# Patient Record
Sex: Female | Born: 1940 | Hispanic: No | Marital: Married | Smoking: Former smoker
Health system: Southern US, Community
[De-identification: ages and names within clinical notes are randomized; demographics above are authoritative.]

## PROBLEM LIST (undated history)

## (undated) ENCOUNTER — Emergency Department (HOSPITAL_BASED_OUTPATIENT_CLINIC_OR_DEPARTMENT_OTHER): Admission: EM

## (undated) DIAGNOSIS — J189 Pneumonia, unspecified organism: Secondary | ICD-10-CM

## (undated) DIAGNOSIS — D649 Anemia, unspecified: Secondary | ICD-10-CM

## (undated) DIAGNOSIS — J449 Chronic obstructive pulmonary disease, unspecified: Secondary | ICD-10-CM

## (undated) DIAGNOSIS — C801 Malignant (primary) neoplasm, unspecified: Secondary | ICD-10-CM

## (undated) DIAGNOSIS — F329 Major depressive disorder, single episode, unspecified: Secondary | ICD-10-CM

## (undated) DIAGNOSIS — F419 Anxiety disorder, unspecified: Secondary | ICD-10-CM

## (undated) DIAGNOSIS — I509 Heart failure, unspecified: Secondary | ICD-10-CM

## (undated) DIAGNOSIS — R011 Cardiac murmur, unspecified: Secondary | ICD-10-CM

## (undated) DIAGNOSIS — Z8639 Personal history of other endocrine, nutritional and metabolic disease: Secondary | ICD-10-CM

## (undated) DIAGNOSIS — E039 Hypothyroidism, unspecified: Secondary | ICD-10-CM

## (undated) DIAGNOSIS — K5000 Crohn's disease of small intestine without complications: Secondary | ICD-10-CM

## (undated) DIAGNOSIS — I10 Essential (primary) hypertension: Secondary | ICD-10-CM

## (undated) DIAGNOSIS — I499 Cardiac arrhythmia, unspecified: Secondary | ICD-10-CM

## (undated) DIAGNOSIS — M24451 Recurrent dislocation, right hip: Secondary | ICD-10-CM

## (undated) DIAGNOSIS — M1990 Unspecified osteoarthritis, unspecified site: Secondary | ICD-10-CM

## (undated) DIAGNOSIS — E538 Deficiency of other specified B group vitamins: Secondary | ICD-10-CM

## (undated) HISTORY — DX: Recurrent dislocation, right hip: M24.451

## (undated) HISTORY — DX: Crohn's disease of small intestine without complications: K50.00

## (undated) HISTORY — PX: TUBAL LIGATION: SHX77

## (undated) HISTORY — DX: Deficiency of other specified B group vitamins: E53.8

## (undated) HISTORY — DX: Anxiety disorder, unspecified: F41.9

## (undated) HISTORY — PX: JOINT REPLACEMENT: SHX530

## (undated) HISTORY — PX: TONSILLECTOMY: SUR1361

## (undated) HISTORY — DX: Chronic obstructive pulmonary disease, unspecified: J44.9

## (undated) HISTORY — DX: Major depressive disorder, single episode, unspecified: F32.9

## (undated) HISTORY — DX: Personal history of other endocrine, nutritional and metabolic disease: Z86.39

## (undated) HISTORY — DX: Anemia, unspecified: D64.9

---

## 2008-08-04 DIAGNOSIS — Z8639 Personal history of other endocrine, nutritional and metabolic disease: Secondary | ICD-10-CM

## 2008-08-04 HISTORY — DX: Personal history of other endocrine, nutritional and metabolic disease: Z86.39

## 2009-02-19 DIAGNOSIS — K5000 Crohn's disease of small intestine without complications: Secondary | ICD-10-CM

## 2009-02-19 HISTORY — DX: Crohn's disease of small intestine without complications: K50.00

## 2013-10-17 DIAGNOSIS — F32A Depression, unspecified: Secondary | ICD-10-CM

## 2013-10-17 HISTORY — DX: Depression, unspecified: F32.A

## 2015-05-18 DIAGNOSIS — E538 Deficiency of other specified B group vitamins: Secondary | ICD-10-CM

## 2015-05-18 HISTORY — DX: Deficiency of other specified B group vitamins: E53.8

## 2016-06-03 HISTORY — PX: LAPAROSCOPY: SHX197

## 2016-07-13 ENCOUNTER — Ambulatory Visit (INDEPENDENT_AMBULATORY_CARE_PROVIDER_SITE_OTHER): Payer: Medicare Other | Admitting: Family Medicine

## 2016-07-13 ENCOUNTER — Encounter: Payer: Self-pay | Admitting: Family Medicine

## 2016-07-13 VITALS — BP 140/70 | HR 115 | Resp 12 | Ht 61.25 in | Wt 97.2 lb

## 2016-07-13 DIAGNOSIS — K922 Gastrointestinal hemorrhage, unspecified: Secondary | ICD-10-CM | POA: Diagnosis not present

## 2016-07-13 DIAGNOSIS — R109 Unspecified abdominal pain: Secondary | ICD-10-CM

## 2016-07-13 DIAGNOSIS — G894 Chronic pain syndrome: Secondary | ICD-10-CM

## 2016-07-13 DIAGNOSIS — F17200 Nicotine dependence, unspecified, uncomplicated: Secondary | ICD-10-CM | POA: Insufficient documentation

## 2016-07-13 DIAGNOSIS — K279 Peptic ulcer, site unspecified, unspecified as acute or chronic, without hemorrhage or perforation: Secondary | ICD-10-CM | POA: Diagnosis not present

## 2016-07-13 DIAGNOSIS — J449 Chronic obstructive pulmonary disease, unspecified: Secondary | ICD-10-CM

## 2016-07-13 DIAGNOSIS — F419 Anxiety disorder, unspecified: Secondary | ICD-10-CM

## 2016-07-13 DIAGNOSIS — G621 Alcoholic polyneuropathy: Secondary | ICD-10-CM

## 2016-07-13 DIAGNOSIS — F17218 Nicotine dependence, cigarettes, with other nicotine-induced disorders: Secondary | ICD-10-CM

## 2016-07-13 HISTORY — DX: Nicotine dependence, unspecified, uncomplicated: F17.200

## 2016-07-13 HISTORY — DX: Peptic ulcer, site unspecified, unspecified as acute or chronic, without hemorrhage or perforation: K27.9

## 2016-07-13 HISTORY — DX: Anxiety disorder, unspecified: F41.9

## 2016-07-13 HISTORY — DX: Chronic obstructive pulmonary disease, unspecified: J44.9

## 2016-07-13 LAB — CBC
HEMATOCRIT: 28.1 % — AB (ref 36.0–46.0)
HEMOGLOBIN: 9.6 g/dL — AB (ref 12.0–15.0)
MCHC: 34.2 g/dL (ref 30.0–36.0)
MCV: 102 fl — ABNORMAL HIGH (ref 78.0–100.0)
Platelets: 269 10*3/uL (ref 150.0–400.0)
RBC: 2.76 Mil/uL — AB (ref 3.87–5.11)
RDW: 17 % — ABNORMAL HIGH (ref 11.5–15.5)
WBC: 6.1 10*3/uL (ref 4.0–10.5)

## 2016-07-13 LAB — COMPREHENSIVE METABOLIC PANEL
ALBUMIN: 3.7 g/dL (ref 3.5–5.2)
ALK PHOS: 73 U/L (ref 39–117)
ALT: 11 U/L (ref 0–35)
AST: 23 U/L (ref 0–37)
BILIRUBIN TOTAL: 0.3 mg/dL (ref 0.2–1.2)
BUN: 11 mg/dL (ref 6–23)
CO2: 27 mEq/L (ref 19–32)
Calcium: 8.9 mg/dL (ref 8.4–10.5)
Chloride: 105 mEq/L (ref 96–112)
Creatinine, Ser: 0.7 mg/dL (ref 0.40–1.20)
GFR: 86.37 mL/min (ref >60.00)
Glucose, Bld: 115 mg/dL — ABNORMAL HIGH (ref 70–99)
POTASSIUM: 4.4 meq/L (ref 3.5–5.1)
Sodium: 139 mEq/L (ref 135–145)
TOTAL PROTEIN: 6.3 g/dL (ref 6.0–8.3)

## 2016-07-13 MED ORDER — HYDROCODONE-ACETAMINOPHEN 5-325 MG PO TABS: 1.0000 | ORAL_TABLET | Freq: Every evening | ORAL | 0 refills | Status: DC | PRN
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
## 2016-07-13 NOTE — Patient Instructions (Signed)
A few things to remember from today's visit:   Upper GI bleed - Plan: CBC, Comprehensive metabolic panel, Ambulatory referral to Gastroenterology, HYDROcodone-acetaminophen (NORCO/VICODIN) 5-325 MG tablet  Abdominal pain, unspecified abdominal location - Plan: Comprehensive metabolic panel, HYDROcodone-acetaminophen (NORCO/VICODIN) 5-325 MG tablet  Fall precautions because unstable walk and medication.    Follow a bland diet for the next few days, small meals, adequate hydration.   GET HELP RIGHT AWAY IF:   The pain is does not go away within 2 hours.  Sudden severe/worsening pain.  You keep throwing up (vomiting).  The pain changes and is only in the right or left part of the belly.  Not being able to pass gas or poop.  You have bloody or tarry looking poop.   MAKE SURE YOU:   Understand these instructions.  Will watch your condition.  Will get help right away if you are not doing well or get worse.   If symptoms are persistent please arrange a follow up appointment.     Please be sure medication list is accurate. If a new problem present, please set up appointment sooner than planned today.

## 2016-07-14 ENCOUNTER — Encounter: Payer: Self-pay | Admitting: Gastroenterology

## 2016-07-17 ENCOUNTER — Encounter: Payer: Self-pay | Admitting: Family Medicine

## 2016-07-17 DIAGNOSIS — G894 Chronic pain syndrome: Secondary | ICD-10-CM | POA: Insufficient documentation

## 2016-07-17 DIAGNOSIS — G621 Alcoholic polyneuropathy: Secondary | ICD-10-CM | POA: Insufficient documentation

## 2016-07-17 HISTORY — DX: Chronic pain syndrome: G89.4

## 2016-07-26 NOTE — Progress Notes (Signed)
HPI:   Kelly [name] is a 76 y.o. female, who is here today to follow on recent OV.   She was seen on 07/13/16, when she was c/o severe RUQ pain that seemed to be radiated from and to right low back.  After reviewing records I did referred her to pain clinic. She has Hx of chronic pain and has been on chronic opioid treatment. She has not received information about pain management appt.  Hx of depression and alcoholisms.  Last OV she was reporting melena. GI referral placed.She has an appt in 08/2016 but calling daily to see if there is any cancellation. She had melena until 07/18/16. Diarrhea resolved. Having formed stools , small,2-3 times per day.  Denies fever,chills,nausea, vomiting, or red blood in stool. She is not taking iron supplementation.   Lab Results  Component Value Date   WBC 6.1 07/13/2016   HGB 9.6 (L) 07/13/2016   HCT 28.1 (L) 07/13/2016   MCV 102.0 (H) 07/13/2016   PLT 269.0 07/13/2016   Denies headache, visual changes, chest pain, dyspnea, palpitation,focal weakness.  Abdominal pain, RUQ, now 5/10 when she takes Hydrocodone-Acetaminophen, which she is taking at bedtime. She is eating better and has noted some wt. She is cooking some and playing with her grandchildren. Still taking Gabapentin 300 mg up to 5 caps daily. Denies side effects from medications.    She stopped alcohol intake. Continues smoking, trying to quit but she feels like "it is very hard" to do so.She is still using nicotine patches.  Overall she is feeling better.  Today she is c/o LE edema, which has been intermittently for at least a year. Ankle achy pain. It seems to be "a little" better in the morning and worse I the afternoon. She denies decreased urine output, gross hematuria,foam in urine, orthopnea,or PND.  She has tried soaking feet in warm water with epsom salt.    Lab Results  Component Value Date   CREATININE 0.70 07/13/2016   BUN 11 07/13/2016   NA  139 07/13/2016   K 4.4 07/13/2016   CL 105 07/13/2016   CO2 27 07/13/2016   Lab Results  Component Value Date   ALT 11 07/13/2016   AST 23 07/13/2016   ALKPHOS 73 07/13/2016   BILITOT 0.3 07/13/2016    Review of Systems  Constitutional: Positive for fatigue (improved.). Negative for chills, diaphoresis and fever.  HENT: Negative for facial swelling, mouth sores, nosebleeds, sore throat and trouble swallowing.   Respiratory: Negative for cough, shortness of breath and wheezing.   Cardiovascular: Positive for leg swelling. Negative for chest pain and palpitations.  Gastrointestinal: Positive for abdominal pain. Negative for blood in stool, nausea and vomiting.  Genitourinary: Negative for decreased urine volume, dysuria and hematuria.  Musculoskeletal: Positive for back pain and gait problem.  Neurological: Negative for syncope, weakness and headaches.  Hematological: Negative for adenopathy. Does not bruise/bleed easily.  Psychiatric/Behavioral: Negative for confusion. The patient is nervous/anxious.       Current Outpatient Prescriptions on File Prior to Visit  Medication Sig Dispense Refill  . atorvastatin (LIPITOR) 20 MG tablet Take 20 mg by mouth daily.    Marland Kitchen b complex vitamins tablet Take 1 tablet by mouth daily.    . Cholecalciferol (VITAMIN D3) 2000 units TABS Take 1 tablet by mouth daily.    . Multiple Vitamin (MULTIVITAMIN) tablet Take 1 tablet by mouth daily.    . nicotine (NICODERM CQ - DOSED IN MG/24 HOURS)  14 mg/24hr patch Place 14 mg onto the skin daily.    Marland Kitchen PARoxetine (PAXIL) 20 MG tablet Take 20 mg by mouth daily.     No current facility-administered medications on file prior to visit.      Past Medical History:  Diagnosis Date  . Anemia   . Anxiety   . B12 deficiency 05/2015   B12 was 184  . COPD (chronic obstructive pulmonary disease) ([place])   . Crohn's disease, small intestine ([place]) 02/19/2009  . Depression 10/2013   chronic recurrent major  depressive disorder  . H/O vitamin D deficiency 08/04/2008  . Recurrent dislocation of hip, right    Allergies  Allergen Reactions  . Bactrim [Sulfamethoxazole-Trimethoprim]   . Bee Venom     Social History   Social History  . Marital status: Married    Spouse name: N/A  . Number of children: N/A  . Years of education: N/A   Social History Main Topics  . Smoking status: Current Every Day Smoker  . Smokeless tobacco: Never Used  . Alcohol use Yes  . Drug use: No  . Sexual activity: Not Currently   Other Topics Concern  . None   Social History Narrative  . None    Vitals:   07/27/16 1203  BP: 120/74  Pulse: 94  Resp: 12  O2 sat at RA 96% Body mass index is 19.7 kg/m. Wt Readings from Last 3 Encounters:  07/27/16 105 lb 2 oz (47.7 kg)  07/13/16 97 lb 4 oz (44.1 kg)     Physical Exam  Nursing note and vitals reviewed. Constitutional: She is oriented to person, place, and time. She appears well-developed. No distress.  HENT:  Head: Atraumatic.  Mouth/Throat: Uvula is midline, oropharynx is clear and moist and mucous membranes are normal. Abnormal dentition.  Eyes: Conjunctivae and EOM are normal. Pupils are equal, round, and reactive to light.  Cardiovascular: Regular rhythm.  Tachycardia present.   Murmur (SEM II/VI RUSB > LUSB) heard. Pulses:      Dorsalis pedis pulses are 2+ on the right side, and 2+ on the left side.  Respiratory: Effort normal and breath sounds normal. No respiratory distress.  GI: Soft. She exhibits no distension, no ascites and no mass. There is no hepatomegaly. There is tenderness in the right upper quadrant, epigastric area, periumbilical area and left upper quadrant. There is no rigidity, no rebound and no guarding.  Musculoskeletal: She exhibits edema (2+ pitting LE edema, 3+ pedal pitting edema,bilateral.).  Lymphadenopathy:    She has no cervical adenopathy.  Neurological: She is alert and oriented to person, place, and time. She  has normal strength.  Mildly unstable gait with no assistance.  Skin: Skin is warm. No rash noted. No erythema.  Psychiatric: Her mood appears anxious. She expresses no suicidal ideation.  Poor groomed, good eye contact.    ASSESSMENT AND PLAN:   Ms. Jonica was seen today for follow-up.  Diagnoses and all orders for this visit:  PUD (peptic ulcer disease)  Instructed to keep appt with GI. No changes in Omeprazole. GERD precautions to continue.  -     omeprazole (PRILOSEC) 20 MG capsule; Take 1 capsule (20 mg total) by mouth 2 (two) times daily before a meal.  Upper GI bleed  She is not longer having melenas. Continue PPI. Avoid NSAID's. Instructed about warning signs.  -     CBC  Abdominal pain, unspecified abdominal location  Improved with Hydrocodone-Acetaminophen, educated about side effects. On examination she  still has moderate pain on flanks and upper abdomen, no signs of acute abdomen. LUQ and flank pain are chronic. Pain management appt is pending. She will continue Hydrocodone-Acetaminophen 5-325 mg at bedtime and recommend trying q 2 days. Keep appt with GI.  Instructed about warning signs.  -     HYDROcodone-acetaminophen (NORCO/VICODIN) 5-325 MG tablet; Take 1 tablet by mouth at bedtime as needed for moderate pain.  Bilateral lower extremity edema  Possible etiologies discussed: Vein disease, cardiac,renal,med side effects,and hypoproteinemia among some. She denies dyspnea,orthopnea,or PND. Renal function in normal range last OV and well as protein level. ? Venous etiology, warm water may aggravate problem. Recommend elevation of LE above heart level a few times per day,low salt diet,and compression stoking. Skin care. Instructed about warning signs.   At this time I am still waiting for records. Not aware of Hx of valvular disease and not asymptomatic in this regard, may recommend Echo if I still do not have records next OV. Next OV B12, BMP, and  CBC.      Betty G. Martinique, MD  [employer] [place] office.

## 2016-07-27 ENCOUNTER — Ambulatory Visit (INDEPENDENT_AMBULATORY_CARE_PROVIDER_SITE_OTHER): Payer: Medicare Other | Admitting: Family Medicine

## 2016-07-27 ENCOUNTER — Encounter: Payer: Self-pay | Admitting: Family Medicine

## 2016-07-27 VITALS — BP 120/74 | HR 94 | Resp 12 | Ht 61.25 in | Wt 105.1 lb

## 2016-07-27 DIAGNOSIS — R109 Unspecified abdominal pain: Secondary | ICD-10-CM | POA: Diagnosis not present

## 2016-07-27 DIAGNOSIS — K922 Gastrointestinal hemorrhage, unspecified: Secondary | ICD-10-CM

## 2016-07-27 DIAGNOSIS — G894 Chronic pain syndrome: Secondary | ICD-10-CM

## 2016-07-27 DIAGNOSIS — R600 Localized edema: Secondary | ICD-10-CM

## 2016-07-27 DIAGNOSIS — K279 Peptic ulcer, site unspecified, unspecified as acute or chronic, without hemorrhage or perforation: Secondary | ICD-10-CM | POA: Diagnosis not present

## 2016-07-27 LAB — CBC
HEMATOCRIT: 27.1 % — AB (ref 36.0–46.0)
Hemoglobin: 9 g/dL — ABNORMAL LOW (ref 12.0–15.0)
MCHC: 33.2 g/dL (ref 30.0–36.0)
MCV: 99.5 fl (ref 78.0–100.0)
Platelets: 368 10*3/uL (ref 150.0–400.0)
RBC: 2.72 Mil/uL — ABNORMAL LOW (ref 3.87–5.11)
RDW: 16.8 % — AB (ref 11.5–15.5)
WBC: 6.6 10*3/uL (ref 4.0–10.5)

## 2016-07-27 MED ORDER — OMEPRAZOLE 20 MG PO CPDR: 20.0000 mg | DELAYED_RELEASE_CAPSULE | Freq: Two times a day (BID) | ORAL | 2 refills | Status: DC

## 2016-07-27 MED ORDER — HYDROCODONE-ACETAMINOPHEN 5-325 MG PO TABS: 1.0000 | ORAL_TABLET | Freq: Every evening | ORAL | 0 refills | Status: AC | PRN

## 2016-07-27 NOTE — Patient Instructions (Addendum)
A few things to remember from today's visit:   Chronic pain disorder  PUD (peptic ulcer disease)  Upper GI bleed - Plan: HYDROcodone-acetaminophen (NORCO/VICODIN) 5-325 MG tablet  Abdominal pain, unspecified abdominal location - Plan: HYDROcodone-acetaminophen (NORCO/VICODIN) 5-325 MG tablet  Bilateral lower extremity edema  Continue Omeprazole for now. Keep appt with gastro.  Pending evaluation by pain management. Try to take Hydrocodone q 2 days.  Avoid Ibuprofen or alive or Motrin because can increase risk of bleeding.   Swelling of legs can be related to vein disease. Vein disease is a condition that can affect the veins in the legs. It can cause leg pain, varicose veins, swollen legs, or open sores. Varicose veins are swollen and twisted veins. Things that may help: leg exercises (ankle flexion, walking),compression stocking, OTC horse chestnut seed extract 300 mg twice daily,if  itchy skin cortisone and moisturizers.  Compression stockings- [facility] in [place]  Please be sure medication list is accurate. If a new problem present, please set up appointment sooner than planned today.

## 2016-08-02 ENCOUNTER — Ambulatory Visit (INDEPENDENT_AMBULATORY_CARE_PROVIDER_SITE_OTHER): Payer: Medicare Other | Admitting: Family Medicine

## 2016-08-02 ENCOUNTER — Encounter: Payer: Self-pay | Admitting: Family Medicine

## 2016-08-02 VITALS — BP 126/70 | HR 101 | Resp 16 | Ht 61.25 in | Wt 109.2 lb

## 2016-08-02 DIAGNOSIS — R351 Nocturia: Secondary | ICD-10-CM | POA: Diagnosis not present

## 2016-08-02 DIAGNOSIS — R600 Localized edema: Secondary | ICD-10-CM | POA: Diagnosis not present

## 2016-08-02 DIAGNOSIS — R011 Cardiac murmur, unspecified: Secondary | ICD-10-CM

## 2016-08-02 MED ORDER — FERROUS SULFATE 325 (65 FE) MG PO TBEC: 325.0000 mg | DELAYED_RELEASE_TABLET | Freq: Every day | ORAL | 1 refills | Status: AC

## 2016-08-02 MED ORDER — FUROSEMIDE 20 MG PO TABS: 20.0000 mg | ORAL_TABLET | Freq: Every day | ORAL | 1 refills | Status: DC

## 2016-08-02 NOTE — Progress Notes (Signed)
ACUTE VISIT   HPI:  Chief Complaint  Patient presents with  . Leg Swelling    [name] is a 76 y.o. female, who is here today complaining of persistent lower extremity edema, she is soaking lower extremities in ice water and elevating them a few times during the day but it doesn't seem to be helping. She has hx of intermittent LE edema, seems to be worse since she moved to [place]. She denies legs erythema or calf localized pain. She has bilateral LE pain, burning like sensation.  She is also reporting nocturia, 6 times at night for the past 3-4 days, this is a new symptom for her. She doesn't have any urinary symptoms or frequency during the day.   Other  This is a chronic problem. The problem occurs intermittently. The problem has been unchanged. Associated symptoms include abdominal pain (stable.), arthralgias and fatigue. Pertinent negatives include no change in bowel habit, chest pain, chills, coughing, diaphoresis, fever, headaches, nausea, rash, sore throat, urinary symptoms, vomiting or weakness. The symptoms are aggravated by walking and standing. Treatments tried: elevation of LE's. The treatment provided no relief.   Lab Results  Component Value Date   CREATININE 0.70 07/13/2016   BUN 11 07/13/2016   NA 139 07/13/2016   K 4.4 07/13/2016   CL 105 07/13/2016   CO2 27 07/13/2016   I have heard heart murmur since her first visit, she reports prior Hx but does not recall having a echo before.  She denies fever,chills, chest pain, dyspnea, palpitations, orthopnea and PND.    Review of Systems  Constitutional: Positive for fatigue. Negative for chills, diaphoresis and fever.  HENT: Negative for facial swelling, mouth sores, nosebleeds, sore throat and trouble swallowing.   Respiratory: Negative for cough, shortness of breath and wheezing.   Cardiovascular: Positive for leg swelling. Negative for chest pain and palpitations.  Gastrointestinal:  Positive for abdominal pain (stable.). Negative for change in bowel habit, nausea and vomiting.  Endocrine: Negative for cold intolerance and heat intolerance.  Genitourinary: Negative for decreased urine volume, dysuria and hematuria.  Musculoskeletal: Positive for arthralgias, back pain and gait problem.  Skin: Negative for rash.  Neurological: Negative for syncope, weakness and headaches.      Current Outpatient Prescriptions on File Prior to Visit  Medication Sig Dispense Refill  . atorvastatin (LIPITOR) 20 MG tablet Take 20 mg by mouth daily.    Marland Kitchen b complex vitamins tablet Take 1 tablet by mouth daily.    . Cholecalciferol (VITAMIN D3) 2000 units TABS Take 1 tablet by mouth daily.    Marland Kitchen gabapentin (NEURONTIN) 300 MG capsule Take 300 mg by mouth 5 (five) times daily.    Marland Kitchen HYDROcodone-acetaminophen (NORCO/VICODIN) 5-325 MG tablet Take 1 tablet by mouth at bedtime as needed for moderate pain. 30 tablet 0  . Multiple Vitamin (MULTIVITAMIN) tablet Take 1 tablet by mouth daily.    . nicotine (NICODERM CQ - DOSED IN MG/24 HOURS) 14 mg/24hr patch Place 14 mg onto the skin daily.    Marland Kitchen omeprazole (PRILOSEC) 20 MG capsule Take 1 capsule (20 mg total) by mouth 2 (two) times daily before a meal. 60 capsule 2  . PARoxetine (PAXIL) 20 MG tablet Take 20 mg by mouth daily.     No current facility-administered medications on file prior to visit.      Past Medical History:  Diagnosis Date  . Anemia   . Anxiety   . B12 deficiency 05/2015  B12 was 184  . COPD (chronic obstructive pulmonary disease) ([place])   . Crohn's disease, small intestine ([place]) 02/19/2009  . Depression 10/2013   chronic recurrent major depressive disorder  . H/O vitamin D deficiency 08/04/2008  . Recurrent dislocation of hip, right    Allergies  Allergen Reactions  . Bactrim [Sulfamethoxazole-Trimethoprim]   . Bee Venom     Social History   Social History  . Marital status: Married    Spouse name: N/A  . Number of  children: N/A  . Years of education: N/A   Social History Main Topics  . Smoking status: Current Every Day Smoker  . Smokeless tobacco: Never Used  . Alcohol use Yes  . Drug use: No  . Sexual activity: Not Currently   Other Topics Concern  . None   Social History Narrative  . None    Vitals:   08/02/16 1446  BP: 126/70  Pulse: (!) 101  Resp: 16  O2 sat at RA 96% Body mass index is 20.47 kg/m.   Physical Exam  Nursing note and vitals reviewed. Constitutional: She is oriented to person, place, and time. She appears well-developed. No distress.  HENT:  Head: Normocephalic and atraumatic.  Mouth/Throat: Oropharynx is clear and moist and mucous membranes are normal.  Poor dentition.  Eyes: Pupils are equal, round, and reactive to light. Conjunctivae are normal.  Neck: No JVD present.  Cardiovascular: Regular rhythm.  Tachycardia present.   Murmur (SEM II-II RUSB and LUSB.) heard. Pulses:      Dorsalis pedis pulses are 2+ on the right side, and 2+ on the left side.  Holman's sign negative bilateral.  Respiratory: Effort normal and breath sounds normal. No respiratory distress.  GI: Soft. She exhibits no mass. There is no hepatomegaly. There is tenderness. There is no rebound and no guarding.  Musculoskeletal: She exhibits edema (2+ pedal and pretibial LE edema, bilateral,symmetric.) and tenderness (Upon pressing pretibial area.).  Lymphadenopathy:    She has no cervical adenopathy.  Neurological: She is alert and oriented to person, place, and time. She has normal strength. Coordination normal.  Otherwise stable gait with no assistance.  Skin: Skin is warm. No rash noted. No erythema.  Psychiatric: Her mood appears anxious.  Appropriately groomed, good eye contact.    ASSESSMENT AND PLAN:   Ms. Kelly [name] was seen today for leg swelling.  Diagnoses and all orders for this visit:  Bilateral lower extremity edema  We reviewed possible etiologies, because new onset  nocturia BNP was ordered today. ? Vein related. Some side effects of furosemide discussed. Instructed about warning signs. Follow-up in 2 weeks.  -     B Nat Peptide -     furosemide (LASIX) 20 MG tablet; Take 1 tablet (20 mg total) by mouth daily. -     ECHOCARDIOGRAM COMPLETE; Future -     TSH  Heart murmur  Reporting no symptoms and no prior work-up. I have not received records from former PCP. EKG today SR, normal axis, atrial enlargement. I'll have a prior EKG for comparison. Echo will be arranged.  -     EKG 12-Lead -     B Nat Peptide -     ECHOCARDIOGRAM COMPLETE; Future  Nocturia  No other urinary symptom, possible causes discussed.  I do not think U/A is necessary today since she is not having other urinary symptoms including dysuria and gross hematuria.   -     B Nat Peptide -  ECHOCARDIOGRAM COMPLETE; Future  Other orders  We discussed recent CBC, we have not been able to reach her by phone.  -     ferrous sulfate 325 (65 FE) MG EC tablet; Take 1 tablet (325 mg total) by mouth daily with breakfast.    -[name] was advised to seek immediate medical attention if sudden worsening symptoms or to follow sooner if they persist or if new concerns arise.       Kelly [name] G. Martinique, MD  [facility]. [place] office.

## 2016-08-02 NOTE — Patient Instructions (Addendum)
A few things to remember from today's visit:   Heart murmur - Plan: EKG 12-Lead, B Nat Peptide, ECHOCARDIOGRAM COMPLETE  Bilateral lower extremity edema - Plan: B Nat Peptide, furosemide (LASIX) 20 MG tablet, ECHOCARDIOGRAM COMPLETE  Nocturia - Plan: B Nat Peptide, ECHOCARDIOGRAM COMPLETE  Take 2 tabs daily of Furosemide for 4-5 days then one daily.  Eat food that has potassium.   Please be sure medication list is accurate. If a new problem present, please set up appointment sooner than planned today.

## 2016-08-03 LAB — BRAIN NATRIURETIC PEPTIDE: Pro B Natriuretic peptide (BNP): 331 pg/mL — ABNORMAL HIGH (ref 0.0–100.0)

## 2016-08-03 LAB — TSH: TSH: 5.3 u[IU]/mL — ABNORMAL HIGH (ref 0.35–4.50)

## 2016-08-15 DIAGNOSIS — D509 Iron deficiency anemia, unspecified: Secondary | ICD-10-CM

## 2016-08-15 DIAGNOSIS — E538 Deficiency of other specified B group vitamins: Secondary | ICD-10-CM | POA: Insufficient documentation

## 2016-08-15 HISTORY — DX: Iron deficiency anemia, unspecified: D50.9

## 2016-08-15 NOTE — Progress Notes (Addendum)
HPI:   [name] is a 77 y.o. female, who is here today to follow on recent OV.   She was seen on 08/02/16, when she was c/o worsening LE edema. Thought to be venous in etiology. She is on Furosemide 20 mg daily, which she has tolerated well except for increased urinary frequency and urgency.  Last she denied dyspnea, today she states that she has been helping more around her daughter's house, so she has noted some exertional dyspnea and has needed her albuterol inhaler a few times during the past week. She denies associated chest pain, diaphoresis, dizziness, palpitations, or syncope. She denies orthopnea or PND. Hx of COPD, she is on Arnuity Ellipta 200 mcg daily prn.  Lower extremity edema has improved but still having pain. She has not noted erythema or skin ulcers.   Echo was ordered but she missed call for appointment information.  08/03/2015 BNP mildly above normal range, 331.   + Smoker.  Lab Results  Component Value Date   CREATININE 0.70 07/13/2016   BUN 11 07/13/2016   NA 139 07/13/2016   K 4.4 07/13/2016   CL 105 07/13/2016   CO2 27 07/13/2016   TSH mildly abnormal. + Fatigue, chronic.   Lab Results  Component Value Date   TSH 5.30 (H) 08/02/2016    Hx of iron deficiency anemia and B12 deficiency. She is on Fe Sulfate 325 mg daily. She is no longer having melena, she has not noted blood in stool.  Abdominal pain is stable.  Lab Results  Component Value Date   WBC 6.6 07/27/2016   HGB 9.0 (L) 07/27/2016   HCT 27.1 (L) 07/27/2016   MCV 99.5 07/27/2016   PLT 368.0 07/27/2016   She also has Hx of B12 deficiency. 05/2015 B12 187. She is on B complex vit.  She denies history of alcohol abuse, which was listed on some of her records. She is drinking vodka, about 1 oz daily.   Review of Systems  Constitutional: Positive for fatigue. Negative for activity change, appetite change and fever.  HENT: Negative for mouth sores, nosebleeds  and trouble swallowing.   Respiratory: Positive for shortness of breath. Negative for cough and wheezing.   Cardiovascular: Positive for leg swelling. Negative for chest pain and palpitations.  Gastrointestinal: Positive for abdominal pain. Negative for blood in stool, diarrhea, nausea and vomiting.  Endocrine: Negative for cold intolerance and heat intolerance.  Genitourinary: Negative for decreased urine volume, dysuria and hematuria.  Musculoskeletal: Negative for gait problem.  Skin: Negative for rash.  Neurological: Negative for syncope, weakness and headaches.  Hematological: Negative for adenopathy. Does not bruise/bleed easily.  Psychiatric/Behavioral: Negative for confusion. The patient is nervous/anxious.     Current Outpatient Prescriptions on File Prior to Visit  Medication Sig Dispense Refill  . atorvastatin (LIPITOR) 20 MG tablet Take 20 mg by mouth daily.    Marland Kitchen b complex vitamins tablet Take 1 tablet by mouth daily.    . Cholecalciferol (VITAMIN D3) 2000 units TABS Take 1 tablet by mouth daily.    . ferrous sulfate 325 (65 FE) MG EC tablet Take 1 tablet (325 mg total) by mouth daily with breakfast. 90 tablet 1  . furosemide (LASIX) 20 MG tablet Take 1 tablet (20 mg total) by mouth daily. 30 tablet 1  . gabapentin (NEURONTIN) 300 MG capsule Take 300 mg by mouth 5 (five) times daily.    Marland Kitchen HYDROcodone-acetaminophen (NORCO/VICODIN) 5-325 MG tablet Take 1 tablet by  mouth at bedtime as needed for moderate pain. 30 tablet 0  . Multiple Vitamin (MULTIVITAMIN) tablet Take 1 tablet by mouth daily.    . nicotine (NICODERM CQ - DOSED IN MG/24 HOURS) 14 mg/24hr patch Place 14 mg onto the skin daily.    Marland Kitchen omeprazole (PRILOSEC) 20 MG capsule Take 1 capsule (20 mg total) by mouth 2 (two) times daily before a meal. 60 capsule 2  . PARoxetine (PAXIL) 20 MG tablet Take 20 mg by mouth daily.     No current facility-administered medications on file prior to visit.      Past Medical History:    Diagnosis Date  . Anemia   . Anxiety   . B12 deficiency 05/2015   B12 was 184  . COPD (chronic obstructive pulmonary disease) ([place])   . Crohn's disease, small intestine ([place]) 02/19/2009  . Depression 10/2013   chronic recurrent major depressive disorder  . H/O vitamin D deficiency 08/04/2008  . Recurrent dislocation of hip, right    Allergies  Allergen Reactions  . Bactrim [Sulfamethoxazole-Trimethoprim]   . Bee Venom     Social History   Social History  . Marital status: Married    Spouse name: N/A  . Number of children: N/A  . Years of education: N/A   Social History Main Topics  . Smoking status: Current Every Day Smoker  . Smokeless tobacco: Never Used  . Alcohol use Yes  . Drug use: No  . Sexual activity: Not Currently   Other Topics Concern  . None   Social History Narrative  . None    Vitals:   08/16/16 1012 08/16/16 1042  BP: 118/62   Pulse: (!) 105 100  Resp: 16    Body mass index is 19.14 kg/m.  Wt Readings from Last 3 Encounters:  08/16/16 102 lb 2 oz (46.3 kg)  08/02/16 109 lb 4 oz (49.6 kg)  07/27/16 105 lb 2 oz (47.7 kg)    Physical Exam  Nursing note and vitals reviewed. Constitutional: She is oriented to person, place, and time. She appears well-developed. No distress.  HENT:  Head: Normocephalic and atraumatic.  Mouth/Throat: Oropharynx is clear and moist and mucous membranes are normal.  Eyes: Pupils are equal, round, and reactive to light. Conjunctivae are normal.  Neck: No JVD present.  Cardiovascular: Normal rate and regular rhythm.   Murmur (SEM I-II RUSB and LUSB.) heard. Pulses:      Dorsalis pedis pulses are 2+ on the right side, and 2+ on the left side.  Small varicose veins LE, bilateral   Respiratory: Effort normal. No respiratory distress. She has no wheezes. She has rales (left apex and lower lobe).  GI: Soft. She exhibits no mass. There is no hepatomegaly. There is generalized tenderness. There is no rigidity and  no rebound.  Musculoskeletal: She exhibits edema (1+ pedal and pretibial LE edema, bilateral).  Lymphadenopathy:    She has no cervical adenopathy.  Neurological: She is alert and oriented to person, place, and time. She has normal strength. Gait normal.  Skin: Skin is warm. No rash noted. No erythema.  Psychiatric: Her mood appears anxious.  Appropriately groomed, good eye contact.    ASSESSMENT AND PLAN:   Ms. Sharmel was seen today for follow-up.  Diagnoses and all orders for this visit:  Exertional dyspnea  ? COPD,CHF, anemia, and deconditioning among some discussed today. Instructed about warning signs. Instructed to call and reschedule echo, she has the phone number.  For now she will  continue with Arnuity Ellipta 200 mcg daily and Albuterol inh as needed.  -     Basic metabolic panel -     Brain Natriuretic Peptide  Bilateral lower extremity edema  Improved. For now she will continue Furosemide 20 mg daily. Low salt diet. Lower extremity elevation above heart level and/or compression stockings might also help. Keep appt in 09/2016.  -     Basic metabolic panel  Z61 deficiency  No changes in current management, will follow labs done today and will give further recommendations accordingly.  -     Vitamin B12  Iron deficiency anemia due to chronic blood loss  No changes in Fe Sulfate dose, will follow labs done today and will give further recommendations accordingly.  -     CBC  Chest rales  We discussed possible etiologies, including infectious, CHF, COPD among some. Strongly recommend smoking cessation.  -     DG Chest 2 View; Future  Abnormal TSH  Further recommendations would be given according to lab results.  -     TSH -     T4, free      Betty G. Martinique, MD  [facility]. [place] office.

## 2016-08-16 ENCOUNTER — Ambulatory Visit (INDEPENDENT_AMBULATORY_CARE_PROVIDER_SITE_OTHER): Payer: Medicare Other | Admitting: Family Medicine

## 2016-08-16 ENCOUNTER — Encounter: Payer: Self-pay | Admitting: Family Medicine

## 2016-08-16 VITALS — BP 118/62 | HR 100 | Resp 16 | Ht 61.25 in | Wt 102.1 lb

## 2016-08-16 DIAGNOSIS — R7989 Other specified abnormal findings of blood chemistry: Secondary | ICD-10-CM

## 2016-08-16 DIAGNOSIS — E538 Deficiency of other specified B group vitamins: Secondary | ICD-10-CM

## 2016-08-16 DIAGNOSIS — R0989 Other specified symptoms and signs involving the circulatory and respiratory systems: Secondary | ICD-10-CM

## 2016-08-16 DIAGNOSIS — R0609 Other forms of dyspnea: Secondary | ICD-10-CM | POA: Diagnosis not present

## 2016-08-16 DIAGNOSIS — D500 Iron deficiency anemia secondary to blood loss (chronic): Secondary | ICD-10-CM

## 2016-08-16 DIAGNOSIS — R600 Localized edema: Secondary | ICD-10-CM

## 2016-08-16 DIAGNOSIS — R946 Abnormal results of thyroid function studies: Secondary | ICD-10-CM | POA: Diagnosis not present

## 2016-08-16 LAB — CBC
HEMATOCRIT: 31.9 % — AB (ref 36.0–46.0)
HEMOGLOBIN: 10.3 g/dL — AB (ref 12.0–15.0)
MCHC: 32.3 g/dL (ref 30.0–36.0)
MCV: 99.8 fl (ref 78.0–100.0)
PLATELETS: 363 10*3/uL (ref 150.0–400.0)
RBC: 3.2 Mil/uL — ABNORMAL LOW (ref 3.87–5.11)
RDW: 18.5 % — AB (ref 11.5–15.5)
WBC: 7.2 10*3/uL (ref 4.0–10.5)

## 2016-08-16 LAB — BASIC METABOLIC PANEL
BUN: 12 mg/dL (ref 6–23)
CHLORIDE: 100 meq/L (ref 96–112)
CO2: 31 meq/L (ref 19–32)
Calcium: 8.7 mg/dL (ref 8.4–10.5)
Creatinine, Ser: 0.72 mg/dL (ref 0.40–1.20)
GFR: 83.59 mL/min (ref >60.00)
GLUCOSE: 105 mg/dL — AB (ref 70–99)
POTASSIUM: 3.7 meq/L (ref 3.5–5.1)
SODIUM: 139 meq/L (ref 135–145)

## 2016-08-16 LAB — T4, FREE: Free T4: 0.73 ng/dL (ref 0.60–1.60)

## 2016-08-16 LAB — TSH: TSH: 6.51 u[IU]/mL — ABNORMAL HIGH (ref 0.35–4.50)

## 2016-08-16 LAB — VITAMIN B12: Vitamin B-12: 176 pg/mL — ABNORMAL LOW (ref 211–911)

## 2016-08-16 LAB — BRAIN NATRIURETIC PEPTIDE: PRO B NATRI PEPTIDE: 285 pg/mL — AB (ref 0.0–100.0)

## 2016-08-16 MED ORDER — FLUTICASONE FUROATE 200 MCG/ACT IN AEPB: 1.0000 | INHALATION_SPRAY | Freq: Every day | RESPIRATORY_TRACT | 3 refills | Status: DC

## 2016-08-16 NOTE — Patient Instructions (Signed)
A few things to remember from today's visit:   Bilateral lower extremity edema - Plan: Basic metabolic panel  J88 deficiency - Plan: Vitamin B12  Iron deficiency anemia due to chronic blood loss - Plan: CBC  Chest rales - Plan: DG Chest 2 View  Exertional dyspnea - Plan: Basic metabolic panel, Brain Natriuretic Peptide  Abnormal TSH - Plan: TSH, T4, free  Please re-schedule echo.  Today X ray was ordered.  This can be done at [facility] between 8 am and 5 pm: [place]. [phone].   Please be sure medication list is accurate. If a new problem present, please set up appointment sooner than planned today.

## 2016-08-16 NOTE — Addendum Note (Signed)
Addended by: Martinique, BETTY G on: 08/16/2016 01:52 PM   Modules accepted: Orders

## 2016-08-22 ENCOUNTER — Ambulatory Visit (HOSPITAL_COMMUNITY): Payer: Medicare Other | Attending: Internal Medicine

## 2016-08-22 ENCOUNTER — Other Ambulatory Visit: Payer: Self-pay

## 2016-08-22 DIAGNOSIS — R600 Localized edema: Secondary | ICD-10-CM | POA: Diagnosis present

## 2016-08-22 DIAGNOSIS — F17200 Nicotine dependence, unspecified, uncomplicated: Secondary | ICD-10-CM | POA: Diagnosis not present

## 2016-08-22 DIAGNOSIS — J449 Chronic obstructive pulmonary disease, unspecified: Secondary | ICD-10-CM | POA: Insufficient documentation

## 2016-08-22 DIAGNOSIS — R011 Cardiac murmur, unspecified: Secondary | ICD-10-CM | POA: Insufficient documentation

## 2016-08-22 DIAGNOSIS — R351 Nocturia: Secondary | ICD-10-CM | POA: Diagnosis not present

## 2016-08-25 ENCOUNTER — Telehealth: Payer: Self-pay | Admitting: Family Medicine

## 2016-08-25 NOTE — Telephone Encounter (Signed)
Kelly [name] pt calling to get results

## 2016-08-26 NOTE — Telephone Encounter (Signed)
Left voicemail for patient to call the office back.  See result note.

## 2016-08-30 ENCOUNTER — Other Ambulatory Visit: Payer: Self-pay

## 2016-08-30 ENCOUNTER — Ambulatory Visit (INDEPENDENT_AMBULATORY_CARE_PROVIDER_SITE_OTHER): Payer: Medicare Other | Admitting: Gastroenterology

## 2016-08-30 ENCOUNTER — Encounter: Payer: Self-pay | Admitting: Gastroenterology

## 2016-08-30 ENCOUNTER — Other Ambulatory Visit: Payer: Self-pay | Admitting: Family Medicine

## 2016-08-30 ENCOUNTER — Other Ambulatory Visit: Payer: Medicare Other

## 2016-08-30 VITALS — BP 122/72 | HR 66 | Ht 62.0 in | Wt 104.0 lb

## 2016-08-30 DIAGNOSIS — I517 Cardiomegaly: Secondary | ICD-10-CM

## 2016-08-30 DIAGNOSIS — D519 Vitamin B12 deficiency anemia, unspecified: Secondary | ICD-10-CM | POA: Diagnosis not present

## 2016-08-30 DIAGNOSIS — K269 Duodenal ulcer, unspecified as acute or chronic, without hemorrhage or perforation: Secondary | ICD-10-CM

## 2016-08-30 DIAGNOSIS — R109 Unspecified abdominal pain: Secondary | ICD-10-CM

## 2016-08-30 MED ORDER — LEVOTHYROXINE SODIUM 25 MCG PO TABS: 25.0000 ug | ORAL_TABLET | Freq: Every day | ORAL | 2 refills | Status: DC

## 2016-08-30 NOTE — Patient Instructions (Addendum)
Strict avoidance of NSAIDs.  You will have labs checked today in the basement lab.  Please head down after you check out with the front desk  (h. Pylori serology, blood).  Stay on iron daily for now.  Please return to see Dr. Ardis Hughs in 3 months.  Normal BMI (Body Mass Index- based on height and weight) is between 23 and 30. Your BMI today is Body mass index is 19.02 kg/m. Marland Kitchen Please consider follow up  regarding your BMI with your Primary Care Provider.

## 2016-08-30 NOTE — Progress Notes (Signed)
HPI: This is a  very pleasant 76 year old woman  who was referred to me by Martinique, Betty G, MD  to evaluate  anemia .    Chief complaint is anemia, recent dark stools  She is here with her daughter today   Moved from [place] because of the [place].  Probably going back in 10/2016.  Had perforated ulcer, acute abdominal pains 05/2016.  This led to emergency laparotomy. I do not have the operative report from that surgery. It sounds like her recovery was slow but she spent some time in rehabilitation and eventually was able to go home. She then relocated, possibly temporary, here to [place] from [place]. She had been taking mobic, and PRN ibuprofen.   She is not sure if she was ever tested for H. pylori  She recovered in rehab, had issues with her right hip.  She had black colored stools in late june .  SHe had started taking iron shortly before that.  She never sees red blood in her stool.  Overall her weight is at it's normal, has gained 10-13 pounds since moving here.  No eating problems.  Normal BMs.  No constipation or diarrhea.  She wears a patch to help quit smoking  She drinks vodka daily (will drink a bottle about every 2 weeks). She says this is for pain.  Glass of wine weekly as well.  Her daughter told me that she made an agreement with her mother to not bother her about her alcohol intake as long as she was eating food as well.     Old Data Reviewed:   Colonoscopy July 2014 in [place]. This was done for personal history of polyps. Findings included "patchy discontinuous erosions in the sigmoid and rectum... These findings are compatible with colitis". Biopsies were taken. A single 4 mm polyp was found at the hepatic flexure and it was removed. I do not have any of the pathology reports from this procedure   Blood work June 2018 showed hemoglobin 9.6, MCV 102. Complete metabolic profile was normal. She was started on iron supplements.  Bloodwork July 2018  showed hemoglobin 10.3, MCV 100.  Upper GI May 2018 done while she was hospitalized for perforated duodenum ulcer showed no extravasation.    Review of systems: Pertinent positive and negative review of systems were noted in the above HPI section. All other review negative.   Past Medical History:  Diagnosis Date  . Anemia   . Anxiety   . B12 deficiency 05/2015   B12 was 184  . COPD (chronic obstructive pulmonary disease) ([place])   . Crohn's disease, small intestine ([place]) 02/19/2009  . Depression 10/2013   chronic recurrent major depressive disorder  . H/O vitamin D deficiency 08/04/2008  . Recurrent dislocation of hip, right     Past Surgical History:  Procedure Laterality Date  . JOINT REPLACEMENT     Right hip, 2015  . LAPAROSCOPY  06/03/2016   Duodenal ulcer repair    Current Outpatient Prescriptions  Medication Sig Dispense Refill  . albuterol (VENTOLIN HFA) 108 (90 Base) MCG/ACT inhaler Inhale 1 puff into the lungs every 6 (six) hours as needed for wheezing or shortness of breath.    Marland Kitchen atorvastatin (LIPITOR) 20 MG tablet Take 20 mg by mouth daily.    Marland Kitchen b complex vitamins tablet Take 1 tablet by mouth daily.    . Cholecalciferol (VITAMIN D3) 2000 units TABS Take 1 tablet by mouth daily.    . ferrous  sulfate 325 (65 FE) MG EC tablet Take 1 tablet (325 mg total) by mouth daily with breakfast. 90 tablet 1  . Fluticasone Furoate (ARNUITY ELLIPTA) 200 MCG/ACT AEPB Inhale 1 puff into the lungs daily. 30 each 3  . furosemide (LASIX) 20 MG tablet Take 1 tablet (20 mg total) by mouth daily. 30 tablet 1  . gabapentin (NEURONTIN) 300 MG capsule Take 300 mg by mouth 5 (five) times daily.    Marland Kitchen levothyroxine (SYNTHROID, LEVOTHROID) 25 MCG tablet Take 1 tablet (25 mcg total) by mouth daily before breakfast. 30 tablet 2  . Multiple Vitamin (MULTIVITAMIN) tablet Take 1 tablet by mouth daily.    . nicotine (NICODERM CQ - DOSED IN MG/24 HOURS) 14 mg/24hr patch Place 14 mg onto the skin  daily.    Marland Kitchen omeprazole (PRILOSEC) 20 MG capsule Take 1 capsule (20 mg total) by mouth 2 (two) times daily before a meal. 60 capsule 2  . PARoxetine (PAXIL) 20 MG tablet Take 20 mg by mouth daily.     No current facility-administered medications for this visit.     Allergies as of 08/30/2016 - Review Complete 08/30/2016  Allergen Reaction Noted  . Bactrim [sulfamethoxazole-trimethoprim]  07/13/2016  . Bee venom  07/13/2016    Family History  Problem Relation Age of Onset  . Cancer Neg Hx   . Diabetes Neg Hx     Social History   Social History  . Marital status: Married    Spouse name: N/A  . Number of children: N/A  . Years of education: N/A   Occupational History  . Not on file.   Social History Main Topics  . Smoking status: Current Every Day Smoker  . Smokeless tobacco: Never Used  . Alcohol use Yes  . Drug use: No  . Sexual activity: Not Currently   Other Topics Concern  . Not on file   Social History Narrative  . No narrative on file     Physical Exam: BP 122/72 (BP Location: Right Arm, Patient Position: Sitting, Cuff Size: Normal)   Pulse 66   Ht 5\' 2"  (1.575 m)   Wt 104 lb (47.2 kg)   BMI 19.02 kg/m  Constitutional: generally well-appearing, frail, elderly Psychiatric: alert and oriented x3 Eyes: extraocular movements intact Mouth: oral pharynx moist, no lesions Neck: supple no lymphadenopathy Cardiovascular: heart regular rate and rhythm Lungs: clear to auscultation bilaterally Abdomen: soft, nontender, nondistended, no obvious ascites, no peritoneal signs, normal bowel sounds Extremities: no lower extremity edema bilaterally Skin: no lesions on visible extremities   Assessment and plan: 76 y.o. female with  macrocytic anemia, recent perforated duodenal ulcer in [place]  First it sounds like her ulcer in [place] was related to NSAIDs. They told her to stop meloxicam and never resumed it. She had also been taking once in a while ibuprofen. She  was also recommended to begin taking proton pump inhibitor once or twice daily and to never stop. She is not sure and I cannot tell from her records if she was ever tested for H. pylori. We will do that for her now with serologies. If it is positive I will treat her for H. pylori. Her anemia is macrocytic, I'm not really sure that it indicates chronic GI blood loss. She did have some dark stools but it was after she started taking iron supplements about 2 months ago. She has no obvious overt GI bleeding. I don't think she needs repeat upper endoscopy or colonoscopy at this point. Most  struck by the fact that she drinks vodka daily and she had come to some sort of arrangement with her daughter in which her daughter really ignores her alcohol intake as long as she is eating food. She is eating food well and has actually gained about 12 pounds since moving from [place]. She will continue eating, I did tell her she should probably try to cut back on her alcohol intake which I think is probably a bigger deal for her than she lets on. She'll return to see me in 2-3 months as well.    Please see the "Patient Instructions" section for addition details about the plan.   Owens Loffler, MD [facility] 08/30/2016, 11:33 AM  Cc: Martinique, Betty G, MD

## 2016-09-01 ENCOUNTER — Encounter: Payer: Self-pay | Admitting: Physical Medicine & Rehabilitation

## 2016-09-01 ENCOUNTER — Encounter: Payer: Medicare Other | Attending: Physical Medicine & Rehabilitation | Admitting: Physical Medicine & Rehabilitation

## 2016-09-01 VITALS — BP 111/64 | HR 90 | Resp 16 | Ht 62.0 in | Wt 106.0 lb

## 2016-09-01 DIAGNOSIS — K5090 Crohn's disease, unspecified, without complications: Secondary | ICD-10-CM | POA: Diagnosis present

## 2016-09-01 DIAGNOSIS — G8929 Other chronic pain: Secondary | ICD-10-CM | POA: Diagnosis not present

## 2016-09-01 DIAGNOSIS — J449 Chronic obstructive pulmonary disease, unspecified: Secondary | ICD-10-CM | POA: Diagnosis not present

## 2016-09-01 DIAGNOSIS — M25551 Pain in right hip: Secondary | ICD-10-CM | POA: Diagnosis not present

## 2016-09-01 DIAGNOSIS — F419 Anxiety disorder, unspecified: Secondary | ICD-10-CM | POA: Diagnosis not present

## 2016-09-01 DIAGNOSIS — D649 Anemia, unspecified: Secondary | ICD-10-CM | POA: Diagnosis not present

## 2016-09-01 DIAGNOSIS — F17210 Nicotine dependence, cigarettes, uncomplicated: Secondary | ICD-10-CM | POA: Diagnosis not present

## 2016-09-01 DIAGNOSIS — M791 Myalgia, unspecified site: Secondary | ICD-10-CM

## 2016-09-01 DIAGNOSIS — G479 Sleep disorder, unspecified: Secondary | ICD-10-CM | POA: Diagnosis not present

## 2016-09-01 DIAGNOSIS — F1010 Alcohol abuse, uncomplicated: Secondary | ICD-10-CM | POA: Diagnosis not present

## 2016-09-01 DIAGNOSIS — K269 Duodenal ulcer, unspecified as acute or chronic, without hemorrhage or perforation: Secondary | ICD-10-CM | POA: Insufficient documentation

## 2016-09-01 DIAGNOSIS — F329 Major depressive disorder, single episode, unspecified: Secondary | ICD-10-CM | POA: Diagnosis not present

## 2016-09-01 DIAGNOSIS — R269 Unspecified abnormalities of gait and mobility: Secondary | ICD-10-CM | POA: Diagnosis not present

## 2016-09-01 DIAGNOSIS — M546 Pain in thoracic spine: Secondary | ICD-10-CM | POA: Diagnosis not present

## 2016-09-01 DIAGNOSIS — Z96641 Presence of right artificial hip joint: Secondary | ICD-10-CM | POA: Insufficient documentation

## 2016-09-01 DIAGNOSIS — W19XXXA Unspecified fall, initial encounter: Secondary | ICD-10-CM | POA: Diagnosis not present

## 2016-09-01 DIAGNOSIS — Z720 Tobacco use: Secondary | ICD-10-CM

## 2016-09-01 LAB — H PYLORI, IGM, IGG, IGA AB
H. pylori, IgA Abs: <9 units (ref 0.0–8.9)
H. pylori, IgG AbS: <0.8 Index Value (ref 0.00–0.79)

## 2016-09-01 MED ORDER — GABAPENTIN 600 MG PO TABS: 600.0000 mg | ORAL_TABLET | Freq: Three times a day (TID) | ORAL | 1 refills | Status: DC

## 2016-09-01 MED ORDER — METHOCARBAMOL 750 MG PO TABS: 750.0000 mg | ORAL_TABLET | Freq: Two times a day (BID) | ORAL | 1 refills | Status: DC | PRN

## 2016-09-01 MED ORDER — DICLOFENAC SODIUM 1 % TD GEL: 2.0000 g | Freq: Four times a day (QID) | TRANSDERMAL | 1 refills | Status: DC

## 2016-09-01 MED ORDER — AMITRIPTYLINE HCL 10 MG PO TABS: 10.0000 mg | ORAL_TABLET | Freq: Every day | ORAL | 1 refills | Status: DC
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
## 2016-09-02 ENCOUNTER — Ambulatory Visit: Payer: Medicare Other

## 2016-09-06 ENCOUNTER — Ambulatory Visit (INDEPENDENT_AMBULATORY_CARE_PROVIDER_SITE_OTHER): Payer: Medicare Other

## 2016-09-06 DIAGNOSIS — E538 Deficiency of other specified B group vitamins: Secondary | ICD-10-CM

## 2016-09-06 MED ORDER — CYANOCOBALAMIN 1000 MCG/ML IJ SOLN
1000.0000 ug | Freq: Once | INTRAMUSCULAR | Status: AC
Start: 2016-09-06 — End: 2016-09-06
  Administered 2016-09-06: 1000 ug via INTRAMUSCULAR

## 2016-09-13 ENCOUNTER — Ambulatory Visit (INDEPENDENT_AMBULATORY_CARE_PROVIDER_SITE_OTHER): Payer: Medicare Other

## 2016-09-13 DIAGNOSIS — E538 Deficiency of other specified B group vitamins: Secondary | ICD-10-CM

## 2016-09-13 MED ORDER — CYANOCOBALAMIN 1000 MCG/ML IJ SOLN
1000.0000 ug | Freq: Once | INTRAMUSCULAR | Status: AC
  Administered 2016-09-13: 1000 ug via INTRAMUSCULAR

## 2016-09-13 NOTE — Progress Notes (Signed)
Pt came for her b12 injection. She tolerated her injection well

## 2016-09-16 ENCOUNTER — Ambulatory Visit (INDEPENDENT_AMBULATORY_CARE_PROVIDER_SITE_OTHER)
Admission: RE | Admit: 2016-09-16 | Discharge: 2016-09-16 | Disposition: A | Discharge status: Home / Self Care | Payer: Medicare Other | Source: Ambulatory Visit | Attending: Family Medicine | Admitting: Family Medicine

## 2016-09-16 DIAGNOSIS — R0989 Other specified symptoms and signs involving the circulatory and respiratory systems: Secondary | ICD-10-CM

## 2016-09-23 ENCOUNTER — Ambulatory Visit (INDEPENDENT_AMBULATORY_CARE_PROVIDER_SITE_OTHER): Payer: Medicare Other

## 2016-09-23 DIAGNOSIS — E538 Deficiency of other specified B group vitamins: Secondary | ICD-10-CM

## 2016-09-23 MED ORDER — CYANOCOBALAMIN 1000 MCG/ML IJ SOLN
1000.0000 ug | Freq: Once | INTRAMUSCULAR | Status: AC
  Administered 2016-09-23: 1000 ug via INTRAMUSCULAR

## 2016-09-26 DIAGNOSIS — E039 Hypothyroidism, unspecified: Secondary | ICD-10-CM

## 2016-09-26 DIAGNOSIS — I517 Cardiomegaly: Secondary | ICD-10-CM | POA: Insufficient documentation

## 2016-09-26 HISTORY — DX: Hypothyroidism, unspecified: E03.9

## 2016-09-26 NOTE — Progress Notes (Signed)
HPI:   Kelly [name] is a 76 y.o. female, who is here today to follow on recent OV.   She was seen on 08/16/16 to follow on exertional dyspnea and edema. Since her last OV she has seen Dr Posey Pronto for pain management and Dr Ardis Hughs for abdominal pain and upper GI bleed.  Lab Results  Component Value Date   WBC 7.2 08/16/2016   HGB 10.3 (L) 08/16/2016   HCT 31.9 (L) 08/16/2016   MCV 99.8 08/16/2016   PLT 363.0 08/16/2016    Echo done on 08/22/16 showed - LVEF 65-70%, severe concentric LVH with cordal SAM, dynamic mid-cavitary gradient of 41 mmHg at rest that increases to 71 mmHg with valsalva. She was referred to cardiologists, she has an appt with Dr Oval Linsey on 09/29/16.  Dyspnea has improved, "not bad", still trying to quit smoking.  She denies chest pain,orthopnea,or PND.  LE edema has also improved, R>L. She denies calves pain or erythema.  She has not tried compression stockings. Denies headache, visual changes, chest pain, palpitation,or focal weakness.  Taking Furosemide 20 mg daily.   B12 deficiency: Started B12 1000 mcg IM, has received 3 injections so far.    Lab Results  Component Value Date   VITAMINB12 176 (L) 08/16/2016   Hypothyroidism:  Currently she is on Levothyroxine 25 mcg daily. . Tolerating medication well, no side effects reported. She has not noted dysphagia,abdominal pain, changes in bowel habits, cold/heat intolerance, or abnormal weight loss.  Lab Results  Component Value Date   TSH 6.51 (H) 08/16/2016   + increased hair loss for about 3 weeks, no scalp lesions of alopecic areas.  Anxiety:  She is on Paxil 20 mg daily, which she has taken for many years. Medication still helps. She is living with her daughter and grandchildren, she disagrees with her life style and how her 27 yo granddaughter behaves. Living with her daughter has aggravated some her anxiety. She denies depressed mood. Thinking about going back to [place] in  11/2016, she misses her house and her husband.  Still drinking vodka but less, also drinks wine with dinner.  She is requesting to have hip X ray that was ordered by Dr Posey Pronto (pain management) at [facility]. She cannot afford X ray at the place that was initially arranged. Gabapentin was increased and she was stated on Amitriptyline 10 mg at night.    Review of Systems  Constitutional: Positive for fatigue. Negative for activity change, appetite change, fever and unexpected weight change.  HENT: Negative for mouth sores, nosebleeds and trouble swallowing.   Eyes: Negative for redness and visual disturbance.  Respiratory: Positive for shortness of breath. Negative for cough and wheezing.   Cardiovascular: Positive for leg swelling. Negative for chest pain and palpitations.  Gastrointestinal: Positive for abdominal pain (stable). Negative for nausea and vomiting.       Negative for changes in bowel habits.  Endocrine: Negative for cold intolerance and heat intolerance.  Genitourinary: Negative for decreased urine volume, dysuria and hematuria.  Musculoskeletal: Positive for arthralgias and back pain.  Skin: Negative for rash and wound.  Neurological: Negative for syncope, weakness and headaches.  Psychiatric/Behavioral: Negative for confusion. The patient is nervous/anxious.       Current Outpatient Prescriptions on File Prior to Visit  Medication Sig Dispense Refill  . albuterol (VENTOLIN HFA) 108 (90 Base) MCG/ACT inhaler Inhale 1 puff into the lungs every 6 (six) hours as needed for wheezing or shortness of breath.    Marland Kitchen  amitriptyline (ELAVIL) 10 MG tablet Take 1 tablet (10 mg total) by mouth at bedtime. 30 tablet 1  . atorvastatin (LIPITOR) 20 MG tablet Take 20 mg by mouth daily.    Marland Kitchen b complex vitamins tablet Take 1 tablet by mouth daily.    . Cholecalciferol (VITAMIN D3) 2000 units TABS Take 1 tablet by mouth daily.    . diclofenac sodium (VOLTAREN) 1 % GEL Apply 2 g topically 4 (four)  times daily. 1 Tube 1  . ferrous sulfate 325 (65 FE) MG EC tablet Take 1 tablet (325 mg total) by mouth daily with breakfast. 90 tablet 1  . Fluticasone Furoate (ARNUITY ELLIPTA) 200 MCG/ACT AEPB Inhale 1 puff into the lungs daily. 30 each 3  . gabapentin (NEURONTIN) 600 MG tablet Take 1 tablet (600 mg total) by mouth 3 (three) times daily. 90 tablet 1  . levothyroxine (SYNTHROID, LEVOTHROID) 25 MCG tablet Take 1 tablet (25 mcg total) by mouth daily before breakfast. 30 tablet 2  . methocarbamol (ROBAXIN-750) 750 MG tablet Take 1 tablet (750 mg total) by mouth 2 (two) times daily as needed for muscle spasms. 60 tablet 1  . Multiple Vitamin (MULTIVITAMIN) tablet Take 1 tablet by mouth daily.    . nicotine (NICODERM CQ - DOSED IN MG/24 HOURS) 14 mg/24hr patch Place 14 mg onto the skin daily.    Marland Kitchen omeprazole (PRILOSEC) 20 MG capsule Take 1 capsule (20 mg total) by mouth 2 (two) times daily before a meal. 60 capsule 2  . PARoxetine (PAXIL) 20 MG tablet Take 20 mg by mouth daily.     No current facility-administered medications on file prior to visit.      Past Medical History:  Diagnosis Date  . Anemia   . Anxiety   . B12 deficiency 05/2015   B12 was 184  . COPD (chronic obstructive pulmonary disease) ([place])   . Crohn's disease, small intestine ([place]) 02/19/2009  . Depression 10/2013   chronic recurrent major depressive disorder  . H/O vitamin D deficiency 08/04/2008  . Recurrent dislocation of hip, right    Allergies  Allergen Reactions  . Bactrim [Sulfamethoxazole-Trimethoprim]   . Bee Venom     Social History   Social History  . Marital status: Married    Spouse name: N/A  . Number of children: N/A  . Years of education: N/A   Social History Main Topics  . Smoking status: Current Every Day Smoker    Types: Cigarettes  . Smokeless tobacco: Never Used  . Alcohol use Yes  . Drug use: No  . Sexual activity: Not Currently   Other Topics Concern  . None   Social History  Narrative  . None    Vitals:   09/27/16 1156  BP: 136/60  Pulse: 87  Resp: 16  SpO2: 97%   Body mass index is 19.87 kg/m.   Physical Exam  Nursing note and vitals reviewed. Constitutional: She is oriented to person, place, and time. She appears well-developed. No distress.  HENT:  Head: Normocephalic and atraumatic.  Mouth/Throat: Oropharynx is clear and moist and mucous membranes are normal.  Poor dentition.  Eyes: Conjunctivae are normal.  Neck: No tracheal deviation present. No thyroid mass and no thyromegaly present.  Cardiovascular: Normal rate and regular rhythm.   Murmur (Heard at the base SEM II/VI) heard. DP pulses present bilateral.  Respiratory: Effort normal and breath sounds normal. No respiratory distress.  GI: Soft. She exhibits no mass. There is no hepatomegaly. There is generalized  tenderness (Mild). There is no rebound and no guarding.  Musculoskeletal: She exhibits edema (Trace pitting LE edema,bilateral. Periankle edema R>L). She exhibits no tenderness.  Lymphadenopathy:    She has no cervical adenopathy.  Neurological: She is alert and oriented to person, place, and time. She has normal strength.  Stable gait with no assistance.  Skin: Skin is warm. No rash noted. No erythema.  Hair pull test negative, 2 hairs. No scal lesions and no alopecia appreciated.  Psychiatric: Her mood appears anxious.  Fairly groomed, good eye contact.      ASSESSMENT AND PLAN:   Kelly [name] was seen today for follow-up.  Diagnoses and all orders for this visit:  B12 deficiency  After today's B12 1000 mcg IM she can continue monthly injections. Will plan on re-checking B12 with next labs.  -     cyanocobalamin ((VITAMIN B-12)) injection 1,000 mcg; Inject 1 mL (1,000 mcg total) into the muscle once.  Bilateral lower extremity edema  Improved. Continue Furosemide 20 mg daily. LE elevation above heart level may also help as well as compression  stocking.  Severe concentric left ventricular hypertrophy  This could contribute to dyspnea (along with COPD). Keep appt with cardiologists. Instructed about warning signs.     Component Value Date/Time   PROBNP 285.0 (H) 08/16/2016 1046    Hypothyroidism, unspecified type  No changes in current management, will follow labs done today and will give further recommendations accordingly. F/U in 3-4 months.  -     TSH  Anxiety disorder, unspecified type  Stable. No changes in current management. Instructed about warning signs. F/U in 3-4 months.  Hair loss  Possible etiologies dicussed.  ?Telogen effluvium, educated about Dx.   Need for influenza vaccination -     Flu vaccine HIGH DOSE PF  Hip pain, right  Same order placed for hip X ray to be done at [facility] as she requested. Continue following with Dr Posey Pronto for chronic pain management.  -     DG HIP UNILAT WITH PELVIS 2-3 VIEWS RIGHT; Future    I will see her back in 3 months if she is still in town, when we will re-check B12,CBC,25 OH vit D, and BMP.     Betty G. Martinique, MD  [facility]. [place] office.

## 2016-09-27 ENCOUNTER — Encounter: Payer: Self-pay | Admitting: Family Medicine

## 2016-09-27 ENCOUNTER — Ambulatory Visit (INDEPENDENT_AMBULATORY_CARE_PROVIDER_SITE_OTHER): Payer: Medicare Other | Admitting: Family Medicine

## 2016-09-27 VITALS — BP 136/60 | HR 87 | Resp 16 | Ht 61.25 in | Wt 106.0 lb

## 2016-09-27 DIAGNOSIS — Z23 Encounter for immunization: Secondary | ICD-10-CM | POA: Diagnosis not present

## 2016-09-27 DIAGNOSIS — I517 Cardiomegaly: Secondary | ICD-10-CM

## 2016-09-27 DIAGNOSIS — E039 Hypothyroidism, unspecified: Secondary | ICD-10-CM | POA: Diagnosis not present

## 2016-09-27 DIAGNOSIS — F419 Anxiety disorder, unspecified: Secondary | ICD-10-CM | POA: Diagnosis not present

## 2016-09-27 DIAGNOSIS — R600 Localized edema: Secondary | ICD-10-CM

## 2016-09-27 DIAGNOSIS — M25551 Pain in right hip: Secondary | ICD-10-CM

## 2016-09-27 DIAGNOSIS — E538 Deficiency of other specified B group vitamins: Secondary | ICD-10-CM

## 2016-09-27 DIAGNOSIS — L659 Nonscarring hair loss, unspecified: Secondary | ICD-10-CM

## 2016-09-27 LAB — TSH: TSH: 4.76 u[IU]/mL — ABNORMAL HIGH (ref 0.35–4.50)

## 2016-09-27 MED ORDER — CYANOCOBALAMIN 1000 MCG/ML IJ SOLN
1000.0000 ug | Freq: Once | INTRAMUSCULAR | Status: AC
  Administered 2016-09-27: 1000 ug via INTRAMUSCULAR

## 2016-09-27 NOTE — Patient Instructions (Addendum)
A few things to remember from today's visit:   B12 deficiency  Bilateral lower extremity edema  Severe concentric left ventricular hypertrophy  Anxiety disorder, unspecified type  Hypothyroidism, unspecified type - Plan: TSH  Right hip pain - Plan: CANCELED: DG HIP UNILAT WITH PELVIS 2-3 VIEWS RIGHT  Hip pain, right - Plan: DG HIP UNILAT WITH PELVIS 2-3 VIEWS RIGHT  Today we'll check her thyroid.  In 3 to 4 months we will recheck your anemia, B12, and kidneys. Please be sure medication list is accurate. If a new problem present, please set up appointment sooner than planned today.

## 2016-09-28 ENCOUNTER — Other Ambulatory Visit: Payer: Self-pay | Admitting: Family Medicine

## 2016-09-28 DIAGNOSIS — E039 Hypothyroidism, unspecified: Secondary | ICD-10-CM

## 2016-09-29 ENCOUNTER — Encounter: Payer: Medicare Other | Admitting: Physical Medicine & Rehabilitation

## 2016-09-29 ENCOUNTER — Ambulatory Visit (INDEPENDENT_AMBULATORY_CARE_PROVIDER_SITE_OTHER): Payer: Medicare Other | Admitting: Cardiovascular Disease

## 2016-09-29 ENCOUNTER — Other Ambulatory Visit: Payer: Self-pay | Admitting: Family Medicine

## 2016-09-29 ENCOUNTER — Encounter: Payer: Self-pay | Admitting: Cardiovascular Disease

## 2016-09-29 ENCOUNTER — Ambulatory Visit: Payer: Medicare Other | Admitting: Cardiovascular Disease

## 2016-09-29 VITALS — BP 156/77 | HR 90 | Ht 62.0 in | Wt 106.8 lb

## 2016-09-29 DIAGNOSIS — Z720 Tobacco use: Secondary | ICD-10-CM | POA: Diagnosis not present

## 2016-09-29 DIAGNOSIS — I110 Hypertensive heart disease with heart failure: Secondary | ICD-10-CM | POA: Diagnosis not present

## 2016-09-29 DIAGNOSIS — R600 Localized edema: Secondary | ICD-10-CM

## 2016-09-29 DIAGNOSIS — E7800 Pure hypercholesterolemia, unspecified: Secondary | ICD-10-CM

## 2016-09-29 MED ORDER — METOPROLOL SUCCINATE ER 50 MG PO TB24: 50.0000 mg | ORAL_TABLET | Freq: Every day | ORAL | 5 refills | Status: DC

## 2016-09-29 NOTE — Progress Notes (Signed)
Cardiology Office Note   Date:  10/01/2016   ID:  Kelly [name], DOB 04-Oct-1940, MRN [id number]  PCP:  Martinique, Betty G, MD  Cardiologist:   Skeet Latch, MD   Chief Complaint  Patient presents with  . New Patient (Initial Visit)    History of Present Illness: Kelly [name] is a 76 y.o. female with hyperlipidemia, hypothyroidism, and tobacco abuse who presents for an evaluation of shortness of breath and edema.  She reported these symptoms to Dr. Martinique and was referred for an echo.  She had an echo 08/22/16 that revealed LVEF 65-70% and grade 1 diastolic dysfunction. She was also noted to have dynamic outflow tract obstruction with a peak gradient of 71 mmHg with Valsalva and systolic anterior motion of the mitral valve.  Kelly [name] has noted mild shortness of breath but denies orthopnea or PND. She does have some lower extremity edema that has improved with Lasix and with elevation of her legs. Her shortness of breath has improved with the use of an albuterol inhaler. She is currently living with her daughter and has been doing more housework in order to help her. She gets short of breath with housework and carrying garbage cans.    Kelly [name] lives in [place] for  Most of the year.  She was in town visiting her daughter and had a perforated duodenal ulcer that required surgery.  She plans to return to [place] 11/2016.  She continues to smoke 8 cigarettes per day down from one pack per day in the past. She is currently using nicotine replacement patches.  She does not typically checks her blood pressure at home.  Past Medical History:  Diagnosis Date  . Anemia   . Anxiety   . B12 deficiency 05/2015   B12 was 184  . COPD (chronic obstructive pulmonary disease) ([place])   . Crohn's disease, small intestine ([place]) 02/19/2009  . Depression 10/2013   chronic recurrent major depressive disorder  . H/O vitamin D deficiency 08/04/2008  . Recurrent dislocation of hip, right     Past  Surgical History:  Procedure Laterality Date  . JOINT REPLACEMENT     Right hip, 2015  . LAPAROSCOPY  06/03/2016   Duodenal ulcer repair     Current Outpatient Prescriptions  Medication Sig Dispense Refill  . albuterol (VENTOLIN HFA) 108 (90 Base) MCG/ACT inhaler Inhale 1 puff into the lungs every 6 (six) hours as needed for wheezing or shortness of breath.    Marland Kitchen amitriptyline (ELAVIL) 10 MG tablet Take 1 tablet (10 mg total) by mouth at bedtime. 30 tablet 1  . atorvastatin (LIPITOR) 20 MG tablet Take 20 mg by mouth daily.    Marland Kitchen b complex vitamins tablet Take 1 tablet by mouth daily.    . Cholecalciferol (VITAMIN D3) 2000 units TABS Take 1 tablet by mouth daily.    . diclofenac sodium (VOLTAREN) 1 % GEL Apply 2 g topically 4 (four) times daily. 1 Tube 1  . ferrous sulfate 325 (65 FE) MG EC tablet Take 1 tablet (325 mg total) by mouth daily with breakfast. 90 tablet 1  . Fluticasone Furoate (ARNUITY ELLIPTA) 200 MCG/ACT AEPB Inhale 1 puff into the lungs daily. 30 each 3  . gabapentin (NEURONTIN) 600 MG tablet Take 1 tablet (600 mg total) by mouth 3 (three) times daily. 90 tablet 1  . levothyroxine (SYNTHROID, LEVOTHROID) 25 MCG tablet Take 1 tablet (25 mcg total) by mouth daily before breakfast. 30 tablet 2  . methocarbamol (  ROBAXIN-750) 750 MG tablet Take 1 tablet (750 mg total) by mouth 2 (two) times daily as needed for muscle spasms. 60 tablet 1  . Multiple Vitamin (MULTIVITAMIN) tablet Take 1 tablet by mouth daily.    . nicotine (NICODERM CQ - DOSED IN MG/24 HOURS) 14 mg/24hr patch Place 14 mg onto the skin daily.    Marland Kitchen omeprazole (PRILOSEC) 20 MG capsule Take 1 capsule (20 mg total) by mouth 2 (two) times daily before a meal. 60 capsule 2  . PARoxetine (PAXIL) 20 MG tablet Take 20 mg by mouth daily.    . furosemide (LASIX) 20 MG tablet take 1 tablet by mouth once daily 90 tablet 0  . metoprolol succinate (TOPROL XL) 50 MG 24 hr tablet Take 1 tablet (50 mg total) by mouth daily. Take with  or immediately following a meal. 30 tablet 5   No current facility-administered medications for this visit.     Allergies:   Bactrim [sulfamethoxazole-trimethoprim] and Bee venom    Social History:  The patient  reports that she has been smoking Cigarettes.  She has never used smokeless tobacco. She reports that she drinks alcohol. She reports that she does not use drugs.   Family History:  The patient's family history includes Alcohol abuse in her mother; Breast cancer in her sister; Colon cancer in her sister; Heart attack in her brother; Heart disease in her mother.    ROS:  Please see the history of present illness.   Otherwise, review of systems are positive for none.   All other systems are reviewed and negative.    PHYSICAL EXAM: VS:  BP (!) 156/77   Pulse 90   Ht 5\' 2"  (1.575 m)   Wt 48.4 kg (106 lb 12.8 oz)   BMI 19.53 kg/m  , BMI Body mass index is 19.53 kg/m. GENERAL:  Well appearing HEENT:  Pupils equal round and reactive, fundi not visualized, oral mucosa unremarkable NECK:  No jugular venous distention, waveform within normal limits, carotid upstroke brisk and symmetric, no bruits, no thyromegaly LYMPHATICS:  No cervical adenopathy LUNGS:  Clear to auscultation bilaterally HEART:  RRR.  PMI not displaced or sustained,S1 and S2 within normal limits, no S3, no S4, no clicks, no rubs, III/VI systolic murmur at the LUSB.  Increases with Valsalva and hand grip.  ABD:  Flat, positive bowel sounds normal in frequency in pitch, no bruits, no rebound, no guarding, no midline pulsatile mass, no hepatomegaly, no splenomegaly EXT:  2 plus pulses throughout, no edema, no cyanosis no clubbing SKIN:  No rashes no nodules NEURO:  Cranial nerves II through XII grossly intact, motor grossly intact throughout PSYCH:  Cognitively intact, oriented to person place and time    EKG:  EKG is ordered today. The ekg ordered today demonstrates sinus rhythm.  Rate 90 bpm.  Large T  waves   Recent Labs: 07/13/2016: ALT 11 08/16/2016: BUN 12; Creatinine, Ser 0.72; Hemoglobin 10.3; Platelets 363.0; Potassium 3.7; Pro B Natriuretic peptide (BNP) 285.0; Sodium 139 09/27/2016: TSH 4.76    Lipid Panel No results found for: CHOL, TRIG, HDL, CHOLHDL, VLDL, LDLCALC, LDLDIRECT    Wt Readings from Last 3 Encounters:  09/29/16 48.4 kg (106 lb 12.8 oz)  09/27/16 48.1 kg (106 lb)  09/01/16 48.1 kg (106 lb)      ASSESSMENT AND PLAN:  # Hypertensive heart disease:  # Concentric LVH: # LE Edema: Echo personally reviewed.  Kelly [name] has mild-moderate LVH (1.3-1.4cm wall thickness).  She also  has hypertension that is not well controlled. This is more consistent with hypertensive heart disease than HOCM.  We will start metoprolol succinate 50mg  daily for both blood pressure control and to reduce her contractility and outflow tract gradient.  OK to increase lasix 40 mg daily instead of 20mg   as needed for increased edema.  Avoid volume depletion, as thin could worsen her gradient.  # Tobacco abuse: Kelly [name] was encouraged to continue her smoking cessation efforts.  # Hyperlipidemia: Continue atorvastatin.  Current medicines are reviewed at length with the patient today.  The patient does not have concerns regarding medicines.  The following changes have been made:  Start metoprolol succinate 50 mg daily   Labs/ tests ordered today include:  No orders of the defined types were placed in this encounter.    Disposition:   FU with Arkin Imran C. Oval Linsey, MD, [facility] in 1 month.    This note was written with the assistance of speech recognition software.  Please excuse any transcriptional errors.  Signed, Adan Beal C. Oval Linsey, MD, [facility]  10/01/2016 6:00 PM    [place]

## 2016-09-29 NOTE — Patient Instructions (Addendum)
Goal blood pressure is less than 130/80  Medication Instructions:  START METOPROLOL SUC 50 MG DAILY   OK TO TAKE AN EXTRA FUROSEMIDE DAILY AS NEEDED FOR SWELLING IN YOUR LEGS OR FEET   Labwork: NONE  Testing/Procedures: NONE  Follow-Up:  Your physician recommends that you schedule a follow-up appointment in: 1 MONTH OV WITH DR [place]/PA/NP  If you need a refill on your cardiac medications before your next appointment, please call your pharmacy.

## 2016-10-05 ENCOUNTER — Encounter: Payer: Medicare Other | Attending: Physical Medicine & Rehabilitation | Admitting: Physical Medicine & Rehabilitation

## 2016-10-05 ENCOUNTER — Encounter: Payer: Self-pay | Admitting: Physical Medicine & Rehabilitation

## 2016-10-05 VITALS — BP 99/63 | HR 60

## 2016-10-05 DIAGNOSIS — D649 Anemia, unspecified: Secondary | ICD-10-CM | POA: Insufficient documentation

## 2016-10-05 DIAGNOSIS — G479 Sleep disorder, unspecified: Secondary | ICD-10-CM | POA: Diagnosis not present

## 2016-10-05 DIAGNOSIS — F17210 Nicotine dependence, cigarettes, uncomplicated: Secondary | ICD-10-CM | POA: Diagnosis not present

## 2016-10-05 DIAGNOSIS — K269 Duodenal ulcer, unspecified as acute or chronic, without hemorrhage or perforation: Secondary | ICD-10-CM | POA: Insufficient documentation

## 2016-10-05 DIAGNOSIS — F1010 Alcohol abuse, uncomplicated: Secondary | ICD-10-CM | POA: Diagnosis not present

## 2016-10-05 DIAGNOSIS — M791 Myalgia, unspecified site: Secondary | ICD-10-CM

## 2016-10-05 DIAGNOSIS — G8929 Other chronic pain: Secondary | ICD-10-CM | POA: Diagnosis not present

## 2016-10-05 DIAGNOSIS — F419 Anxiety disorder, unspecified: Secondary | ICD-10-CM | POA: Insufficient documentation

## 2016-10-05 DIAGNOSIS — M546 Pain in thoracic spine: Secondary | ICD-10-CM | POA: Insufficient documentation

## 2016-10-05 DIAGNOSIS — Z96641 Presence of right artificial hip joint: Secondary | ICD-10-CM | POA: Insufficient documentation

## 2016-10-05 DIAGNOSIS — R269 Unspecified abnormalities of gait and mobility: Secondary | ICD-10-CM | POA: Insufficient documentation

## 2016-10-05 DIAGNOSIS — J449 Chronic obstructive pulmonary disease, unspecified: Secondary | ICD-10-CM | POA: Insufficient documentation

## 2016-10-05 DIAGNOSIS — F329 Major depressive disorder, single episode, unspecified: Secondary | ICD-10-CM | POA: Insufficient documentation

## 2016-10-05 DIAGNOSIS — M25551 Pain in right hip: Secondary | ICD-10-CM | POA: Diagnosis not present

## 2016-10-05 DIAGNOSIS — Z720 Tobacco use: Secondary | ICD-10-CM

## 2016-10-05 DIAGNOSIS — K5090 Crohn's disease, unspecified, without complications: Secondary | ICD-10-CM | POA: Insufficient documentation
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
## 2016-10-28 ENCOUNTER — Ambulatory Visit (INDEPENDENT_AMBULATORY_CARE_PROVIDER_SITE_OTHER): Payer: Medicare Other | Admitting: *Deleted

## 2016-10-28 ENCOUNTER — Other Ambulatory Visit (INDEPENDENT_AMBULATORY_CARE_PROVIDER_SITE_OTHER): Payer: Medicare Other

## 2016-10-28 DIAGNOSIS — E039 Hypothyroidism, unspecified: Secondary | ICD-10-CM | POA: Diagnosis not present

## 2016-10-28 DIAGNOSIS — E538 Deficiency of other specified B group vitamins: Secondary | ICD-10-CM | POA: Diagnosis not present

## 2016-10-28 LAB — TSH: TSH: 4.25 u[IU]/mL (ref 0.35–4.50)

## 2016-10-28 MED ORDER — CYANOCOBALAMIN 1000 MCG/ML IJ SOLN
1000.0000 ug | Freq: Once | INTRAMUSCULAR | Status: AC
  Administered 2016-10-28: 1000 ug via INTRAMUSCULAR

## 2016-10-28 NOTE — Progress Notes (Signed)
Patient here for monthly B12 injection per 08/16/16 result notes.  Last injection 10/27/16. Patient tolerated injection well.  Dorrene German, RN

## 2016-11-01 ENCOUNTER — Telehealth: Payer: Self-pay | Admitting: *Deleted

## 2016-11-01 NOTE — Telephone Encounter (Signed)
Left message to call back Patient was supposed to be seen 1 month follow up, no appointment scheduled. Needs follow up soon

## 2016-11-16 ENCOUNTER — Encounter: Payer: Medicare Other | Attending: Physical Medicine & Rehabilitation | Admitting: Physical Medicine & Rehabilitation

## 2016-11-16 ENCOUNTER — Encounter: Payer: Self-pay | Admitting: Physical Medicine & Rehabilitation

## 2016-11-16 VITALS — BP 151/77 | HR 63

## 2016-11-16 DIAGNOSIS — F1010 Alcohol abuse, uncomplicated: Secondary | ICD-10-CM | POA: Diagnosis not present

## 2016-11-16 DIAGNOSIS — Z720 Tobacco use: Secondary | ICD-10-CM | POA: Diagnosis not present

## 2016-11-16 DIAGNOSIS — F329 Major depressive disorder, single episode, unspecified: Secondary | ICD-10-CM | POA: Diagnosis not present

## 2016-11-16 DIAGNOSIS — K5090 Crohn's disease, unspecified, without complications: Secondary | ICD-10-CM | POA: Insufficient documentation

## 2016-11-16 DIAGNOSIS — Z96641 Presence of right artificial hip joint: Secondary | ICD-10-CM | POA: Insufficient documentation

## 2016-11-16 DIAGNOSIS — D649 Anemia, unspecified: Secondary | ICD-10-CM | POA: Insufficient documentation

## 2016-11-16 DIAGNOSIS — G479 Sleep disorder, unspecified: Secondary | ICD-10-CM | POA: Diagnosis not present

## 2016-11-16 DIAGNOSIS — M25551 Pain in right hip: Secondary | ICD-10-CM | POA: Insufficient documentation

## 2016-11-16 DIAGNOSIS — R269 Unspecified abnormalities of gait and mobility: Secondary | ICD-10-CM

## 2016-11-16 DIAGNOSIS — F17210 Nicotine dependence, cigarettes, uncomplicated: Secondary | ICD-10-CM | POA: Diagnosis not present

## 2016-11-16 DIAGNOSIS — M791 Myalgia, unspecified site: Secondary | ICD-10-CM

## 2016-11-16 DIAGNOSIS — F419 Anxiety disorder, unspecified: Secondary | ICD-10-CM | POA: Insufficient documentation

## 2016-11-16 DIAGNOSIS — G8929 Other chronic pain: Secondary | ICD-10-CM | POA: Diagnosis not present

## 2016-11-16 DIAGNOSIS — J449 Chronic obstructive pulmonary disease, unspecified: Secondary | ICD-10-CM | POA: Insufficient documentation

## 2016-11-16 DIAGNOSIS — M546 Pain in thoracic spine: Secondary | ICD-10-CM

## 2016-11-16 DIAGNOSIS — K269 Duodenal ulcer, unspecified as acute or chronic, without hemorrhage or perforation: Secondary | ICD-10-CM | POA: Diagnosis not present

## 2016-11-16 NOTE — Patient Instructions (Signed)
Medical Records: [id number]
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
## 2016-11-22 ENCOUNTER — Telehealth: Payer: Self-pay | Admitting: Family Medicine

## 2016-11-22 NOTE — Telephone Encounter (Signed)
Pt states she has been in [place] longer than she had expected, and now needs a refill of PARoxetine (PAXIL) 20 MG tablet  Pt states Dr Martinique awaer she is on this but has never filled.   Last filled in [place].  [place], [place]

## 2016-11-25 ENCOUNTER — Other Ambulatory Visit: Payer: Self-pay | Admitting: Family Medicine

## 2016-11-25 ENCOUNTER — Telehealth: Payer: Self-pay | Admitting: Family Medicine

## 2016-11-25 DIAGNOSIS — F419 Anxiety disorder, unspecified: Secondary | ICD-10-CM

## 2016-11-25 MED ORDER — PAROXETINE HCL 20 MG PO TABS: 20.0000 mg | ORAL_TABLET | Freq: Every day | ORAL | 0 refills | Status: DC

## 2016-11-25 NOTE — Telephone Encounter (Signed)
Rx for Paroxetine sent to her pharmacy. F/U in 3-4 months either here in town or in [place]. [she was planning on going back].  Thanks, BJ

## 2016-11-25 NOTE — Telephone Encounter (Signed)
° ° ° ° °  Pt is following up to ask if Dr Martinique is going to fill the below for her    PARoxetine (PAXIL) 20 MG tablet

## 2016-11-25 NOTE — Telephone Encounter (Signed)
Left message informing patient that rx was sent to the pharmacy and that she needs a follow-up in 3-4 months per Dr. Martinique.

## 2017-01-12 ENCOUNTER — Encounter: Payer: Medicare Other | Attending: Physical Medicine & Rehabilitation | Admitting: Physical Medicine & Rehabilitation

## 2017-01-12 DIAGNOSIS — Z96641 Presence of right artificial hip joint: Secondary | ICD-10-CM | POA: Insufficient documentation

## 2017-01-12 DIAGNOSIS — F17210 Nicotine dependence, cigarettes, uncomplicated: Secondary | ICD-10-CM | POA: Insufficient documentation

## 2017-01-12 DIAGNOSIS — J449 Chronic obstructive pulmonary disease, unspecified: Secondary | ICD-10-CM | POA: Insufficient documentation

## 2017-01-12 DIAGNOSIS — M546 Pain in thoracic spine: Secondary | ICD-10-CM | POA: Insufficient documentation

## 2017-01-12 DIAGNOSIS — K269 Duodenal ulcer, unspecified as acute or chronic, without hemorrhage or perforation: Secondary | ICD-10-CM | POA: Insufficient documentation

## 2017-01-12 DIAGNOSIS — F1010 Alcohol abuse, uncomplicated: Secondary | ICD-10-CM | POA: Insufficient documentation

## 2017-01-12 DIAGNOSIS — K5090 Crohn's disease, unspecified, without complications: Secondary | ICD-10-CM | POA: Insufficient documentation

## 2017-01-12 DIAGNOSIS — G479 Sleep disorder, unspecified: Secondary | ICD-10-CM | POA: Insufficient documentation

## 2017-01-12 DIAGNOSIS — M25551 Pain in right hip: Secondary | ICD-10-CM | POA: Insufficient documentation

## 2017-01-12 DIAGNOSIS — F419 Anxiety disorder, unspecified: Secondary | ICD-10-CM | POA: Insufficient documentation

## 2017-01-12 DIAGNOSIS — D649 Anemia, unspecified: Secondary | ICD-10-CM | POA: Insufficient documentation

## 2017-01-12 DIAGNOSIS — F329 Major depressive disorder, single episode, unspecified: Secondary | ICD-10-CM | POA: Insufficient documentation

## 2017-01-12 DIAGNOSIS — R269 Unspecified abnormalities of gait and mobility: Secondary | ICD-10-CM | POA: Insufficient documentation

## 2017-01-12 DIAGNOSIS — G8929 Other chronic pain: Secondary | ICD-10-CM | POA: Insufficient documentation

## 2017-04-18 NOTE — Telephone Encounter (Signed)
Left message to call back  

## 2017-05-01 NOTE — Telephone Encounter (Signed)
Left message to call back  

## 2017-05-16 NOTE — Telephone Encounter (Signed)
Patient never called back.

## 2017-09-05 IMAGING — DX DG CHEST 2V
2 series · 2 of 2 positions shown · non-contrast
Comparison: None.

CLINICAL DATA: Chest rales.

EXAM:
CHEST  2 VIEW

[chest pa]
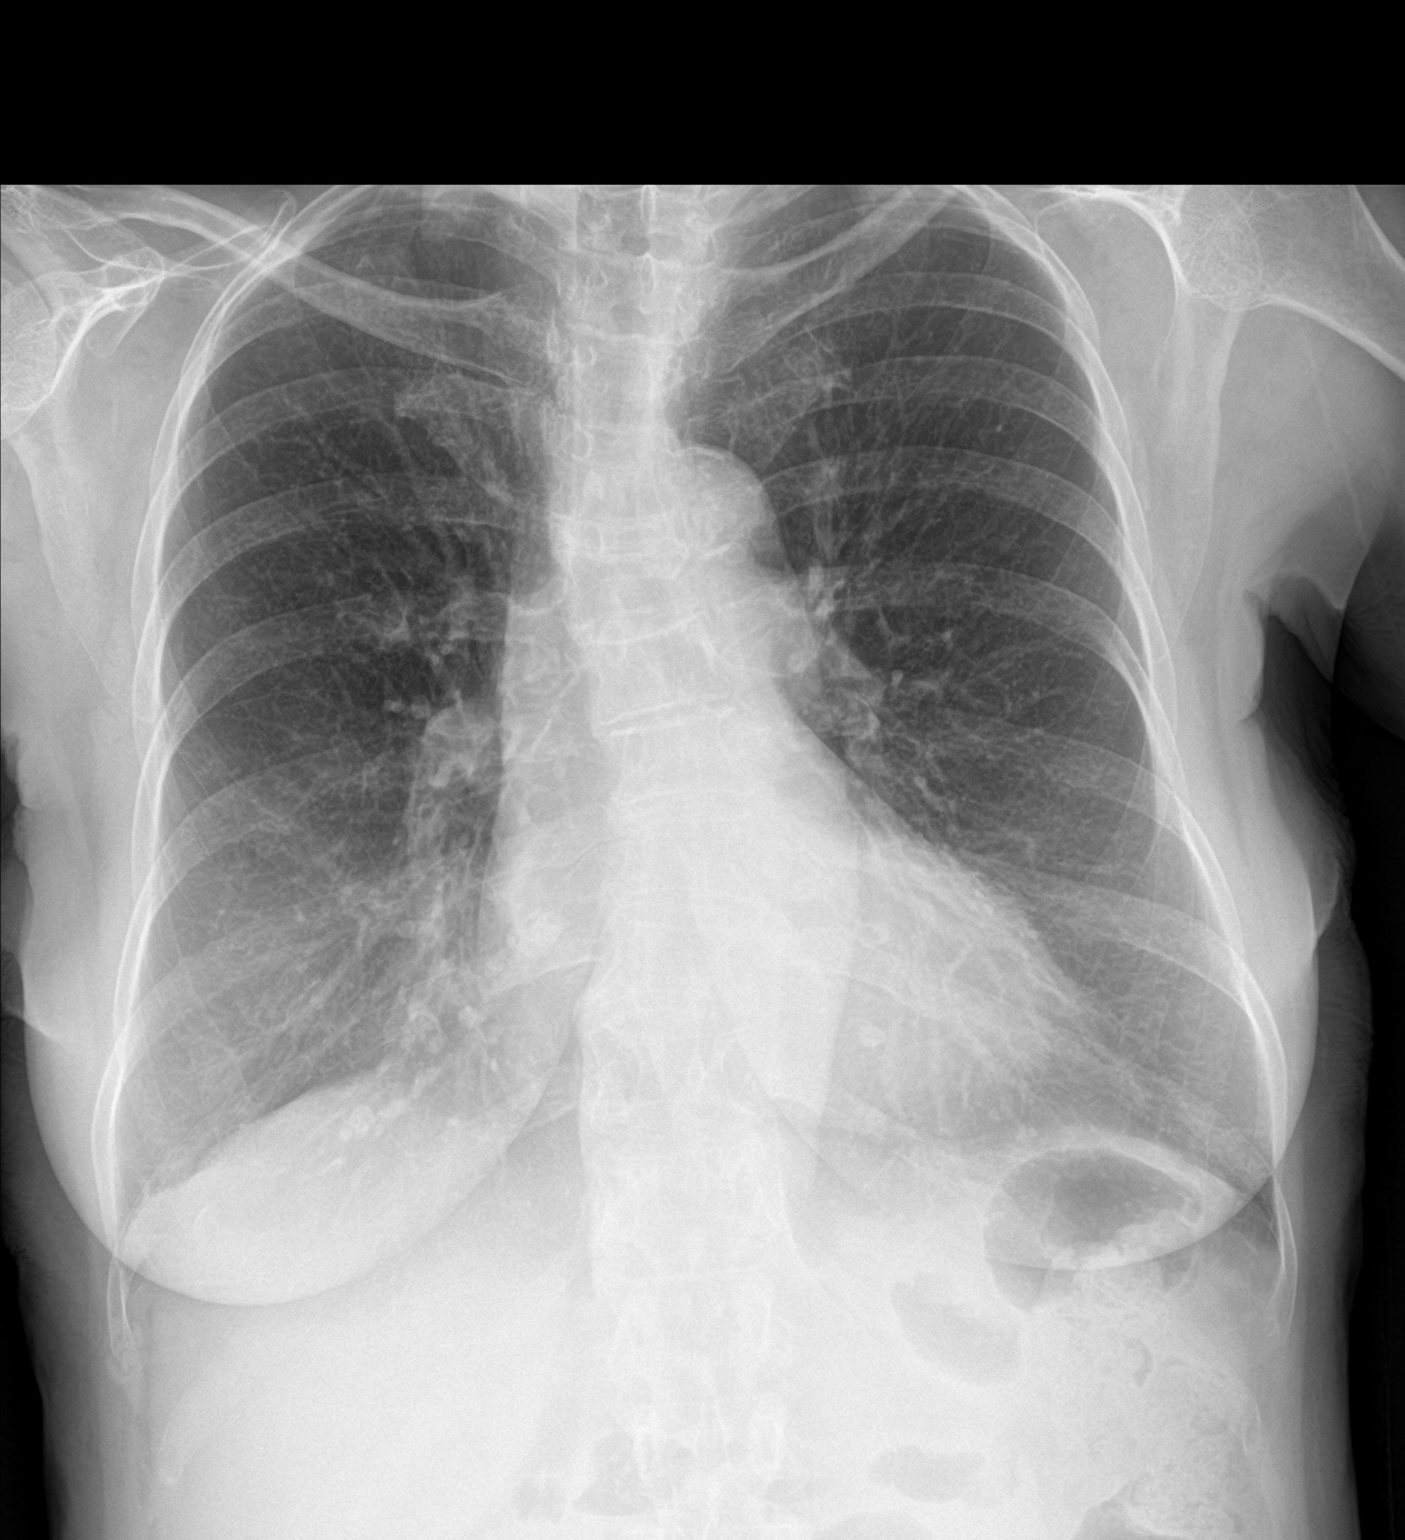

[chest lat]
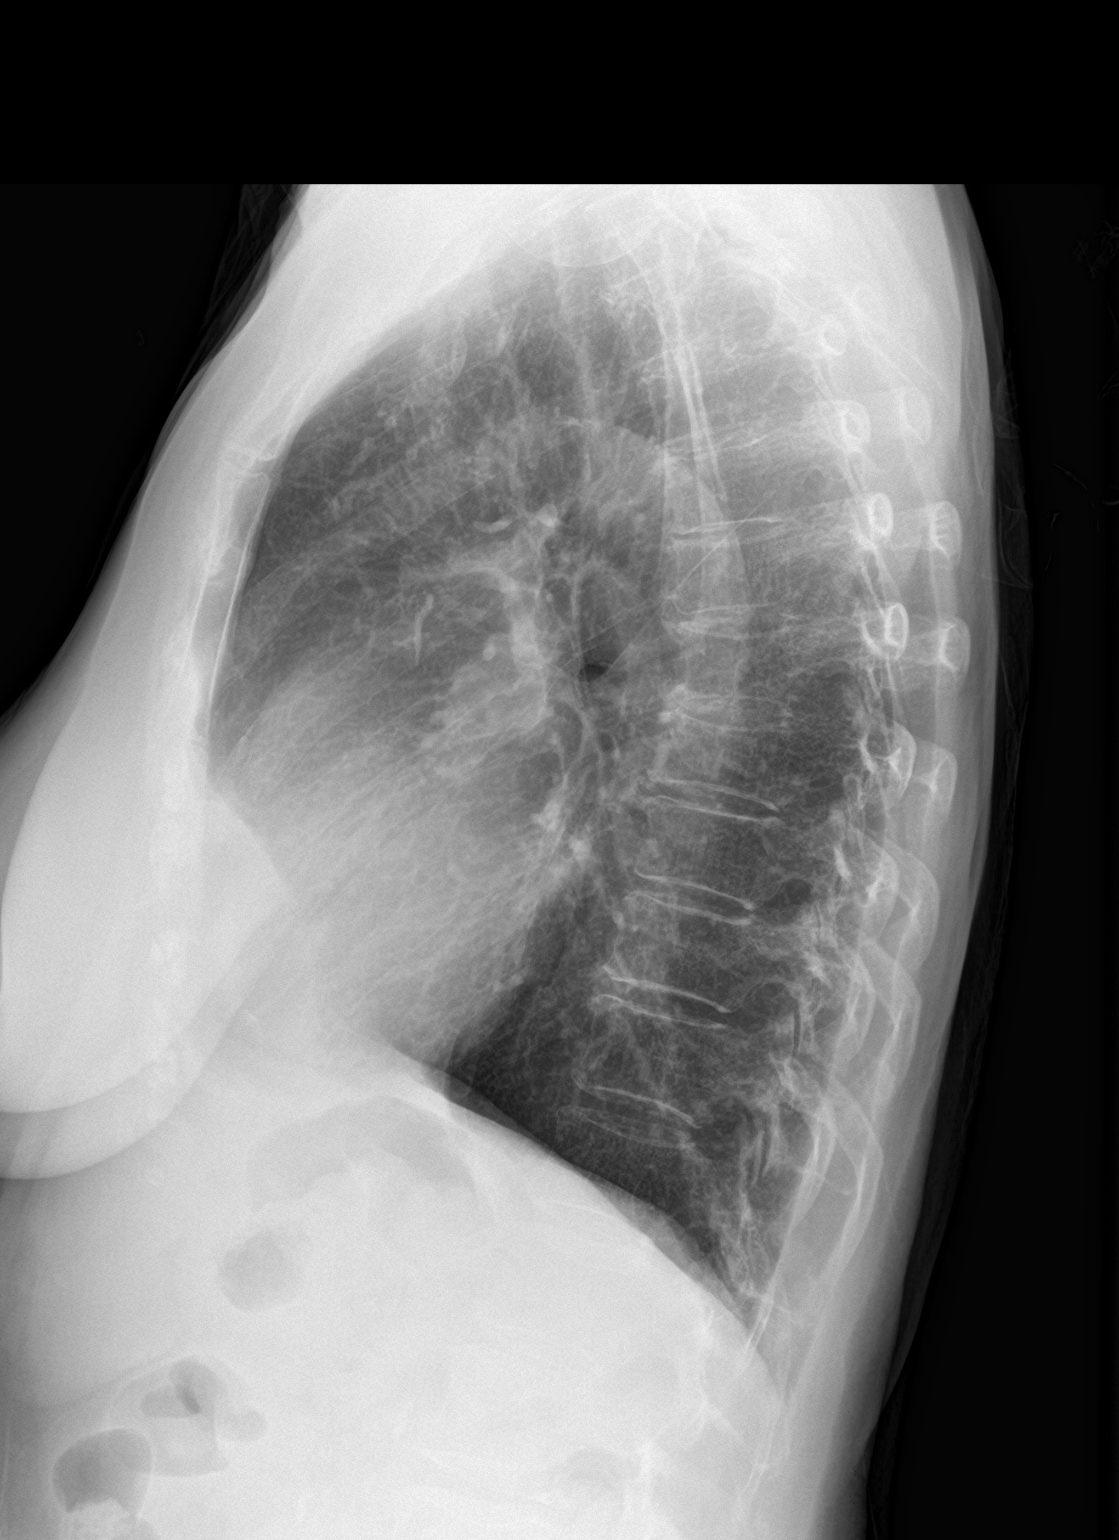

[2 of 2 positions shown; findings below may reference images not displayed]

FINDINGS: The heart size and mediastinal contours are within normal limits.
Atherosclerosis of thoracic aorta is noted. No pneumothorax or
pleural effusion is noted. Both lungs are clear. The visualized
skeletal structures are unremarkable.
IMPRESSION: No active cardiopulmonary disease.  Aortic atherosclerosis.

## 2020-04-23 DIAGNOSIS — I10 Essential (primary) hypertension: Secondary | ICD-10-CM | POA: Diagnosis present

## 2020-04-23 DIAGNOSIS — E782 Mixed hyperlipidemia: Secondary | ICD-10-CM | POA: Diagnosis present

## 2020-04-23 HISTORY — DX: Essential (primary) hypertension: I10

## 2020-04-23 HISTORY — DX: Mixed hyperlipidemia: E78.2

## 2020-06-04 ENCOUNTER — Ambulatory Visit: Payer: Medicare Other | Admitting: Cardiology

## 2020-06-04 ENCOUNTER — Other Ambulatory Visit: Payer: Self-pay

## 2020-06-04 ENCOUNTER — Encounter: Payer: Self-pay | Admitting: Cardiology

## 2020-06-04 VITALS — BP 145/59 | HR 95 | Temp 93.0°F | Ht 61.5 in | Wt 96.0 lb

## 2020-06-04 DIAGNOSIS — R0989 Other specified symptoms and signs involving the circulatory and respiratory systems: Secondary | ICD-10-CM

## 2020-06-04 DIAGNOSIS — J431 Panlobular emphysema: Secondary | ICD-10-CM

## 2020-06-04 DIAGNOSIS — F17200 Nicotine dependence, unspecified, uncomplicated: Secondary | ICD-10-CM

## 2020-06-04 DIAGNOSIS — R0609 Other forms of dyspnea: Secondary | ICD-10-CM

## 2020-06-04 DIAGNOSIS — I480 Paroxysmal atrial fibrillation: Secondary | ICD-10-CM

## 2020-06-04 DIAGNOSIS — I350 Nonrheumatic aortic (valve) stenosis: Secondary | ICD-10-CM

## 2020-06-04 DIAGNOSIS — R0600 Dyspnea, unspecified: Secondary | ICD-10-CM

## 2020-06-04 MED ORDER — NICOTINE 21 MG/24HR TD PT24: 21.0000 mg | MEDICATED_PATCH | Freq: Every day | TRANSDERMAL | 0 refills | Status: AC

## 2020-06-04 NOTE — Progress Notes (Signed)
Primary Physician/Referring:  Joya Gaskins, FNP  Patient ID: Kelly [name], female    DOB: 1940-05-08, 80 y.o.   MRN: [id number]  Chief Complaint  Patient presents with  . PAF  . Atrial Fibrillation   HPI:    Kelly [name]  is a 80 y.o. Caucasian female patient with paroxysmal atrial fibrillation on chronic Eliquis therapy, hypertension, hyperlipidemia, chronic back pain and lumbar stenosis with radiculopathy, ongoing tobacco use disorder with COPD with reactive airway disease referred to me for evaluation and management of paroxysmal atrial fibrillation and to establish care.  She has moved from [place] to [place] to be closer to her daughter in February 2022.  States that she has been diagnosed with atrial fibrillation about 3 to 4 years ago and has been on Eliquis without any bleeding diathesis.  Her main complaint is dyspnea on exertion and cough.  This is chronic, her last hospital admission in [place] with COPD exacerbation was a year ago.  She denies any known coronary artery disease, stroke.  She denies symptoms of claudication, TIA, leg edema, PND or orthopnea.  Past Medical History:  Diagnosis Date  . Anemia   . Anxiety   . B12 deficiency 05/2015   B12 was 184  . COPD (chronic obstructive pulmonary disease) ([place])   . Crohn's disease, small intestine ([place]) 02/19/2009  . Depression 10/2013   chronic recurrent major depressive disorder  . H/O vitamin D deficiency 08/04/2008  . Recurrent dislocation of hip, right    Past Surgical History:  Procedure Laterality Date  . JOINT REPLACEMENT     Right hip, 2015  . LAPAROSCOPY  06/03/2016   Duodenal ulcer repair   Family History  Problem Relation Age of Onset  . Heart disease Mother   . Alcohol abuse Mother   . Colon cancer Sister   . Breast cancer Sister   . Heart attack Brother   . Cancer Neg Hx   . Diabetes Neg Hx     Social History   Tobacco Use  . Smoking status: Current Every Day Smoker    Types:  Cigarettes  . Smokeless tobacco: Never Used  . Tobacco comment: trying to quit, wearing nicotine patch. Cautioned against smoking and wearing patch  Substance Use Topics  . Alcohol use: Yes   Marital Status: Married  ROS  Review of Systems  Cardiovascular: Positive for dyspnea on exertion. Negative for chest pain and leg swelling.  Respiratory: Positive for cough.   Gastrointestinal: Negative for melena.  Psychiatric/Behavioral: The patient is nervous/anxious.    Objective  Blood pressure (!) 145/59, [name] 95, temperature (!) 93 F (33.9 C), height 5' 1.5" (1.562 m), weight 96 lb (43.5 kg), SpO2 92 %. Body mass index is 17.85 kg/m.  Vitals with BMI 06/04/2020 06/04/2020 11/16/2016  Height - 5' 1.5" -  Weight - 96 lbs -  BMI - 81.01 -  Systolic 751 025 852  Diastolic 59 62 77  [name] 95 95 63     Physical Exam Constitutional:      General: She is not in acute distress.    Appearance: She is underweight. She is ill-appearing.  Neck:     Vascular: Carotid bruit (bilateral) present. No JVD.  Cardiovascular:     Rate and Rhythm: Normal rate and regular rhythm.     Pulses: Intact distal pulses.          Popliteal pulses are 1+ on the right side and 1+ on the left side.  Dorsalis pedis pulses are 1+ on the right side and 1+ on the left side.       Posterior tibial pulses are 1+ on the right side and 1+ on the left side.     Heart sounds: Murmur heard.   Harsh midsystolic murmur is present with a grade of 3/6 at the upper right sternal border and apex radiating to the neck. No gallop.   Pulmonary:     Effort: Pulmonary effort is normal.     Breath sounds: Wheezing (bilateral diffuse) and rales (bilateral diffuse) present.  Abdominal:     General: Bowel sounds are normal.     Palpations: Abdomen is soft.  Musculoskeletal:        General: No swelling.      Laboratory examination:   External labs:   Labs 05/22/2020: Hb 10.8/HCT 32.9, platelets 241, normal indicis.  Iron  studies revealed mild iron deficiency anemia.  TSH is normal at 3.28.  Total cholesterol 227, triglycerides 130, HDL 56, LDL 143.  Non-HDL cholesterol 171.  BUN 13, creatinine 0.66, potassium 4.3, EGFR 89 mL, serum glucose 105 mg.  Medications and allergies   Allergies  Allergen Reactions  . Bactrim [Sulfamethoxazole-Trimethoprim]   . Bee Venom       Current Outpatient Medications:  .  albuterol (VENTOLIN HFA) 108 (90 Base) MCG/ACT inhaler, Inhale 1 puff into the lungs every 6 (six) hours as needed for wheezing or shortness of breath., Disp: , Rfl:  .  apixaban (ELIQUIS) 2.5 MG TABS tablet, Take 2.5 mg by mouth 2 (two) times daily., Disp: , Rfl:  .  atorvastatin (LIPITOR) 20 MG tablet, Take 20 mg by mouth daily., Disp: , Rfl:  .  b complex vitamins tablet, Take 1 tablet by mouth daily., Disp: , Rfl:  .  calcium carbonate (CALCIUM ANTACID) 500 MG chewable tablet, Chew 1 tablet by mouth daily., Disp: , Rfl:  .  Cholecalciferol (VITAMIN D3) 2000 units TABS, Take 1 tablet by mouth daily., Disp: , Rfl:  .  diclofenac sodium (VOLTAREN) 1 % GEL, Apply 2 g topically 4 (four) times daily., Disp: 1 Tube, Rfl: 1 .  DULoxetine (CYMBALTA) 30 MG capsule, Take 30 mg by mouth daily., Disp: , Rfl:  .  ferrous sulfate 325 (65 FE) MG EC tablet, Take 1 tablet (325 mg total) by mouth daily with breakfast., Disp: 90 tablet, Rfl: 1 .  Fluticasone Furoate (ARNUITY ELLIPTA) 200 MCG/ACT AEPB, Inhale 1 puff into the lungs daily., Disp: 30 each, Rfl: 3 .  folic acid (FOLVITE) 1 MG tablet, Take 1 mg by mouth daily., Disp: , Rfl:  .  metoprolol tartrate (LOPRESSOR) 25 MG tablet, Take 25 mg by mouth 2 (two) times daily., Disp: , Rfl:  .  Multiple Vitamins-Iron (MULTIVITAMINS WITH IRON) TABS tablet, Take 1 tablet by mouth daily., Disp: , Rfl:  .  nicotine (NICODERM CQ - DOSED IN MG/24 HOURS) 21 mg/24hr patch, Place 1 patch (21 mg total) onto the skin daily., Disp: 28 patch, Rfl: 0 .  pantoprazole (PROTONIX) 40 MG  tablet, Take 40 mg by mouth daily., Disp: , Rfl:  .  pregabalin (LYRICA) 50 MG capsule, Take 50 mg by mouth 3 (three) times daily., Disp: , Rfl:  .  Spacer/Aero-Holding Chambers (VORTEX VALVED HOLDING CHAMBER) DEVI, by Does not apply route., Disp: , Rfl:  .  thiamine (VITAMIN B-1) 100 MG tablet, Take 100 mg by mouth daily., Disp: , Rfl:  .  gabapentin (NEURONTIN) 600 MG tablet, Take 1 tablet (  600 mg total) by mouth 3 (three) times daily., Disp: 90 tablet, Rfl: 1   Radiology:   No results found.  Cardiac Studies:   Echocardiogram 08/22/2016: - Left ventricle: The cavity size was normal. There was severe  concentric hypertrophy. Cordal SAM is noted. Systolic function   was vigorous. The estimated ejection fraction was in the range of  65% to 70%. There was dynamic obstruction during Valsalvain the mid cavity, with mid-cavity obliteration, a peak velocity of 421 cm/sec, and a peak gradient of 71 mm Hg. Doppler parameters are consistent with abnormal left ventricular relaxation (grade 1  diastolic dysfunction). The E/e&' ratio is >15, suggesting  elevated LV filling pressure. - Mitral valve: Mildly thickened leaflets. Cordal SAM is noted.   There was trivial regurgitation. - Left atrium: The atrium was normal in size. - Inferior vena cava: The vessel was normal in size. The   respirophasic diameter changes were in the normal range (>= 50%), consistent with normal central venous pressure.  Impressions: - LVEF 65-70%, severe concentric LVH with cordal SAM, dynamic   mid-cavitary gradient of 41 mmHg at rest that increases to 71   mmHg with valsalva.  Carotid artery duplex 07/27/2016:  Mild atherosclerotic disease of the common carotid bulb without hemodynamically significant stenosis. Antegrade vertebral artery flow.  EKG:     EKG 06/04/2020: Normal sinus rhythm at rate of 91 bpm, left atrial enlargement, normal axis.  Poor R progression, cannot exclude anteroseptal infarct old.  LVH with  repolarization abnormality, cannot exclude inferior and lateral ischemia.      Assessment     ICD-10-CM   1. Paroxysmal atrial fibrillation (HCC)  I48.0 EKG 12-Lead    PCV ECHOCARDIOGRAM COMPLETE    PCV MYOCARDIAL PERFUSION WITH LEXISCAN  2. Dyspnea on exertion  R06.00 PCV MYOCARDIAL PERFUSION WITH LEXISCAN  3. Nonrheumatic aortic valve stenosis  I35.0   4. Bilateral carotid bruits  R09.89 PCV CAROTID DUPLEX (BILATERAL)  5. 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 Tobacco use disorder  F17.200 nicotine (NICODERM CQ - DOSED IN MG/24 HOURS) 21 mg/24hr patch    CHA2DS2-VASc Score is 5.  Yearly risk of stroke: 7.2% (F, A, HTN, Vasc Dz).  Score of 1=0.6; 2=2.2; 3=3.2; 4=4.8; 5=7.2; 6=9.8; 7=>9.8) -(CHF; HTN; vasc disease DM,  Female = 1; Age <65 =0; 65-74 = 1,  >75 =2; stroke/embolism= 2).    Meds ordered this encounter  Medications  . nicotine (NICODERM CQ - DOSED IN MG/24 HOURS) 21 mg/24hr patch    Sig: Place 1 patch (21 mg total) onto the skin daily.    Dispense:  28 patch    Refill:  0   Orders Placed This Encounter  Procedures  . PCV MYOCARDIAL PERFUSION WITH LEXISCAN    Standing Status:   Future    Standing Expiration Date:   08/04/2020  . EKG 12-Lead  . PCV ECHOCARDIOGRAM COMPLETE    Standing Status:   Future    Standing Expiration Date:   06/04/2021   Recommendations:   Kashana Breach is a 80 y.o. Caucasian female with paroxysmal atrial fibrillation on chronic Eliquis therapy, hypertension, hyperlipidemia, chronic back pain and lumbar stenosis with radiculopathy, ongoing tobacco use disorder with COPD with reactive airway disease referred to me for evaluation and management of paroxysmal atrial fibrillation and to establish care.  She has moved from [place] to [place] to be closer to her daughter in February 2022.  Patient's examination reveals prominent aortic stenotic murmur, no clinical evidence of heart  failure, also very prominent bilateral carotid bruit.  I have  recommended an echocardiogram and also carotid artery duplex.  Patient states she has not had a stress test previously, and in view of worsening dyspnea on exertion which is probably related to underlying COPD but in view of vascular disease noted by physical exam, will obtain Lexiscan nuclear stress test, patient unable to exercise on the treadmill due to dyspnea. 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  She has now been started on appropriate medical therapy including statins and beta-blocker, she is on Eliquis for paroxysmal atrial fibrillation as her CHA2DS2-VASc risk score is extremely high.  She will need close monitoring of her lipids now that she is back on lipid lowering therapy.  If severe LVOT obstruction is confirmed, she may benefit from use of diet Apyrid and calcium channel blocker like diltiazem to slow her heart rate down.  We discussed regarding smoking, for the first time she appears to be motivated in smoking cessation.  She request that I send him for nicotine patches.  Her daughter who the patient lives with also states that she is extremely anxious in view of recent move and her household being very busy, I advised instead of using any form of diazepam, would recommend increasing duloxetine to 60 mg daily which may help also with smoking cessation and advised her to start taking 60 mg for now and will request Ms. Randol Kern, PA-C to send in a new Rx.  I would like to see her back in 4 to 6 weeks for follow-up.  This was a 60-minute office visit encounter in reconciliation of her external labs and investigations and evaluation of complex medical issues.     Adrian Prows, MD, [facility] 06/04/2020, 4:25 PM Office: [phone]

## 2020-06-11 ENCOUNTER — Other Ambulatory Visit: Payer: Medicare Other

## 2020-06-17 ENCOUNTER — Ambulatory Visit: Payer: Medicare Other

## 2020-06-17 ENCOUNTER — Other Ambulatory Visit: Payer: Self-pay

## 2020-06-17 DIAGNOSIS — I6523 Occlusion and stenosis of bilateral carotid arteries: Secondary | ICD-10-CM

## 2020-06-17 DIAGNOSIS — I480 Paroxysmal atrial fibrillation: Secondary | ICD-10-CM

## 2020-06-17 DIAGNOSIS — R0989 Other specified symptoms and signs involving the circulatory and respiratory systems: Secondary | ICD-10-CM

## 2020-06-18 NOTE — Progress Notes (Addendum)
Echocardiogram 06/17/2020: Left ventricle cavity is normal in size. Severe concentric hypertrophy of the left ventricle. Normal global wall motion. Normal LV systolic function with EF 58%.  Left atrial cavity is severely dilated. Trileaflet aortic valve.  Mild (Grade I) aortic regurgitation. LVOT max gradient 14 mmHg likely due to LVOT obstruction and not valvular aortic stenosis. No obvious SAM. Mildly restricted mitral valve leaflets. Moderate to severe mitral regurgitation. No evidence of pulmonary hypertension. 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 Previous echocardiogram report in 2018 noted only trivial mitral regurgitation. No other significant change noted.   Chest x-ray PA and lateral view 06/11/2019: Cardiomegaly with atherosclerotic changes of the aorta. New small left pleural effusion. COPD changes with fine reticular prominence and hyperinflation.  Echocardiogram 11/09/2018: Hyperdynamic LV, systolic anterior motion of the mitral valve leaflet with moderate to severe mitral regurgitation.  Mild mitral stenosis.  I have obtained prior evaluations from [facility] in [place], [place] with phone number [phone] and fax [phone] from Dr. Duanne Limerick, essentially received chest x-ray, EKG and his last office note from 11/08/2018.  I had personally discussed patient's presentation with him.  I do not see any stress test or cardiac catheterization.  I will discuss this on her next office visit.   Adrian Prows, MD, [facility] 06/18/2020, 11:32 PM Office: [phone] Fax: [phone] Pager: [phone]

## 2020-06-22 ENCOUNTER — Ambulatory Visit: Payer: Medicare Other

## 2020-06-22 ENCOUNTER — Other Ambulatory Visit: Payer: Self-pay

## 2020-06-22 DIAGNOSIS — I480 Paroxysmal atrial fibrillation: Secondary | ICD-10-CM

## 2020-06-22 DIAGNOSIS — R0600 Dyspnea, unspecified: Secondary | ICD-10-CM

## 2020-06-22 DIAGNOSIS — R0609 Other forms of dyspnea: Secondary | ICD-10-CM

## 2020-06-28 NOTE — Progress Notes (Signed)
Normal stress test.  Lexiscan Tetrofosmin stress test 06/22/2020: Lexiscan nuclear stress test performed using 1-day protocol. Normal myocardial perfusion. Stress LVEF calculated 44%, but visually appears normal. Low risk study.

## 2020-06-28 NOTE — Progress Notes (Signed)
Let her know there is blockages in both carotid arteries and will discuss on her visit soon, nothing to be done for now and will recheck in 6 months  Carotid artery duplex 06/17/2020: Stenosis in the right internal carotid artery (50-69%). Stenosis in the right external carotid artery (<50%). Stenosis in the left internal carotid artery (>=70%).  The left PSV internal/common carotid artery ratio of 4.48 is consistent with a stenosis of >70%. Left common carotid bulb with <50% calcific stenosis. Stenosis in the left external carotid artery (>50%). Antegrade right vertebral artery flow. Antegrade left vertebral artery flow. Follow up in six months is appropriate if clinically indicated.

## 2020-06-29 NOTE — Progress Notes (Signed)
Called and spoke with patient regarding her stress test results.

## 2020-06-29 NOTE — Progress Notes (Signed)
Called and spoke with patient regarding her CAD results.

## 2020-07-16 ENCOUNTER — Ambulatory Visit: Payer: Medicare Other | Admitting: Cardiology

## 2020-07-22 ENCOUNTER — Ambulatory Visit: Payer: Medicare Other | Admitting: Cardiology

## 2020-08-13 ENCOUNTER — Encounter: Payer: Self-pay | Admitting: Cardiology

## 2020-08-13 ENCOUNTER — Other Ambulatory Visit: Payer: Self-pay

## 2020-08-13 ENCOUNTER — Ambulatory Visit: Payer: Medicare Other | Admitting: Cardiology

## 2020-08-13 VITALS — BP 164/76 | HR 90 | Temp 97.9°F | Resp 17 | Ht 61.5 in | Wt 98.8 lb

## 2020-08-13 DIAGNOSIS — I480 Paroxysmal atrial fibrillation: Secondary | ICD-10-CM

## 2020-08-13 DIAGNOSIS — E7800 Pure hypercholesterolemia, unspecified: Secondary | ICD-10-CM

## 2020-08-13 DIAGNOSIS — I6523 Occlusion and stenosis of bilateral carotid arteries: Secondary | ICD-10-CM

## 2020-08-13 DIAGNOSIS — J431 Panlobular emphysema: Secondary | ICD-10-CM | POA: Insufficient documentation

## 2020-08-13 DIAGNOSIS — I10 Essential (primary) hypertension: Secondary | ICD-10-CM

## 2020-08-13 DIAGNOSIS — F17200 Nicotine dependence, unspecified, uncomplicated: Secondary | ICD-10-CM

## 2020-08-13 HISTORY — DX: Paroxysmal atrial fibrillation: I48.0

## 2020-08-13 HISTORY — DX: Panlobular emphysema: J43.1

## 2020-08-13 MED ORDER — DILTIAZEM HCL ER COATED BEADS 180 MG PO CP24: 180.0000 mg | ORAL_CAPSULE | Freq: Every day | ORAL | 2 refills | Status: DC

## 2020-08-13 NOTE — Progress Notes (Signed)
Primary Physician/Referring:  Joya Gaskins, FNP  Patient ID: Kelly [name], female    DOB: 15-Feb-1940, 80 y.o.   MRN: [id number]  Chief Complaint  Patient presents with   Atrial Fibrillation   Hypertension   carotid bruit   Shortness of Breath   HPI:    Kelly [name]  is a 80 y.o. Caucasian female patient with paroxysmal atrial fibrillation on chronic Eliquis therapy, hypertension, hyperlipidemia, chronic back pain and lumbar stenosis with radiculopathy, ongoing tobacco use disorder with COPD with reactive airway disease referred to me for evaluation and management of paroxysmal atrial fibrillation and to establish care.  She has moved from [place] to [place] to be closer to her daughter in February 2022.  States that she has been diagnosed with atrial fibrillation about 3 to 4 years ago and has been on Eliquis without any bleeding diathesis.  She has chronic dyspnea on exertion and wheezing due to underlying COPD.  She denies chest pain.  No PND or orthopnea.  She underwent cardiac stress testing, echocardiogram and carotid duplex and presents for follow-up.  She is made significant lifestyle changes and has reduced smoking significantly and she is presently wearing a nicotine patch and is motivated to completely quit smoking.  No other specific complaints today.  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, small intestine ([place]) 02/19/2009   Depression 10/2013   chronic recurrent major depressive disorder   H/O vitamin D deficiency 08/04/2008   Recurrent dislocation of hip, right    Past Surgical History:  Procedure Laterality Date   JOINT REPLACEMENT     Right hip, 2015   LAPAROSCOPY  06/03/2016   Duodenal ulcer repair   Family History  Problem Relation Age of Onset   Heart disease Mother    Alcohol abuse Mother    Colon cancer  Sister    Breast cancer Sister    Heart attack Brother    Cancer Neg Hx    Diabetes Neg Hx     Social History   Tobacco Use   Smoking status: Every Day    Types: Cigarettes   Smokeless tobacco: Never   Tobacco comments:    trying to quit, wearing nicotine patch. Cautioned against smoking and wearing patch. Smokes when patch is taken off.  Substance Use Topics   Alcohol use: Yes    Comment: OCC   Marital Status: Married  ROS  Review of Systems  Cardiovascular:  Positive for dyspnea on exertion. Negative for chest pain and leg swelling.  Respiratory:  Positive for cough and wheezing.   Musculoskeletal:  Positive for arthritis (right hip).  Gastrointestinal:  Negative for melena.  Psychiatric/Behavioral:  The patient is nervous/anxious.   Objective  Blood pressure (!) 164/76, [name] 90, temperature 97.9 F (36.6 C), temperature source Temporal, resp. rate 17, height 5' 1.5" (1.562 m), weight 98 lb 12.8 oz (44.8 kg), SpO2 97 %. Body mass index is 18.37 kg/m.  Vitals with BMI 08/13/2020 08/13/2020 06/04/2020  Height - 5' 1.5" -  Weight - 98 lbs 13 oz -  BMI - 18.56 -  Systolic 314 970 263  Diastolic 76 72 59  [name] 90 96 95     Physical Exam Constitutional:      General: She is not in acute distress.    Appearance: She is underweight. She  is ill-appearing.  Neck:     Vascular: Carotid bruit (bilateral) present. No JVD.  Cardiovascular:     Rate and Rhythm: Normal rate and regular rhythm.     Pulses:          Femoral pulses are 1+ on the right side and 2+ on the left side.      Popliteal pulses are 0 on the right side and 2+ on the left side.       Dorsalis pedis pulses are 0 on the right side and 0 on the left side.       Posterior tibial pulses are 0 on the right side and 0 on the left side.     Heart sounds: Murmur heard.  Harsh midsystolic murmur is present with a grade of 3/6 at the upper right sternal border and apex radiating to the neck.    No gallop.  Pulmonary:      Effort: Pulmonary effort is normal.     Breath sounds: Wheezing (bilateral diffuse) and rales (bilateral diffuse) present.  Abdominal:     General: Bowel sounds are normal.     Palpations: Abdomen is soft.  Musculoskeletal:        General: No swelling.     Laboratory examination:   External labs:   Labs 05/22/2020: Hb 10.8/HCT 32.9, platelets 241, normal indicis.  Iron studies revealed mild iron deficiency anemia.  TSH is normal at 3.28.  Total cholesterol 227, triglycerides 130, HDL 56, LDL 143.  Non-HDL cholesterol 171.  BUN 13, creatinine 0.66, potassium 4.3, EGFR 89 mL, serum glucose 105 mg.  Medications and allergies   Allergies  Allergen Reactions   Bactrim [Sulfamethoxazole-Trimethoprim]    Bee Venom     Current Outpatient Medications on File Prior to Visit  Medication Sig Dispense Refill   albuterol (VENTOLIN HFA) 108 (90 Base) MCG/ACT inhaler Inhale 1 puff into the lungs every 6 (six) hours as needed for wheezing or shortness of breath.     apixaban (ELIQUIS) 2.5 MG TABS tablet Take 2.5 mg by mouth 2 (two) times daily.     atorvastatin (LIPITOR) 20 MG tablet Take 20 mg by mouth daily.     b complex vitamins tablet Take 1 tablet by mouth daily.     calcium carbonate (TUMS - DOSED IN MG ELEMENTAL CALCIUM) 500 MG chewable tablet Chew 1 tablet by mouth daily.     Cholecalciferol (VITAMIN D3) 2000 units TABS Take 1 tablet by mouth daily.     diclofenac sodium (VOLTAREN) 1 % GEL Apply 2 g topically 4 (four) times daily. 1 Tube 1   DULoxetine (CYMBALTA) 30 MG capsule Take 30 mg by mouth daily.     ferrous sulfate 325 (65 FE) MG EC tablet Take 1 tablet (325 mg total) by mouth daily with breakfast. 90 tablet 1   Fluticasone Furoate (ARNUITY ELLIPTA) 200 MCG/ACT AEPB Inhale 1 puff into the lungs daily. 30 each 3   folic acid (FOLVITE) 1 MG tablet Take 1 mg by mouth daily.     metoprolol tartrate (LOPRESSOR) 25 MG tablet Take 25 mg by mouth 2 (two) times daily.      Multiple Vitamins-Iron (MULTIVITAMINS WITH IRON) TABS tablet Take 1 tablet by mouth daily.     nicotine (NICODERM CQ - DOSED IN MG/24 HOURS) 21 mg/24hr patch Place 1 patch (21 mg total) onto the skin daily. 28 patch 0   pantoprazole (PROTONIX) 40 MG tablet Take 40 mg by mouth daily.  pregabalin (LYRICA) 50 MG capsule Take 50 mg by mouth 3 (three) times daily.     Spacer/Aero-Holding Chambers (VORTEX VALVED HOLDING CHAMBER) DEVI by Does not apply route.     thiamine (VITAMIN B-1) 100 MG tablet Take 100 mg by mouth daily.     acetaminophen-codeine (TYLENOL #3) 300-30 MG tablet Take 1 tablet by mouth every 6 (six) hours as needed.     No current facility-administered medications on file prior to visit.    Medications after today's encounter: Current Outpatient Medications  Medication Instructions   acetaminophen-codeine (TYLENOL #3) 300-30 MG tablet 1 tablet, Oral, Every 6 hours PRN   albuterol (VENTOLIN HFA) 108 (90 Base) MCG/ACT inhaler 1 puff, Inhalation, Every 6 hours PRN   apixaban (ELIQUIS) 2.5 mg, Oral, 2 times daily   atorvastatin (LIPITOR) 20 mg, Oral, Daily   b complex vitamins tablet 1 tablet, Oral, Daily   calcium carbonate (TUMS - DOSED IN MG ELEMENTAL CALCIUM) 500 MG chewable tablet 1 tablet, Oral, Daily   Cholecalciferol (VITAMIN D3) 2000 units TABS 1 tablet, Oral, Daily   diclofenac sodium (VOLTAREN) 2 g, Topical, 4 times daily   diltiazem (CARDIZEM CD) 180 mg, Oral, Daily   DULoxetine (CYMBALTA) 30 mg, Oral, Daily   ferrous sulfate 325 mg, Oral, Daily with breakfast   Fluticasone Furoate (ARNUITY ELLIPTA) 200 MCG/ACT AEPB 1 puff, Inhalation, Daily   folic acid (FOLVITE) 1 mg, Oral, Daily   metoprolol tartrate (LOPRESSOR) 25 mg, Oral, 2 times daily   Multiple Vitamins-Iron (MULTIVITAMINS WITH IRON) TABS tablet 1 tablet, Oral, Daily   nicotine (NICODERM CQ - DOSED IN MG/24 HOURS) 21 mg, Transdermal, Daily   pantoprazole (PROTONIX) 40 mg, Oral, Daily   pregabalin (LYRICA)  50 mg, Oral, 3 times daily   Spacer/Aero-Holding Chambers (VORTEX VALVED HOLDING CHAMBER) DEVI Does not apply   thiamine (VITAMIN B-1) 100 mg, Oral, Daily    Radiology:   Chest x-ray PA and lateral view 06/11/2019: Cardiomegaly with atherosclerotic changes of the aorta. New small left pleural effusion. COPD changes with fine reticular prominence and hyperinflation.  Cardiac Studies:   Echocardiogram 08/22/2016: - Left ventricle: The cavity size was normal. There was severe  concentric hypertrophy. Cordal SAM is noted. Systolic function   was vigorous. The estimated ejection fraction was in the range of  65% to 70%. There was dynamic obstruction during Valsalvain the mid cavity, with mid-cavity obliteration, a peak velocity of 421 cm/sec, and a peak gradient of 71 mm Hg. Doppler parameters are consistent with abnormal left ventricular relaxation (grade 1  diastolic dysfunction). The E/e&' ratio is >15, suggesting  elevated LV filling pressure. - Mitral valve: Mildly thickened leaflets. Cordal SAM is noted.   There was trivial regurgitation. - Left atrium: The atrium was normal in size. - Inferior vena cava: The vessel was normal in size. The   respirophasic diameter changes were in the normal range (>= 50%), consistent with normal central venous pressure.   Impressions: - LVEF 65-70%, severe concentric LVH with cordal SAM, dynamic   mid-cavitary gradient of 41 mmHg at rest that increases to 71   mmHg with valsalva.  PCV MYOCARDIAL PERFUSION WITH LEXISCAN 06/22/2020 Lexiscan nuclear stress test performed using 1-day protocol. Normal myocardial perfusion. Stress LVEF calculated 44%, but visually appears normal. Low risk study.   PCV ECHOCARDIOGRAM COMPLETE 06/17/2020 Left ventricle cavity is normal in size. Severe concentric hypertrophy of the left ventricle. Normal global wall motion. Normal LV systolic function with EF 58%. Left atrial cavity is severely  dilated. Trileaflet aortic  valve.  Mild (Grade I) aortic regurgitation. LVOT max gradient 14 mmHg likely due to LVOT obstruction and not valvular aortic stenosis. No obvious SAM. Mildly restricted mitral valve leaflets. Moderate to severe mitral regurgitation. No evidence of pulmonary hypertension. 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 Previous echocardiogram report in 2018 noted only trivial mitral regurgitation. No other significant change noted.    Carotid artery duplex 06/17/2020: Stenosis in the right internal carotid artery (50-69%). Stenosis in the right external carotid artery (<50%). Stenosis in the left internal carotid artery (>=70%).  The left PSV internal/common carotid artery ratio of 4.48 is consistent with a stenosis of >70%. Left common carotid bulb with <50% calcific stenosis. Stenosis in the left external carotid artery (>50%). Antegrade right vertebral artery flow. Antegrade left vertebral artery flow. Follow up in six months is appropriate if clinically indicated.  EKG:   EKG 08/13/2020: Normal sinus rhythm at rate of 93 bpm, left atrial enlargement, normal axis.  Anteroseptal infarct old.  LVH with repolarization abnormality, cannot exclude lateral ischemia.  No significant change from 06/04/2020.     Assessment     ICD-10-CM   1. Paroxysmal atrial fibrillation (HCC)  I48.0 EKG 12-Lead    2. Asymptomatic bilateral carotid artery stenosis  I65.23     3. 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 Tobacco use disorder  F17.200     5. Primary hypertension  I10 diltiazem (CARDIZEM CD) 180 MG 24 hr capsule    6. Hypercholesteremia  E78.00 Lipid Panel With LDL/HDL Ratio      CHA2DS2-VASc Score is 5.  Yearly risk of stroke: 7.2% (F, A, HTN, Vasc Dz).  Score of 1=0.6; 2=2.2; 3=3.2; 4=4.8; 5=7.2; 6=9.8; 7=>9.8) -(CHF; HTN; vasc disease DM,  Female = 1; Age <65 =0; 65-74 = 1,  >75 =2; stroke/embolism= 2).    Meds ordered this encounter  Medications   diltiazem (CARDIZEM  CD) 180 MG 24 hr capsule    Sig: Take 1 capsule (180 mg total) by mouth daily.    Dispense:  30 capsule    Refill:  2    Orders Placed This Encounter  Procedures   Lipid Panel With LDL/HDL Ratio   EKG 12-Lead   Recommendations:   Amere Iott is a 80 y.o. Caucasian female with paroxysmal atrial fibrillation on chronic Eliquis therapy, hypertension, hyperlipidemia, chronic back pain and lumbar stenosis with radiculopathy, ongoing tobacco use disorder with COPD with reactive airway disease referred to me for evaluation and management of paroxysmal atrial fibrillation and to establish care.  She has moved from [place] to [place] to be closer to her daughter in February 2022.  I reviewed the results of the echocardiogram, fortunately she does not have critical aortic stenosis.  She has LVOT gradient due to severe LVH from hypertension with hypertensive heart disease.  In view of LVOT gradient, elevated heart rate, probably related to underlying COPD, I have added diltiazem CD1 80 mg daily for heart rate control and blood pressure control and would like to reevaluate her heart rate and blood pressure in her next office visit in 6 weeks.  I would like to reestablish her baseline lipids, if LDL is still elevated to >70, will add Zetia 10 mg daily.  She will obtain lipids today.  With regard to stress test, low risk stress test.  Continue primary prevention.  Physical examination is also consistent with severe peripheral arterial disease, she has markedly reduced pulses in her right lower extremity.  Her right hip pain could be a combination of arthritis and also PAD.  Advised her to go for a walk on a daily basis for at least 10 minutes to 15 minutes.  Carotid artery stenosis discussed with the patient, she will need continued surveillance.  Aggressive lipid status control is very important.  She is also trying her very best to completely quit smoking in fact she has reduced smoking significantly  and she is presently wearing the nicotine patch.  She is also gained some weight.  She looks much healthier today than she did just few weeks ago when I first saw her.    Adrian Prows, MD, [facility] 08/13/2020, 11:09 AM Office: [phone]

## 2020-09-13 ENCOUNTER — Emergency Department (HOSPITAL_COMMUNITY): Payer: Medicare Other

## 2020-09-13 ENCOUNTER — Encounter (HOSPITAL_COMMUNITY): Payer: Self-pay | Admitting: Emergency Medicine

## 2020-09-13 ENCOUNTER — Observation Stay (HOSPITAL_COMMUNITY)
Admission: EM | Admit: 2020-09-13 | Discharge: 2020-09-14 | Disposition: A | Discharge status: Home / Self Care | Payer: Medicare Other | Attending: Internal Medicine | Admitting: Internal Medicine

## 2020-09-13 ENCOUNTER — Other Ambulatory Visit: Payer: Self-pay

## 2020-09-13 DIAGNOSIS — I480 Paroxysmal atrial fibrillation: Secondary | ICD-10-CM | POA: Diagnosis not present

## 2020-09-13 DIAGNOSIS — E039 Hypothyroidism, unspecified: Secondary | ICD-10-CM | POA: Diagnosis not present

## 2020-09-13 DIAGNOSIS — Z7901 Long term (current) use of anticoagulants: Secondary | ICD-10-CM | POA: Insufficient documentation

## 2020-09-13 DIAGNOSIS — I5032 Chronic diastolic (congestive) heart failure: Secondary | ICD-10-CM | POA: Diagnosis not present

## 2020-09-13 DIAGNOSIS — E782 Mixed hyperlipidemia: Secondary | ICD-10-CM | POA: Diagnosis present

## 2020-09-13 DIAGNOSIS — Z79899 Other long term (current) drug therapy: Secondary | ICD-10-CM | POA: Diagnosis not present

## 2020-09-13 DIAGNOSIS — Z96641 Presence of right artificial hip joint: Secondary | ICD-10-CM | POA: Diagnosis not present

## 2020-09-13 DIAGNOSIS — I10 Essential (primary) hypertension: Secondary | ICD-10-CM | POA: Diagnosis not present

## 2020-09-13 DIAGNOSIS — I110 Hypertensive heart disease with heart failure: Secondary | ICD-10-CM | POA: Insufficient documentation

## 2020-09-13 DIAGNOSIS — J441 Chronic obstructive pulmonary disease with (acute) exacerbation: Secondary | ICD-10-CM

## 2020-09-13 DIAGNOSIS — I509 Heart failure, unspecified: Secondary | ICD-10-CM

## 2020-09-13 DIAGNOSIS — Z20822 Contact with and (suspected) exposure to covid-19: Secondary | ICD-10-CM | POA: Insufficient documentation

## 2020-09-13 DIAGNOSIS — F17200 Nicotine dependence, unspecified, uncomplicated: Secondary | ICD-10-CM | POA: Diagnosis present

## 2020-09-13 DIAGNOSIS — I517 Cardiomegaly: Secondary | ICD-10-CM | POA: Diagnosis present

## 2020-09-13 DIAGNOSIS — J9601 Acute respiratory failure with hypoxia: Principal | ICD-10-CM | POA: Diagnosis present

## 2020-09-13 DIAGNOSIS — R0602 Shortness of breath: Secondary | ICD-10-CM | POA: Diagnosis present

## 2020-09-13 DIAGNOSIS — J449 Chronic obstructive pulmonary disease, unspecified: Secondary | ICD-10-CM | POA: Diagnosis present

## 2020-09-13 LAB — BASIC METABOLIC PANEL
Anion gap: 7 (ref 5–15)
BUN: 15 mg/dL (ref 8–23)
CO2: 27 mmol/L (ref 22–32)
Calcium: 8.7 mg/dL — ABNORMAL LOW (ref 8.9–10.3)
Chloride: 103 mmol/L (ref 98–111)
Creatinine, Ser: 0.78 mg/dL (ref 0.44–1.00)
GFR, Estimated: >60 mL/min (ref >60)
Glucose, Bld: 146 mg/dL — ABNORMAL HIGH (ref 70–99)
Potassium: 3.7 mmol/L (ref 3.5–5.1)
Sodium: 137 mmol/L (ref 135–145)

## 2020-09-13 LAB — CBC WITH DIFFERENTIAL/PLATELET
Abs Immature Granulocytes: 0.08 10*3/uL — ABNORMAL HIGH (ref 0.00–0.07)
Abs Immature Granulocytes: 0.1 10*3/uL — ABNORMAL HIGH (ref 0.00–0.07)
Basophils Absolute: 0 10*3/uL (ref 0.0–0.1)
Basophils Absolute: 0 10*3/uL (ref 0.0–0.1)
Basophils Relative: 0 %
Basophils Relative: 0 %
Eosinophils Absolute: 0.1 10*3/uL (ref 0.0–0.5)
Eosinophils Absolute: 0 10*3/uL (ref 0.0–0.5)
Eosinophils Relative: 1 %
Eosinophils Relative: 0 %
HCT: 30.7 % — ABNORMAL LOW (ref 36.0–46.0)
HCT: 30.7 % — ABNORMAL LOW (ref 36.0–46.0)
Hemoglobin: 9.9 g/dL — ABNORMAL LOW (ref 12.0–15.0)
Hemoglobin: 10.1 g/dL — ABNORMAL LOW (ref 12.0–15.0)
Immature Granulocytes: 1 %
Immature Granulocytes: 1 %
Lymphocytes Relative: 10 %
Lymphocytes Relative: 3 %
Lymphs Abs: 1.2 10*3/uL (ref 0.7–4.0)
Lymphs Abs: 0.5 10*3/uL — ABNORMAL LOW (ref 0.7–4.0)
MCH: 31.7 pg (ref 26.0–34.0)
MCH: 32.4 pg (ref 26.0–34.0)
MCHC: 32.2 g/dL (ref 30.0–36.0)
MCHC: 32.9 g/dL (ref 30.0–36.0)
MCV: 98.4 fL (ref 80.0–100.0)
MCV: 98.4 fL (ref 80.0–100.0)
Monocytes Absolute: 1 10*3/uL (ref 0.1–1.0)
Monocytes Absolute: 0.4 10*3/uL (ref 0.1–1.0)
Monocytes Relative: 8 %
Monocytes Relative: 2 %
Neutro Abs: 10.4 10*3/uL — ABNORMAL HIGH (ref 1.7–7.7)
Neutro Abs: 15 10*3/uL — ABNORMAL HIGH (ref 1.7–7.7)
Neutrophils Relative %: 80 %
Neutrophils Relative %: 94 %
Platelets: 141 10*3/uL — ABNORMAL LOW (ref 150–400)
Platelets: 143 10*3/uL — ABNORMAL LOW (ref 150–400)
RBC: 3.12 MIL/uL — ABNORMAL LOW (ref 3.87–5.11)
RBC: 3.12 MIL/uL — ABNORMAL LOW (ref 3.87–5.11)
RDW: 14.2 % (ref 11.5–15.5)
RDW: 14.3 % (ref 11.5–15.5)
WBC: 12.9 10*3/uL — ABNORMAL HIGH (ref 4.0–10.5)
WBC: 16 10*3/uL — ABNORMAL HIGH (ref 4.0–10.5)
nRBC: 0 % (ref 0.0–0.2)
nRBC: 0 % (ref 0.0–0.2)

## 2020-09-13 LAB — COMPREHENSIVE METABOLIC PANEL
ALT: 13 U/L (ref 0–44)
AST: 22 U/L (ref 15–41)
Albumin: 3.1 g/dL — ABNORMAL LOW (ref 3.5–5.0)
Alkaline Phosphatase: 49 U/L (ref 38–126)
Anion gap: 8 (ref 5–15)
BUN: 16 mg/dL (ref 8–23)
CO2: 25 mmol/L (ref 22–32)
Calcium: 8.6 mg/dL — ABNORMAL LOW (ref 8.9–10.3)
Chloride: 104 mmol/L (ref 98–111)
Creatinine, Ser: 0.75 mg/dL (ref 0.44–1.00)
GFR, Estimated: >60 mL/min (ref >60)
Glucose, Bld: 222 mg/dL — ABNORMAL HIGH (ref 70–99)
Potassium: 3.5 mmol/L (ref 3.5–5.1)
Sodium: 137 mmol/L (ref 135–145)
Total Bilirubin: 0.7 mg/dL (ref 0.3–1.2)
Total Protein: 6 g/dL — ABNORMAL LOW (ref 6.5–8.1)

## 2020-09-13 LAB — URINALYSIS, ROUTINE W REFLEX MICROSCOPIC

## 2020-09-13 LAB — TROPONIN I (HIGH SENSITIVITY)
Troponin I (High Sensitivity): 35 ng/L — ABNORMAL HIGH (ref <18)
Troponin I (High Sensitivity): 28 ng/L — ABNORMAL HIGH (ref <18)

## 2020-09-13 LAB — HIV ANTIBODY (ROUTINE TESTING W REFLEX): HIV Screen 4th Generation wRfx: NONREACTIVE

## 2020-09-13 LAB — MAGNESIUM: Magnesium: 1.7 mg/dL (ref 1.7–2.4)

## 2020-09-13 LAB — PHOSPHORUS: Phosphorus: 3.4 mg/dL (ref 2.5–4.6)

## 2020-09-13 LAB — RESP PANEL BY RT-PCR (FLU A&B, COVID) ARPGX2
Influenza A by PCR: NEGATIVE
Influenza B by PCR: NEGATIVE
SARS Coronavirus 2 by RT PCR: NEGATIVE

## 2020-09-13 LAB — URINALYSIS, MICROSCOPIC (REFLEX): RBC / HPF: >50 RBC/hpf (ref 0–5)

## 2020-09-13 LAB — BRAIN NATRIURETIC PEPTIDE: B Natriuretic Peptide: 977.6 pg/mL — ABNORMAL HIGH (ref 0.0–100.0)

## 2020-09-13 MED ORDER — DULOXETINE HCL 60 MG PO CPEP
60.0000 mg | ORAL_CAPSULE | Freq: Every day | ORAL | Status: DC
  Administered 2020-09-13 – 2020-09-14 (×2): 60 mg via ORAL
  Filled 2020-09-13 (×2): qty 1

## 2020-09-13 MED ORDER — THIAMINE HCL 100 MG PO TABS
100.0000 mg | ORAL_TABLET | Freq: Every day | ORAL | Status: DC
  Administered 2020-09-13 – 2020-09-14 (×2): 100 mg via ORAL
  Filled 2020-09-13 (×2): qty 1

## 2020-09-13 MED ORDER — APIXABAN 2.5 MG PO TABS
2.5000 mg | ORAL_TABLET | Freq: Two times a day (BID) | ORAL | Status: DC
  Administered 2020-09-13 – 2020-09-14 (×3): 2.5 mg via ORAL
  Filled 2020-09-13 (×3): qty 1

## 2020-09-13 MED ORDER — FOLIC ACID 1 MG PO TABS
1.0000 mg | ORAL_TABLET | Freq: Every day | ORAL | Status: DC
  Administered 2020-09-13 – 2020-09-14 (×2): 1 mg via ORAL
  Filled 2020-09-13 (×2): qty 1

## 2020-09-13 MED ORDER — PANTOPRAZOLE SODIUM 40 MG PO TBEC
40.0000 mg | DELAYED_RELEASE_TABLET | Freq: Every day | ORAL | Status: DC
  Administered 2020-09-13 – 2020-09-14 (×2): 40 mg via ORAL
  Filled 2020-09-13 (×2): qty 1

## 2020-09-13 MED ORDER — B COMPLEX-C PO TABS
1.0000 | ORAL_TABLET | Freq: Every day | ORAL | Status: DC
  Administered 2020-09-13 – 2020-09-14 (×2): 1 via ORAL
  Filled 2020-09-13 (×3): qty 1

## 2020-09-13 MED ORDER — ACETAMINOPHEN 325 MG PO TABS
650.0000 mg | ORAL_TABLET | Freq: Four times a day (QID) | ORAL | Status: DC | PRN
  Filled 2020-09-13: qty 2

## 2020-09-13 MED ORDER — SODIUM CHLORIDE 0.9 % IV BOLUS
500.0000 mL | Freq: Once | INTRAVENOUS | Status: AC
  Administered 2020-09-13: 500 mL via INTRAVENOUS

## 2020-09-13 MED ORDER — ALBUTEROL SULFATE (2.5 MG/3ML) 0.083% IN NEBU
5.0000 mg | INHALATION_SOLUTION | Freq: Once | RESPIRATORY_TRACT | Status: AC
  Administered 2020-09-13: 5 mg via RESPIRATORY_TRACT
  Filled 2020-09-13: qty 6

## 2020-09-13 MED ORDER — ALBUTEROL SULFATE (2.5 MG/3ML) 0.083% IN NEBU: 2.5000 mg | INHALATION_SOLUTION | RESPIRATORY_TRACT | Status: DC | PRN

## 2020-09-13 MED ORDER — PREGABALIN 25 MG PO CAPS
50.0000 mg | ORAL_CAPSULE | Freq: Three times a day (TID) | ORAL | Status: DC
  Administered 2020-09-13 – 2020-09-14 (×4): 50 mg via ORAL
  Filled 2020-09-13 (×4): qty 2

## 2020-09-13 MED ORDER — NICOTINE 21 MG/24HR TD PT24
21.0000 mg | MEDICATED_PATCH | Freq: Every day | TRANSDERMAL | Status: DC
  Administered 2020-09-13 – 2020-09-14 (×2): 21 mg via TRANSDERMAL
  Filled 2020-09-13 (×3): qty 1

## 2020-09-13 MED ORDER — TRAMADOL HCL 50 MG PO TABS
50.0000 mg | ORAL_TABLET | Freq: Four times a day (QID) | ORAL | Status: DC | PRN
  Administered 2020-09-13: 50 mg via ORAL
  Filled 2020-09-13: qty 1

## 2020-09-13 MED ORDER — VITAMIN D3 50 MCG (2000 UT) PO TABS: 1.0000 | ORAL_TABLET | Freq: Every day | ORAL | Status: DC

## 2020-09-13 MED ORDER — B COMPLEX PO TABS: 1.0000 | ORAL_TABLET | Freq: Every day | ORAL | Status: DC

## 2020-09-13 MED ORDER — VITAMIN D 25 MCG (1000 UNIT) PO TABS
1000.0000 [IU] | ORAL_TABLET | Freq: Every day | ORAL | Status: DC
  Administered 2020-09-13 – 2020-09-14 (×2): 1000 [IU] via ORAL
  Filled 2020-09-13 (×2): qty 1

## 2020-09-13 MED ORDER — PREDNISONE 20 MG PO TABS
40.0000 mg | ORAL_TABLET | Freq: Every day | ORAL | Status: DC
  Administered 2020-09-14: 40 mg via ORAL
  Filled 2020-09-13: qty 2

## 2020-09-13 MED ORDER — METHYLPREDNISOLONE SODIUM SUCC 125 MG IJ SOLR
125.0000 mg | Freq: Once | INTRAMUSCULAR | Status: AC
  Administered 2020-09-13: 125 mg via INTRAVENOUS
  Filled 2020-09-13: qty 2

## 2020-09-13 MED ORDER — ATORVASTATIN CALCIUM 10 MG PO TABS
20.0000 mg | ORAL_TABLET | Freq: Every day | ORAL | Status: DC
  Administered 2020-09-13: 20 mg via ORAL
  Filled 2020-09-13: qty 2

## 2020-09-13 MED ORDER — FUROSEMIDE 10 MG/ML IJ SOLN
40.0000 mg | INTRAMUSCULAR | Status: AC
  Administered 2020-09-13: 40 mg via INTRAVENOUS
  Filled 2020-09-13: qty 4

## 2020-09-13 MED ORDER — TAB-A-VITE/IRON PO TABS
1.0000 | ORAL_TABLET | Freq: Every day | ORAL | Status: DC
  Administered 2020-09-13: 1 via ORAL
  Filled 2020-09-13 (×2): qty 1

## 2020-09-13 MED ORDER — METHYLPREDNISOLONE SODIUM SUCC 125 MG IJ SOLR
80.0000 mg | Freq: Once | INTRAMUSCULAR | Status: AC
  Administered 2020-09-13: 80 mg via INTRAVENOUS
  Filled 2020-09-13: qty 2

## 2020-09-13 MED ORDER — IPRATROPIUM-ALBUTEROL 0.5-2.5 (3) MG/3ML IN SOLN
3.0000 mL | Freq: Four times a day (QID) | RESPIRATORY_TRACT | Status: DC
  Administered 2020-09-13 – 2020-09-14 (×5): 3 mL via RESPIRATORY_TRACT
  Filled 2020-09-13 (×5): qty 3

## 2020-09-13 MED ORDER — VITAMIN B-1 100 MG PO TABS
100.0000 mg | ORAL_TABLET | Freq: Every day | ORAL | Status: DC
  Filled 2020-09-13: qty 1

## 2020-09-13 MED ORDER — FLUTICASONE FUROATE-VILANTEROL 100-25 MCG/INH IN AEPB
1.0000 | INHALATION_SPRAY | Freq: Every day | RESPIRATORY_TRACT | Status: DC
  Administered 2020-09-14: 1 via RESPIRATORY_TRACT
  Filled 2020-09-13: qty 28

## 2020-09-13 NOTE — ED Triage Notes (Addendum)
Per EMS, moved in w/ her daughter in March after a hospital stay for CHF.  Saturday morning she began very SOB that got progressively worse.  She was found to be 70% O2 upon EMS arrival, placed on a non-rebreather and eventually weaned down to 4L [place], no home O2.  Pt also reports hematuria for the past 24 hours.  80HR 24RR 136/70 22G L hand

## 2020-09-13 NOTE — ED Notes (Signed)
Attempted to call report

## 2020-09-13 NOTE — Evaluation (Signed)
Physical Therapy Evaluation Patient Details Name: Kelly [name] MRN: [id number] DOB: 08/08/1940 Today's Date: 09/13/2020   History of Present Illness  80 y.o. female presents to [facility] ED on 09/12/2020 with dyspnea and hypoxic respiratory failure. PMH includes tobacco use, atrial fibrillation, COPD, diastolic CHF, hypertension, dyslipidemia.  Clinical Impression  Pt presents to PT with deficits in activity tolerance and cardiopulmonary function. Pt mobilizes well, not requiring physical assistance at this time. Pt reports DOE near completion of ambulation and reports a goal of ambulating for 10 minutes consecutively to aide in returning to community level mobility. Pt will benefit from continued acute PT services to improve activity tolerance and provide energy conservation education.    Follow Up Recommendations No PT follow up    Equipment Recommendations  None recommended by PT    Recommendations for Other Services       Precautions / Restrictions Precautions Precautions: Fall Precaution Comments: monitor SpO2 Restrictions Weight Bearing Restrictions: No      Mobility  Bed Mobility Overal bed mobility: Independent                  Transfers Overall transfer level: Independent Equipment used: None                Ambulation/Gait Ambulation/Gait assistance: Supervision Gait Distance (Feet): 200 Feet Assistive device: None Gait Pattern/deviations: Step-through pattern Gait velocity: functional Gait velocity interpretation: >2.62 ft/sec, indicative of community ambulatory General Gait Details: pt with steady step-through gait, no balance deviations noted  Stairs            Wheelchair Mobility    Modified Rankin (Stroke Patients Only)       Balance Overall balance assessment: Needs assistance Sitting-balance support: No upper extremity supported;Feet supported Sitting balance-Leahy Scale: Good     Standing balance support: No upper extremity  supported;During functional activity Standing balance-Leahy Scale: Good                               Pertinent Vitals/Pain Pain Assessment: 0-10 Pain Score: 4  Pain Location: LUQ (chronic in nature) Pain Descriptors / Indicators: Aching Pain Intervention(s): Monitored during session    Home Living Family/patient expects to be discharged to:: Private residence Living Arrangements: Children;Other relatives Available Help at Discharge: Family;Available 24 hours/day Type of Home: House Home Access: Stairs to enter Entrance Stairs-Rails: None Entrance Stairs-Number of Steps: 5 Home Layout: Multi-level Home Equipment: None      Prior Function Level of Independence: Independent         Comments: pt reports ascending steps to shower once every few days. Pt ambulates for household and limited community distances.     Hand Dominance        Extremity/Trunk Assessment   Upper Extremity Assessment Upper Extremity Assessment: Overall WFL for tasks assessed    Lower Extremity Assessment Lower Extremity Assessment: Overall WFL for tasks assessed    Cervical / Trunk Assessment Cervical / Trunk Assessment: Normal  Communication   Communication: No difficulties  Cognition Arousal/Alertness: Awake/alert Behavior During Therapy: WFL for tasks assessed/performed Overall Cognitive Status: Within Functional Limits for tasks assessed                                        General Comments General comments (skin integrity, edema, etc.): pt on 2L [place] upon arrival, weaned to room air. Pt  ambulates on room air with sats from 91-95%. Pt does report fatigue near completion of walk and some DOE during. Pt left on RA, RN made aware    Exercises     Assessment/Plan    PT Assessment Patient needs continued PT services  PT Problem List Cardiopulmonary status limiting activity;Decreased activity tolerance       PT Treatment Interventions DME  instruction;Stair training;Gait training;Therapeutic activities;Therapeutic exercise;Patient/family education    PT Goals (Current goals can be found in the Care Plan section)  Acute Rehab PT Goals Patient Stated Goal: to return home PT Goal Formulation: With patient Time For Goal Achievement: 09/27/20 Potential to Achieve Goals: Good Additional Goals Additional Goal #1: Pt will report 3/10 DOE or less when ambulating for >150' to indicate improvement in activity tolerance.    Frequency Min 3X/week   Barriers to discharge        Co-evaluation               AM-PAC PT "6 Clicks" Mobility  Outcome Measure Help needed turning from your back to your side while in a flat bed without using bedrails?: None Help needed moving from lying on your back to sitting on the side of a flat bed without using bedrails?: None Help needed moving to and from a bed to a chair (including a wheelchair)?: None Help needed standing up from a chair using your arms (e.g., wheelchair or bedside chair)?: None Help needed to walk in hospital room?: A Little Help needed climbing 3-5 steps with a railing? : A Little 6 Click Score: 22    End of Session   Activity Tolerance: Patient limited by fatigue Patient left: in bed;with call bell/phone within reach Nurse Communication: Mobility status PT Visit Diagnosis: Other abnormalities of gait and mobility (R26.89)    Time: [id number] PT Time Calculation (min) (ACUTE ONLY): 18 min   Charges:   PT Evaluation $PT Eval Low Complexity: [place], PT, DPT Acute Rehabilitation Pager: [phone]   Zenaida Niece 09/13/2020, 11:40 AM

## 2020-09-13 NOTE — H&P (Signed)
History and Physical  Patient Name: Kelly [name]     [id number]    DOB: 07/27/1940    DOA: 09/13/2020 PCP: Joya Gaskins, FNP  Patient coming from: Home  Chief Complaint: Dyspnea    HPI: Kelly [name] is a 80 y.o. female, with PMH of tobacco use, atrial fibrillation, COPD, diastolic CHF, hypertension, dyslipidemia, who presented to the ER on 09/12/2020 with dyspnea and hypoxic respiratory failure.  Patient states over the past 24 hours she has had worsening dyspnea.  Her symptoms have been progressive.  Worse with exertion.  Symptoms started after smoking a cigarette in the morning with her coffee.  Nothing seems to help her symptoms.  She tried her inhalers.  She says she has been compliant with her home medication regimen.  She continues to vape and smoke cigarettes but says she is trying to quit.  She has had similar presentations in the past hospitalization.  She was recently admitted at an outside hospital in the beginning of the month for CHF and/or COPD exacerbation.  She was discharged on antibiotics and prednisone.  EMS was called and it was noted she was satting 70% on room air.  She improved with nonrebreather and was brought to the ER.    ED course: -Vitals on admission: Afebrile, heart rate 78, respiratory rate 28, maintaining sats but on nasal cannula -Labs on initial presentation: Sodium 137, potassium 3.7, chloride 103, bicarb 27, glucose 146, BUN 15, creatinine 0.78, WBC 12.9, hemoglobin 9.9, BNP 977 -Imaging obtained on admission: Chest x-ray on admission demonstrated chronic appearing interstitial disease -In the ED the patient was given Solu-Medrol, Lasix, albuterol, and the hospitalist service was contacted for further evaluation and management.     ROS: A complete and thorough 12 point review of systems obtained, negative listed in HPI.     Past Medical History:  Diagnosis Date   Anemia    Anxiety    B12 deficiency 05/2015   B12 was 184   COPD  (chronic obstructive pulmonary disease) (HCC)    Crohn's disease, small intestine ([place]) 02/19/2009   Depression 10/2013   chronic recurrent major depressive disorder   H/O vitamin D deficiency 08/04/2008   Recurrent dislocation of hip, right     Past Surgical History:  Procedure Laterality Date   JOINT REPLACEMENT     Right hip, 2015   LAPAROSCOPY  06/03/2016   Duodenal ulcer repair    Social History: Patient lives with family.  The patient walks without assistance.  Current smoker.  Allergies  Allergen Reactions   Bactrim [Sulfamethoxazole-Trimethoprim]    Bee Venom     Family history: family history includes Alcohol abuse in her mother; Breast cancer in her sister; Colon cancer in her sister; Heart attack in her brother; Heart disease in her mother.  Prior to Admission medications   Medication Sig Start Date End Date Taking? Authorizing Provider  acetaminophen-codeine (TYLENOL #3) 300-30 MG tablet Take 1 tablet by mouth every 6 (six) hours as needed. 07/22/20   [provider]  albuterol (VENTOLIN HFA) 108 (90 Base) MCG/ACT inhaler Inhale 1 puff into the lungs every 6 (six) hours as needed for wheezing or shortness of breath.    [provider]  apixaban (ELIQUIS) 2.5 MG TABS tablet Take 2.5 mg by mouth 2 (two) times daily.    [provider]  atorvastatin (LIPITOR) 20 MG tablet Take 20 mg by mouth daily.    [provider]  b complex vitamins tablet Take 1 tablet  by mouth daily.    [provider]  calcium carbonate (TUMS - DOSED IN MG ELEMENTAL CALCIUM) 500 MG chewable tablet Chew 1 tablet by mouth daily.    [provider]  Cholecalciferol (VITAMIN D3) 2000 units TABS Take 1 tablet by mouth daily.    [provider]  diclofenac sodium (VOLTAREN) 1 % GEL Apply 2 g topically 4 (four) times daily. 09/01/16   Jamse Arn, MD  diltiazem (CARDIZEM CD) 180 MG 24 hr capsule Take 1 capsule (180 mg total) by mouth daily.  08/13/20 11/11/20  Adrian Prows, MD  DULoxetine (CYMBALTA) 30 MG capsule Take 30 mg by mouth daily.    [provider]  ferrous sulfate 325 (65 FE) MG EC tablet Take 1 tablet (325 mg total) by mouth daily with breakfast. 08/02/16   [place], Betty G, MD  Fluticasone Furoate (ARNUITY ELLIPTA) 200 MCG/ACT AEPB Inhale 1 puff into the lungs daily. 08/16/16   [place], Betty G, MD  folic acid (FOLVITE) 1 MG tablet Take 1 mg by mouth daily.    [provider]  metoprolol tartrate (LOPRESSOR) 25 MG tablet Take 25 mg by mouth 2 (two) times daily.    [provider]  Multiple Vitamins-Iron (MULTIVITAMINS WITH IRON) TABS tablet Take 1 tablet by mouth daily.    [provider]  nicotine (NICODERM CQ - DOSED IN MG/24 HOURS) 21 mg/24hr patch Place 1 patch (21 mg total) onto the skin daily. 06/04/20   Adrian Prows, MD  pantoprazole (PROTONIX) 40 MG tablet Take 40 mg by mouth daily.    [provider]  pregabalin (LYRICA) 50 MG capsule Take 50 mg by mouth 3 (three) times daily.    [provider]  Spacer/Aero-Holding Chambers (VORTEX VALVED HOLDING CHAMBER) DEVI by Does not apply route.    [provider]  thiamine (VITAMIN B-1) 100 MG tablet Take 100 mg by mouth daily.    [provider]       Physical Exam: BP (!) 115/48   Pulse 80   Temp 97.9 F (36.6 C) (Oral)   Resp 20   Ht '5\' 2"'$  (1.575 m)   Wt 43.5 kg   SpO2 96%   BMI 17.56 kg/m   General appearance: Slightly frail appearing female, alert and slight respiratory distress Eyes: Anicteric, conjunctiva pink, lids and lashes normal. PERRL.    ENT: No nasal deformity, discharge, epistaxis.  Hearing intact. OP moist without lesions.   Neck: No neck masses.  Trachea midline.  No thyromegaly/tenderness. Lymph: No cervical or supraclavicular lymphadenopathy. Skin: Warm and dry.  No jaundice.  No suspicious rashes or lesions. Cardiac: RRR, nl [id number], systolic murmur noted.  No LE edema.   Radial and pedal pulses 2+ and symmetric. Respiratory: Moderate respiratory distress with talking, coarse breath sounds bilaterally, slight wheezing Abdomen: Abdomen soft.  No tenderness with palpation. No ascites, distension, hepatosplenomegaly.   MSK: No deformities or effusions of the large joints of the upper or lower extremities bilaterally.  No cyanosis or clubbing. Neuro: Cranial nerves 2 through 12 grossly intact.  Sensation intact to light touch. Speech is fluent.  Marland Kitchen    Psych: Sensorium intact and responding to questions, attention normal.  Behavior appropriate.  Judgment and insight appear normal.    Labs on Admission:  I have personally reviewed following labs and imaging studies: CBC: Recent Labs  Lab 09/13/20 0135  WBC 12.9*  NEUTROABS 10.4*  HGB 9.9*  HCT 30.7*  MCV 98.4  PLT 141*  Basic Metabolic Panel: Recent Labs  Lab 09/13/20 0135  NA 137  K 3.7  CL 103  CO2 27  GLUCOSE 146*  BUN 15  CREATININE 0.78  CALCIUM 8.7*   GFR: Estimated Creatinine Clearance: 38.5 mL/min (by C-G formula based on SCr of 0.78 mg/dL).  Liver Function Tests: No results for input(s): AST, ALT, ALKPHOS, BILITOT, PROT, ALBUMIN in the last 168 hours. No results for input(s): LIPASE, AMYLASE in the last 168 hours. No results for input(s): AMMONIA in the last 168 hours. Coagulation Profile: No results for input(s): INR, PROTIME in the last 168 hours. Cardiac Enzymes: No results for input(s): CKTOTAL, CKMB, CKMBINDEX, TROPONINI in the last 168 hours. BNP (last 3 results) No results for input(s): PROBNP in the last 8760 hours. HbA1C: No results for input(s): HGBA1C in the last 72 hours. CBG: No results for input(s): GLUCAP in the last 168 hours. Lipid Profile: No results for input(s): CHOL, HDL, LDLCALC, TRIG, CHOLHDL, LDLDIRECT in the last 72 hours. Thyroid Function Tests: No results for input(s): TSH, T4TOTAL, FREET4, T3FREE, THYROIDAB in the last 72 hours. Anemia Panel: No  results for input(s): VITAMINB12, FOLATE, FERRITIN, TIBC, IRON, RETICCTPCT in the last 72 hours.   No results found for this or any previous visit (from the past 240 hour(s)).         Radiological Exams on Admission: Personally reviewed imaging which shows: Chest x-ray on admission demonstrated chronic appearing interstitial disease DG Chest Port 1 View  Result Date: 09/13/2020 CLINICAL DATA:  Worsening shortness of breath. EXAM: PORTABLE CHEST 1 VIEW COMPARISON:  September 16, 2016 FINDINGS: Diffuse, chronic appearing increased interstitial lung markings are seen. There is no evidence of acute infiltrate, pleural effusion or pneumothorax. The cardiac silhouette is borderline in size. Marked severity calcification of the aortic arch is seen. The visualized skeletal structures are unremarkable. IMPRESSION: Chronic appearing increased interstitial lung markings without acute cardiopulmonary disease. Electronically Signed   By: Virgina Norfolk M.D.   On: 09/13/2020 01:24          Assessment/Plan   1.  Acute hypoxic respiratory failure -Suspect primarily related to COPD exacerbation, less likely CHF exacerbation - Patient satting 70% on room air per EMS - Chest x-ray on admission demonstrated chronic interstitial disease - Continue scheduled Solu-Medrol - Scheduled and as needed breathing treatments - Wean O2 as tolerated  2.  COPD exacerbation -See plans above  3.  Tobacco abuse -Recommend cessation, counseling ordered - Nicotine patch prescribed while inpatient  4.  Paroxysmal A. fib -Continue home regiment once meds reconciled  5.  Dyslipidemia -Continue home statin once meds reconciled  6.  Essential hypertension -Resume home medications once reconciled  7.  Polyneuropathy -Resume home meds once reconciled     DVT prophylaxis: Continue home Eliquis Code Status: Full Family Communication: None  Disposition Plan: Anticipate discharge home when medically  optimized Consults called: None Admission status: Observation   At the point of initial evaluation, it is my clinical opinion that admission for OBSERVATION is reasonable and necessary because the patient's presenting complaints in the context of their chronic conditions represent sufficient risk of deterioration or significant morbidity to constitute reasonable grounds for close observation in the hospital setting, but that the patient may be medically stable for discharge from the hospital within 24 to 48 hours.    Medical decision making: Patient seen at 3:35 AM on 09/13/2020.  The patient was discussed with ER provider.  What exists of the patient's chart was reviewed in  depth and summarized above.  Clinical condition: Fair.        Doran Heater [facility] Please page though [place] or Epic secure chat:  For password, contact charge nurse

## 2020-09-13 NOTE — ED Provider Notes (Signed)
[facility] EMERGENCY DEPARTMENT Provider Note   CSN: [id number] Arrival date & time: 09/13/20  0037     History Chief Complaint  Patient presents with  . Shortness of Breath    Kelly [name] is a 80 y.o. female.  The history is provided by the patient and medical records.  Shortness of Breath  80 year old female with with history of anemia, anxiety, COPD, CHF, presenting to the ED with shortness of breath.  Patient recently admitted at [facility] earlier in the month for mixed COPD/CHF.  Today began feeling increasingly more short of breath as the day progressed, states it was scaring her.  Upon EMS arrival initial sats were 70% on room air.  She is not generally oxygen dependent.  She was started on nonrebreather and eventually tapered down to 4 L and maintaining proper saturations.  She does report some wheezing.  She does continue to smoke, about 6 cigarettes a day down from 1 pack/day.  She does have inhalers that she uses but has not had much change with those recently.  She denies any lower extremity edema, weight gain, nighttime orthopnea.  Had 2D echo 08/27/20 at [facility], EF 60-65%.  She has been taking '20mg'$  lasix daily.  Patient also reports new hematuria that began today.  She has not had any dysuria or urinary frequency.  No fever or chills.  No abdominal or flank pain.  She is anticoagulated on Eliquis.  Past Medical History:  Diagnosis Date  . Anemia   . Anxiety   . B12 deficiency 05/2015   B12 was 184  . COPD (chronic obstructive pulmonary disease) ([place])   . Crohn's disease, small intestine ([place]) 02/19/2009  . Depression 10/2013   chronic recurrent major depressive disorder  . H/O vitamin D deficiency 08/04/2008  . Recurrent dislocation of hip, right     Patient Active Problem List   Diagnosis Date Noted  . 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 Asymptomatic bilateral carotid artery stenosis 08/13/2020  . Paroxysmal atrial fibrillation  ([place]) 08/13/2020  . Severe concentric left ventricular hypertrophy 09/26/2016  . Hypothyroidism, unspecified 09/26/2016  . B12 deficiency 08/15/2016  . Anemia, iron deficiency 08/15/2016  . Bilateral lower extremity edema 08/02/2016  . Chronic pain disorder 07/17/2016  . 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 Tobacco use disorder 07/13/2016  . Anxiety disorder, unspecified 07/13/2016  . PUD (peptic ulcer disease) 07/13/2016  . COPD (chronic obstructive pulmonary disease) ([place]) 07/13/2016    Past Surgical History:  Procedure Laterality Date  . JOINT REPLACEMENT     Right hip, 2015  . LAPAROSCOPY  06/03/2016   Duodenal ulcer repair     OB History   No obstetric history on file.     Family History  Problem Relation Age of Onset  . Heart disease Mother   . Alcohol abuse Mother   . Colon cancer Sister   . Breast cancer Sister   . Heart attack Brother   . Cancer Neg Hx   . Diabetes Neg Hx     Social History   Tobacco Use  . Smoking status: Every Day    Types: Cigarettes  . Smokeless tobacco: Never  . Tobacco comments:    trying to quit, wearing nicotine patch. Cautioned against smoking and wearing patch. Smokes when patch is taken off.  Vaping Use  . Vaping Use: Never used  Substance Use Topics  . Alcohol use: Yes    Comment: OCC  . Drug use: No  Home Medications Prior to Admission medications   Medication Sig Start Date End Date Taking? Authorizing Provider  acetaminophen-codeine (TYLENOL #3) 300-30 MG tablet Take 1 tablet by mouth every 6 (six) hours as needed. 07/22/20   [provider]  albuterol (VENTOLIN HFA) 108 (90 Base) MCG/ACT inhaler Inhale 1 puff into the lungs every 6 (six) hours as needed for wheezing or shortness of breath.    [provider]  apixaban (ELIQUIS) 2.5 MG TABS tablet Take 2.5 mg by mouth 2 (two) times daily.    [provider]  atorvastatin (LIPITOR) 20 MG tablet Take 20 mg by mouth daily.    [provider]  b complex vitamins tablet Take 1 tablet by mouth daily.    [provider]  calcium carbonate (TUMS - DOSED IN MG ELEMENTAL CALCIUM) 500 MG chewable tablet Chew 1 tablet by mouth daily.    [provider]  Cholecalciferol (VITAMIN D3) 2000 units TABS Take 1 tablet by mouth daily.    [provider]  diclofenac sodium (VOLTAREN) 1 % GEL Apply 2 g topically 4 (four) times daily. 09/01/16   Jamse Arn, MD  diltiazem (CARDIZEM CD) 180 MG 24 hr capsule Take 1 capsule (180 mg total) by mouth daily. 08/13/20 11/11/20  Adrian Prows, MD  DULoxetine (CYMBALTA) 30 MG capsule Take 30 mg by mouth daily.    [provider]  ferrous sulfate 325 (65 FE) MG EC tablet Take 1 tablet (325 mg total) by mouth daily with breakfast. 08/02/16   [place], Betty G, MD  Fluticasone Furoate (ARNUITY ELLIPTA) 200 MCG/ACT AEPB Inhale 1 puff into the lungs daily. 08/16/16   [place], Betty G, MD  folic acid (FOLVITE) 1 MG tablet Take 1 mg by mouth daily.    [provider]  metoprolol tartrate (LOPRESSOR) 25 MG tablet Take 25 mg by mouth 2 (two) times daily.    [provider]  Multiple Vitamins-Iron (MULTIVITAMINS WITH IRON) TABS tablet Take 1 tablet by mouth daily.    [provider]  nicotine (NICODERM CQ - DOSED IN MG/24 HOURS) 21 mg/24hr patch Place 1 patch (21 mg total) onto the skin daily. 06/04/20   Adrian Prows, MD  pantoprazole (PROTONIX) 40 MG tablet Take 40 mg by mouth daily.    [provider]  pregabalin (LYRICA) 50 MG capsule Take 50 mg by mouth 3 (three) times daily.    [provider]  Spacer/Aero-Holding Chambers (VORTEX VALVED HOLDING CHAMBER) DEVI by Does not apply route.    [provider]  thiamine (VITAMIN B-1) 100 MG tablet Take 100 mg by mouth daily.    [provider]    Allergies    Bactrim [sulfamethoxazole-trimethoprim] and Bee venom  Review of Systems   Review of Systems  Respiratory:   Positive for shortness of breath.   All other systems reviewed and are negative.  Physical Exam Updated Vital Signs BP (!) 115/48   Pulse 80   Temp 97.9 F (36.6 C) (Oral)   Resp 20   Ht '5\' 2"'$  (1.575 m)   Wt 43.5 kg   SpO2 96%   BMI 17.56 kg/m   Physical Exam Vitals and nursing note reviewed.  Constitutional:      Appearance: She is well-developed.  HENT:     Head: Normocephalic and atraumatic.  Eyes:     Conjunctiva/sclera: Conjunctivae normal.     Pupils: Pupils are equal, round, and reactive to light.  Cardiovascular:     Rate  and Rhythm: Normal rate and regular rhythm.     Heart sounds: Normal heart sounds.  Pulmonary:     Effort: Pulmonary effort is normal.     Breath sounds: Wheezing present.     Comments: Wheezes noted, does seem to be labored with conversation, 4L O2 in use Abdominal:     General: Bowel sounds are normal.     Palpations: Abdomen is soft.  Musculoskeletal:        General: Normal range of motion.     Cervical back: Normal range of motion.  Skin:    General: Skin is warm and dry.  Neurological:     Mental Status: She is alert and oriented to person, place, and time.    ED Results / Procedures / Treatments   Labs (all labs ordered are listed, but only abnormal results are displayed) Labs Reviewed  CBC WITH DIFFERENTIAL/PLATELET - Abnormal; Notable for the following components:      Result Value   WBC 12.9 (*)    RBC 3.12 (*)    Hemoglobin 9.9 (*)    HCT 30.7 (*)    Platelets 141 (*)    Neutro Abs 10.4 (*)    Abs Immature Granulocytes 0.08 (*)    All other components within normal limits  BASIC METABOLIC PANEL - Abnormal; Notable for the following components:   Glucose, Bld 146 (*)    Calcium 8.7 (*)    All other components within normal limits  BRAIN NATRIURETIC PEPTIDE - Abnormal; Notable for the following components:   B Natriuretic Peptide 977.6 (*)    All other components within normal limits  TROPONIN I (HIGH SENSITIVITY) -  Abnormal; Notable for the following components:   Troponin I (High Sensitivity) 35 (*)    All other components within normal limits  RESP PANEL BY RT-PCR (FLU A&B, COVID) ARPGX2  URINE CULTURE  URINALYSIS, ROUTINE W REFLEX MICROSCOPIC  TROPONIN I (HIGH SENSITIVITY)    EKG EKG Interpretation  Date/Time:  Sunday September 13 2020 01:01:52 EDT Ventricular Rate:  77 PR Interval:  201 QRS Duration: 85 QT Interval:  415 QTC Calculation: 470 R Axis:   77 Text Interpretation: Sinus rhythm Atrial premature complexes Anterior infarct, old Confirmed by Merrily Pew [phone]) on 09/13/2020 1:14:20 AM  Radiology DG Chest Port 1 View  Result Date: 09/13/2020 CLINICAL DATA:  Worsening shortness of breath. EXAM: PORTABLE CHEST 1 VIEW COMPARISON:  September 16, 2016 FINDINGS: Diffuse, chronic appearing increased interstitial lung markings are seen. There is no evidence of acute infiltrate, pleural effusion or pneumothorax. The cardiac silhouette is borderline in size. Marked severity calcification of the aortic arch is seen. The visualized skeletal structures are unremarkable. IMPRESSION: Chronic appearing increased interstitial lung markings without acute cardiopulmonary disease. Electronically Signed   By: Virgina Norfolk M.D.   On: 09/13/2020 01:24    Procedures Procedures   Medications Ordered in ED Medications  furosemide (LASIX) injection 40 mg (has no administration in time range)  methylPREDNISolone sodium succinate (SOLU-MEDROL) 125 mg/2 mL injection 125 mg (125 mg Intravenous Given 09/13/20 0148)  albuterol (PROVENTIL) (2.5 MG/3ML) 0.083% nebulizer solution 5 mg (5 mg Nebulization Given 09/13/20 0146)    ED Course  I have reviewed the triage vital signs and the nursing notes.  Pertinent labs & imaging results that were available during my care of the patient were reviewed by me and considered in my medical decision making (see chart for details).    MDM Rules/Calculators/A&P  80 y.o. female here with shortness of breath.  Recently admitted to [facility] earlier in the month for mixed CHF/COPD exacerbation.  Initial O2 sats upon EMS arrival around 70% on room air.  She is not generally oxygen dependent.  She has been weaned down to 4 L from nonrebreather.  She does have some intermixed wheezes on exam.  She does report she continues to smoke.  Suspect COPD plus minus CHF.  Work-up is pending.  She is given Solu-Medrol and albuterol treatment.  3:11 AM Patient reports feeling better after treatment.  She does sound improved, speaking easier at present.  Able to wean down to 2L supplemental O2.  Work-up is consistent with combined CHF/COPD exacerbation.  BNP is slightly worse than last hospitalization (600's --> 977).  Trop 35, suspect some demand ischemia.  Given dose of IV lasix.  Will admit for ongoing care.  Discussed with hospitalist, Dr. Luna Fuse-- will admit for ongoing care.  Final Clinical Impression(s) / ED Diagnoses Final diagnoses:  Acute on chronic congestive heart failure, unspecified heart failure type [facility])  COPD exacerbation [facility])    Rx / DC Orders ED Discharge Orders     None        Larene Pickett, PA-C 09/13/20 0328    Merrily Pew, MD 09/13/20 [phone]

## 2020-09-13 NOTE — Progress Notes (Signed)
   Kelly [name]  [id number] DOB: Sep 19, 1940 DOA: 09/13/2020 PCP: Joya Gaskins, FNP    Brief Narrative:  80 year old with a history of tobacco abuse, chronic atrial fibrillation, COPD, diastolic CHF, HTN, and HLD who presented to the ER with dyspnea and hypoxia gradually progressive over 24 hours.  EMS was summoned to the house and she was found to have an oxygen saturation of 70%.  Significant Events:  8/28 admit via ER  Consultants:  None  Code Status: FULL CODE  Antimicrobials:  None  DVT prophylaxis: Eliquis  Subjective: Patient seen for follow-up exam.  She appears to be improving nicely.  Assessment & Plan:  Acute hypoxic respiratory failure -acute bronchospastic COPD exacerbation Continue steroids and bronchodilators -continue oxygen support  Ongoing tobacco abuse Has been counseled again on absolute need to discontinue tobacco abuse  Chronic paroxysmal atrial fibrillation Continue usual home medication regimen to include Eliquis -rate presently controlled  HLD Continue home statin dose  Essential HTN -transient asymptomatic hypotension Hold usual home BP medications as patient has experienced transient asymptomatic hypotension -gently hydrate and follow  Chronic polyneuropathy Continue usual home medications  Hematuria  Newly noted this admit -reports intermittent episodes at home -follow clinically for now and if persists consider further work-up    Family Communication:  Status is: Observation  The patient remains OBS appropriate and will d/c before 2 midnights.  Dispo: The patient is from: Home              Anticipated d/c is to: Home              Patient currently is not medically stable to d/c.   Difficult to place patient No         Objective: Blood pressure (!) 83/48, pulse 85, temperature 97.9 F (36.6 C), temperature source Oral, resp. rate 19, height '5\' 2"'$  (1.575 m), weight 43.5 kg, SpO2 99 %. No intake or output data in  the 24 hours ending 09/13/20 1010 Filed Weights   09/13/20 0044  Weight: 43.5 kg    Examination: Patient interviewed and examined by one of my partners earlier today.  CBC: Recent Labs  Lab 09/13/20 0135 09/13/20 0425  WBC 12.9* 16.0*  NEUTROABS 10.4* 15.0*  HGB 9.9* 10.1*  HCT 30.7* 30.7*  MCV 98.4 98.4  PLT 141* [id number]*   Basic Metabolic Panel: Recent Labs  Lab 09/13/20 0135 09/13/20 0425  NA 137 137  K 3.7 3.5  CL 103 104  CO2 27 25  GLUCOSE 146* 222*  BUN 15 16  CREATININE 0.78 0.75  CALCIUM 8.7* 8.6*  MG  --  1.7  PHOS  --  3.4   GFR: Estimated Creatinine Clearance: 38.5 mL/min (by C-G formula based on SCr of 0.75 mg/dL).  Liver Function Tests: Recent Labs  Lab 09/13/20 0425  AST 22  ALT 13  ALKPHOS 49  BILITOT 0.7  PROT 6.0*  ALBUMIN 3.1*     Scheduled Meds:  fluticasone furoate-vilanterol  1 puff Inhalation Daily   ipratropium-albuterol  3 mL Nebulization Q6H   methylPREDNISolone (SOLU-MEDROL) injection  80 mg Intravenous Once   Followed by   Derrill Memo ON 09/14/2020] predniSONE  40 mg Oral Q breakfast   nicotine  21 mg Transdermal Daily      LOS: 0 days   Cherene Altes, MD [facility] Office  [phone] Pager - Text Page per Shea Evans  If 7PM-7AM, please contact night-coverage per Amion 09/13/2020, 10:10 AM

## 2020-09-14 DIAGNOSIS — J441 Chronic obstructive pulmonary disease with (acute) exacerbation: Secondary | ICD-10-CM | POA: Diagnosis not present

## 2020-09-14 DIAGNOSIS — J9601 Acute respiratory failure with hypoxia: Secondary | ICD-10-CM | POA: Diagnosis not present

## 2020-09-14 LAB — URINE CULTURE: Culture: <10000 — AB

## 2020-09-14 MED ORDER — IPRATROPIUM-ALBUTEROL 0.5-2.5 (3) MG/3ML IN SOLN
3.0000 mL | Freq: Two times a day (BID) | RESPIRATORY_TRACT | Status: DC
  Administered 2020-09-14: 3 mL via RESPIRATORY_TRACT

## 2020-09-14 MED ORDER — PREDNISONE 20 MG PO TABS: 40.0000 mg | ORAL_TABLET | Freq: Every day | ORAL | 0 refills | Status: AC

## 2020-09-14 NOTE — Progress Notes (Signed)
Discharge instructions given. Patient verbalized understanding and all questions were answered.  ?

## 2020-09-14 NOTE — Discharge Summary (Signed)
Physician Discharge Summary  Kelly [name] [id number] DOB: 1940-06-24  PCP: Joya Gaskins, FNP  Admitted from: Home Discharged to: Home  Admit date: 09/13/2020 Discharge date: 09/14/2020  Recommendations for Outpatient Follow-up:    Follow-up Information     Joya Gaskins, FNP. Go on 09/23/2020.   Specialty: Family Medicine Why: '@1'$ :00pm.  To be seen with repeat labs (CBC & BMP). Contact information: [address] [place] [address] [phone]                  Home Health: None    Equipment/Devices: None    Discharge Condition: Improved and stable   Code Status: Full Diet recommendation:  Discharge Diet Orders (From admission, onward)     Start     Ordered   09/14/20 0000  Diet - low sodium heart healthy        09/14/20 1202             Discharge Diagnoses:  Active Problems:   Tobacco use disorder   Chronic obstructive pulmonary disease (HCC)   Severe concentric left ventricular hypertrophy   Paroxysmal atrial fibrillation (HCC)   Acute respiratory failure with hypoxia (HCC)   Mixed hyperlipidemia   Primary hypertension   COPD with acute exacerbation (HCC)   Brief Summary: 80 year old with a history of tobacco abuse, chronic atrial fibrillation, COPD, chronic diastolic CHF, HTN, and HLD who presented to the ER with dyspnea and hypoxia gradually progressive over 24 hours.  EMS was summoned to the house and she was found to have an oxygen saturation of 70%.  Assessment & Plan:   Acute hypoxic respiratory failure -acute bronchospastic COPD exacerbation Hypoxia has resolved.  She was treated with a dose of IV methylprednisolone followed by short course of oral prednisone.  No clinical features suggestive of infectious exacerbation.  No antibiotics were initiated.  Tobacco cessation was repeatedly counseled.  May consider outpatient pulmonology consultation and follow-up.  Patient and daughter have been counseled to have  patient take all her meds during PCP follow-up to adequately counsel regarding use of her inhalers.  She may also take it to her local pharmacy for same.  They verbalized understanding.   Ongoing tobacco abuse Has been counseled again on absolute need to discontinue tobacco abuse.  Continue nicotine patch.   Chronic paroxysmal atrial fibrillation Continue usual home medication regimen to include Eliquis.  Presently in sinus rhythm.  Continue prior home dose of Cardizem and metoprolol which were briefly held due to soft blood pressures which have since stabilized.   HLD Continue home statin dose   Essential HTN -transient asymptomatic hypotension Her antihypertensives were briefly held.  Blood pressures have improved and stabilized.  Resume home dose of metoprolol and Cardizem.   Chronic polyneuropathy Continue usual home medications   Hematuria  Newly noted this admit -reports intermittent episodes at home.  Recommend repeat urine microscopy in 2 to 3 weeks to reassess.  Would also recommend outpatient urology consultation and follow-up to further assess this in detail.  No clinical features suggestive of UTI.  As stated above, patient is on Eliquis.  Chronic diastolic CHF: Compensated.  Continue home dose of Lasix  Thrombocytopenia Stable in the 140s.  Unclear etiology.  Follow CBC in a few days as outpatient.  Anemia, suspect chronic disease Stable.  Outpatient evaluation and follow-up.  Hyperglycemia without known diagnosis of DM: Likely related to steroids.  A1c 5.3 on 08/27/2020. . Consultations: None  Procedures: None   Discharge Instructions  Discharge Instructions     (HEART FAILURE PATIENTS) Call MD:  Anytime you have any of the following symptoms: 1) 3 pound weight gain in 24 hours or 5 pounds in 1 week 2) shortness of breath, with or without a dry hacking cough 3) swelling in the hands, feet or stomach 4) if you have to sleep on extra pillows at night in order to  breathe.   Complete by: As directed    Call MD for:  difficulty breathing, headache or visual disturbances   Complete by: As directed    Call MD for:  extreme fatigue   Complete by: As directed    Call MD for:  persistant dizziness or light-headedness   Complete by: As directed    Call MD for:  persistant nausea and vomiting   Complete by: As directed    Call MD for:  severe uncontrolled pain   Complete by: As directed    Call MD for:  temperature >100.4   Complete by: As directed    Diet - low sodium heart healthy   Complete by: As directed    Increase activity slowly   Complete by: As directed         Medication List     TAKE these medications    albuterol 108 (90 Base) MCG/ACT inhaler Commonly known as: VENTOLIN HFA Inhale 1 puff into the lungs every 6 (six) hours as needed for wheezing or shortness of breath.   ANTIHISTAMINE PO Take 2 tablets by mouth daily as needed (allergies).   apixaban 2.5 MG Tabs tablet Commonly known as: ELIQUIS Take 2.5 mg by mouth 2 (two) times daily.   atorvastatin 20 MG tablet Commonly known as: LIPITOR Take 20 mg by mouth at bedtime.   b complex vitamins tablet Take 1 tablet by mouth daily.   diltiazem 180 MG 24 hr capsule Commonly known as: CARDIZEM CD Take 1 capsule (180 mg total) by mouth daily.   DULoxetine 60 MG capsule Commonly known as: CYMBALTA Take 60 mg by mouth daily.   ferrous sulfate 325 (65 FE) MG EC tablet Take 1 tablet (325 mg total) by mouth daily with breakfast.   folic acid 1 MG tablet Commonly known as: FOLVITE Take 1 mg by mouth daily.   furosemide 20 MG tablet Commonly known as: LASIX Take 20 mg by mouth daily.   metoprolol tartrate 25 MG tablet Commonly known as: LOPRESSOR Take 25 mg by mouth 2 (two) times daily.   multivitamins with iron Tabs tablet Take 1 tablet by mouth daily.   nicotine 21 mg/24hr patch Commonly known as: NICODERM CQ - dosed in mg/24 hours Place 1 patch (21 mg total)  onto the skin daily.   pantoprazole 40 MG tablet Commonly known as: PROTONIX Take 40 mg by mouth daily.   predniSONE 20 MG tablet Commonly known as: DELTASONE Take 2 tablets (40 mg total) by mouth daily with breakfast for 3 days. Start taking on: September 15, 2020   pregabalin 50 MG capsule Commonly known as: LYRICA Take 50 mg by mouth 3 (three) times daily.   thiamine 100 MG tablet Commonly known as: Vitamin B-1 Take 100 mg by mouth daily.   Trelegy Ellipta 100-62.5-25 MCG/INH Aepb Generic drug: Fluticasone-Umeclidin-Vilant Take 1 puff by mouth daily.   Vitamin D3 50 MCG (2000 UT) Tabs Take 1 tablet by mouth daily.   Vortex Valved [employer] by Does not apply route.       Allergies  Allergen Reactions  Bactrim [Sulfamethoxazole-Trimethoprim] Diarrhea and Nausea And Vomiting   Bee Venom Anaphylaxis      Procedures/Studies: DG Chest Port 1 View  Result Date: 09/13/2020 CLINICAL DATA:  Worsening shortness of breath. EXAM: PORTABLE CHEST 1 VIEW COMPARISON:  September 16, 2016 FINDINGS: Diffuse, chronic appearing increased interstitial lung markings are seen. There is no evidence of acute infiltrate, pleural effusion or pneumothorax. The cardiac silhouette is borderline in size. Marked severity calcification of the aortic arch is seen. The visualized skeletal structures are unremarkable. IMPRESSION: Chronic appearing increased interstitial lung markings without acute cardiopulmonary disease. Electronically Signed   By: Virgina Norfolk M.D.   On: 09/13/2020 01:24      Subjective: Patient denies complaints.  Anxious to be discharged.  Reports that she lives at her home with her family, independent of daily activities, not on home oxygen.  She does not see a pulmonologist.  Reports smoking on and off for several years, down to 6 cigarettes/day.  Dyspnea resolved.  No cough or chest pain.  Discharge Exam:  Vitals:   09/14/20 0718 09/14/20 0728 09/14/20 0731  09/14/20 1211  BP: (!) 123/53   121/60  Pulse: 96   97  Resp: 18   19  Temp: 98.4 F (36.9 C)   98.6 F (37 C)  TempSrc: Oral   Oral  SpO2: 99% 95% 95% 96%  Weight:      Height:        General: Pt lying comfortably in bed & appears in no obvious distress.  Pleasant elderly female, moderately built and thinly nourished. Cardiovascular: S1 & S2 heard, RRR, S1/S2 +. No murmurs, rubs, gallops or clicks. No JVD or pedal edema.  Telemetry personally reviewed: SR-ST in the 100s. Respiratory: Clear to auscultation without wheezing, rhonchi or crackles. No increased work of breathing.  Able to speak in full sentences Abdominal:  Non distended, non tender & soft. No organomegaly or masses appreciated. Normal bowel sounds heard. CNS: Alert and oriented. No focal deficits. Extremities: no edema, no cyanosis    The results of significant diagnostics from this hospitalization (including imaging, microbiology, ancillary and laboratory) are listed below for reference.     Microbiology: Recent Results (from the past 240 hour(s))  Urine Culture     Status: Abnormal   Collection Time: 09/13/20  8:51 AM   Specimen: Urine, Clean Catch  Result Value Ref Range Status   Specimen Description URINE, CLEAN CATCH  Final   Special Requests NONE  Final   Culture (A)  Final    <10,000 COLONIES/mL INSIGNIFICANT GROWTH Performed at [facility], [address] [place], [place] [address]    Report Status 09/14/2020 FINAL  Final  Resp Panel by RT-PCR (Flu A&B, Covid) Nasopharyngeal Swab     Status: None   Collection Time: 09/13/20 12:12 PM   Specimen: Nasopharyngeal Swab; Nasopharyngeal(NP) swabs in vial transport medium  Result Value Ref Range Status   SARS Coronavirus 2 by RT PCR NEGATIVE NEGATIVE Final    Comment: (NOTE) SARS-CoV-2 target nucleic acids are NOT DETECTED.  The SARS-CoV-2 RNA is generally detectable in upper respiratory specimens during the acute phase of infection. The  lowest concentration of SARS-CoV-2 viral copies this assay can detect is 138 copies/mL. A negative result does not preclude SARS-Cov-2 infection and should not be used as the sole basis for treatment or other patient management decisions. A negative result may occur with  improper specimen collection/handling, submission of specimen other than nasopharyngeal swab, presence of viral mutation(s)  within the areas targeted by this assay, and inadequate number of viral copies(<138 copies/mL). A negative result must be combined with clinical observations, patient history, and epidemiological information. The expected result is Negative.  Fact Sheet for Patients:  EntrepreneurPulse.com.au  Fact Sheet for Healthcare Providers:  IncredibleEmployment.be  This test is no t yet approved or cleared by the [place] FDA and  has been authorized for detection and/or diagnosis of SARS-CoV-2 by FDA under an Emergency Use Authorization (EUA). This EUA will remain  in effect (meaning this test can be used) for the duration of the COVID-19 declaration under Section 564(b)(1) of the Act, 21 U.S.C.section 360bbb-3(b)(1), unless the authorization is terminated  or revoked sooner.       Influenza A by PCR NEGATIVE NEGATIVE Final   Influenza B by PCR NEGATIVE NEGATIVE Final    Comment: (NOTE) The Xpert Xpress SARS-CoV-2/FLU/RSV plus assay is intended as an aid in the diagnosis of influenza from Nasopharyngeal swab specimens and should not be used as a sole basis for treatment. Nasal washings and aspirates are unacceptable for Xpert Xpress SARS-CoV-2/FLU/RSV testing.  Fact Sheet for Patients: EntrepreneurPulse.com.au  Fact Sheet for Healthcare Providers: IncredibleEmployment.be  This test is not yet approved or cleared by the [place] FDA and has been authorized for detection and/or diagnosis of SARS-CoV-2 by FDA under  an Emergency Use Authorization (EUA). This EUA will remain in effect (meaning this test can be used) for the duration of the COVID-19 declaration under Section 564(b)(1) of the Act, 21 U.S.C. section 360bbb-3(b)(1), unless the authorization is terminated or revoked.  Performed at [facility], [address], [place], [place] [address]      Labs: CBC: Recent Labs  Lab 09/13/20 0135 09/13/20 0425  WBC 12.9* 16.0*  NEUTROABS 10.4* 15.0*  HGB 9.9* 10.1*  HCT 30.7* 30.7*  MCV 98.4 98.4  PLT 141* 143*    Basic Metabolic Panel: Recent Labs  Lab 09/13/20 0135 09/13/20 0425  NA 137 137  K 3.7 3.5  CL 103 104  CO2 27 25  GLUCOSE 146* 222*  BUN 15 16  CREATININE 0.78 0.75  CALCIUM 8.7* 8.6*  MG  --  1.7  PHOS  --  3.4    Liver Function Tests: Recent Labs  Lab 09/13/20 0425  AST 22  ALT 13  ALKPHOS 49  BILITOT 0.7  PROT 6.0*  ALBUMIN 3.1*     Urinalysis    Component Value Date/Time   COLORURINE RED (A) 09/13/2020 0851   APPEARANCEUR TURBID (A) 09/13/2020 0851   LABSPEC  09/13/2020 0851    TEST NOT REPORTED DUE TO COLOR INTERFERENCE OF URINE PIGMENT   PHURINE  09/13/2020 0851    TEST NOT REPORTED DUE TO COLOR INTERFERENCE OF URINE PIGMENT   GLUCOSEU (A) 09/13/2020 0851    TEST NOT REPORTED DUE TO COLOR INTERFERENCE OF URINE PIGMENT   HGBUR (A) 09/13/2020 0851    TEST NOT REPORTED DUE TO COLOR INTERFERENCE OF URINE PIGMENT   BILIRUBINUR (A) 09/13/2020 0851    TEST NOT REPORTED DUE TO COLOR INTERFERENCE OF URINE PIGMENT   KETONESUR (A) 09/13/2020 0851    TEST NOT REPORTED DUE TO COLOR INTERFERENCE OF URINE PIGMENT   PROTEINUR (A) 09/13/2020 0851    TEST NOT REPORTED DUE TO COLOR INTERFERENCE OF URINE PIGMENT   NITRITE (A) 09/13/2020 0851    TEST NOT REPORTED DUE TO COLOR INTERFERENCE OF URINE PIGMENT   LEUKOCYTESUR (A) 09/13/2020 0851    TEST NOT  REPORTED DUE TO COLOR INTERFERENCE OF URINE PIGMENT    I discussed in detail with patient's daughter  via phone, updated care and answered all questions.  Time coordinating discharge: 25 minutes  SIGNED:  Vernell Leep, MD, [place], [facility]. [facility]  To contact the attending provider between 7A-7P or the covering provider during after hours 7P-7A, please log into the web site [email or web address] and access using universal [place] password for that web site. If you do not have the password, please call the hospital operator.

## 2020-09-14 NOTE — Care Management Obs Status (Signed)
[facility] NOTIFICATION   Patient Details  Name: Kelly [name] MRN: [id number] Date of Birth: [birth year]   Medicare Observation Status Notification Given:  Yes    Zenon Mayo, RN 09/14/2020, 1:16 PM

## 2020-09-14 NOTE — Evaluation (Signed)
Occupational Therapy Evaluation and Discharge Summary Patient Details Name: Kelly [name] MRN: [id number] DOB: 1940-12-17 Today's Date: 09/14/2020    History of Present Illness 80 y.o. female presents [facility] ED on 09/12/2020 with dyspnea and hypoxic respiratory failure. PMH includes tobacco use, atrial fibrillation, COPD, diastolic CHF, hypertension, dyslipidemia.   Clinical Impression   Pt admitted with the above diagnosis and overall has returned to close to baseline level of functioning for adls. Energy conservation techniques reviewed and safety tips for home discussed.  Pt walked in hallway on RA and O2 sats at 98% at end of walk. Pt not in need of further OT services at this time.     Follow Up Recommendations  No OT follow up    Equipment Recommendations  Other (comment) (when shower fixed, she will have a chair to sit on to bathe.)    Recommendations for Other Services       Precautions / Restrictions Precautions Precautions: Fall Precaution Comments: monitor SpO2 Restrictions Weight Bearing Restrictions: No      Mobility Bed Mobility Overal bed mobility: Independent             General bed mobility comments: No physical assist needed    Transfers Overall transfer level: Independent Equipment used: None                  Balance Overall balance assessment: Mild deficits observed, not formally tested Sitting-balance support: No upper extremity supported;Feet supported Sitting balance-Leahy Scale: Good     Standing balance support: No upper extremity supported;During functional activity Standing balance-Leahy Scale: Good                             ADL either performed or assessed with clinical judgement   ADL Overall ADL's : Modified independent                                       General ADL Comments: Pt completes all adls with no physical assist. Spent a great amount of session recommending how to do adls more  efficiently, talking about how to make home set up more efficient and about pt taking her time during tasks.     Vision Baseline Vision/History: 1 Wears glasses Ability to See in Adequate Light: 0 Adequate Patient Visual Report: No change from baseline Vision Assessment?: No apparent visual deficits     Perception Perception Perception Tested?: No   Praxis Praxis Praxis tested?: Within functional limits    Pertinent Vitals/Pain Pain Assessment: No/denies pain     Hand Dominance Right   Extremity/Trunk Assessment Upper Extremity Assessment Upper Extremity Assessment: Overall WFL for tasks assessed   Lower Extremity Assessment Lower Extremity Assessment: Defer to PT evaluation   Cervical / Trunk Assessment Cervical / Trunk Assessment: Normal   Communication Communication Communication: No difficulties   Cognition Arousal/Alertness: Awake/alert Behavior During Therapy: WFL for tasks assessed/performed Overall Cognitive Status: Within Functional Limits for tasks assessed                                 General Comments: Pt talks quickly and reports being anxious   General Comments  Pt with blood in urine.  Pt states medical team is aware.    Exercises     Shoulder Instructions  Home Living Family/patient expects to be discharged to:: Private residence Living Arrangements: Children;Other relatives Available Help at Discharge: Family;Available 24 hours/day Type of Home: House Home Access: Stairs to enter [employer] of Steps: 5 Entrance Stairs-Rails: None Home Layout: Multi-level Alternate Level Stairs-Number of Steps: 13 Alternate Level Stairs-Rails: Right Bathroom Shower/Tub: Tub/shower unit;Walk-in shower;Other (comment) (walk in is currently being repaired.)   Bathroom Toilet: Standard     Home Equipment: None   Additional Comments: Pt does a lot of cooking for family      Prior Functioning/Environment Level of  Independence: Independent        Comments: pt reports ascending steps to shower once every few days. Pt ambulates for household and limited community distances.        OT Problem List:        OT Treatment/Interventions:      OT Goals(Current goals can be found in the care plan section) Acute Rehab OT Goals Patient Stated Goal: to return home OT Goal Formulation: All assessment and education complete, DC therapy  OT Frequency:     Barriers to D/C:            Co-evaluation              AM-PAC OT "6 Clicks" Daily Activity     Outcome Measure Help from another person eating meals?: None Help from another person taking care of personal grooming?: None Help from another person toileting, which includes using toliet, bedpan, or urinal?: None Help from another person bathing (including washing, rinsing, drying)?: None Help from another person to put on and taking off regular upper body clothing?: None Help from another person to put on and taking off regular lower body clothing?: None 6 Click Score: 24   End of Session Nurse Communication: Mobility status  Activity Tolerance: Patient tolerated treatment well Patient left: in bed;with bed alarm set;with call bell/phone within reach  OT Visit Diagnosis: Unsteadiness on feet (R26.81)                Time: [id number] OT Time Calculation (min): 18 min Charges:  OT General Charges $OT Visit: 1 Visit OT Evaluation $OT Eval Low Complexity: 1 Low  Glenford Peers 09/14/2020, 9:14 AM

## 2020-09-14 NOTE — Discharge Instructions (Addendum)
Please get your medications reviewed and adjusted by your Primary MD. ° °Please request your Primary MD to go over all Hospital Tests and Procedure/Radiological results at the follow up, please get all Hospital records sent to your Prim MD by signing hospital release before you go home. ° °If you had Pneumonia of Lung problems at the Hospital: °Please get a 2 view Chest X ray done in 6-8 weeks after hospital discharge or sooner if instructed by your Primary MD. ° °If you have Congestive Heart Failure: °Please call your Cardiologist or Primary MD anytime you have any of the following symptoms:  °1) 3 pound weight gain in 24 hours or 5 pounds in 1 week  °2) shortness of breath, with or without a dry hacking cough  °3) swelling in the hands, feet or stomach  °4) if you have to sleep on extra pillows at night in order to breathe ° °Follow cardiac low salt diet and 1.5 lit/day fluid restriction. ° °If you have diabetes °Accuchecks 4 times/day, Once in AM empty stomach and then before each meal. °Log in all results and show them to your primary doctor at your next visit. °If any glucose reading is under 80 or above 300 call your primary MD immediately. ° °If you have Seizure/Convulsions/Epilepsy: °Please do not drive, operate heavy machinery, participate in activities at heights or participate in high speed sports until you have seen by Primary MD or a Neurologist and advised to do so again. ° °If you had Gastrointestinal Bleeding: °Please ask your Primary MD to check a complete blood count within one week of discharge or at your next visit. Your endoscopic/colonoscopic biopsies that are pending at the time of discharge, will also need to followed by your Primary MD. ° °Get Medicines reviewed and adjusted. °Please take all your medications with you for your next visit with your Primary MD ° °Please request your Primary MD to go over all hospital tests and procedure/radiological results at the follow up, please ask your  Primary MD to get all Hospital records sent to his/her office. ° °If you experience worsening of your admission symptoms, develop shortness of breath, life threatening emergency, suicidal or homicidal thoughts you must seek medical attention immediately by calling 911 or calling your MD immediately  if symptoms less severe. ° °You must read complete instructions/literature along with all the possible adverse reactions/side effects for all the Medicines you take and that have been prescribed to you. Take any new Medicines after you have completely understood and accpet all the possible adverse reactions/side effects.  ° °Do not drive or operate heavy machinery when taking Pain medications.  ° °Do not take more than prescribed Pain, Sleep and Anxiety Medications ° °Special Instructions: If you have smoked or chewed Tobacco  in the last 2 yrs please stop smoking, stop any regular Alcohol  and or any Recreational drug use. ° °Wear Seat belts while driving. ° °Please note °You were cared for by a hospitalist during your hospital stay. If you have any questions about your discharge medications or the care you received while you were in the hospital after you are discharged, you can call the unit and asked to speak with the hospitalist on call if the hospitalist that took care of you is not available. Once you are discharged, your primary care physician will handle any further medical issues. Please note that NO REFILLS for any discharge medications will be authorized once you are discharged, as it is imperative that you   return to your primary care physician (or establish a relationship with a primary care physician if you do not have one) for your aftercare needs so that they can reassess your need for medications and monitor your lab values.  You can reach the hospitalist office at phone [phone] or fax [phone]   If you do not have a primary care physician, you can call [phone] for a physician  referral.  ================================================================================================================  Information on my medicine - ELIQUIS (apixaban)  Why was Eliquis prescribed for you? Eliquis was prescribed for you to reduce the risk of a blood clot forming that can cause a stroke if you have a medical condition called atrial fibrillation (a type of irregular heartbeat).  What do You need to know about Eliquis ? Take your Eliquis TWICE DAILY - one tablet in the morning and one tablet in the evening with or without food. If you have difficulty swallowing the tablet whole please discuss with your pharmacist how to take the medication safely.  Take Eliquis exactly as prescribed by your doctor and DO NOT stop taking Eliquis without talking to the doctor who prescribed the medication.  Stopping may increase your risk of developing a stroke.  Refill your prescription before you run out.  After discharge, you should have regular check-up appointments with your healthcare provider that is prescribing your Eliquis.  In the future your dose may need to be changed if your kidney function or weight changes by a significant amount or as you get older.  What do you do if you miss a dose? If you miss a dose, take it as soon as you remember on the same day and resume taking twice daily.  Do not take more than one dose of ELIQUIS at the same time to make up a missed dose.  Important Safety Information A possible side effect of Eliquis is bleeding. You should call your healthcare provider right away if you experience any of the following: Bleeding from an injury or your nose that does not stop. Unusual colored urine (red or dark brown) or unusual colored stools (red or black). Unusual bruising for unknown reasons. A serious fall or if you hit your head (even if there is no bleeding).  Some medicines may interact with Eliquis and might increase your risk of bleeding or  clotting while on Eliquis. To help avoid this, consult your healthcare provider or pharmacist prior to using any new prescription or non-prescription medications, including herbals, vitamins, non-steroidal anti-inflammatory drugs (NSAIDs) and supplements.  This website has more information on Eliquis (apixaban): http://www.eliquis.com/eliquis/home

## 2020-09-23 LAB — LIPID PANEL WITH LDL/HDL RATIO
Cholesterol, Total: 234 mg/dL — ABNORMAL HIGH (ref 100–199)
HDL: 56 mg/dL (ref >39)
LDL Chol Calc (NIH): 150 mg/dL — ABNORMAL HIGH (ref 0–99)
LDL/HDL Ratio: 2.7 ratio (ref 0.0–3.2)
Triglycerides: 156 mg/dL — ABNORMAL HIGH (ref 0–149)
VLDL Cholesterol Cal: 28 mg/dL (ref 5–40)

## 2020-09-24 NOTE — Progress Notes (Signed)
Will discuss at upcoming office visit

## 2020-09-24 NOTE — Progress Notes (Signed)
Seeing soon

## 2020-09-29 NOTE — Progress Notes (Signed)
Primary Physician/Referring:  Joya Gaskins, FNP  Patient ID: Kelly [name], female    DOB: Sep 21, 1940, 80 y.o.   MRN: [id number]  Chief Complaint  Patient presents with   Atrial Fibrillation   Hypertension   Hyperlipidemia   Follow-up   HPI:    Kelly [name]  is a 80 y.o. Caucasian female patient with paroxysmal atrial fibrillation on chronic Eliquis therapy, hypertension, hyperlipidemia, chronic back pain and lumbar stenosis with radiculopathy, ongoing tobacco use disorder with COPD (with chronic dyspnea on exertion and wheezing) with reactive airway disease referred to me for evaluation and management of paroxysmal atrial fibrillation and to establish care.  She has moved from [place] to [place] to be closer to her daughter in February 2022.  Diagnosed with atrial fibrillation in approximately 2018.  Patient admitted 09/13/2020 - 09/14/2020 with COPD exacerbation and acute on chronic diastolic heart failure.  She was last seen in our office 08/13/2020 by Dr. Einar Gip at which time diltiazem 20 mg daily was added for heart rate and blood pressure control.  Also added Zetia 10 mg daily with repeat lipid profile testing.  Repeat lipid profile testing revealed LDL 150, LDL goal <70.  Overall patient is feeling well without specific complaints today.  She has been working on increasing physical activity currently walking 5 to 6 minutes/day without issue.  Unfortunately she does continue to smoke although she is working on quitting.  She also admits to poor dietary compliance.  Denies chest pain, palpitations, syncope, near syncope, dizziness, orthopnea, PND, leg swelling.  She does have chronic dyspnea on exertion which remained stable, as well as wheezing due to underlying COPD.  She is tolerating anticoagulation without bleeding diathesis.  Past Medical History:  Diagnosis Date   Anemia    Anxiety    B12 deficiency 05/2015   B12 was 184   COPD (chronic obstructive pulmonary disease)  (HCC)    Crohn's disease, small intestine ([place]) 02/19/2009   Depression 10/2013   chronic recurrent major depressive disorder   H/O vitamin D deficiency 08/04/2008   Recurrent dislocation of hip, right    Past Surgical History:  Procedure Laterality Date   JOINT REPLACEMENT     Right hip, 2015   LAPAROSCOPY  06/03/2016   Duodenal ulcer repair   Family History  Problem Relation Age of Onset   Heart disease Mother    Alcohol abuse Mother    Colon cancer Sister    Breast cancer Sister    Heart attack Brother    Cancer Neg Hx    Diabetes Neg Hx     Social History   Tobacco Use   Smoking status: Every Day    Types: Cigarettes   Smokeless tobacco: Never   Tobacco comments:    trying to quit, wearing nicotine patch. Cautioned against smoking and wearing patch. Smokes when patch is taken off.  Substance Use Topics   Alcohol use: Yes    Comment: OCC   Marital Status: Married  ROS  Review of Systems  Constitutional: Negative for malaise/fatigue and weight gain.  Cardiovascular:  Positive for dyspnea on exertion (stable). Negative for chest pain, claudication, leg swelling, near-syncope, orthopnea, palpitations, paroxysmal nocturnal dyspnea and syncope.  Respiratory:  Positive for cough and wheezing (chronic). Negative for shortness of breath.   Musculoskeletal:  Positive for arthritis (right hip).  Gastrointestinal:  Negative for melena.  Neurological:  Negative for dizziness.  Psychiatric/Behavioral:  The patient is nervous/anxious.   Objective  Blood pressure 121/68, [name],  temperature 97.9 F (36.6 C), height 5' 2"  (1.575 m), weight 102 lb 12.8 oz (46.6 kg), SpO2 96 %. Body mass index is 18.8 kg/m.  Vitals with BMI 09/30/2020 09/14/2020 09/14/2020  Height 5' 2"  - -  Weight 102 lbs 13 oz - -  BMI 83.2 - -  Systolic 919 166 060  Diastolic 68 60 53  [name] 97 96     Physical Exam Vitals reviewed.  Constitutional:      General: She is not in acute distress.     Appearance: She is underweight. She is ill-appearing.  Neck:     Vascular: Carotid bruit (bilateral) present. No JVD.  Cardiovascular:     Rate and Rhythm: Normal rate and regular rhythm.     Pulses:          Femoral pulses are 1+ on the right side and 2+ on the left side.      Popliteal pulses are 0 on the right side and 2+ on the left side.       Dorsalis pedis pulses are 0 on the right side and 0 on the left side.       Posterior tibial pulses are 0 on the right side and 0 on the left side.     Heart sounds: Murmur heard.  Harsh midsystolic murmur is present with a grade of 3/6 at the upper right sternal border and apex radiating to the neck.    No gallop.  Pulmonary:     Effort: Pulmonary effort is normal.     Breath sounds: Wheezing (bilateral diffuse) and rales (bilateral diffuse) present.  Musculoskeletal:        General: No swelling.   Physical exam stable compared to previous.   Laboratory examination:   CMP Latest Ref Rng & Units 09/13/2020 09/13/2020 08/16/2016  Glucose 70 - 99 mg/dL 222(H) 146(H) 105(H)  BUN 8 - 23 mg/dL 16 15 12   Creatinine 0.44 - 1.00 mg/dL 0.75 0.78 0.72  Sodium 135 - 145 mmol/L 137 137 139  Potassium 3.5 - 5.1 mmol/L 3.5 3.7 3.7  Chloride 98 - 111 mmol/L 104 103 100  CO2 22 - 32 mmol/L 25 27 31   Calcium 8.9 - 10.3 mg/dL 8.6(L) 8.7(L) 8.7  Total Protein 6.5 - 8.1 g/dL 6.0(L) - -  Total Bilirubin 0.3 - 1.2 mg/dL 0.7 - -  Alkaline Phos 38 - 126 U/L 49 - -  AST 15 - 41 U/L 22 - -  ALT 0 - 44 U/L 13 - -   CBC Latest Ref Rng & Units 09/13/2020 09/13/2020 08/16/2016  WBC 4.0 - 10.5 K/uL 16.0(H) 12.9(H) 7.2  Hemoglobin 12.0 - 15.0 g/dL 10.1(L) 9.9(L) 10.3(L)  Hematocrit 36.0 - 46.0 % 30.7(L) 30.7(L) 31.9(L)  Platelets 150 - 400 K/uL 143(L) 141(L) 363.0   Lipid Panel     Component Value Date/Time   CHOL 234 (H) 09/22/2020 0918   TRIG 156 (H) 09/22/2020 0918   HDL 56 09/22/2020 0918   LDLCALC 150 (H) 09/22/2020 0918   HEMOGLOBIN A1C No results  found for: HGBA1C, MPG TSH No results for input(s): TSH in the last 8760 hours.l External labs:  05/22/2020: Hb 10.8/HCT 32.9, platelets 241, normal indicis.  Iron studies revealed mild iron deficiency anemia.  TSH is normal at 3.28.  Total cholesterol 227, triglycerides 130, HDL 56, LDL 143.  Non-HDL cholesterol 171.  BUN 13, creatinine 0.66, potassium 4.3, EGFR 89 mL, serum glucose 105 mg. Allergies   Allergies  Allergen Reactions  Bactrim [Sulfamethoxazole-Trimethoprim] Diarrhea and Nausea And Vomiting   Bee Venom Anaphylaxis      Medications Prior to Visit:   Outpatient Medications Prior to Visit  Medication Sig Dispense Refill   Acetaminophen-Codeine 300-30 MG tablet TAKE 1 TABLET BY MOUTH EVERY 6-8 HOURS AS NEEDED FOR PAIN     albuterol (VENTOLIN HFA) 108 (90 Base) MCG/ACT inhaler Inhale 1 puff into the lungs every 6 (six) hours as needed for wheezing or shortness of breath.     apixaban (ELIQUIS) 2.5 MG TABS tablet Take 2.5 mg by mouth 2 (two) times daily.     b complex vitamins tablet Take 1 tablet by mouth daily.     Cholecalciferol (VITAMIN D3) 2000 units TABS Take 1 tablet by mouth daily.     DULoxetine (CYMBALTA) 60 MG capsule Take 60 mg by mouth daily.     ferrous sulfate 325 (65 FE) MG EC tablet Take 1 tablet (325 mg total) by mouth daily with breakfast. 90 tablet 1   folic acid (FOLVITE) 1 MG tablet Take 1 mg by mouth daily.     furosemide (LASIX) 20 MG tablet Take 20 mg by mouth daily.     levalbuterol (XOPENEX HFA) 45 MCG/ACT inhaler INHALE 1 PUFF BY MOUTH EVERY 6 HOURS     metoprolol succinate (TOPROL-XL) 25 MG 24 hr tablet Take 1 tablet by mouth daily.     Multiple Vitamin (MULTI-VITAMIN) tablet Take 1 tablet by mouth daily.     nicotine (NICODERM CQ - DOSED IN MG/24 HOURS) 21 mg/24hr patch Place 1 patch (21 mg total) onto the skin daily. 28 patch 0   pantoprazole (PROTONIX) 40 MG tablet Take 40 mg by mouth daily.     pregabalin (LYRICA) 50 MG capsule Take 50  mg by mouth 3 (three) times daily.     thiamine (VITAMIN B-1) 100 MG tablet Take 100 mg by mouth daily.     TRELEGY ELLIPTA 100-62.5-25 MCG/INH AEPB Take 1 puff by mouth daily.     atorvastatin (LIPITOR) 20 MG tablet Take 20 mg by mouth at bedtime.     Cholecalciferol 50 MCG (2000 UT) TABS Take by mouth.     ezetimibe (ZETIA) 10 MG tablet Take by mouth.     Triprolidine-Pseudoephedrine (ANTIHISTAMINE PO) Take 2 tablets by mouth daily as needed (allergies).     digoxin (LANOXIN) 0.125 MG tablet Take by mouth.     Spacer/Aero-Holding Chambers (VORTEX VALVED HOLDING CHAMBER) DEVI by Does not apply route. (Patient not taking: Reported on 09/30/2020)     diltiazem (CARDIZEM CD) 180 MG 24 hr capsule Take 1 capsule (180 mg total) by mouth daily. (Patient not taking: Reported on 09/30/2020) 30 capsule 2   diltiazem (TIAZAC) 180 MG 24 hr capsule Take by mouth. (Patient not taking: Reported on 09/30/2020)     metoprolol tartrate (LOPRESSOR) 25 MG tablet Take 25 mg by mouth 2 (two) times daily. (Patient not taking: Reported on 09/30/2020)     Multiple Vitamins-Iron (MULTIVITAMINS WITH IRON) TABS tablet Take 1 tablet by mouth daily.     No facility-administered medications prior to visit.   Final Medications at End of Visit    Current Meds  Medication Sig   Acetaminophen-Codeine 300-30 MG tablet TAKE 1 TABLET BY MOUTH EVERY 6-8 HOURS AS NEEDED FOR PAIN   albuterol (VENTOLIN HFA) 108 (90 Base) MCG/ACT inhaler Inhale 1 puff into the lungs every 6 (six) hours as needed for wheezing or shortness of breath.   apixaban (ELIQUIS) 2.5 MG  TABS tablet Take 2.5 mg by mouth 2 (two) times daily.   b complex vitamins tablet Take 1 tablet by mouth daily.   Bempedoic Acid-Ezetimibe (NEXLIZET) 180-10 MG TABS Take 1 tablet by mouth daily.   Cholecalciferol (VITAMIN D3) 2000 units TABS Take 1 tablet by mouth daily.   DULoxetine (CYMBALTA) 60 MG capsule Take 60 mg by mouth daily.   ferrous sulfate 325 (65 FE) MG EC tablet  Take 1 tablet (325 mg total) by mouth daily with breakfast.   folic acid (FOLVITE) 1 MG tablet Take 1 mg by mouth daily.   furosemide (LASIX) 20 MG tablet Take 20 mg by mouth daily.   levalbuterol (XOPENEX HFA) 45 MCG/ACT inhaler INHALE 1 PUFF BY MOUTH EVERY 6 HOURS   metoprolol succinate (TOPROL-XL) 25 MG 24 hr tablet Take 1 tablet by mouth daily.   Multiple Vitamin (MULTI-VITAMIN) tablet Take 1 tablet by mouth daily.   nicotine (NICODERM CQ - DOSED IN MG/24 HOURS) 21 mg/24hr patch Place 1 patch (21 mg total) onto the skin daily.   pantoprazole (PROTONIX) 40 MG tablet Take 40 mg by mouth daily.   pregabalin (LYRICA) 50 MG capsule Take 50 mg by mouth 3 (three) times daily.   thiamine (VITAMIN B-1) 100 MG tablet Take 100 mg by mouth daily.   TRELEGY ELLIPTA 100-62.5-25 MCG/INH AEPB Take 1 puff by mouth daily.   [DISCONTINUED] atorvastatin (LIPITOR) 20 MG tablet Take 20 mg by mouth at bedtime.   [DISCONTINUED] Cholecalciferol 50 MCG (2000 UT) TABS Take by mouth.   [DISCONTINUED] ezetimibe (ZETIA) 10 MG tablet Take by mouth.   [DISCONTINUED] Triprolidine-Pseudoephedrine (ANTIHISTAMINE PO) Take 2 tablets by mouth daily as needed (allergies).    Medications after today's encounter: Current Outpatient Medications  Medication Instructions   Acetaminophen-Codeine 300-30 MG tablet TAKE 1 TABLET BY MOUTH EVERY 6-8 HOURS AS NEEDED FOR PAIN   albuterol (VENTOLIN HFA) 108 (90 Base) MCG/ACT inhaler 1 puff, Inhalation, Every 6 hours PRN   apixaban (ELIQUIS) 2.5 mg, Oral, 2 times daily   atorvastatin (LIPITOR) 40 mg, Oral, Daily at bedtime   b complex vitamins tablet 1 tablet, Oral, Daily   Bempedoic Acid-Ezetimibe (NEXLIZET) 180-10 MG TABS 1 tablet, Oral, Daily   Cholecalciferol (VITAMIN D3) 2000 units TABS 1 tablet, Oral, Daily   digoxin (LANOXIN) 0.125 MG tablet Oral   DULoxetine (CYMBALTA) 60 mg, Oral, Daily   ferrous sulfate 325 mg, Oral, Daily with breakfast   folic acid (FOLVITE) 1 mg, Oral,  Daily   furosemide (LASIX) 20 mg, Oral, Daily   levalbuterol (XOPENEX HFA) 45 MCG/ACT inhaler INHALE 1 PUFF BY MOUTH EVERY 6 HOURS   metoprolol succinate (TOPROL-XL) 25 MG 24 hr tablet 1 tablet, Oral, Daily   Multiple Vitamin (MULTI-VITAMIN) tablet 1 tablet, Oral, Daily   nicotine (NICODERM CQ - DOSED IN MG/24 HOURS) 21 mg, Transdermal, Daily   pantoprazole (PROTONIX) 40 mg, Oral, Daily   pregabalin (LYRICA) 50 mg, Oral, 3 times daily   Spacer/Aero-Holding Chambers (VORTEX VALVED HOLDING CHAMBER) DEVI by Does not apply route.   thiamine (VITAMIN B-1) 100 mg, Oral, Daily   TRELEGY ELLIPTA 100-62.5-25 MCG/INH AEPB 1 puff, Oral, Daily    Radiology:   Chest x-ray PA and lateral view 06/11/2019: Cardiomegaly with atherosclerotic changes of the aorta. New small left pleural effusion. COPD changes with fine reticular prominence and hyperinflation.  Cardiac Studies:   Echocardiogram 08/22/2016: - Left ventricle: The cavity size was normal. There was severe  concentric hypertrophy. Cordal SAM is noted. Systolic  function   was vigorous. The estimated ejection fraction was in the range of  65% to 70%. There was dynamic obstruction during Valsalvain the mid cavity, with mid-cavity obliteration, a peak velocity of 421 cm/sec, and a peak gradient of 71 mm Hg. Doppler parameters are consistent with abnormal left ventricular relaxation (grade 1  diastolic dysfunction). The E/e&' ratio is >15, suggesting  elevated LV filling pressure. - Mitral valve: Mildly thickened leaflets. Cordal SAM is noted.   There was trivial regurgitation. - Left atrium: The atrium was normal in size. - Inferior vena cava: The vessel was normal in size. The   respirophasic diameter changes were in the normal range (>= 50%), consistent with normal central venous pressure.   Impressions: - LVEF 65-70%, severe concentric LVH with cordal SAM, dynamic   mid-cavitary gradient of 41 mmHg at rest that increases to 71   mmHg with  valsalva.  PCV MYOCARDIAL PERFUSION WITH LEXISCAN 06/22/2020 Lexiscan nuclear stress test performed using 1-day protocol. Normal myocardial perfusion. Stress LVEF calculated 44%, but visually appears normal. Low risk study.   PCV ECHOCARDIOGRAM COMPLETE 06/17/2020 Left ventricle cavity is normal in size. Severe concentric hypertrophy of the left ventricle. Normal global wall motion. Normal LV systolic function with EF 58%. Left atrial cavity is severely dilated. Trileaflet aortic valve.  Mild (Grade I) aortic regurgitation. LVOT max gradient 14 mmHg likely due to LVOT obstruction and not valvular aortic stenosis. No obvious SAM. Mildly restricted mitral valve leaflets. Moderate to severe mitral regurgitation. No evidence of pulmonary hypertension. 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 Previous echocardiogram report in 2018 noted only trivial mitral regurgitation. No other significant change noted.    Carotid artery duplex 06/17/2020: Stenosis in the right internal carotid artery (50-69%). Stenosis in the right external carotid artery (<50%). Stenosis in the left internal carotid artery (>=70%).  The left PSV internal/common carotid artery ratio of 4.48 is consistent with a stenosis of >70%. Left common carotid bulb with <50% calcific stenosis. Stenosis in the left external carotid artery (>50%). Antegrade right vertebral artery flow. Antegrade left vertebral artery flow. Follow up in six months is appropriate if clinically indicated.  EKG:  09/30/2020:probable sinus rhythm (cannot exclude ectopic atrial rhythm) at a rate of 72 bpm.  Normal axis.  LVH with secondary repolarization abnormality, cannot exclude lateral ischemia.  08/13/2020: Normal sinus rhythm at rate of 93 bpm, left atrial enlargement, normal axis.  Anteroseptal infarct old.  LVH with repolarization abnormality, cannot exclude lateral ischemia.  No significant change from 06/04/2020.     Assessment      ICD-10-CM   1. Paroxysmal atrial fibrillation (HCC)  I48.0 EKG 12-Lead    2. Asymptomatic bilateral carotid artery stenosis  I65.23 atorvastatin (LIPITOR) 40 MG tablet    Bempedoic Acid-Ezetimibe (NEXLIZET) 180-10 MG TABS    Lipid Panel With LDL/HDL Ratio    Lipid Panel With LDL/HDL Ratio    3. Primary hypertension  I10 EKG 12-Lead    4. Hypercholesteremia  E78.00 EKG 12-Lead    atorvastatin (LIPITOR) 40 MG tablet    Bempedoic Acid-Ezetimibe (NEXLIZET) 180-10 MG TABS    Lipid Panel With LDL/HDL Ratio    Lipid Panel With LDL/HDL Ratio    5. Nicotine dependence, cigarettes, uncomplicated  Z79.150       CHA2DS2-VASc Score is 5.  Yearly risk of stroke: 7.2% (F, A, HTN, Vasc Dz).  Score of 1=0.6; 2=2.2; 3=3.2; 4=4.8; 5=7.2; 6=9.8; 7=>9.8) -(CHF; HTN; vasc disease DM,  Female = 1; Age <65 =0;  65-74 = 1,  >75 =2; stroke/embolism= 2).    Meds ordered this encounter  Medications   atorvastatin (LIPITOR) 40 MG tablet    Sig: Take 1 tablet (40 mg total) by mouth at bedtime.    Dispense:  90 tablet    Refill:  3   Bempedoic Acid-Ezetimibe (NEXLIZET) 180-10 MG TABS    Sig: Take 1 tablet by mouth daily.    Dispense:  30 tablet    Refill:  3    Orders Placed This Encounter  Procedures   Lipid Panel With LDL/HDL Ratio    Standing Status:   Future    Number of Occurrences:   1    Standing Expiration Date:   09/30/2021   EKG 12-Lead   Recommendations:   Kelly [name] is a 79 y.o. Caucasian female with paroxysmal atrial fibrillation on chronic Eliquis therapy, hypertension, hyperlipidemia, chronic back pain and lumbar stenosis with radiculopathy, ongoing tobacco use disorder with COPD with reactive airway disease referred to me for evaluation and management of paroxysmal atrial fibrillation and to establish care.  She has moved from [place] to [place] to be closer to her daughter in February 2022.  Patient admitted 09/13/2020 - 09/14/2020 with COPD exacerbation and acute on chronic  diastolic heart failure.  She was last seen in our office 08/13/2020 by Dr. Einar Gip at which time diltiazem 20 mg daily was added for heart rate and blood pressure control.  Also added Zetia 10 mg daily with repeat lipid profile testing.  Repeat lipid profile testing revealed LDL 150, LDL goal <70.  We will therefore switch patient from Zetia to [place] (adding bempedoic acid), as patient prefers not to pursue injectable lipid management agents.  Reviewed and discussed with patient regarding results of echocardiogram, stress test, and carotid artery duplex, details above.  Echocardiogram reveals moderate to severe mitral regurgitation, which had previously been trivial otherwise it is unchanged with LVEF 58%.  Stress test low risk overall, and carotid artery duplex will repeat in 6 months.  Despite progression of mitral regurgitation patient's dyspnea is stable and she is feeling well overall.  We will repeat lipid profile testing in 3 months.  Again counseled patient regarding the importance of smoking cessation as well as aggressive lipid control.  3 minutes of today's office visit were spent during smoking cessation counseling.  Follow-up in 3 months, sooner if needed, for mitral vegetation, paroxysmal atrial fibrillation, HFpEF, and hyperlipidemia.   Kelly Berthold, PA-C 10/02/2020, 1:19 PM Office: [phone]

## 2020-09-30 ENCOUNTER — Ambulatory Visit: Payer: Medicare Other | Admitting: Student

## 2020-09-30 ENCOUNTER — Other Ambulatory Visit: Payer: Self-pay

## 2020-09-30 ENCOUNTER — Encounter: Payer: Self-pay | Admitting: Student

## 2020-09-30 ENCOUNTER — Ambulatory Visit: Payer: Medicare Other | Admitting: Cardiology

## 2020-09-30 VITALS — BP 121/68 | HR 72 | Temp 97.9°F | Ht 62.0 in | Wt 102.8 lb

## 2020-09-30 DIAGNOSIS — F17210 Nicotine dependence, cigarettes, uncomplicated: Secondary | ICD-10-CM

## 2020-09-30 DIAGNOSIS — I6523 Occlusion and stenosis of bilateral carotid arteries: Secondary | ICD-10-CM

## 2020-09-30 DIAGNOSIS — E7800 Pure hypercholesterolemia, unspecified: Secondary | ICD-10-CM

## 2020-09-30 DIAGNOSIS — I10 Essential (primary) hypertension: Secondary | ICD-10-CM

## 2020-09-30 DIAGNOSIS — I480 Paroxysmal atrial fibrillation: Secondary | ICD-10-CM

## 2020-09-30 MED ORDER — NEXLIZET 180-10 MG PO TABS: 1.0000 | ORAL_TABLET | Freq: Every day | ORAL | 3 refills | Status: DC

## 2020-09-30 MED ORDER — ATORVASTATIN CALCIUM 40 MG PO TABS: 40.0000 mg | ORAL_TABLET | Freq: Every day | ORAL | 3 refills | Status: DC

## 2020-10-05 ENCOUNTER — Other Ambulatory Visit: Payer: Self-pay

## 2020-10-05 MED ORDER — DIGOXIN 125 MCG PO TABS: 0.1250 mg | ORAL_TABLET | Freq: Every day | ORAL | 0 refills | Status: DC

## 2020-10-31 LAB — LIPID PANEL WITH LDL/HDL RATIO
Cholesterol, Total: 214 mg/dL — ABNORMAL HIGH (ref 100–199)
HDL: 58 mg/dL (ref >39)
LDL Chol Calc (NIH): 126 mg/dL — ABNORMAL HIGH (ref 0–99)
LDL/HDL Ratio: 2.2 ratio (ref 0.0–3.2)
Triglycerides: 169 mg/dL — ABNORMAL HIGH (ref 0–149)
VLDL Cholesterol Cal: 30 mg/dL (ref 5–40)

## 2020-11-04 NOTE — Progress Notes (Signed)
Called pt to inform her about her lab results. Pt mention she agrees and understood.

## 2020-11-05 NOTE — Progress Notes (Signed)
So she wants to discuss at next office visit?

## 2020-11-10 ENCOUNTER — Other Ambulatory Visit: Payer: Self-pay | Admitting: Student

## 2020-11-10 MED ORDER — REPATHA 140 MG/ML ~~LOC~~ SOSY: 1.0000 mL | PREFILLED_SYRINGE | SUBCUTANEOUS | 3 refills | Status: DC

## 2020-11-10 NOTE — Progress Notes (Signed)
She agrees with the injection

## 2020-11-10 NOTE — Progress Notes (Signed)
Kelly [name] has been sent. Please let patient know we can do it for her or teach her if need be.

## 2020-11-12 ENCOUNTER — Other Ambulatory Visit: Payer: Self-pay

## 2020-11-12 MED ORDER — REPATHA 140 MG/ML ~~LOC~~ SOSY: 1.0000 mL | PREFILLED_SYRINGE | SUBCUTANEOUS | 3 refills | Status: DC

## 2020-11-16 ENCOUNTER — Telehealth: Payer: Self-pay | Admitting: Student

## 2020-11-18 ENCOUNTER — Other Ambulatory Visit: Payer: Self-pay | Admitting: Cardiology

## 2020-11-18 DIAGNOSIS — I10 Essential (primary) hypertension: Secondary | ICD-10-CM

## 2020-12-30 ENCOUNTER — Other Ambulatory Visit: Payer: Self-pay

## 2020-12-30 ENCOUNTER — Ambulatory Visit: Payer: Medicare Other

## 2020-12-30 DIAGNOSIS — I6523 Occlusion and stenosis of bilateral carotid arteries: Secondary | ICD-10-CM

## 2020-12-30 NOTE — Progress Notes (Signed)
You are seeing her soon

## 2020-12-31 ENCOUNTER — Other Ambulatory Visit: Payer: Self-pay | Admitting: Student

## 2021-01-06 ENCOUNTER — Ambulatory Visit: Payer: Medicare Other | Admitting: Student

## 2021-01-13 ENCOUNTER — Ambulatory Visit: Payer: Medicare Other | Admitting: Student

## 2021-01-13 ENCOUNTER — Encounter: Payer: Self-pay | Admitting: Student

## 2021-01-13 ENCOUNTER — Other Ambulatory Visit: Payer: Self-pay

## 2021-01-13 VITALS — BP 138/72 | HR 82 | Temp 97.9°F | Ht 62.0 in | Wt 100.6 lb

## 2021-01-13 DIAGNOSIS — E7800 Pure hypercholesterolemia, unspecified: Secondary | ICD-10-CM

## 2021-01-13 DIAGNOSIS — I340 Nonrheumatic mitral (valve) insufficiency: Secondary | ICD-10-CM

## 2021-01-13 DIAGNOSIS — I6523 Occlusion and stenosis of bilateral carotid arteries: Secondary | ICD-10-CM

## 2021-01-13 MED ORDER — EVOLOCUMAB 140 MG/ML ~~LOC~~ SOAJ
140.0000 mg | Freq: Once | SUBCUTANEOUS | Status: AC
  Administered 2021-01-13: 10:00:00 140 mg via SUBCUTANEOUS

## 2021-01-13 NOTE — Progress Notes (Signed)
Primary Physician/Referring:  Joya Gaskins, FNP  Patient ID: [name], female    DOB: Apr 13, 1940, 80 y.o.   MRN: [id number]  Chief Complaint  Patient presents with   Hyperlipidemia   Follow-up   HPI:    Floyd Lusignan  is a 80 y.o. Caucasian female patient with paroxysmal atrial fibrillation on chronic Eliquis therapy, hypertension, hyperlipidemia, chronic back pain and lumbar stenosis with radiculopathy, ongoing tobacco use disorder with COPD (with chronic dyspnea on exertion and wheezing) with reactive airway disease originally referred for evaluation and management of paroxysmal atrial fibrillation and to establish care.  She has moved from [place] to [place] to be closer to her daughter in February 2022.  Diagnosed with atrial fibrillation in approximately 2018.  Patient presents for 40-monthfollow-up.  Since last office visit patient's lipids remain uncontrolled, he was therefore advised to start RSchubert but she has not yet started this and she would like our office to teach her how to use it, we will do so today so the patient may start.  She is otherwise feeling well without specific complaints today.  She does have chronic dyspnea which remained stable due to underlying COPD.  She is tolerating anticoagulation without bleeding diathesis.  Patient continues to tobacco cessation, presently smoking 6 cigarettes/day.  Denies chest pain, palpitations, syncope, near syncope, dizziness, orthopnea, PND, leg swelling.    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   Depression 10/2013   chronic recurrent major depressive disorder   H/O vitamin D deficiency 08/04/2008   Recurrent dislocation of hip, right    Past Surgical History:  Procedure Laterality Date   JOINT REPLACEMENT     Right hip, 2015    LAPAROSCOPY  06/03/2016   Duodenal ulcer repair   Family History  Problem Relation Age of Onset   Heart disease Mother    Alcohol abuse Mother    Colon cancer Sister    Breast cancer Sister    Heart attack Brother    Cancer Neg Hx    Diabetes Neg Hx     Social History   Tobacco Use   Smoking status: Every Day    Types: Cigarettes   Smokeless tobacco: Never   Tobacco comments:    trying to quit, wearing nicotine patch. Cautioned against smoking and wearing patch. Smokes when patch is taken off.  Substance Use Topics   Alcohol use: Yes    Comment: OCC   Marital Status: Married  ROS  Review of Systems  Constitutional: Negative for malaise/fatigue and weight gain.  Cardiovascular:  Positive for dyspnea on exertion (stable). Negative for chest pain, claudication, leg swelling, near-syncope, orthopnea, palpitations, paroxysmal nocturnal dyspnea and syncope.  Respiratory:  Positive for cough and wheezing (chronic). Negative for shortness of breath.   Musculoskeletal:  Positive for arthritis (right hip).  Psychiatric/Behavioral:  The patient is nervous/anxious.   Objective  Blood pressure 138/72, pulse 82, temperature 97.9 F (36.6 C), height 5' 2"  (1.575 m), weight 100 lb 9.6 oz (45.6 kg), SpO2 98 %. Body mass index is 18.4 kg/m.  Vitals with BMI 01/13/2021 01/13/2021 09/30/2020  Height - 5' 2"  5' 2"   Weight - 100 lbs 10 oz 102 lbs 13 oz  BMI - 154.5162.5 Systolic [id number] Diastolic 72 66 68  Pulse 82 82 72     Physical Exam Vitals reviewed.  Constitutional:      Appearance: She is underweight. She is not ill-appearing.  Neck:     Vascular: Carotid bruit (bilateral) present. No JVD.  Cardiovascular:     Rate and Rhythm: Normal rate and regular rhythm.     Pulses:          Femoral pulses are 1+ on the right side and 2+ on the left side.      Popliteal pulses are 0 on the right side and 2+ on the left side.       Dorsalis pedis pulses are 0 on the right side and 0 on  the left side.       Posterior tibial pulses are 0 on the right side and 0 on the left side.     Heart sounds: Murmur heard.  Harsh midsystolic murmur is present with a grade of 3/6 at the upper right sternal border and apex radiating to the neck.    No gallop.  Pulmonary:     Effort: Pulmonary effort is normal.     Breath sounds: Wheezing (bilateral diffuse) and rales (bilateral diffuse) present.  Musculoskeletal:        General: No swelling.   Laboratory examination:   CMP Latest Ref Rng & Units 09/13/2020 09/13/2020 08/16/2016  Glucose 70 - 99 mg/dL 222(H) 146(H) 105(H)  BUN 8 - 23 mg/dL 16 15 12   Creatinine 0.44 - 1.00 mg/dL 0.75 0.78 0.72  Sodium 135 - 145 mmol/L 137 137 139  Potassium 3.5 - 5.1 mmol/L 3.5 3.7 3.7  Chloride 98 - 111 mmol/L 104 103 100  CO2 22 - 32 mmol/L 25 27 31   Calcium 8.9 - 10.3 mg/dL 8.6(L) 8.7(L) 8.7  Total Protein 6.5 - 8.1 g/dL 6.0(L) - -  Total Bilirubin 0.3 - 1.2 mg/dL 0.7 - -  Alkaline Phos 38 - 126 U/L 49 - -  AST 15 - 41 U/L 22 - -  ALT 0 - 44 U/L 13 - -   CBC Latest Ref Rng & Units 09/13/2020 09/13/2020 08/16/2016  WBC 4.0 - 10.5 K/uL 16.0(H) 12.9(H) 7.2  Hemoglobin 12.0 - 15.0 g/dL 10.1(L) 9.9(L) 10.3(L)  Hematocrit 36.0 - 46.0 % 30.7(L) 30.7(L) 31.9(L)  Platelets 150 - 400 K/uL 143(L) 141(L) 363.0   Lipid Panel     Component Value Date/Time   CHOL 214 (H) 10/30/2020 1043   TRIG 169 (H) 10/30/2020 1043   HDL 58 10/30/2020 1043   LDLCALC 126 (H) 10/30/2020 1043   HEMOGLOBIN A1C No results found for: HGBA1C, MPG TSH No results for input(s): TSH in the last 8760 hours.l External labs:  05/22/2020: Hb 10.8/HCT 32.9, platelets 241, normal indicis.  Iron studies revealed mild iron deficiency anemia. TSH is normal at 3.28. Total cholesterol 227, triglycerides 130, HDL 56, LDL 143.  Non-HDL cholesterol 171. BUN 13, creatinine 0.66, potassium 4.3, EGFR 89 mL, serum glucose 105 mg.  Allergies   Allergies  Allergen Reactions   Bactrim  [Sulfamethoxazole-Trimethoprim] Diarrhea and Nausea And Vomiting   Bee Venom Anaphylaxis    Medications Prior to Visit:   Outpatient Medications Prior to Visit  Medication Sig Dispense Refill   Acetaminophen-Codeine 300-30 MG tablet TAKE 1 TABLET BY MOUTH EVERY 6-8 HOURS AS NEEDED FOR PAIN     albuterol (VENTOLIN HFA) 108 (90 Base) MCG/ACT inhaler Inhale 1 puff into the lungs every 6 (six) hours as needed for wheezing or shortness of breath.  apixaban (ELIQUIS) 2.5 MG TABS tablet Take 2.5 mg by mouth 2 (two) times daily.     atorvastatin (LIPITOR) 40 MG tablet Take 1 tablet (40 mg total) by mouth at bedtime. 90 tablet 3   b complex vitamins tablet Take 1 tablet by mouth daily.     Bempedoic Acid-Ezetimibe (NEXLIZET) 180-10 MG TABS Take 1 tablet by mouth daily. 30 tablet 3   Cholecalciferol (VITAMIN D3) 2000 units TABS Take 1 tablet by mouth daily.     digoxin (LANOXIN) 0.125 MG tablet TAKE 1 TABLET(0.125 MG) BY MOUTH DAILY 90 tablet 0   DULoxetine (CYMBALTA) 60 MG capsule Take 60 mg by mouth daily.     ferrous sulfate 325 (65 FE) MG EC tablet Take 1 tablet (325 mg total) by mouth daily with breakfast. 90 tablet 1   folic acid (FOLVITE) 1 MG tablet Take 1 mg by mouth daily.     furosemide (LASIX) 20 MG tablet Take 20 mg by mouth daily.     levalbuterol (XOPENEX HFA) 45 MCG/ACT inhaler INHALE 1 PUFF BY MOUTH EVERY 6 HOURS     metoprolol succinate (TOPROL-XL) 25 MG 24 hr tablet Take 1 tablet by mouth daily.     Multiple Vitamin (MULTI-VITAMIN) tablet Take 1 tablet by mouth daily.     nicotine (NICODERM CQ - DOSED IN MG/24 HOURS) 21 mg/24hr patch Place 1 patch (21 mg total) onto the skin daily. 28 patch 0   pantoprazole (PROTONIX) 40 MG tablet Take 40 mg by mouth daily.     pregabalin (LYRICA) 50 MG capsule Take 50 mg by mouth 3 (three) times daily.     Spacer/Aero-Holding Chambers (VORTEX VALVED HOLDING CHAMBER) DEVI by Does not apply route.     thiamine (VITAMIN B-1) 100 MG tablet Take 100  mg by mouth daily.     TRELEGY ELLIPTA 100-62.5-25 MCG/INH AEPB Take 1 puff by mouth daily.     Evolocumab (REPATHA) 140 MG/ML SOSY Inject 1 mL into the skin every 14 (fourteen) days. Med approved  through 05/12/2021 (Patient not taking: Reported on 01/13/2021) 2.1 mL 3   No facility-administered medications prior to visit.   Final Medications at End of Visit    Current Meds  Medication Sig   Acetaminophen-Codeine 300-30 MG tablet TAKE 1 TABLET BY MOUTH EVERY 6-8 HOURS AS NEEDED FOR PAIN   albuterol (VENTOLIN HFA) 108 (90 Base) MCG/ACT inhaler Inhale 1 puff into the lungs every 6 (six) hours as needed for wheezing or shortness of breath.   apixaban (ELIQUIS) 2.5 MG TABS tablet Take 2.5 mg by mouth 2 (two) times daily.   atorvastatin (LIPITOR) 40 MG tablet Take 1 tablet (40 mg total) by mouth at bedtime.   b complex vitamins tablet Take 1 tablet by mouth daily.   Bempedoic Acid-Ezetimibe (NEXLIZET) 180-10 MG TABS Take 1 tablet by mouth daily.   Cholecalciferol (VITAMIN D3) 2000 units TABS Take 1 tablet by mouth daily.   digoxin (LANOXIN) 0.125 MG tablet TAKE 1 TABLET(0.125 MG) BY MOUTH DAILY   DULoxetine (CYMBALTA) 60 MG capsule Take 60 mg by mouth daily.   ferrous sulfate 325 (65 FE) MG EC tablet Take 1 tablet (325 mg total) by mouth daily with breakfast.   folic acid (FOLVITE) 1 MG tablet Take 1 mg by mouth daily.   furosemide (LASIX) 20 MG tablet Take 20 mg by mouth daily.   levalbuterol (XOPENEX HFA) 45 MCG/ACT inhaler INHALE 1 PUFF BY MOUTH EVERY 6 HOURS   metoprolol succinate (TOPROL-XL) 25  MG 24 hr tablet Take 1 tablet by mouth daily.   Multiple Vitamin (MULTI-VITAMIN) tablet Take 1 tablet by mouth daily.   nicotine (NICODERM CQ - DOSED IN MG/24 HOURS) 21 mg/24hr patch Place 1 patch (21 mg total) onto the skin daily.   pantoprazole (PROTONIX) 40 MG tablet Take 40 mg by mouth daily.   pregabalin (LYRICA) 50 MG capsule Take 50 mg by mouth 3 (three) times daily.   Spacer/Aero-Holding  Chambers (VORTEX VALVED HOLDING CHAMBER) DEVI by Does not apply route.   thiamine (VITAMIN B-1) 100 MG tablet Take 100 mg by mouth daily.   TRELEGY ELLIPTA 100-62.5-25 MCG/INH AEPB Take 1 puff by mouth daily.    Medications after today's encounter: Current Outpatient Medications  Medication Instructions   Acetaminophen-Codeine 300-30 MG tablet TAKE 1 TABLET BY MOUTH EVERY 6-8 HOURS AS NEEDED FOR PAIN   albuterol (VENTOLIN HFA) 108 (90 Base) MCG/ACT inhaler 1 puff, Inhalation, Every 6 hours PRN   apixaban (ELIQUIS) 2.5 mg, Oral, 2 times daily   atorvastatin (LIPITOR) 40 mg, Oral, Daily at bedtime   b complex vitamins tablet 1 tablet, Oral, Daily   Bempedoic Acid-Ezetimibe (NEXLIZET) 180-10 MG TABS 1 tablet, Oral, Daily   Cholecalciferol (VITAMIN D3) 2000 units TABS 1 tablet, Oral, Daily   digoxin (LANOXIN) 0.125 MG tablet TAKE 1 TABLET(0.125 MG) BY MOUTH DAILY   DULoxetine (CYMBALTA) 60 mg, Oral, Daily   Evolocumab (REPATHA) 140 MG/ML SOSY 1 mL, Subcutaneous, Every 14 days, Med approved  through 05/12/2021   ferrous sulfate 325 mg, Oral, Daily with breakfast   folic acid (FOLVITE) 1 mg, Oral, Daily   furosemide (LASIX) 20 mg, Oral, Daily   levalbuterol (XOPENEX HFA) 45 MCG/ACT inhaler INHALE 1 PUFF BY MOUTH EVERY 6 HOURS   metoprolol succinate (TOPROL-XL) 25 MG 24 hr tablet 1 tablet, Oral, Daily   Multiple Vitamin (MULTI-VITAMIN) tablet 1 tablet, Oral, Daily   nicotine (NICODERM CQ - DOSED IN MG/24 HOURS) 21 mg, Transdermal, Daily   pantoprazole (PROTONIX) 40 mg, Oral, Daily   pregabalin (LYRICA) 50 mg, Oral, 3 times daily   Spacer/Aero-Holding Chambers (VORTEX VALVED HOLDING CHAMBER) DEVI Does not apply   thiamine (VITAMIN B-1) 100 mg, Oral, Daily   TRELEGY ELLIPTA 100-62.5-25 MCG/INH AEPB 1 puff, Oral, Daily    Radiology:   Chest x-ray PA and lateral view 06/11/2019: Cardiomegaly with atherosclerotic changes of the aorta. New small left pleural effusion. COPD changes with fine  reticular prominence and hyperinflation.  Cardiac Studies:   Echocardiogram 08/22/2016: - Left ventricle: The cavity size was normal. There was severe  concentric hypertrophy. Cordal SAM is noted. Systolic function   was vigorous. The estimated ejection fraction was in the range of  65% to 70%. There was dynamic obstruction during Valsalvain the mid cavity, with mid-cavity obliteration, a peak velocity of 421 cm/sec, and a peak gradient of 71 mm Hg. Doppler parameters are consistent with abnormal left ventricular relaxation (grade 1  diastolic dysfunction). The E/e&' ratio is >15, suggesting  elevated LV filling pressure. - Mitral valve: Mildly thickened leaflets. Cordal SAM is noted.   There was trivial regurgitation. - Left atrium: The atrium was normal in size. - Inferior vena cava: The vessel was normal in size. The   respirophasic diameter changes were in the normal range (>= 50%), consistent with normal central venous pressure.   Impressions: - LVEF 65-70%, severe concentric LVH with cordal SAM, dynamic   mid-cavitary gradient of 41 mmHg at rest that increases to 71  mmHg with valsalva.  PCV MYOCARDIAL PERFUSION WITH LEXISCAN 06/22/2020 Lexiscan nuclear stress test performed using 1-day protocol. Normal myocardial perfusion. Stress LVEF calculated 44%, but visually appears normal. Low risk study.   PCV ECHOCARDIOGRAM COMPLETE 06/17/2020 Left ventricle cavity is normal in size. Severe concentric hypertrophy of the left ventricle. Normal global wall motion. Normal LV systolic function with EF 58%. Left atrial cavity is severely dilated. Trileaflet aortic valve.  Mild (Grade I) aortic regurgitation. LVOT max gradient 14 mmHg likely due to LVOT obstruction and not valvular aortic stenosis. No obvious SAM. Mildly restricted mitral valve leaflets. Moderate to severe mitral regurgitation. No evidence of pulmonary hypertension. 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 Previous echocardiogram report in 2018 noted only trivial mitral regurgitation. No other significant change noted.  Carotid artery duplex 12/30/2020:  Duplex suggests stenosis in the right internal carotid artery (16-49%).  Duplex suggests stenosis in the left internal carotid artery (50-69%), upper end of spectrum.  Antegrade right vertebral artery flow. Antegrade left vertebral artery flow.  Compared to the study done on 06/17/2020, there is regression of stenosis in bilateral ICA. Follow up in six months is appropriate if clinically indicated.  EKG:  09/30/2020:probable sinus rhythm (cannot exclude ectopic atrial rhythm) at a rate of 72 bpm.  Normal axis.  LVH with secondary repolarization abnormality, cannot exclude lateral ischemia.  08/13/2020: Normal sinus rhythm at rate of 93 bpm, left atrial enlargement, normal axis.  Anteroseptal infarct old.  LVH with repolarization abnormality, cannot exclude lateral ischemia.  No significant change from 06/04/2020.     Assessment     ICD-10-CM   1. Asymptomatic bilateral carotid artery stenosis  I65.23 PCV ECHOCARDIOGRAM COMPLETE    Lipid Panel With LDL/HDL Ratio    Evolocumab SOAJ 140 mg    2. Hypercholesteremia  E78.00 PCV ECHOCARDIOGRAM COMPLETE    Lipid Panel With LDL/HDL Ratio    Evolocumab SOAJ 140 mg    3. Mitral valve insufficiency, unspecified etiology  I34.0 PCV CAROTID DUPLEX (BILATERAL)      CHA2DS2-VASc Score is 5.  Yearly risk of stroke: 7.2% (F, A, HTN, Vasc Dz).  Score of 1=0.6; 2=2.2; 3=3.2; 4=4.8; 5=7.2; 6=9.8; 7=>9.8) -(CHF; HTN; vasc disease DM,  Female = 1; Age <65 =0; 65-74 = 1,  >75 =2; stroke/embolism= 2).    Meds ordered this encounter  Medications   Evolocumab SOAJ 140 mg    Orders Placed This Encounter  Procedures   Lipid Panel With LDL/HDL Ratio   PCV ECHOCARDIOGRAM COMPLETE    Standing Status:   Future    Standing Expiration Date:   01/13/2022   Recommendations:   Kalayla Shadden is a 80  y.o. Caucasian female with paroxysmal atrial fibrillation on chronic Eliquis therapy, hypertension, hyperlipidemia, chronic back pain and lumbar stenosis with radiculopathy, ongoing tobacco use disorder with COPD with reactive airway disease referred to me for evaluation and management of paroxysmal atrial fibrillation and to establish care.  She has moved from [place] to [place] to be closer to her daughter in February 2022.  Patient presents for 53-monthfollow-up.  She has had no known recurrence of atrial fibrillation and she is without clinical evidence of heart failure at this time.  Unfortunately she has not yet started Repatha, however she was given her first dose in the office today.  We will plan to repeat lipid profile testing prior to next office visit.  Again spent 5 minutes of today's office visit counseling patient regarding smoking cessation.  Patient's dyspnea remains stable,  however given underlying mitral regurgitation will obtain repeat echocardiogram.  Reviewed and discussed carotid artery duplex, will repeat surveillance scan in 6 months.  Blood pressure is relatively well controlled.  Advised patient to monitor at home and notify our office if it is elevated >140/90 mmHg.  Follow-up in 6 months, sooner if needed.    Alethia Berthold, PA-C 01/13/2021, 1:08 PM Office: [phone]

## 2021-02-02 ENCOUNTER — Other Ambulatory Visit: Payer: Self-pay | Admitting: Student

## 2021-02-09 ENCOUNTER — Other Ambulatory Visit: Payer: Self-pay

## 2021-02-09 ENCOUNTER — Other Ambulatory Visit: Payer: Self-pay | Admitting: Urology

## 2021-02-09 ENCOUNTER — Encounter: Payer: Self-pay | Admitting: Student

## 2021-02-09 MED ORDER — APIXABAN 2.5 MG PO TABS: 2.5000 mg | ORAL_TABLET | Freq: Two times a day (BID) | ORAL | 1 refills | Status: DC

## 2021-02-10 NOTE — Progress Notes (Signed)
Pt. Need orders for upcoming surgery.

## 2021-02-10 NOTE — Patient Instructions (Signed)
DUE TO COVID-19 ONLY ONE VISITOR IS ALLOWED TO COME WITH YOU AND STAY IN THE WAITING ROOM ONLY DURING PRE OP AND PROCEDURE.   **NO VISITORS ARE ALLOWED IN THE SHORT STAY AREA OR RECOVERY ROOM!!**  IF YOU WILL BE ADMITTED INTO THE HOSPITAL YOU ARE ALLOWED ONLY TWO SUPPORT PEOPLE DURING VISITATION HOURS ONLY (7 AM -8PM)   The support person(s) must pass our screening, gel in and out, and wear a mask at all times, including in the patients room. Patients must also wear a mask when staff or their support person are in the room. Visitors GUEST BADGE MUST BE WORN VISIBLY  One adult visitor may remain with you overnight and MUST be in the room by 8 P.M.  No visitors under the age of 13. Any visitor under the age of 64 must be accompanied by an adult.        Your procedure is scheduled on: 02/17/21   Report to [facility] Main Entrance    Report to admitting at: 1:15 PM   Call this number if you have problems the morning of surgery [phone]   Do not eat food :After Midnight.   May have liquids until : 12:30 PM   day of surgery  CLEAR LIQUID DIET  Foods Allowed                                                                     Foods Excluded  Water, Black Coffee and tea, regular and decaf                             liquids that you cannot  Plain Jell-O in any flavor  (No red)                                           see through such as: Fruit ices (not with fruit pulp)                                     milk, soups, orange juice              Iced Popsicles (No red)                                    All solid food                                   Apple juices Sports drinks like Gatorade (No red) Lightly seasoned clear broth or consume(fat free) Sugar Sample Menu Breakfast                                Lunch  Supper Cranberry juice                    Beef broth                            Chicken broth Jell-O                                      Grape juice                           Apple juice Coffee or tea                        Jell-O                                      Popsicle                                                Coffee or tea                        Coffee or tea     Oral Hygiene is also important to reduce your risk of infection.                                    Remember - BRUSH YOUR TEETH THE MORNING OF SURGERY WITH YOUR REGULAR TOOTHPASTE   Do NOT smoke after Midnight   Take these medicines the morning of surgery with A SIP OF WATER: pregabalin,duloxetine,metoprolol,diltiazem,digoxin,pantoprazole.  DO NOT TAKE ANY ORAL DIABETIC MEDICATIONS DAY OF YOUR SURGERY                              You may not have any metal on your body including hair pins, jewelry, and body piercing             Do not wear make-up, lotions, powders, perfumes/cologne, or deodorant  Do not wear nail polish including gel and S&S, artificial/acrylic nails, or any other type of covering on natural nails including finger and toenails. If you have artificial nails, gel coating, etc. that needs to be removed by a nail salon please have this removed prior to surgery or surgery may need to be canceled/ delayed if the surgeon/ anesthesia feels like they are unable to be safely monitored.   Do not shave  48 hours prior to surgery.    Do not bring valuables to the hospital. [facility].   Contacts, dentures or bridgework may not be worn into surgery.   Bring small overnight bag day of surgery.    Patients discharged on the day of surgery will not be allowed to drive home.  Someone needs to stay with you for the first 24 hours after anesthesia.   Special Instructions: Bring a copy of your healthcare power  of attorney and living will documents         the day of surgery if you haven't scanned them before.              Please read over the following fact sheets you were given: IF YOU HAVE  QUESTIONS ABOUT YOUR PRE-OP INSTRUCTIONS PLEASE CALL [phone]     [facility] - Preparing for Surgery Before surgery, you can play an important role.  Because skin is not sterile, your skin needs to be as free of germs as possible.  You can reduce the number of germs on your skin by washing with CHG (chlorahexidine gluconate) soap before surgery.  CHG is an antiseptic cleaner which kills germs and bonds with the skin to continue killing germs even after washing. Please DO NOT use if you have an allergy to CHG or antibacterial soaps.  If your skin becomes reddened/irritated stop using the CHG and inform your nurse when you arrive at Short Stay. Do not shave (including legs and underarms) for at least 48 hours prior to the first CHG shower.  You may shave your face/neck. Please follow these instructions carefully:  1.  Shower with CHG Soap the night before surgery and the  morning of Surgery.  2.  If you choose to wash your hair, wash your hair first as usual with your  normal  shampoo.  3.  After you shampoo, rinse your hair and body thoroughly to remove the  shampoo.                           4.  Use CHG as you would any other liquid soap.  You can apply chg directly  to the skin and wash                       Gently with a scrungie or clean washcloth.  5.  Apply the CHG Soap to your body ONLY FROM THE NECK DOWN.   Do not use on face/ open                           Wound or open sores. Avoid contact with eyes, ears mouth and genitals (private parts).                       Wash face,  Genitals (private parts) with your normal soap.             6.  Wash thoroughly, paying special attention to the area where your surgery  will be performed.  7.  Thoroughly rinse your body with warm water from the neck down.  8.  DO NOT shower/wash with your normal soap after using and rinsing off  the CHG Soap.                9.  Pat yourself dry with a clean towel.            10.  Wear clean pajamas.             11.  Place clean sheets on your bed the night of your first shower and do not  sleep with pets. Day of Surgery : Do not apply any lotions/deodorants the morning of surgery.  Please wear clean clothes to the hospital/surgery center.  FAILURE TO FOLLOW THESE INSTRUCTIONS MAY RESULT IN THE CANCELLATION OF YOUR SURGERY PATIENT SIGNATURE_________________________________  NURSE SIGNATURE__________________________________  ________________________________________________________________________

## 2021-02-12 ENCOUNTER — Encounter (HOSPITAL_COMMUNITY)
Admission: RE | Admit: 2021-02-12 | Discharge: 2021-02-12 | Disposition: A | Discharge status: Home / Self Care | Payer: Medicare Other | Source: Ambulatory Visit | Attending: Urology | Admitting: Urology

## 2021-02-12 ENCOUNTER — Encounter (HOSPITAL_COMMUNITY): Payer: Self-pay

## 2021-02-12 ENCOUNTER — Other Ambulatory Visit: Payer: Self-pay

## 2021-02-12 VITALS — BP 136/54 | HR 73 | Temp 97.7°F | Ht 61.0 in | Wt 96.0 lb

## 2021-02-12 DIAGNOSIS — F17210 Nicotine dependence, cigarettes, uncomplicated: Secondary | ICD-10-CM | POA: Insufficient documentation

## 2021-02-12 DIAGNOSIS — Z7901 Long term (current) use of anticoagulants: Secondary | ICD-10-CM | POA: Diagnosis not present

## 2021-02-12 DIAGNOSIS — Z01812 Encounter for preprocedural laboratory examination: Secondary | ICD-10-CM | POA: Insufficient documentation

## 2021-02-12 DIAGNOSIS — J449 Chronic obstructive pulmonary disease, unspecified: Secondary | ICD-10-CM | POA: Diagnosis not present

## 2021-02-12 DIAGNOSIS — E782 Mixed hyperlipidemia: Secondary | ICD-10-CM

## 2021-02-12 DIAGNOSIS — I4891 Unspecified atrial fibrillation: Secondary | ICD-10-CM | POA: Diagnosis not present

## 2021-02-12 DIAGNOSIS — N2889 Other specified disorders of kidney and ureter: Secondary | ICD-10-CM | POA: Diagnosis not present

## 2021-02-12 DIAGNOSIS — I509 Heart failure, unspecified: Secondary | ICD-10-CM | POA: Diagnosis not present

## 2021-02-12 DIAGNOSIS — I340 Nonrheumatic mitral (valve) insufficiency: Secondary | ICD-10-CM | POA: Insufficient documentation

## 2021-02-12 DIAGNOSIS — I110 Hypertensive heart disease with heart failure: Secondary | ICD-10-CM | POA: Insufficient documentation

## 2021-02-12 DIAGNOSIS — I10 Essential (primary) hypertension: Secondary | ICD-10-CM

## 2021-02-12 HISTORY — DX: Hypothyroidism, unspecified: E03.9

## 2021-02-12 HISTORY — DX: Cardiac murmur, unspecified: R01.1

## 2021-02-12 HISTORY — DX: Essential (primary) hypertension: I10

## 2021-02-12 HISTORY — DX: Malignant (primary) neoplasm, unspecified: C80.1

## 2021-02-12 HISTORY — DX: Heart failure, unspecified: I50.9

## 2021-02-12 HISTORY — DX: Pneumonia, unspecified organism: J18.9

## 2021-02-12 HISTORY — DX: Unspecified osteoarthritis, unspecified site: M19.90

## 2021-02-12 HISTORY — DX: Cardiac arrhythmia, unspecified: I49.9

## 2021-02-12 LAB — BASIC METABOLIC PANEL
Anion gap: 8 (ref 5–15)
BUN: 19 mg/dL (ref 8–23)
CO2: 28 mmol/L (ref 22–32)
Calcium: 9.7 mg/dL (ref 8.9–10.3)
Chloride: 101 mmol/L (ref 98–111)
Creatinine, Ser: 1.2 mg/dL — ABNORMAL HIGH (ref 0.44–1.00)
GFR, Estimated: 45 mL/min — ABNORMAL LOW (ref >60)
Glucose, Bld: 122 mg/dL — ABNORMAL HIGH (ref 70–99)
Potassium: 4.5 mmol/L (ref 3.5–5.1)
Sodium: 137 mmol/L (ref 135–145)

## 2021-02-12 LAB — CBC
HCT: 38.2 % (ref 36.0–46.0)
Hemoglobin: 12.6 g/dL (ref 12.0–15.0)
MCH: 31.7 pg (ref 26.0–34.0)
MCHC: 33 g/dL (ref 30.0–36.0)
MCV: 96 fL (ref 80.0–100.0)
Platelets: 185 10*3/uL (ref 150–400)
RBC: 3.98 MIL/uL (ref 3.87–5.11)
RDW: 14 % (ref 11.5–15.5)
WBC: 5.4 10*3/uL (ref 4.0–10.5)
nRBC: 0 % (ref 0.0–0.2)

## 2021-02-12 NOTE — Progress Notes (Signed)
Lab. Results: WBC: 16 HGB: 10.1 HCT: 30.7

## 2021-02-12 NOTE — Progress Notes (Addendum)
COVID Vaccine Completed: Yes Date COVID Vaccine completed: 02/11/20 x3 COVID vaccine manufacturer: 1 -[employer]    2-Moderna    COVID Test: N/A PCP - Oren Binet: FNP Cardiologist - Dr. Adrian Prows  Chest x-ray - 09/13/20 EKG - 09/30/20 Stress Test -  ECHO - 01/13/21 Cardiac Cath -  Pacemaker/ICD device last checked:  Sleep Study -  CPAP -   Fasting Blood Sugar -  Checks Blood Sugar _____ times a day  Blood Thinner Instructions: Aspirin Instructions: Last Dose:  Anesthesia review: Hx: HTN,COPD,Afib,CHF,smoker  Patient denies shortness of breath, fever, cough and chest pain at PAT appointment   Patient verbalized understanding of instructions that were given to them at the PAT appointment. Patient was also instructed that they will need to review over the PAT instructions again at home before surgery.

## 2021-02-15 NOTE — Progress Notes (Signed)
Anesthesia Chart Review   Case: [id number] Date/Time: 02/17/21 1515   Procedures:      CYSTOSCOPY WITH BILATERAL RETROGRADE PYELOGRAM, LEFT URETEROSCOPY , BIOPSY AND POSSIBLE STENT PLACEMENT (Bilateral)     HOLMIUM LASER APPLICATION (Left)   Anesthesia type: General   Pre-op diagnosis: LEFT URETERAL MASS   Location: [facility] ROOM 03 / [facility]   Surgeons: Alexis Frock, MD       DISCUSSION:81 y.o. every day smoker with h/o COPD, CHF, moderate to severe mitral regurgitation, a-fib (Eliquis), left ureteral mass scheduled for above procedure 02/17/21 with Dr. Alexis Frock.   Pt last seen by cardiology 01/13/2021, stable at this visit.  6 month follow up recommended.   Pt advised to hold Eliquis 02/15/2021.   Anticipate pt can proceed with planned procedure barring acute status change.   VS: BP (!) 136/54    Pulse 73    Temp 36.5 C (Oral)    Ht 5\' 1"  (1.549 m)    Wt 43.5 kg    SpO2 96%    BMI 18.14 kg/m   PROVIDERS: Joya Gaskins, FNP is PCP   Adrian Prows, MD is Cardiologist  LABS: Labs reviewed: Acceptable for surgery. (all labs ordered are listed, but only abnormal results are displayed)  Labs Reviewed  BASIC METABOLIC PANEL - Abnormal; Notable for the following components:      Result Value   Glucose, Bld 122 (*)    Creatinine, Ser 1.20 (*)    GFR, Estimated 45 (*)    All other components within normal limits  CBC     IMAGES:   EKG: 09/30/2020 Atrial  Rhythm  P:QRS - 1:1, Abnormal P axis, H Rate 72 Possible left ventricular hypertrophy on non-voltage basis.   -Poor R-wave progression -may be secondary to left ventricular hypertrophy   consider old anterior infarct.   -  T-abnormality  -Possible  Inferior and lateral  ischemia.   CV: Lexiscan Tetrofosmin stress test 06/22/2020: Lexiscan nuclear stress test performed using 1-day protocol. Normal myocardial perfusion. Stress LVEF calculated 44%, but visually appears normal. Low risk study.  Echocardiogram 06/17/2020:   Left ventricle cavity is normal in size. Severe concentric hypertrophy of  the left ventricle. Normal global wall motion. Normal LV systolic function  with EF 58%.  Left atrial cavity is severely dilated.  Trileaflet aortic valve.  Mild (Grade I) aortic regurgitation. LVOT max  gradient 14 mmHg likely due to LVOT obstruction and not valvular aortic  stenosis. No obvious SAM.  Mildly restricted mitral valve leaflets. Moderate to severe mitral  regurgitation.  No evidence of pulmonary hypertension.  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  Previous echocardiogram report in 2018 noted only trivial mitral  regurgitation. No other significant change noted.   Past Medical History:  Diagnosis Date   Anemia    Anxiety    Arthritis    B12 deficiency 05/2015   B12 was 184   Cancer (HCC)    CHF (congestive heart failure) (HCC)    COPD (chronic obstructive pulmonary disease) ([place])    Crohn's disease, small intestine ([place]) 02/19/2009   Depression 10/2013   chronic recurrent major depressive disorder   Dysrhythmia    H/O vitamin D deficiency 08/04/2008   Heart murmur    Hypertension    Hypothyroidism    Pneumonia    Recurrent dislocation of hip, right     Past Surgical History:  Procedure Laterality Date   JOINT REPLACEMENT     Right hip, 2015  LAPAROSCOPY  06/03/2016   Duodenal ulcer repair   TONSILLECTOMY     TUBAL LIGATION      MEDICATIONS:  albuterol (VENTOLIN HFA) 108 (90 Base) MCG/ACT inhaler   apixaban (ELIQUIS) 2.5 MG TABS tablet   atorvastatin (LIPITOR) 40 MG tablet   b complex vitamins tablet   Bempedoic Acid-Ezetimibe (NEXLIZET) 180-10 MG TABS   budesonide-formoterol (SYMBICORT) 160-4.5 MCG/ACT inhaler   digoxin (LANOXIN) 0.125 MG tablet   diltiazem (CARDIZEM CD) 180 MG 24 hr capsule   DULoxetine (CYMBALTA) 60 MG capsule   EPINEPHrine 0.3 mg/0.3 mL IJ SOAJ injection   ferrous sulfate 325 (65 FE) MG EC tablet   folic acid (FOLVITE) 1  MG tablet   furosemide (LASIX) 20 MG tablet   metoprolol succinate (TOPROL-XL) 25 MG 24 hr tablet   nicotine (NICODERM CQ - DOSED IN MG/24 HOURS) 21 mg/24hr patch   pantoprazole (PROTONIX) 40 MG tablet   pregabalin (LYRICA) 50 MG capsule   REPATHA 140 MG/ML SOSY   Spacer/Aero-Holding Chambers (VORTEX VALVED HOLDING CHAMBER) DEVI   tiotropium (SPIRIVA) 18 MCG inhalation capsule   TRELEGY ELLIPTA 100-62.5-25 MCG/INH AEPB   No current facility-administered medications for this encounter.     Konrad Felix Ward, PA-C [facility] [phone]

## 2021-02-17 ENCOUNTER — Ambulatory Visit (HOSPITAL_COMMUNITY): Payer: Medicare Other | Admitting: Physician Assistant

## 2021-02-17 ENCOUNTER — Encounter (HOSPITAL_COMMUNITY)
Admission: RE | Disposition: A | Discharge status: Home Health | Payer: Self-pay | Source: Ambulatory Visit | Attending: Urology

## 2021-02-17 ENCOUNTER — Encounter (HOSPITAL_COMMUNITY): Payer: Self-pay | Admitting: Urology

## 2021-02-17 ENCOUNTER — Inpatient Hospital Stay (HOSPITAL_COMMUNITY)
Admission: RE | Admit: 2021-02-17 | Discharge: 2021-02-21 | DRG: 656 | Disposition: A | Discharge status: Home Health | Payer: Medicare Other | Source: Ambulatory Visit | Attending: Urology | Admitting: Urology

## 2021-02-17 ENCOUNTER — Ambulatory Visit (HOSPITAL_COMMUNITY): Payer: Medicare Other | Admitting: Anesthesiology

## 2021-02-17 ENCOUNTER — Ambulatory Visit (HOSPITAL_COMMUNITY): Payer: Medicare Other

## 2021-02-17 DIAGNOSIS — N2889 Other specified disorders of kidney and ureter: Secondary | ICD-10-CM

## 2021-02-17 DIAGNOSIS — N179 Acute kidney failure, unspecified: Secondary | ICD-10-CM | POA: Diagnosis present

## 2021-02-17 DIAGNOSIS — R0600 Dyspnea, unspecified: Secondary | ICD-10-CM

## 2021-02-17 DIAGNOSIS — N1831 Chronic kidney disease, stage 3a: Secondary | ICD-10-CM | POA: Diagnosis present

## 2021-02-17 DIAGNOSIS — Z888 Allergy status to other drugs, medicaments and biological substances status: Secondary | ICD-10-CM

## 2021-02-17 DIAGNOSIS — Z8249 Family history of ischemic heart disease and other diseases of the circulatory system: Secondary | ICD-10-CM

## 2021-02-17 DIAGNOSIS — N1330 Unspecified hydronephrosis: Secondary | ICD-10-CM | POA: Diagnosis present

## 2021-02-17 DIAGNOSIS — I272 Pulmonary hypertension, unspecified: Secondary | ICD-10-CM | POA: Diagnosis present

## 2021-02-17 DIAGNOSIS — J431 Panlobular emphysema: Secondary | ICD-10-CM | POA: Diagnosis present

## 2021-02-17 DIAGNOSIS — D638 Anemia in other chronic diseases classified elsewhere: Secondary | ICD-10-CM | POA: Diagnosis present

## 2021-02-17 DIAGNOSIS — E876 Hypokalemia: Secondary | ICD-10-CM | POA: Diagnosis present

## 2021-02-17 DIAGNOSIS — E039 Hypothyroidism, unspecified: Secondary | ICD-10-CM | POA: Diagnosis present

## 2021-02-17 DIAGNOSIS — R011 Cardiac murmur, unspecified: Secondary | ICD-10-CM | POA: Diagnosis present

## 2021-02-17 DIAGNOSIS — Z20822 Contact with and (suspected) exposure to covid-19: Secondary | ICD-10-CM | POA: Diagnosis present

## 2021-02-17 DIAGNOSIS — I130 Hypertensive heart and chronic kidney disease with heart failure and stage 1 through stage 4 chronic kidney disease, or unspecified chronic kidney disease: Secondary | ICD-10-CM | POA: Diagnosis present

## 2021-02-17 DIAGNOSIS — F17210 Nicotine dependence, cigarettes, uncomplicated: Secondary | ICD-10-CM | POA: Diagnosis present

## 2021-02-17 DIAGNOSIS — I509 Heart failure, unspecified: Secondary | ICD-10-CM

## 2021-02-17 DIAGNOSIS — Z7901 Long term (current) use of anticoagulants: Secondary | ICD-10-CM

## 2021-02-17 DIAGNOSIS — K5090 Crohn's disease, unspecified, without complications: Secondary | ICD-10-CM | POA: Diagnosis present

## 2021-02-17 DIAGNOSIS — Z811 Family history of alcohol abuse and dependence: Secondary | ICD-10-CM

## 2021-02-17 DIAGNOSIS — K219 Gastro-esophageal reflux disease without esophagitis: Secondary | ICD-10-CM | POA: Diagnosis present

## 2021-02-17 DIAGNOSIS — J441 Chronic obstructive pulmonary disease with (acute) exacerbation: Secondary | ICD-10-CM

## 2021-02-17 DIAGNOSIS — C689 Malignant neoplasm of urinary organ, unspecified: Principal | ICD-10-CM | POA: Diagnosis present

## 2021-02-17 DIAGNOSIS — J449 Chronic obstructive pulmonary disease, unspecified: Secondary | ICD-10-CM | POA: Diagnosis present

## 2021-02-17 DIAGNOSIS — I5033 Acute on chronic diastolic (congestive) heart failure: Secondary | ICD-10-CM

## 2021-02-17 DIAGNOSIS — Z96641 Presence of right artificial hip joint: Secondary | ICD-10-CM | POA: Diagnosis present

## 2021-02-17 DIAGNOSIS — J189 Pneumonia, unspecified organism: Secondary | ICD-10-CM | POA: Diagnosis present

## 2021-02-17 DIAGNOSIS — I951 Orthostatic hypotension: Secondary | ICD-10-CM | POA: Diagnosis not present

## 2021-02-17 DIAGNOSIS — Z800 Family history of malignant neoplasm of digestive organs: Secondary | ICD-10-CM

## 2021-02-17 DIAGNOSIS — Z803 Family history of malignant neoplasm of breast: Secondary | ICD-10-CM

## 2021-02-17 DIAGNOSIS — Z91030 Bee allergy status: Secondary | ICD-10-CM

## 2021-02-17 DIAGNOSIS — J9601 Acute respiratory failure with hypoxia: Secondary | ICD-10-CM | POA: Diagnosis present

## 2021-02-17 DIAGNOSIS — E785 Hyperlipidemia, unspecified: Secondary | ICD-10-CM | POA: Diagnosis present

## 2021-02-17 DIAGNOSIS — I517 Cardiomegaly: Secondary | ICD-10-CM | POA: Diagnosis present

## 2021-02-17 DIAGNOSIS — I10 Essential (primary) hypertension: Secondary | ICD-10-CM | POA: Diagnosis present

## 2021-02-17 DIAGNOSIS — I480 Paroxysmal atrial fibrillation: Secondary | ICD-10-CM | POA: Diagnosis present

## 2021-02-17 HISTORY — PX: CYSTOSCOPY WITH RETROGRADE PYELOGRAM, URETEROSCOPY AND STENT PLACEMENT: SHX5789

## 2021-02-17 HISTORY — PX: HOLMIUM LASER APPLICATION: SHX5852

## 2021-02-17 SURGERY — CYSTOURETEROSCOPY, WITH RETROGRADE PYELOGRAM AND STENT INSERTION
Anesthesia: General | Site: Ureter | Laterality: Left

## 2021-02-17 MED ORDER — ATROPINE SULFATE 1 MG/ML IV SOLN
0.2000 mg | Freq: Once | INTRAVENOUS | Status: AC
  Administered 2021-02-17: 0.2 mg via INTRAVENOUS

## 2021-02-17 MED ORDER — LIDOCAINE 2% (20 MG/ML) 5 ML SYRINGE
INTRAMUSCULAR | Status: DC | PRN
  Administered 2021-02-17: 40 mg via INTRAVENOUS

## 2021-02-17 MED ORDER — ATROPINE SULFATE 1 MG/ML IV SOLN
INTRAVENOUS | Status: AC
  Filled 2021-02-17: qty 1

## 2021-02-17 MED ORDER — PANTOPRAZOLE SODIUM 40 MG PO TBEC
40.0000 mg | DELAYED_RELEASE_TABLET | Freq: Every day | ORAL | Status: DC
  Administered 2021-02-18 – 2021-02-21 (×4): 40 mg via ORAL
  Filled 2021-02-17 (×5): qty 1

## 2021-02-17 MED ORDER — SODIUM CHLORIDE 0.9 % IR SOLN
Status: DC | PRN
  Administered 2021-02-17: 3000 mL

## 2021-02-17 MED ORDER — ALBUMIN HUMAN 5 % IV SOLN
INTRAVENOUS | Status: AC
  Filled 2021-02-17: qty 250

## 2021-02-17 MED ORDER — GLYCOPYRROLATE 0.2 MG/ML IJ SOLN
INTRAMUSCULAR | Status: DC | PRN
  Administered 2021-02-17 (×2): .2 mg via INTRAVENOUS

## 2021-02-17 MED ORDER — PROPOFOL 10 MG/ML IV BOLUS
INTRAVENOUS | Status: AC
  Filled 2021-02-17: qty 20

## 2021-02-17 MED ORDER — PREGABALIN 50 MG PO CAPS
50.0000 mg | ORAL_CAPSULE | Freq: Two times a day (BID) | ORAL | Status: DC
  Filled 2021-02-17: qty 1

## 2021-02-17 MED ORDER — DULOXETINE HCL 60 MG PO CPEP
60.0000 mg | ORAL_CAPSULE | Freq: Every day | ORAL | Status: DC
  Administered 2021-02-18 – 2021-02-21 (×4): 60 mg via ORAL
  Filled 2021-02-17 (×5): qty 1

## 2021-02-17 MED ORDER — ALBUMIN HUMAN 5 % IV SOLN
12.5000 g | Freq: Once | INTRAVENOUS | Status: AC
  Administered 2021-02-17: 12.5 g via INTRAVENOUS

## 2021-02-17 MED ORDER — ACETAMINOPHEN 325 MG PO TABS: 650.0000 mg | ORAL_TABLET | ORAL | Status: DC | PRN

## 2021-02-17 MED ORDER — PROPOFOL 10 MG/ML IV BOLUS
INTRAVENOUS | Status: DC | PRN
  Administered 2021-02-17: 70 mg via INTRAVENOUS

## 2021-02-17 MED ORDER — 0.9 % SODIUM CHLORIDE (POUR BTL) OPTIME
TOPICAL | Status: DC | PRN
  Administered 2021-02-17: 1000 mL

## 2021-02-17 MED ORDER — FENTANYL CITRATE (PF) 100 MCG/2ML IJ SOLN
INTRAMUSCULAR | Status: DC | PRN
  Administered 2021-02-17: 50 ug via INTRAVENOUS

## 2021-02-17 MED ORDER — FENTANYL CITRATE PF 50 MCG/ML IJ SOSY: 25.0000 ug | PREFILLED_SYRINGE | INTRAMUSCULAR | Status: DC | PRN

## 2021-02-17 MED ORDER — SODIUM CHLORIDE 0.9 % IV SOLN: INTRAVENOUS | Status: DC

## 2021-02-17 MED ORDER — IOHEXOL 300 MG/ML  SOLN
INTRAMUSCULAR | Status: DC | PRN
  Administered 2021-02-17: 20 mL

## 2021-02-17 MED ORDER — ORAL CARE MOUTH RINSE: 15.0000 mL | Freq: Once | OROMUCOSAL | Status: AC

## 2021-02-17 MED ORDER — DEXAMETHASONE SODIUM PHOSPHATE 10 MG/ML IJ SOLN
INTRAMUSCULAR | Status: DC | PRN
  Administered 2021-02-17: 5 mg via INTRAVENOUS

## 2021-02-17 MED ORDER — NICOTINE 21 MG/24HR TD PT24
21.0000 mg | MEDICATED_PATCH | Freq: Every day | TRANSDERMAL | Status: DC
  Filled 2021-02-17: qty 1

## 2021-02-17 MED ORDER — SENNOSIDES-DOCUSATE SODIUM 8.6-50 MG PO TABS
1.0000 | ORAL_TABLET | Freq: Two times a day (BID) | ORAL | Status: DC
  Administered 2021-02-18 – 2021-02-21 (×8): 1 via ORAL
  Filled 2021-02-17 (×8): qty 1

## 2021-02-17 MED ORDER — GENTAMICIN SULFATE 40 MG/ML IJ SOLN
100.0000 mg | INTRAVENOUS | Status: AC
  Administered 2021-02-17: 100 mg via INTRAVENOUS
  Filled 2021-02-17: qty 2.5

## 2021-02-17 MED ORDER — TRAMADOL HCL 50 MG PO TABS
50.0000 mg | ORAL_TABLET | Freq: Four times a day (QID) | ORAL | Status: DC | PRN
  Administered 2021-02-17 – 2021-02-21 (×6): 50 mg via ORAL
  Filled 2021-02-17 (×6): qty 1

## 2021-02-17 MED ORDER — ATORVASTATIN CALCIUM 40 MG PO TABS
40.0000 mg | ORAL_TABLET | Freq: Every day | ORAL | Status: DC
  Filled 2021-02-17: qty 1

## 2021-02-17 MED ORDER — ALBUTEROL SULFATE (2.5 MG/3ML) 0.083% IN NEBU
2.5000 mg | INHALATION_SOLUTION | Freq: Four times a day (QID) | RESPIRATORY_TRACT | Status: DC | PRN
  Administered 2021-02-18 – 2021-02-21 (×4): 2.5 mg via RESPIRATORY_TRACT
  Filled 2021-02-17 (×3): qty 3

## 2021-02-17 MED ORDER — CHLORHEXIDINE GLUCONATE 0.12 % MT SOLN
15.0000 mL | Freq: Once | OROMUCOSAL | Status: AC
  Administered 2021-02-17: 15 mL via OROMUCOSAL

## 2021-02-17 MED ORDER — DIGOXIN 125 MCG PO TABS
0.1250 mg | ORAL_TABLET | Freq: Every day | ORAL | Status: DC
  Administered 2021-02-18 – 2021-02-21 (×4): 0.125 mg via ORAL
  Filled 2021-02-17 (×5): qty 1

## 2021-02-17 MED ORDER — FENTANYL CITRATE (PF) 100 MCG/2ML IJ SOLN
INTRAMUSCULAR | Status: AC
  Filled 2021-02-17: qty 2

## 2021-02-17 MED ORDER — AMISULPRIDE (ANTIEMETIC) 5 MG/2ML IV SOLN: 5.0000 mg | Freq: Once | INTRAVENOUS | Status: DC | PRN

## 2021-02-17 MED ORDER — ONDANSETRON HCL 4 MG/2ML IJ SOLN
INTRAMUSCULAR | Status: DC | PRN
Start: 2021-02-17 — End: 2021-02-17
  Administered 2021-02-17: 4 mg via INTRAVENOUS

## 2021-02-17 MED ORDER — TRAMADOL HCL 50 MG PO TABS: 50.0000 mg | ORAL_TABLET | Freq: Four times a day (QID) | ORAL | 0 refills | Status: DC | PRN

## 2021-02-17 MED ORDER — TRAMADOL HCL 50 MG PO TABS
ORAL_TABLET | ORAL | Status: AC
  Filled 2021-02-17: qty 1

## 2021-02-17 MED ORDER — ALBUTEROL SULFATE HFA 108 (90 BASE) MCG/ACT IN AERS: 1.0000 | INHALATION_SPRAY | Freq: Four times a day (QID) | RESPIRATORY_TRACT | Status: DC | PRN

## 2021-02-17 MED ORDER — LACTATED RINGERS IV SOLN: INTRAVENOUS | Status: DC

## 2021-02-17 SURGICAL SUPPLY — 23 items
BAG URO CATCHER STRL LF (MISCELLANEOUS) ×3 IMPLANT
BASKET LASER NITINOL 1.9FR (BASKET) IMPLANT
CATH URETL OPEN END 6FR 70 (CATHETERS) ×3 IMPLANT
CLOTH BEACON ORANGE TIMEOUT ST (SAFETY) ×3 IMPLANT
EXTRACTOR STONE 1.7FRX115CM (UROLOGICAL SUPPLIES) ×1 IMPLANT
GLOVE SURG ENC TEXT LTX SZ7.5 (GLOVE) ×3 IMPLANT
GOWN STRL REUS W/TWL LRG LVL3 (GOWN DISPOSABLE) ×3 IMPLANT
GUIDEWIRE ANG ZIPWIRE 038X150 (WIRE) ×3 IMPLANT
GUIDEWIRE STR DUAL SENSOR (WIRE) ×3 IMPLANT
KIT TURNOVER KIT A (KITS) IMPLANT
LASER FIB FLEXIVA PULSE ID 365 (Laser) IMPLANT
LASER FIB FLEXIVA PULSE ID 550 (Laser) IMPLANT
LASER FIB FLEXIVA PULSE ID 910 (Laser) IMPLANT
MANIFOLD NEPTUNE II (INSTRUMENTS) ×3 IMPLANT
PACK CYSTO (CUSTOM PROCEDURE TRAY) ×3 IMPLANT
SHEATH NAVIGATOR HD 11/13X28 (SHEATH) IMPLANT
SHEATH NAVIGATOR HD 11/13X36 (SHEATH) IMPLANT
STENT POLARIS 5FRX26 (STENTS) ×1 IMPLANT
TRACTIP FLEXIVA PULS ID 200XHI (Laser) IMPLANT
TRACTIP FLEXIVA PULSE ID 200 (Laser)
TUBE FEEDING 8FR 16IN STR KANG (MISCELLANEOUS) ×3 IMPLANT
TUBING CONNECTING 10 (TUBING) ×3 IMPLANT
TUBING UROLOGY SET (TUBING) ×3 IMPLANT

## 2021-02-17 NOTE — Brief Op Note (Signed)
02/17/2021  4:38 PM  PATIENT:  Kelly [name]  81 y.o. female  PRE-OPERATIVE DIAGNOSIS:  LEFT URETERAL MASS  POST-OPERATIVE DIAGNOSIS:  left ureteral mass  PROCEDURE:  Procedure(s): CYSTOSCOPY WITH BILATERAL RETROGRADE PYELOGRAM, LEFT URETEROSCOPY , BIOPSY AND POSSIBLE STENT PLACEMENT (Bilateral) HOLMIUM LASER APPLICATION (Left)  SURGEON:  Surgeon(s) and Role:    Alexis Frock, MD - Primary  PHYSICIAN ASSISTANT:   ASSISTANTS: none   ANESTHESIA:   general  EBL:  minimal   BLOOD ADMINISTERED:none  DRAINS: none   LOCAL MEDICATIONS USED:  NONE  SPECIMEN:  Source of Specimen:  left ureteral mass  DISPOSITION OF SPECIMEN:  PATHOLOGY  COUNTS:  YES  TOURNIQUET:  * No tourniquets in log *  DICTATION: .Other Dictation: Dictation Number [id number]  PLAN OF CARE: Discharge to home after PACU  PATIENT DISPOSITION:  PACU - hemodynamically stable.   Delay start of Pharmacological VTE agent (>24hrs) due to surgical blood loss or risk of bleeding: yes

## 2021-02-17 NOTE — Anesthesia Procedure Notes (Signed)
Procedure Name: LMA Insertion Date/Time: 02/17/2021 4:11 PM Performed by: Gean Maidens, CRNA Pre-anesthesia Checklist: Patient identified, Emergency Drugs available, Suction available, Patient being monitored and Timeout performed Patient Re-evaluated:Patient Re-evaluated prior to induction Oxygen Delivery Method: Circle system utilized Preoxygenation: Pre-oxygenation with 100% oxygen Induction Type: IV induction Ventilation: Mask ventilation without difficulty LMA: LMA inserted LMA Size: 3.0 Number of attempts: 1 Placement Confirmation: positive ETCO2 and breath sounds checked- equal and bilateral Tube secured with: Tape Dental Injury: Teeth and Oropharynx as per pre-operative assessment

## 2021-02-17 NOTE — Discharge Instructions (Signed)
1 - You may have urinary urgency (bladder spasms) and bloody urine on / off with stent in place. This is normal. ° °2 - Call MD or go to ER for fever >102, severe pain / nausea / vomiting not relieved by medications, or acute change in medical status ° °

## 2021-02-17 NOTE — Progress Notes (Signed)
S:  POD 0 s/p uncomplicated BX of LEFT ureteral tumor and ureteral stent. Some orthostasis in PACU. She is eldelry and frail at baseline on BP meds and variably compliant with COPD. No CP, SOB.   O: NAD, AOx3,  Symmetric Smile HR 40s (at baseline) NO DOE SNTND Thin extremities w/o edema  A/P:  Observation overnight, hold BP meds and diuretics. Likely mild orthostasis 2/2 sensitivity to anesthesia meds / pain meds / NPO status on BP meds and diuretics.Likely DC tomorrow if can ambulate, [facility] consult if not.

## 2021-02-17 NOTE — Anesthesia Preprocedure Evaluation (Addendum)
Anesthesia Evaluation  Patient identified by MRN, date of birth, ID band Patient awake    Reviewed: Allergy & Precautions, NPO status , Patient's Chart, lab work & pertinent test results  Airway Mallampati: II  TM Distance: >3 FB Neck ROM: Full    Dental no notable dental hx. (+) Partial Upper, Partial Lower, Poor Dentition, Missing   Pulmonary COPD, Current Smoker,    Pulmonary exam normal breath sounds clear to auscultation       Cardiovascular hypertension, Pt. on medications and Pt. on home beta blockers negative cardio ROS Normal cardiovascular exam+ dysrhythmias  Rhythm:Regular Rate:Normal  EKG: 09/30/2020 Atrial  Rhythm  P:QRS - 1:1, Abnormal P axis, H Rate 72 Possible left ventricular hypertrophy on non-voltage basis.   -Poor R-wave progression -may be secondary to left ventricular hypertrophy   consider old anterior infarct.   -  T-abnormality  -Possible  Inferior and lateral  ischemia.   CV: Lexiscan Tetrofosmin stress test 06/22/2020: Lexiscan nuclear stress test performed using 1-day protocol. Normal myocardial perfusion. Stress LVEF calculated 44%, but visually appears normal. Low risk study.  Echocardiogram 06/17/2020:  Left ventricle cavity is normal in size. Severe concentric hypertrophy of  the left ventricle. Normal global wall motion. Normal LV systolic function  with EF 58%.  Left atrial cavity is severely dilated.  Trileaflet aortic valve. Mild (Grade I) aortic regurgitation. LVOT max  gradient 14 mmHg likely due to LVOT obstruction and not valvular aortic  stenosis. No obvious SAM.  Mildly restricted mitral valve leaflets. Moderate to severe mitral  regurgitation.  No evidence of pulmonary hypertension.  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  Previous echocardiogram report in 2018 noted only trivial mitral  regurgitation. No other significant change noted.     Neuro/Psych PSYCHIATRIC DISORDERS Anxiety Depression negative neurological ROS     GI/Hepatic negative GI ROS, (+)     substance abuse  alcohol use,   Endo/Other  Hypothyroidism   Renal/GU negative Renal ROS  negative genitourinary   Musculoskeletal  (+) Arthritis , Osteoarthritis,    Abdominal   Peds negative pediatric ROS (+)  Hematology  (+) Blood dyscrasia, anemia ,   Anesthesia Other Findings   Reproductive/Obstetrics negative OB ROS                            Anesthesia Physical Anesthesia Plan  ASA: 3  Anesthesia Plan: General   Post-op Pain Management: Minimal or no pain anticipated   Induction: Intravenous  PONV Risk Score and Plan: 2 and Ondansetron and Dexamethasone  Airway Management Planned: Oral ETT and LMA  Additional Equipment: None  Intra-op Plan:   Post-operative Plan: Extubation in OR  Informed Consent: I have reviewed the patients History and Physical, chart, labs and discussed the procedure including the risks, benefits and alternatives for the proposed anesthesia with the patient or authorized representative who has indicated his/her understanding and acceptance.     Dental advisory given  Plan Discussed with: CRNA and Anesthesiologist  Anesthesia Plan Comments: (DISCUSSION:81 y.o. every day smoker with h/o COPD, CHF, moderate to severe mitral regurgitation, a-fib (Eliquis), left ureteral mass scheduled for above procedure 02/17/21 with Dr. Alexis Frock.   Pt last seen by cardiology 01/13/2021, stable at this visit.  6 month follow up recommended.   Pt advised to hold Eliquis 02/15/2021.  )       Anesthesia Quick Evaluation

## 2021-02-17 NOTE — H&P (Signed)
Kelly [name] is an 81 y.o. female.    Chief Complaint: Pre-Op LEFT Ureteroscopy / Possible Biopsy / Laser Ablation / Stent  HPI:   1 - Left Distal Ureteral Mass - left distal ureteral mass with mild-mod hyro on CT 12/2020 on eval hematuria. 50PY smoker. She is on eliquus. Cr 0.6.   PMH sig for perf dueodenal ulcer repair (supraumbilical midline scar), BTL, CHF/Eliquus (prevention only, has held,follows Dr. Einar Gip cards), 50PY smoker/still smokes/not limiting. Marland Kitchen Her PCP is Patrecia Pace NP with [facility]. She lives with daughter Kelly [name] and her family.   Today " Kelly [name] " is seen to proceed with LEFT ureteroscopy to further characterize left distal ureteral mass. She has held eliquus as instructed. C19 screen negative, most recent UCX negative.     Past Medical History:  Diagnosis Date   Anemia    Anxiety    Arthritis    B12 deficiency 05/2015   B12 was 184   Cancer (HCC)    CHF (congestive heart failure) (HCC)    COPD (chronic obstructive pulmonary disease) (HCC)    Crohn's disease, small intestine ([place]) 02/19/2009   Depression 10/2013   chronic recurrent major depressive disorder   Dysrhythmia    H/O vitamin D deficiency 08/04/2008   Heart murmur    Hypertension    Hypothyroidism    Pneumonia    Recurrent dislocation of hip, right     Past Surgical History:  Procedure Laterality Date   JOINT REPLACEMENT     Right hip, 2015   LAPAROSCOPY  06/03/2016   Duodenal ulcer repair   TONSILLECTOMY     TUBAL LIGATION      Family History  Problem Relation Age of Onset   Heart disease Mother    Alcohol abuse Mother    Colon cancer Sister    Breast cancer Sister    Heart attack Brother    Cancer Neg Hx    Diabetes Neg Hx    Social History:  reports that she has been smoking cigarettes. She has a 25.00 pack-year smoking history. She has never used smokeless tobacco. She reports current alcohol use. She reports that she does not use drugs.  Allergies:  Allergies   Allergen Reactions   Bactrim [Sulfamethoxazole-Trimethoprim] Diarrhea and Nausea And Vomiting   Bee Venom Anaphylaxis    No medications prior to admission.    No results found for this or any previous visit (from the past 48 hour(s)). No results found.  Review of Systems  Constitutional: Negative.   HENT: Negative.    Eyes: Negative.   Respiratory: Negative.    Cardiovascular: Negative.  Negative for chest pain.  Genitourinary:  Positive for hematuria.  All other systems reviewed and are negative.  There were no vitals taken for this visit. Physical Exam Vitals reviewed.  HENT:     Head: Normocephalic.     Nose: Nose normal.  Eyes:     Pupils: Pupils are equal, round, and reactive to light.  Cardiovascular:     Rate and Rhythm: Normal rate.  Abdominal:     General: Abdomen is flat.     Comments: Stable scars w/o hernias.   Genitourinary:    Comments: No CVAT at present.  Musculoskeletal:        General: Normal range of motion.     Cervical back: Normal range of motion.  Skin:    General: Skin is warm.  Neurological:     General: No focal deficit present.  Mental Status: She is alert.  Psychiatric:        Mood and Affect: Mood normal.     Assessment/Plan  Proceed as planned with cysto, bilateral retrogrades, left ureteroscopy / biopsy / possible laser ablation + stent. Risks, benefits, alternatives, expected peri-op course discussed previously and reiterated today.   Alexis Frock, MD 02/17/2021, 6:37 AM

## 2021-02-17 NOTE — Transfer of Care (Signed)
Immediate Anesthesia Transfer of Care Note  Patient: Sherita Decoste  Procedure(s) Performed: CYSTOSCOPY WITH  RETROGRADE PYELOGRAM, LEFT URETEROSCOPY , BIOPSY AND tumor ablation  STENT PLACEMENT (Left: Ureter) HOLMIUM LASER APPLICATION (Left)  Patient Location: PACU  Anesthesia Type:General  Level of Consciousness: sedated, patient cooperative and responds to stimulation  Airway & Oxygen Therapy: Patient Spontanous Breathing and Patient connected to face mask oxygen  Post-op Assessment: Report given to RN and Post -op Vital signs reviewed and stable  Post vital signs: Reviewed and stable  Last Vitals:  Vitals Value Taken Time  BP    Temp    Pulse 41 02/17/21 1650  Resp 10 02/17/21 1650  SpO2 100 % 02/17/21 1650  Vitals shown include unvalidated device data.  Last Pain:  Vitals:   02/17/21 1352  TempSrc: Oral         Complications: No notable events documented.

## 2021-02-18 ENCOUNTER — Other Ambulatory Visit: Payer: Self-pay

## 2021-02-18 ENCOUNTER — Observation Stay (HOSPITAL_COMMUNITY): Payer: Medicare Other

## 2021-02-18 ENCOUNTER — Encounter (HOSPITAL_COMMUNITY): Payer: Self-pay | Admitting: Urology

## 2021-02-18 DIAGNOSIS — R0602 Shortness of breath: Secondary | ICD-10-CM | POA: Diagnosis not present

## 2021-02-18 DIAGNOSIS — R0600 Dyspnea, unspecified: Secondary | ICD-10-CM

## 2021-02-18 LAB — CBC WITH DIFFERENTIAL/PLATELET
Abs Immature Granulocytes: 0.08 10*3/uL — ABNORMAL HIGH (ref 0.00–0.07)
Basophils Absolute: 0 10*3/uL (ref 0.0–0.1)
Basophils Relative: 0 %
Eosinophils Absolute: 0 10*3/uL (ref 0.0–0.5)
Eosinophils Relative: 0 %
HCT: 33 % — ABNORMAL LOW (ref 36.0–46.0)
Hemoglobin: 10.7 g/dL — ABNORMAL LOW (ref 12.0–15.0)
Immature Granulocytes: 1 %
Lymphocytes Relative: 3 %
Lymphs Abs: 0.5 10*3/uL — ABNORMAL LOW (ref 0.7–4.0)
MCH: 32.4 pg (ref 26.0–34.0)
MCHC: 32.4 g/dL (ref 30.0–36.0)
MCV: 100 fL (ref 80.0–100.0)
Monocytes Absolute: 1.2 10*3/uL — ABNORMAL HIGH (ref 0.1–1.0)
Monocytes Relative: 8 %
Neutro Abs: 13 10*3/uL — ABNORMAL HIGH (ref 1.7–7.7)
Neutrophils Relative %: 88 %
Platelets: 197 10*3/uL (ref 150–400)
RBC: 3.3 MIL/uL — ABNORMAL LOW (ref 3.87–5.11)
RDW: 14.6 % (ref 11.5–15.5)
WBC: 14.8 10*3/uL — ABNORMAL HIGH (ref 4.0–10.5)
nRBC: 0 % (ref 0.0–0.2)

## 2021-02-18 LAB — COMPREHENSIVE METABOLIC PANEL
ALT: 25 U/L (ref 0–44)
AST: 42 U/L — ABNORMAL HIGH (ref 15–41)
Albumin: 4.2 g/dL (ref 3.5–5.0)
Alkaline Phosphatase: 52 U/L (ref 38–126)
Anion gap: 10 (ref 5–15)
BUN: 25 mg/dL — ABNORMAL HIGH (ref 8–23)
CO2: 20 mmol/L — ABNORMAL LOW (ref 22–32)
Calcium: 8.7 mg/dL — ABNORMAL LOW (ref 8.9–10.3)
Chloride: 103 mmol/L (ref 98–111)
Creatinine, Ser: 1.78 mg/dL — ABNORMAL HIGH (ref 0.44–1.00)
GFR, Estimated: 28 mL/min — ABNORMAL LOW (ref >60)
Glucose, Bld: 160 mg/dL — ABNORMAL HIGH (ref 70–99)
Potassium: 5.3 mmol/L — ABNORMAL HIGH (ref 3.5–5.1)
Sodium: 133 mmol/L — ABNORMAL LOW (ref 135–145)
Total Bilirubin: 0.3 mg/dL (ref 0.3–1.2)
Total Protein: 6.8 g/dL (ref 6.5–8.1)

## 2021-02-18 LAB — RESPIRATORY PANEL BY PCR

## 2021-02-18 LAB — PROCALCITONIN: Procalcitonin: <0.1 ng/mL

## 2021-02-18 LAB — RESP PANEL BY RT-PCR (FLU A&B, COVID) ARPGX2
Influenza A by PCR: NEGATIVE
Influenza B by PCR: NEGATIVE
SARS Coronavirus 2 by RT PCR: NEGATIVE

## 2021-02-18 LAB — BRAIN NATRIURETIC PEPTIDE: B Natriuretic Peptide: 1747.8 pg/mL — ABNORMAL HIGH (ref 0.0–100.0)

## 2021-02-18 MED ORDER — METHYLPREDNISOLONE SODIUM SUCC 40 MG IJ SOLR
40.0000 mg | Freq: Two times a day (BID) | INTRAMUSCULAR | Status: AC
  Administered 2021-02-18 – 2021-02-19 (×2): 40 mg via INTRAVENOUS
  Filled 2021-02-18 (×2): qty 1

## 2021-02-18 MED ORDER — IPRATROPIUM-ALBUTEROL 0.5-2.5 (3) MG/3ML IN SOLN
3.0000 mL | Freq: Four times a day (QID) | RESPIRATORY_TRACT | Status: DC
  Administered 2021-02-18 – 2021-02-20 (×10): 3 mL via RESPIRATORY_TRACT
  Filled 2021-02-18 (×11): qty 3

## 2021-02-18 MED ORDER — PREGABALIN 50 MG PO CAPS
50.0000 mg | ORAL_CAPSULE | Freq: Two times a day (BID) | ORAL | Status: DC
  Administered 2021-02-18 – 2021-02-21 (×8): 50 mg via ORAL
  Filled 2021-02-18 (×7): qty 1

## 2021-02-18 MED ORDER — ATORVASTATIN CALCIUM 40 MG PO TABS
40.0000 mg | ORAL_TABLET | Freq: Every day | ORAL | Status: DC
  Administered 2021-02-18 – 2021-02-20 (×4): 40 mg via ORAL
  Filled 2021-02-18 (×3): qty 1

## 2021-02-18 MED ORDER — NICOTINE 21 MG/24HR TD PT24
21.0000 mg | MEDICATED_PATCH | Freq: Every day | TRANSDERMAL | Status: DC
  Administered 2021-02-18 – 2021-02-21 (×4): 21 mg via TRANSDERMAL
  Filled 2021-02-18 (×3): qty 1

## 2021-02-18 MED ORDER — FUROSEMIDE 10 MG/ML IJ SOLN
40.0000 mg | Freq: Two times a day (BID) | INTRAMUSCULAR | Status: DC
  Administered 2021-02-18 – 2021-02-20 (×4): 40 mg via INTRAVENOUS
  Filled 2021-02-18 (×4): qty 4

## 2021-02-18 MED ORDER — METHYLPREDNISOLONE SODIUM SUCC 125 MG IJ SOLR
125.0000 mg | Freq: Once | INTRAMUSCULAR | Status: AC
  Administered 2021-02-18: 125 mg via INTRAVENOUS
  Filled 2021-02-18: qty 2

## 2021-02-18 MED ORDER — AZITHROMYCIN 250 MG PO TABS
500.0000 mg | ORAL_TABLET | Freq: Once | ORAL | Status: AC
  Administered 2021-02-18: 500 mg via ORAL
  Filled 2021-02-18: qty 2

## 2021-02-18 MED ORDER — FUROSEMIDE 10 MG/ML IJ SOLN
20.0000 mg | Freq: Once | INTRAMUSCULAR | Status: AC
  Administered 2021-02-18: 20 mg via INTRAVENOUS
  Filled 2021-02-18: qty 2

## 2021-02-18 MED ORDER — SODIUM CHLORIDE 0.9 % IV SOLN
1.0000 g | Freq: Once | INTRAVENOUS | Status: AC
  Administered 2021-02-18: 1 g via INTRAVENOUS
  Filled 2021-02-18: qty 10

## 2021-02-18 NOTE — Consult Note (Addendum)
Initial Consultation Note   Patient: Kelly [name] SAY:[id number] DOB: [birth year] PCP: Joya Gaskins, FNP DOA: 02/17/2021 DOS: the patient was seen and examined on 02/18/2021 Primary service: Alexis Frock, MD  Referring physician: Dr. Tresa Moore Reason for consult: Dyspnea  Assessment/Plan: Assessment and Plan: No notes have been filed under this hospital service. Service: Hospitalist  Dyspnea     - COPD exacerbation vs HF exacerbation     - CXR w/ edema, no peripheral edema; she's received fluids for orthostasis yesterday     - she isn't moving a whole lot of air on exam     - will give OT lasix and OT solumedrol; add duonebs     - check procal: CXR w/ possible PNA     - checking CMP, CBC     - wean O2 as able UPDATE: BNP is significantly elevated and procal is negative; CT chest shows pulm edema/multifocal pna; echo is pending. Her picture still isn't completely clear. There was no COVID screen in the system. We will check COVID/Flu/RVP. Continue steroids, nebs, lasix. +/- on addition of abx given that procal is negative.   Acute on chronic HFpEF     - lasix, echo     - daily wts, I&O     - hold fluids  Multifocal PNA     - procal is negative; is this viral? Check COVID/Flu/RVP     - give OT rocephin/zithro; repeat AM procal  AKI     - Scr is up, but she's showing signs of HF exacerbation, getting lasix; follow Scr for now, watch other nephrotoxins  PAF HTN     - BP affecting meds held d/t hypotension     - eliquis held for procedure     - resume home eliquis when cleared to by urology     - today, she will get a dose of lasix for possible HF exacerbation; add back her other meds as she tolerate  Remainder per primary team.   [facility] will continue to follow the patient.  HPI: Kelly [name] is a 81 y.o. female with past medical history of COPD, HFpEF, GAD, PAF. Presenting with a left ureteral mass. She was admitted to the urology service for a left ureteroscopy. She  successfully completed that procedure yesterday. She had some orthostatic hypotension post procedure, so she was placed in ON obs and given fluids. Her BP/HF meds were held. This morning she is more short of breath. She reports to the primary team that at baseline her sats are in the 80's and she has had recommendations for supplemental O2 in the past, but she has refused them. She denies CP, fever, cough. With persistent hypoxia/dyspnea, urology consulted [place] for assistance.    Review of Systems: As mentioned in the history of present illness. All other systems reviewed and are negative. Past Medical History:  Diagnosis Date   Anemia    Anxiety    Arthritis    B12 deficiency 05/2015   B12 was 184   Cancer (HCC)    CHF (congestive heart failure) (HCC)    COPD (chronic obstructive pulmonary disease) (HCC)    Crohn's disease, small intestine ([place]) 02/19/2009   Depression 10/2013   chronic recurrent major depressive disorder   Dysrhythmia    H/O vitamin D deficiency 08/04/2008   Heart murmur    Hypertension    Hypothyroidism    Pneumonia    Recurrent dislocation of hip, right    Past Surgical History:  Procedure Laterality Date  CYSTOSCOPY WITH RETROGRADE PYELOGRAM, URETEROSCOPY AND STENT PLACEMENT Left 02/17/2021   Procedure: CYSTOSCOPY WITH  RETROGRADE PYELOGRAM, LEFT URETEROSCOPY , BIOPSY AND tumor ablation  STENT PLACEMENT;  Surgeon: Alexis Frock, MD;  Location: [facility];  Service: Urology;  Laterality: Left;   HOLMIUM LASER APPLICATION Left 5/0/5397   Procedure: HOLMIUM LASER APPLICATION;  Surgeon: Alexis Frock, MD;  Location: [facility];  Service: Urology;  Laterality: Left;   JOINT REPLACEMENT     Right hip, 2015   LAPAROSCOPY  06/03/2016   Duodenal ulcer repair   TONSILLECTOMY     TUBAL LIGATION     Social History:  reports that she has been smoking cigarettes. She has a 25.00 pack-year smoking history. She has never used smokeless tobacco. She reports current alcohol use.  She reports that she does not use drugs.  Allergies  Allergen Reactions   Bactrim [Sulfamethoxazole-Trimethoprim] Diarrhea and Nausea And Vomiting   Bee Venom Anaphylaxis    Family History  Problem Relation Age of Onset   Heart disease Mother    Alcohol abuse Mother    Colon cancer Sister    Breast cancer Sister    Heart attack Brother    Cancer Neg Hx    Diabetes Neg Hx     Prior to Admission medications   Medication Sig Start Date End Date Taking? Authorizing Provider  albuterol (VENTOLIN HFA) 108 (90 Base) MCG/ACT inhaler Inhale 1 puff into the lungs every 6 (six) hours as needed for wheezing or shortness of breath.   Yes [provider]  apixaban (ELIQUIS) 2.5 MG TABS tablet Take 1 tablet (2.5 mg total) by mouth 2 (two) times daily. 02/09/21  Yes Cantwell, Celeste C, PA-C  atorvastatin (LIPITOR) 40 MG tablet Take 1 tablet (40 mg total) by mouth at bedtime. 09/30/20  Yes Cantwell, Celeste C, PA-C  b complex vitamins tablet Take 1 tablet by mouth daily.   Yes [provider]  budesonide-formoterol (SYMBICORT) 160-4.5 MCG/ACT inhaler Inhale 2 puffs into the lungs 2 (two) times daily as needed (shortness of breath or wheezing).   Yes [provider]  digoxin (LANOXIN) 0.125 MG tablet TAKE 1 TABLET(0.125 MG) BY MOUTH DAILY 12/31/20  Yes Cantwell, Celeste C, PA-C  diltiazem (CARDIZEM CD) 180 MG 24 hr capsule Take 180 mg by mouth daily.   Yes [provider]  DULoxetine (CYMBALTA) 60 MG capsule Take 60 mg by mouth daily. 08/19/20  Yes [provider]  EPINEPHrine 0.3 mg/0.3 mL IJ SOAJ injection Inject 0.3 mg into the muscle as needed for anaphylaxis.   Yes [provider]  ferrous sulfate 325 (65 FE) MG EC tablet Take 1 tablet (325 mg total) by mouth daily with breakfast. 08/02/16  Yes [place]ique, Betty G, MD  folic acid (FOLVITE) 1 MG tablet Take 1 mg by mouth daily.   Yes [provider]  furosemide (LASIX) 20 MG tablet Take 20 mg  by mouth daily. 09/02/20  Yes [provider]  metoprolol succinate (TOPROL-XL) 25 MG 24 hr tablet Take 25 mg by mouth daily.   Yes [provider]  nicotine (NICODERM CQ - DOSED IN MG/24 HOURS) 21 mg/24hr patch Place 1 patch (21 mg total) onto the skin daily. 06/04/20  Yes Adrian Prows, MD  pantoprazole (PROTONIX) 40 MG tablet Take 40 mg by mouth daily.   Yes [provider]  pregabalin (LYRICA) 50 MG capsule Take 50 mg by mouth 2 (two) times daily.   Yes [provider]  REPATHA 140 MG/ML  SOSY INJECT 1ML UNDER THE SKIN EVERY 14 DAYS AS DIRECTED 02/02/21  Yes Cantwell, Celeste C, PA-C  tiotropium (SPIRIVA) 18 MCG inhalation capsule Place 18 mcg into inhaler and inhale daily as needed (shortness of breath).   Yes [provider]  traMADol (ULTRAM) 50 MG tablet Take 1 tablet (50 mg total) by mouth every 6 (six) hours as needed for moderate pain or severe pain (post-operatively). 02/17/21 02/17/22 Yes Alexis Frock, MD  TRELEGY ELLIPTA 100-62.5-25 MCG/INH AEPB Take 1 puff by mouth daily as needed (shortness of breath or wheezing). 08/31/20  Yes [provider]  Bempedoic Acid-Ezetimibe (NEXLIZET) 180-10 MG TABS Take 1 tablet by mouth daily. Patient not taking: Reported on 02/10/2021 09/30/20   Alethia Berthold, PA-C  Spacer/Aero-Holding Chambers (VORTEX VALVED HOLDING CHAMBER) DEVI by Does not apply route.    [provider]    Physical Exam: Vitals:   02/18/21 0407 02/18/21 0842 02/18/21 0915 02/18/21 1204  BP: (!) 112/53 (!) 131/56  131/71  Pulse: 67 67  76  Resp: 18 (!) 22 (!) 24 (!) 24  Temp: 97.9 F (36.6 C) 98.5 F (36.9 C)  98.5 F (36.9 C)  TempSrc: Oral Oral  Oral  SpO2: 96% 91% (!) 88% 91%  Weight:      Height:       General: 81 y.o. female resting in bed in NAD Eyes: PERRL, normal sclera ENMT: Nares patent w/o discharge, orophaynx clear, dentition normal, ears w/o discharge/lesions/ulcers Neck: Supple, trachea  midline Cardiovascular: RRR, +S1, S2, no m/g/r, equal pulses throughout Respiratory: decreased breath sounds, tight, no rhales, scattered wheeze noted; increased WOB on 6L [place] GI: BS+, NDNT, no masses noted, no organomegaly noted MSK: No e/c/c Neuro: A&O x 3, no focal deficits Psyc: Appropriate interaction and affect, calm/cooperative   Data Reviewed:   Results are pending, will review when available.   Family Communication: None at bedside.  Primary team communication: Spoke with Dr. Tresa Moore; updated w/ plan. Thank you very much for involving [place] in the care of your patient.  Author: Jonnie Finner, DO 02/18/2021 2:03 PM  For on call review [email or web address].

## 2021-02-18 NOTE — Progress Notes (Addendum)
Patient called for assistance to bathroom.  When I came into room, patient reported feeling like she "couldn't breathe."  SpO2 95% on 4 L/min [place].  When asked if she ever felt this way at home, she said yes, and that when she did, she called "the paramedics," who came and put "a thing on [her] face."    PRN albuterol neb administered.  Reports feeling "a little better."  Will continue to monitor.  Patient ambulated to bathroom with assistance several times throughout the night, per nightshift RN.  Urine output of 400 mL bloody urine with clots.  Angie Fava, RN

## 2021-02-18 NOTE — Op Note (Unsigned)
Kelly [name], Kelly [name] MEDICAL RECORD NO: [id number] ACCOUNT NO: [id number] DATE OF BIRTH: 06/16/1940 FACILITY: [facility] LOCATION: [facility] PHYSICIAN: Alexis Frock, MD  Operative Report   DATE OF PROCEDURE: 02/12/2021   PREOPERATIVE DIAGNOSES:  Left hydronephrosis, suspected ureteral mass.  POSTOPERATIVE DIAGNOSIS:  Left ureteral mass.  PROCEDURE PERFORMED:   1.  Cystoscopy, bilateral retrograde pyelograms and interpretation. 2.  Left ureteroscopy with biopsy and tumor ablation of left ureteral tumor. 3.  Successful left ureteral stent placement.  ESTIMATED BLOOD LOSS:  Nil.  COMPLICATIONS:  None.  SPECIMEN:  Left ureteral mass ***.  FINDINGS:   1.  Unremarkable right retrograde pyelogram. 2.  Unremarkable bladder. 3.  Significant hydronephrosis and tortuosity of the left. 4.  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 5.  Complete resolution of all intraluminal tumor following ureteroscopic ablation. 6.  Successful placement of left ureteral stent, proximal end in the renal pelvis, distal end in urinary bladder.  No tether.   INDICATIONS:  The patient is a comorbid 81 year old lady with history of COPD.  She continues to smoke.  She was found on workup of hematuria to have a left distal ureteral mass with enhancement and what appeared to be chronic hydronephrosis.  No obvious  adenopathy.  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  Options were discussed for management including recommended path of initial endoscopic procedure with the goal of tissue biopsy, possible resection and stenting.  She  wished to proceed.  Informed consent was obtained and placed in medical record.  PROCEDURE IN DETAIL:  The patient is being identified and procedure being cysto, bilateral retrogrades, left ureteroscopy with possible laser ablation and biopsy was confirmed.  Procedure time-out was performed, intravenous antibiotics administered.    General LMA anesthesia induced.  The patient was placed into a low lithotomy position.  Sterile field was created.  Prepped and draped the patient's vagina, introitus and proximal thighs using iodine.  Cystourethroscopy was performed using a 21-French  rigid cystoscope with the offset lens.  Inspection of the bladder was unremarkable.  Ureteral orifices were singleton.  The right ureteral orifice was cannulated with a 6-French renal catheter and a right retrograde pyelogram was obtained.  Right retrograde pyelogram demonstrated a single right ureter, single system right kidney.  No filling defects or narrowing noted.  Similarly, a left retrograde pyelogram was obtained.  Left retrograde pyelogram demonstrated a single left ureter with significant hydronephrosis and filling defect in the distal ureter consistent with known mass.  There was contrast filling above this with significant tortuosity and hydronephrosis of the  ureter that was severe.  A 0.038 ZIPwire was advanced to the level of the kidney as a safety wire.  An 8-French feeding tube placed in the urinary bladder for pressure release.  Next, a semirigid ureteroscopy was performed distal four-fifths of left  ureter alongside a separate sensor working wire and in the distal ureter, there was a very smooth appearing mass that was quite obstructive.  This was relatively nonpapillary and somewhat unusual, did not have the typical appearance of high-grade  urothelial neoplasm. As we had sufficient wire access above this it was felt that biopsy was warranted and given the tumor orientation, it was felt that a basketing snaring technique would be most efficient as such an Escape basket was used to snare the  tumor.  This was performed times several and was highly successful.  Very large tumor fragments were resected using this technique.  This made approximately  2 cm3 total tumor volume removed set aside for pathology.  I was frankly amazed at the  success of  the resection using the steering technique, the tumor with further resection was clearly seen that it was connected to the ureter by relatively small and relatively avascular stalk.  This was ablated using holmium laser energy using settings of 1 joule  and 5 Hz down flush to the superficial fibromuscular stroma of the ureter.  No perforation occurred.  I was quite happy with the ablation of this lesion.  Additional ureteroscopy was performed using semi-rigid technique for distal two-thirds left ureter.   No mucositis were found.  The semirigid scope was then exchanged over the sensor working wire to the level of the kidney for a single channel digital ureteroscope and flexible digital ureteroscopy was performed of the left kidney calyces x3 and the  entire length left ureter.  There were no additional lesions noted whatsoever.  This was quite favorable. Given the prolonged hydronephrosis and unclear etiology of her mass, it was felt that interval stenting with a non-tethered stent was prudent such a  new 5 x 26 Polaris type stent was placed using fluoroscopic guidance, good proximal and distal plane were noted.  Procedure was terminated.  The patient tolerated procedure well.  No immediate complications.  The patient was taken to postanesthesia care  in stable condition.  Plan for discharge home.  Further treatment pending histology of the lesion.     SUJ D: 02/17/2021 4:44:33 pm T: 02/18/2021 12:33:00 am  JOB: [id number]/ [id number]

## 2021-02-18 NOTE — TOC Initial Note (Signed)
Transition of Care [facility]) - Initial/Assessment Note    Patient Details  Name: Kelly [name] MRN: [id number] Date of Birth: 1940-12-09  Transition of Care [facility]) CM/SW Contact:    Leeroy Cha, RN Phone Number: 02/18/2021, 9:31 AM  Clinical Narrative:                  Transition of Care [facility]) Screening Note   Patient Details  Name: Kelly [name] Date of Birth: [birth year]   [facility]) CM/SW Contact:    Leeroy Cha, RN Phone Number: 02/18/2021, 9:31 AM    Transition of Care Department [facility]) has reviewed patient and no TOC needs have been identified at this time. We will continue to monitor patient advancement through interdisciplinary progression rounds. If new patient transition needs arise, please place a TOC consult.    Expected Discharge Plan: Home/Self Care Barriers to Discharge: Continued Medical Work up   Patient Goals and CMS Choice Patient states their goals for this hospitalization and ongoing recovery are:: to go home   Choice offered to / list presented to : Patient  Expected Discharge Plan and Services Expected Discharge Plan: Home/Self Care   Discharge Planning Services: CM Consult   Living arrangements for the past 2 months: Single Family Home Expected Discharge Date: 02/17/21                                    Prior Living Arrangements/Services Living arrangements for the past 2 months: Single Family Home Lives with:: Self Patient language and need for interpreter reviewed:: Yes Do you feel safe going back to the place where you live?: Yes            Criminal Activity/Legal Involvement Pertinent to Current Situation/Hospitalization: No - Comment as needed  Activities of Daily Living Home Assistive Devices/Equipment: None ADL Screening (condition at time of admission) Patient's cognitive ability adequate to safely complete daily activities?: Yes Is the patient deaf or have difficulty hearing?: No Does the  patient have difficulty seeing, even when wearing glasses/contacts?: No Does the patient have difficulty concentrating, remembering, or making decisions?: No Patient able to express need for assistance with ADLs?: Yes Does the patient have difficulty dressing or bathing?: Yes Independently performs ADLs?: Yes (appropriate for developmental age) Does the patient have difficulty walking or climbing stairs?: Yes Weakness of Legs: Both Weakness of Arms/Hands: None  Permission Sought/Granted                  Emotional Assessment Appearance:: Appears stated age     Orientation: : Oriented to Self, Oriented to Place, Oriented to  Time, Oriented to Situation Alcohol / Substance Use: Not Applicable Psych Involvement: No (comment)  Admission diagnosis:  Hydronephrosis [N13.30] Patient Active Problem List   Diagnosis Date Noted   Hydronephrosis 02/17/2021   Acute respiratory failure with hypoxia ([place]) 09/13/2020   COPD with acute exacerbation ([place]) 09/13/2020   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   Asymptomatic bilateral carotid artery stenosis 08/13/2020   Paroxysmal atrial fibrillation ([place]) 08/13/2020   Mixed hyperlipidemia 04/23/2020   Primary hypertension 04/23/2020   Severe concentric left ventricular hypertrophy 09/26/2016   Hypothyroidism, unspecified 09/26/2016   B12 deficiency 08/15/2016   Anemia, iron deficiency 08/15/2016   Bilateral lower extremity edema 08/02/2016   Chronic pain disorder 34/74/2595   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 [place], [place]. [place] [address] Phone: [phone] Fax: [place],  [address] [address] [place] [address] Phone: [phone] Fax: [phone]  [id number] - [address], [place] - [place] [address] [place] [address] Phone: [phone] Fax: [phone]     Social Determinants of Health ([place]) Interventions    Readmission Risk Interventions No flowsheet data found.

## 2021-02-18 NOTE — Anesthesia Postprocedure Evaluation (Signed)
Anesthesia Post Note  Patient: Kelly [name]  Procedure(s) Performed: CYSTOSCOPY WITH  RETROGRADE PYELOGRAM, LEFT URETEROSCOPY , BIOPSY AND tumor ablation  STENT PLACEMENT (Left: Ureter) HOLMIUM LASER APPLICATION (Left)     Patient location during evaluation: PACU Anesthesia Type: General Level of consciousness: awake and alert Pain management: pain level controlled Vital Signs Assessment: post-procedure vital signs reviewed and stable Respiratory status: spontaneous breathing, nonlabored ventilation, respiratory function stable and patient connected to nasal cannula oxygen Cardiovascular status: blood pressure returned to baseline and stable Postop Assessment: no apparent nausea or vomiting Anesthetic complications: no   No notable events documented.  Last Vitals:  Vitals:   02/18/21 1204 02/18/21 1426  BP: 131/71   Pulse: 76   Resp: (!) 24   Temp: 36.9 C   SpO2: 91% 93%    Last Pain:  Vitals:   02/18/21 1204  TempSrc: Oral  PainSc:                  March Rummage Mylon Mabey

## 2021-02-18 NOTE — Progress Notes (Signed)
1 Day Post-Op   Subjective/Chief Complaint:  1 - LEFT Ureteral Mass - s/p LEFT ureteroscopy / biopsy / resection of ureteral mass + stent placement 02/17/2021. Path pending.   1 - Orthostasis, Acute on Chronic COPD - Long h/o COPD on inhailers (refuses home O2) and some CHF on lasix with some orthostasis POD 0 after ureteroscopy. Held BP meds and lasix and BP / orthostasis much improved 2/2, but now c/o worsening SOB. Baseline O2 sats mid 80s on room air.  Today "Kelly [name]" c/o worsened SOB. She was given substantial fluids yesterday by anesthesia staff for orthostasis. She no longer has low BP or orthostatic complaints.   Objective: Vital signs in last 24 hours: Temp:  [97.5 F (36.4 C)-98.5 F (36.9 C)] 98.5 F (36.9 C) (02/02 1204) Pulse Rate:  [29-109] 76 (02/02 1204) Resp:  [10-24] 24 (02/02 1204) BP: (64-144)/(33-81) 131/71 (02/02 1204) SpO2:  [87 %-100 %] 91 % (02/02 1204) Weight:  [47.1 kg] 47.1 kg (02/02 0100) Last BM Date: 02/17/21  Intake/Output from previous day: 02/01 0701 - 02/02 0700 In: 1395.2 [P.O.:360; I.V.:482.7; IV Piggyback:52.5] Out: 400 [Urine:400] Intake/Output this shift: Total I/O In: -  Out: 160 [Urine:160]  General appearance: alert, cooperative, and "feels like shit, mostly breathing" Head: Normocephalic, without obvious abnormality, atraumatic Eyes: negative Nose: Nares normal. Septum midline. Mucosa normal. No drainage or sinus tenderness., On [place] O2.  Throat: lips, mucosa, and tongue normal; teeth and gums normal Neck: supple, symmetrical, trachea midline Resp: Some increased work of breathing over baseline. No distress.  Cardio: Nl rate Extremities: extremities normal, atraumatic, no cyanosis or edema and again, minimal edema.  Pulses: 2+ and symmetric Skin: Skin color, texture, turgor normal. No rashes or lesions Neurologic: Grossly normal  Lab Results:  No results for input(s): WBC, HGB, HCT, PLT in the last 72 hours. BMET No results for  input(s): NA, K, CL, CO2, GLUCOSE, BUN, CREATININE, CALCIUM in the last 72 hours. PT/INR No results for input(s): LABPROT, INR in the last 72 hours. ABG No results for input(s): PHART, HCO3 in the last 72 hours.  Invalid input(s): PCO2, PO2  Studies/Results: DG C-Arm 1-60 Min-No Report  Result Date: 02/17/2021 Fluoroscopy was utilized by the requesting physician.  No radiographic interpretation.    Anti-infectives: Anti-infectives (From admission, onward)    Start     Dose/Rate Route Frequency Ordered Stop   02/17/21 1335  gentamicin (GARAMYCIN) 100 mg in dextrose 5 % 50 mL IVPB        100 mg 105 mL/hr over 30 Minutes Intravenous 30 min pre-op 02/17/21 1335 02/17/21 1611       Assessment/Plan:  1 - LEFT Ureteral Mass - estimate 98% resected with ureteroscopy, further management pending path results. She is NOT operative candidate for major surgery.   1 - Orthostasis, Acute on Chronic COPD - initial orthostaiss likely 2/2 BP meds and diuretics with NPO status, then some overload with corrective measures. Will perform CXR and obtain hospitalist consult. Continue O2.       Alexis Frock 02/18/2021

## 2021-02-18 NOTE — Progress Notes (Addendum)
Patient continues to c/o SOB.  Accessory muscles used.  Auscultated fine crackles in bilateral lower lobes.  SpO2 90-92% on 6 L/min [place].  Respiratory therapist at bedside.  MD paged.  MD called back.  Recommended ambulating patient.  Ordered RN to call back if patient's SpO2 dropped below 85%.  Patient ambulated 80 feet on 6 L/min O2.  Tolerated fair.  Lowest SpO2 reading 85%.  Will continue to monitor.  Angie Fava, RN

## 2021-02-19 ENCOUNTER — Inpatient Hospital Stay (HOSPITAL_COMMUNITY): Payer: Medicare Other

## 2021-02-19 DIAGNOSIS — I951 Orthostatic hypotension: Secondary | ICD-10-CM | POA: Diagnosis not present

## 2021-02-19 DIAGNOSIS — J189 Pneumonia, unspecified organism: Secondary | ICD-10-CM | POA: Diagnosis present

## 2021-02-19 DIAGNOSIS — Z811 Family history of alcohol abuse and dependence: Secondary | ICD-10-CM | POA: Diagnosis not present

## 2021-02-19 DIAGNOSIS — K5090 Crohn's disease, unspecified, without complications: Secondary | ICD-10-CM | POA: Diagnosis present

## 2021-02-19 DIAGNOSIS — I480 Paroxysmal atrial fibrillation: Secondary | ICD-10-CM | POA: Diagnosis present

## 2021-02-19 DIAGNOSIS — Z8249 Family history of ischemic heart disease and other diseases of the circulatory system: Secondary | ICD-10-CM | POA: Diagnosis not present

## 2021-02-19 DIAGNOSIS — Z96641 Presence of right artificial hip joint: Secondary | ICD-10-CM | POA: Diagnosis present

## 2021-02-19 DIAGNOSIS — E785 Hyperlipidemia, unspecified: Secondary | ICD-10-CM | POA: Diagnosis present

## 2021-02-19 DIAGNOSIS — C689 Malignant neoplasm of urinary organ, unspecified: Secondary | ICD-10-CM | POA: Diagnosis present

## 2021-02-19 DIAGNOSIS — I5033 Acute on chronic diastolic (congestive) heart failure: Secondary | ICD-10-CM

## 2021-02-19 DIAGNOSIS — I272 Pulmonary hypertension, unspecified: Secondary | ICD-10-CM | POA: Diagnosis present

## 2021-02-19 DIAGNOSIS — J431 Panlobular emphysema: Secondary | ICD-10-CM | POA: Diagnosis present

## 2021-02-19 DIAGNOSIS — Z20822 Contact with and (suspected) exposure to covid-19: Secondary | ICD-10-CM | POA: Diagnosis present

## 2021-02-19 DIAGNOSIS — N1330 Unspecified hydronephrosis: Secondary | ICD-10-CM | POA: Diagnosis present

## 2021-02-19 DIAGNOSIS — K219 Gastro-esophageal reflux disease without esophagitis: Secondary | ICD-10-CM | POA: Diagnosis present

## 2021-02-19 DIAGNOSIS — I130 Hypertensive heart and chronic kidney disease with heart failure and stage 1 through stage 4 chronic kidney disease, or unspecified chronic kidney disease: Secondary | ICD-10-CM | POA: Diagnosis present

## 2021-02-19 DIAGNOSIS — E039 Hypothyroidism, unspecified: Secondary | ICD-10-CM | POA: Diagnosis present

## 2021-02-19 DIAGNOSIS — J441 Chronic obstructive pulmonary disease with (acute) exacerbation: Secondary | ICD-10-CM | POA: Diagnosis not present

## 2021-02-19 DIAGNOSIS — E876 Hypokalemia: Secondary | ICD-10-CM | POA: Diagnosis present

## 2021-02-19 DIAGNOSIS — N179 Acute kidney failure, unspecified: Secondary | ICD-10-CM | POA: Diagnosis present

## 2021-02-19 DIAGNOSIS — D638 Anemia in other chronic diseases classified elsewhere: Secondary | ICD-10-CM | POA: Diagnosis present

## 2021-02-19 DIAGNOSIS — N1831 Chronic kidney disease, stage 3a: Secondary | ICD-10-CM | POA: Diagnosis present

## 2021-02-19 DIAGNOSIS — Z800 Family history of malignant neoplasm of digestive organs: Secondary | ICD-10-CM | POA: Diagnosis not present

## 2021-02-19 DIAGNOSIS — J9601 Acute respiratory failure with hypoxia: Secondary | ICD-10-CM | POA: Diagnosis present

## 2021-02-19 DIAGNOSIS — N1339 Other hydronephrosis: Secondary | ICD-10-CM | POA: Diagnosis not present

## 2021-02-19 DIAGNOSIS — R011 Cardiac murmur, unspecified: Secondary | ICD-10-CM | POA: Diagnosis present

## 2021-02-19 DIAGNOSIS — Z803 Family history of malignant neoplasm of breast: Secondary | ICD-10-CM | POA: Diagnosis not present

## 2021-02-19 LAB — CBC
HCT: 32.4 % — ABNORMAL LOW (ref 36.0–46.0)
Hemoglobin: 10.5 g/dL — ABNORMAL LOW (ref 12.0–15.0)
MCH: 32.1 pg (ref 26.0–34.0)
MCHC: 32.4 g/dL (ref 30.0–36.0)
MCV: 99.1 fL (ref 80.0–100.0)
Platelets: 162 10*3/uL (ref 150–400)
RBC: 3.27 MIL/uL — ABNORMAL LOW (ref 3.87–5.11)
RDW: 14.6 % (ref 11.5–15.5)
WBC: 11.9 10*3/uL — ABNORMAL HIGH (ref 4.0–10.5)
nRBC: 0 % (ref 0.0–0.2)

## 2021-02-19 LAB — BASIC METABOLIC PANEL
Anion gap: 13 (ref 5–15)
BUN: 29 mg/dL — ABNORMAL HIGH (ref 8–23)
CO2: 21 mmol/L — ABNORMAL LOW (ref 22–32)
Calcium: 8.7 mg/dL — ABNORMAL LOW (ref 8.9–10.3)
Chloride: 101 mmol/L (ref 98–111)
Creatinine, Ser: 1.52 mg/dL — ABNORMAL HIGH (ref 0.44–1.00)
GFR, Estimated: 34 mL/min — ABNORMAL LOW (ref >60)
Glucose, Bld: 183 mg/dL — ABNORMAL HIGH (ref 70–99)
Potassium: 3.9 mmol/L (ref 3.5–5.1)
Sodium: 135 mmol/L (ref 135–145)

## 2021-02-19 LAB — SURGICAL PATHOLOGY

## 2021-02-19 LAB — PROCALCITONIN: Procalcitonin: 0.1 ng/mL

## 2021-02-19 NOTE — Progress Notes (Addendum)
PROGRESS NOTE Kelly [name]  WNI:[id number] DOB: 1940/12/10 DOA: 02/17/2021 PCP: Joya Gaskins, FNP   Brief Narrative/Hospital Course: Kelly [name], 81 y.o. female  COPD, HFpEF, GAD, PAF. Presenting with a left ureteral mass s/p left ureteroscopy 2/1,had some orthostatic hypotension post procedure, so she was placed in ON obs and given fluids. Her BP/HF meds were held.  2/2 am was more short of breath.She reports to the primary team that at baseline her sats are in the 80's and she has had recommendations for supplemental O2 in the past, but she has refused them. She denies CP, fever, cough. With persistent hypoxia/dyspnea, urology consulted [facility] for assistance.  Underwent CT chest 2/2 that showed mild pulmonary edema, small bilateral pleural effusion with bibasilar consolidations and groundglass opacities concerning for superimposed multifocal pneumonia.  Further work-up showed negative/normal procalcitonin but significantly elevated BNP 1747, negative troponin.  Patient was placed on diuretics  Subjective: Seen examined this morning. Feels lot better this am. On 3 l [place] this am. Notwheezing Reports she was in ED and given lasix x 7 days on 1/17  Assessment and Plan:  Left ureteral mass status post ureteroscopy : Continue plan of care as per urology team.    Acute hypoxic respiratory failure Dyspnea Acute on chronic diastolic CHF: Patient presentation consistent with acute on chronic diastolic CHF.  Although leukocytosis imaging reported as possible multifocal pneumonia suspect this is mostly fluid with history of CHF, meds were held and also got IV fluids periprocedure, and has significant elevated BNP, CT chest reviewed.  Chest x-ray repeated this morning-cardiomegaly bilateral pleural effusion more on the right, improvement in aeration of the left perihilar region-so we will continue IV diuresis.  Monitor intake output Daily weight, follow-up echocardiogram.  She is followed by Dr.  Einar Gip as outpatient Net IO Since Admission: -50.76 mL [02/19/21 1157]  Filed Weights   02/18/21 0100  Weight: 47.1 kg     Panlobular emphysema COPD: Currently not wheezing, continue bronchodilators. Chronic hypoxic aspiratory failure it appears patient was recommended oxygen but she has refused.  We will see if she can be weaned off oxygen if not she will need to go home with oxygen  PAF Hypertension: BP meds were held due to hypotension Eliquis held for procedure.  Resume Eliquis if okay with urology- per Dr Tresa Moore could resume Sunday or monday. Continue diuresis monitor in telemetry  AKI on CKD IIIa: creat 1.2 on 1/27.23Creatinine improving with diuresis monitor closely Recent Labs  Lab 02/18/21 1446 02/19/21 0649  BUN 25* 29*  CREATININE 1.78* 1.52*   Leukocytosis likely reactive postop.Downtrending.  Did receive antibiotics 2/2 procalcitonin reassuring.  Tobacco abuse cessation discussed.  DVT prophylaxis: SCDs Start: 02/17/21 2027 Code Status:   Code Status: Full Code Family Communication: plan of care discussed with patientat bedside.  Disposition: Currently not medically stable for discharge. Status is: Inpatient  Objective: Vitals last 24 hrs: Vitals:   02/19/21 0210 02/19/21 0549 02/19/21 0735 02/19/21 0827  BP: (!) 155/62 (!) 152/62  (!) 116/55  [name]: 89 94  93  Resp: 20 18    Temp: 98.6 F (37 C) 98.5 F (36.9 C)    TempSrc: Oral Oral    SpO2: 93% 96% 96% 94%  Weight:      Height:       Weight change:   Physical Examination: General exam: AA, thin, older than stated age, weak appearing. HEENT:Oral mucosa moist, Ear/Nose WNL grossly, dentition normal. Respiratory system: bilaterally diminished BS, mild crackles, no use of  accessory muscle Cardiovascular system: S1 & S2 +, No JVD,. Gastrointestinal system: Abdomen soft,NT,ND, BS+ Nervous System:Alert, awake, moving extremities and grossly nonfocal Extremities: LE edema none,distal peripheral pulses  palpable.  Skin: No rashes,no icterus. MSK: Normal muscle bulk,tone, power  Medications reviewed:  Scheduled Meds:  atorvastatin  40 mg Oral QHS   digoxin  0.125 mg Oral Daily   DULoxetine  60 mg Oral Daily   furosemide  40 mg Intravenous BID   ipratropium-albuterol  3 mL Nebulization Q6H   nicotine  21 mg Transdermal Daily   pantoprazole  40 mg Oral Daily   pregabalin  50 mg Oral BID   senna-docusate  1 tablet Oral BID   Continuous Infusions:    Diet Order             Diet regular Room service appropriate? Yes; Fluid consistency: Thin  Diet effective now                 , 100 Intake/Output Summary (Last 24 hours) at 02/19/2021 0923 Last data filed at 02/19/2021 1448 Gross per 24 hour  Intake 494.01 ml  Output 1650 ml  Net -1155.99 ml   Net IO Since Admission: -170.76 mL [02/19/21 0923]  Wt Readings from Last 3 Encounters:  02/18/21 47.1 kg  02/12/21 43.5 kg  01/13/21 45.6 kg     Unresulted Labs (From admission, onward)     Start     Ordered   02/19/21 0500  Procalcitonin  Daily,   R      02/18/21 1823          Data Reviewed: I have personally reviewed following labs and imaging studies CBC: Recent Labs  Lab 02/18/21 1446 02/19/21 0649  WBC 14.8* 11.9*  NEUTROABS 13.0*  --   HGB 10.7* 10.5*  HCT 33.0* 32.4*  MCV 100.0 99.1  PLT 197 185   Basic Metabolic Panel: Recent Labs  Lab 02/18/21 1446 02/19/21 0649  NA 133* 135  K 5.3* 3.9  CL 103 101  CO2 20* 21*  GLUCOSE 160* 183*  BUN 25* 29*  CREATININE 1.78* 1.52*  CALCIUM 8.7* 8.7*   GFR: Estimated Creatinine Clearance: 21.6 mL/min (A) (by C-G formula based on SCr of 1.52 mg/dL (H)). Liver Function Tests: Recent Labs  Lab 02/18/21 1446  AST 42*  ALT 25  ALKPHOS 52  BILITOT 0.3  PROT 6.8  ALBUMIN 4.2   No results for input(s): LIPASE, AMYLASE in the last 168 hours. No results for input(s): AMMONIA in the last 168 hours. Coagulation Profile: No results for input(s): INR, PROTIME in  the last 168 hours. Cardiac Enzymes: No results for input(s): CKTOTAL, CKMB, CKMBINDEX, TROPONINI in the last 168 hours. BNP (last 3 results) No results for input(s): PROBNP in the last 8760 hours. HbA1C: No results for input(s): HGBA1C in the last 72 hours. CBG: No results for input(s): GLUCAP in the last 168 hours. Lipid Profile: No results for input(s): CHOL, HDL, LDLCALC, TRIG, CHOLHDL, LDLDIRECT in the last 72 hours. Thyroid Function Tests: No results for input(s): TSH, T4TOTAL, FREET4, T3FREE, THYROIDAB in the last 72 hours. Anemia Panel: No results for input(s): VITAMINB12, FOLATE, FERRITIN, TIBC, IRON, RETICCTPCT in the last 72 hours. Sepsis Labs: Recent Labs  Lab 02/18/21 1446 02/19/21 0336  PROCALCITON <0.10 0.10    Recent Results (from the past 240 hour(s))  Resp Panel by RT-PCR (Flu A&B, Covid) Nasopharyngeal Swab     Status: None   Collection Time: 02/18/21  6:29 PM  Specimen: Nasopharyngeal Swab; Nasopharyngeal(NP) swabs in vial transport medium  Result Value Ref Range Status   SARS Coronavirus 2 by RT PCR NEGATIVE NEGATIVE Final    Comment: (NOTE) SARS-CoV-2 target nucleic acids are NOT DETECTED.  The SARS-CoV-2 RNA is generally detectable in upper respiratory specimens during the acute phase of infection. The lowest concentration of SARS-CoV-2 viral copies this assay can detect is 138 copies/mL. A negative result does not preclude SARS-Cov-2 infection and should not be used as the sole basis for treatment or other patient management decisions. A negative result may occur with  improper specimen collection/handling, submission of specimen other than nasopharyngeal swab, presence of viral mutation(s) within the areas targeted by this assay, and inadequate number of viral copies(<138 copies/mL). A negative result must be combined with clinical observations, patient history, and epidemiological information. The expected result is Negative.  Fact Sheet for  Patients:  EntrepreneurPulse.com.au  Fact Sheet for Healthcare Providers:  IncredibleEmployment.be  This test is no t yet approved or cleared by the [place] FDA and  has been authorized for detection and/or diagnosis of SARS-CoV-2 by FDA under an Emergency Use Authorization (EUA). This EUA will remain  in effect (meaning this test can be used) for the duration of the COVID-19 declaration under Section 564(b)(1) of the Act, 21 U.S.C.section 360bbb-3(b)(1), unless the authorization is terminated  or revoked sooner.       Influenza A by PCR NEGATIVE NEGATIVE Final   Influenza B by PCR NEGATIVE NEGATIVE Final    Comment: (NOTE) The Xpert Xpress SARS-CoV-2/FLU/RSV plus assay is intended as an aid in the diagnosis of influenza from Nasopharyngeal swab specimens and should not be used as a sole basis for treatment. Nasal washings and aspirates are unacceptable for Xpert Xpress SARS-CoV-2/FLU/RSV testing.  Fact Sheet for Patients: EntrepreneurPulse.com.au  Fact Sheet for Healthcare Providers: IncredibleEmployment.be  This test is not yet approved or cleared by the [place] FDA and has been authorized for detection and/or diagnosis of SARS-CoV-2 by FDA under an Emergency Use Authorization (EUA). This EUA will remain in effect (meaning this test can be used) for the duration of the COVID-19 declaration under Section 564(b)(1) of the Act, 21 U.S.C. section 360bbb-3(b)(1), unless the authorization is terminated or revoked.  Performed at [facility], [place] [address], [place], [place] [address]   Respiratory (~20 pathogens) panel by PCR     Status: None   Collection Time: 02/18/21  6:29 PM   Specimen: Nasopharyngeal Swab; Respiratory  Result Value Ref Range Status   Adenovirus NOT DETECTED NOT DETECTED Final   Coronavirus 229E NOT DETECTED NOT DETECTED Final    Comment: (NOTE) The  Coronavirus on the Respiratory Panel, DOES NOT test for the novel  Coronavirus (2019 nCoV)    Coronavirus HKU1 NOT DETECTED NOT DETECTED Final   Coronavirus NL63 NOT DETECTED NOT DETECTED Final   Coronavirus OC43 NOT DETECTED NOT DETECTED Final   Metapneumovirus NOT DETECTED NOT DETECTED Final   Rhinovirus / Enterovirus NOT DETECTED NOT DETECTED Final   Influenza A NOT DETECTED NOT DETECTED Final   Influenza B NOT DETECTED NOT DETECTED Final   Parainfluenza Virus 1 NOT DETECTED NOT DETECTED Final   Parainfluenza Virus 2 NOT DETECTED NOT DETECTED Final   Parainfluenza Virus 3 NOT DETECTED NOT DETECTED Final   Parainfluenza Virus 4 NOT DETECTED NOT DETECTED Final   Respiratory Syncytial Virus NOT DETECTED NOT DETECTED Final   Bordetella pertussis NOT DETECTED NOT DETECTED Final   Bordetella Parapertussis NOT  DETECTED NOT DETECTED Final   Chlamydophila pneumoniae NOT DETECTED NOT DETECTED Final   Mycoplasma pneumoniae NOT DETECTED NOT DETECTED Final    Comment: Performed at [facility], [place] [address] [place], [place] [address]    Antimicrobials: Anti-infectives (From admission, onward)    Start     Dose/Rate Route Frequency Ordered Stop   02/18/21 1915  azithromycin (ZITHROMAX) tablet 500 mg        500 mg Oral  Once 02/18/21 1821 02/18/21 1833   02/18/21 1915  cefTRIAXone (ROCEPHIN) 1 g in sodium chloride 0.9 % 100 mL IVPB        1 g 200 mL/hr over 30 Minutes Intravenous  Once 02/18/21 1822 02/18/21 1910   02/17/21 1335  gentamicin (GARAMYCIN) 100 mg in dextrose 5 % 50 mL IVPB        100 mg 105 mL/hr over 30 Minutes Intravenous 30 min pre-op 02/17/21 1335 02/17/21 1611      Culture/Microbiology    Component Value Date/Time   SDES URINE, CLEAN CATCH 09/13/2020 0851   SPECREQUEST NONE 09/13/2020 0851   CULT (A) 09/13/2020 0851    <10,000 COLONIES/mL INSIGNIFICANT GROWTH Performed at [facility] [address], [place], [place] [address]    REPTSTATUS  09/14/2020 FINAL 09/13/2020 1194    Other culture-see note    Radiology Studies: CT CHEST WO CONTRAST  Result Date: 02/18/2021 CLINICAL DATA:  Asthma, acute EXAM: CT CHEST WITHOUT CONTRAST TECHNIQUE: Multidetector CT imaging of the chest was performed following the standard protocol without IV contrast. RADIATION DOSE REDUCTION: This exam was performed according to the departmental dose-optimization program which includes automated exposure control, adjustment of the mA and/or kV according to patient size and/or use of iterative reconstruction technique. COMPARISON:  Chest radiograph 02/18/2021 FINDINGS: Cardiovascular: Normal cardiac size.No pericardial disease.Mildly enlarged branch pulmonary arteries.Moderate to severe aortic arch atherosclerotic calcifications. Mediastinum/Nodes: No lymphadenopathy.The thyroid is unremarkable.Esophagus is unremarkable.The trachea is unremarkable. Lungs/Pleura: Diffuse mild bronchial wall thickening.There is interlobular septal thickening and ground-glass opacities bilaterally, most prominent in the upper lungs and right middle and lower lobes.There are small bilateral pleural effusions, right greater than left.Bibasilar consolidations and ground-glass opacities. No pneumothorax. Upper Abdomen: There is a partially visualized left nephroureteral stent. No acute abnormality. Musculoskeletal: Multilevel degenerative changes of the spine. No acute osseous abnormality.No suspicious lytic or blastic lesions. IMPRESSION: Mild pulmonary edema and small bilateral pleural effusions with bibasilar consolidations and ground-glass opacities, concerning for superimposed multifocal pneumonia. Aortic Atherosclerosis (ICD10-I70.0). Electronically Signed   By: Maurine Simmering M.D.   On: 02/18/2021 17:47   DG CHEST PORT 1 VIEW  Result Date: 02/18/2021 CLINICAL DATA:  COPD exacerbation (HCC) J44.1 (ICD-10-CM) EXAM: PORTABLE CHEST 1 VIEW COMPARISON:  09/05/2020. FINDINGS: Small left greater  than right pleural effusions. Overlying bibasilar opacities. Mild interstitial prominence. No visible pneumothorax. Mild enlargement of the cardiac silhouette, similar. Calcific atherosclerosis of the aorta. IMPRESSION: 1. Small left greater than right pleural effusions. Overlying bibasilar opacities, which could represent atelectasis and/or pneumonia. 2. Mild interstitial prominence, which could represent the sequela of recurrent bouts of CHF versus mild interstitial edema. 3. Chronic cardiomegaly. Electronically Signed   By: Margaretha Sheffield M.D.   On: 02/18/2021 13:42   DG C-Arm 1-60 Min-No Report  Result Date: 02/17/2021 Fluoroscopy was utilized by the requesting physician.  No radiographic interpretation.     LOS: 0 days   Antonieta Pert, MD [facility]  02/19/2021, 9:23 AM

## 2021-02-19 NOTE — Progress Notes (Signed)
2 Days Post-Op   Subjective/Chief Complaint:  1 - LEFT Ureteral Mass - s/p LEFT ureteroscopy / biopsy / resection of ureteral mass + stent placement 02/17/2021. Path pending.   1 - Orthostasis, Acute on Chronic COPD/CHF, MIld PNA - Long h/o COPD on inhailers (refuses home O2) and some CHF on lasix with some orthostasis POD 0 after ureteroscopy. Held BP meds and lasix and BP / orthostasis much improved 2/2, but now c/o worsening SOB. Baseline O2 sats mid 80s on room air. CXR, CT, BNP 2/2 consistent with CHF exacerbation / mild PNA. Started on diuretics and ABX per hosp team.   Today "Kelly [name]" is improving. Much less work of breathing. Feels "a whole lot better", but not at baseline.    Objective: Vital signs in last 24 hours: Temp:  [98.5 F (36.9 C)-98.7 F (37.1 C)] 98.5 F (36.9 C) (02/03 0549) Pulse Rate:  [89-94] 94 (02/03 1152) Resp:  [18-26] 18 (02/03 0549) BP: (114-159)/(54-63) 114/54 (02/03 1152) SpO2:  [90 %-96 %] 94 % (02/03 0827) FiO2 (%):  [32 %] 32 % (02/03 0735) Last BM Date: 02/17/21  Intake/Output from previous day: 02/02 0701 - 02/03 0700 In: 374 [P.O.:120; I.V.:254] Out: 1410 [Urine:1410] Intake/Output this shift: Total I/O In: 240 [P.O.:240] Out: 250 [Urine:250]   General appearance: alert, cooperative. Much better spirits today.  Head: Normocephalic, without obvious abnormality, atraumatic Eyes: negative Nose: Nares normal. Septum midline. Mucosa normal. No drainage or sinus tenderness., On [place] O2.  Throat: lips, mucosa, and tongue normal; teeth and gums normal Neck: supple, symmetrical, trachea midline Resp: Much improved work of breathing, near but not yet at baseline.  Cardio: Nl rate Extremities: extremities normal, atraumatic, no cyanosis or edema and again, minimal edema.  Pulses: 2+ and symmetric Skin: Skin color, texture, turgor normal. No rashes or lesions Neurologic: Grossly normal  Lab Results:  Recent Labs    02/18/21 1446 02/19/21 0649   WBC 14.8* 11.9*  HGB 10.7* 10.5*  HCT 33.0* 32.4*  PLT 197 162   BMET Recent Labs    02/18/21 1446 02/19/21 0649  NA 133* 135  K 5.3* 3.9  CL 103 101  CO2 20* 21*  GLUCOSE 160* 183*  BUN 25* 29*  CREATININE 1.78* 1.52*  CALCIUM 8.7* 8.7*   PT/INR No results for input(s): LABPROT, INR in the last 72 hours. ABG No results for input(s): PHART, HCO3 in the last 72 hours.  Invalid input(s): PCO2, PO2  Studies/Results: CT CHEST WO CONTRAST  Result Date: 02/18/2021 CLINICAL DATA:  Asthma, acute EXAM: CT CHEST WITHOUT CONTRAST TECHNIQUE: Multidetector CT imaging of the chest was performed following the standard protocol without IV contrast. RADIATION DOSE REDUCTION: This exam was performed according to the departmental dose-optimization program which includes automated exposure control, adjustment of the mA and/or kV according to patient size and/or use of iterative reconstruction technique. COMPARISON:  Chest radiograph 02/18/2021 FINDINGS: Cardiovascular: Normal cardiac size.No pericardial disease.Mildly enlarged branch pulmonary arteries.Moderate to severe aortic arch atherosclerotic calcifications. Mediastinum/Nodes: No lymphadenopathy.The thyroid is unremarkable.Esophagus is unremarkable.The trachea is unremarkable. Lungs/Pleura: Diffuse mild bronchial wall thickening.There is interlobular septal thickening and ground-glass opacities bilaterally, most prominent in the upper lungs and right middle and lower lobes.There are small bilateral pleural effusions, right greater than left.Bibasilar consolidations and ground-glass opacities. No pneumothorax. Upper Abdomen: There is a partially visualized left nephroureteral stent. No acute abnormality. Musculoskeletal: Multilevel degenerative changes of the spine. No acute osseous abnormality.No suspicious lytic or blastic lesions. IMPRESSION: Mild pulmonary edema and small bilateral  pleural effusions with bibasilar consolidations and ground-glass  opacities, concerning for superimposed multifocal pneumonia. Aortic Atherosclerosis (ICD10-I70.0). Electronically Signed   By: Maurine Simmering M.D.   On: 02/18/2021 17:47   DG Chest Port 1 View  Result Date: 02/19/2021 CLINICAL DATA:  Shortness of breath EXAM: PORTABLE CHEST 1 VIEW COMPARISON:  Previous studies including the examination of 02/18/2021 FINDINGS: Transverse diameter of heart is increased. There are no signs of alveolar pulmonary edema. Low position of diaphragms suggests possible COPD. There is haziness in both lower lung fields suggesting bilateral pleural effusions. Possibility of underlying infiltrates is not excluded. There is slight prominence of interstitial markings in the parahilar regions with interval improvement in the left parahilar region. There is no pneumothorax. IMPRESSION: Cardiomegaly. Bilateral pleural effusions, more so on the right side. Possibility of underlying infiltrates is not excluded. There is slight improvement in aeration of left parahilar region, possibly suggesting decrease in interstitial edema. Electronically Signed   By: Elmer Picker M.D.   On: 02/19/2021 10:16   DG CHEST PORT 1 VIEW  Result Date: 02/18/2021 CLINICAL DATA:  COPD exacerbation (HCC) J44.1 (ICD-10-CM) EXAM: PORTABLE CHEST 1 VIEW COMPARISON:  09/05/2020. FINDINGS: Small left greater than right pleural effusions. Overlying bibasilar opacities. Mild interstitial prominence. No visible pneumothorax. Mild enlargement of the cardiac silhouette, similar. Calcific atherosclerosis of the aorta. IMPRESSION: 1. Small left greater than right pleural effusions. Overlying bibasilar opacities, which could represent atelectasis and/or pneumonia. 2. Mild interstitial prominence, which could represent the sequela of recurrent bouts of CHF versus mild interstitial edema. 3. Chronic cardiomegaly. Electronically Signed   By: Margaretha Sheffield M.D.   On: 02/18/2021 13:42   DG C-Arm 1-60 Min-No Report  Result  Date: 02/17/2021 Fluoroscopy was utilized by the requesting physician.  No radiographic interpretation.    Anti-infectives: Anti-infectives (From admission, onward)    Start     Dose/Rate Route Frequency Ordered Stop   02/18/21 1915  azithromycin (ZITHROMAX) tablet 500 mg        500 mg Oral  Once 02/18/21 1821 02/18/21 1833   02/18/21 1915  cefTRIAXone (ROCEPHIN) 1 g in sodium chloride 0.9 % 100 mL IVPB        1 g 200 mL/hr over 30 Minutes Intravenous  Once 02/18/21 1822 02/18/21 1910   02/17/21 1335  gentamicin (GARAMYCIN) 100 mg in dextrose 5 % 50 mL IVPB        100 mg 105 mL/hr over 30 Minutes Intravenous 30 min pre-op 02/17/21 1335 02/17/21 1611       Assessment/Plan:  1 - LEFT Ureteral Mass - estimate 98% resected with ureteroscopy, further management pending path results. Has JJ stent to stay for now. She is NOT operative candidate for major surgery.   2- Orthostasis, Acute on Chronic COPD / CHF - initial orthostaiss likely 2/2 BP meds and diuretics with NPO status, then some overload with corrective measures. Now improving with TX of her baseline COPD, CHF, and possible early PNA. Greatly appreciate hospitalist service comanagement.   Alexis Frock 02/19/2021

## 2021-02-19 NOTE — Progress Notes (Signed)
°  Echocardiogram 2D Echocardiogram has been performed.  Kelly [name] 02/19/2021, 1:24 PM

## 2021-02-19 NOTE — Progress Notes (Signed)
°   02/19/21 1400  Mobility  Activity Transferred to/from [facility];Transferred from bed to chair  Level of Assistance Minimal assist, patient does 75% or more  Assistive Device BSC  Activity Response Tolerated well  $Mobility charge 1 Mobility   Pt nervous about mobilizing this afternoon. However, pt requested to use BSC and sit up in the chair. When pt was transferred to [facility], noticed she voided in the bed. Changed bed sheets and cleaned up pt. Pt trasnfer from [facility] to the chair. Left pt in chair with call bell at side and respiratory present. RN notified of session.  [place] Specialist Acute Rehab Services Office: [phone]

## 2021-02-20 DIAGNOSIS — N1339 Other hydronephrosis: Secondary | ICD-10-CM

## 2021-02-20 LAB — BASIC METABOLIC PANEL
Anion gap: 11 (ref 5–15)
BUN: 28 mg/dL — ABNORMAL HIGH (ref 8–23)
CO2: 29 mmol/L (ref 22–32)
Calcium: 9.2 mg/dL (ref 8.9–10.3)
Chloride: 101 mmol/L (ref 98–111)
Creatinine, Ser: 1.21 mg/dL — ABNORMAL HIGH (ref 0.44–1.00)
GFR, Estimated: 45 mL/min — ABNORMAL LOW (ref >60)
Glucose, Bld: 134 mg/dL — ABNORMAL HIGH (ref 70–99)
Potassium: 3.2 mmol/L — ABNORMAL LOW (ref 3.5–5.1)
Sodium: 141 mmol/L (ref 135–145)

## 2021-02-20 LAB — ECHOCARDIOGRAM COMPLETE
Calc EF: 67.1 %
Height: 61 in
MV M vel: 7.19 m/s
MV Peak grad: 206.5 mmHg
S' Lateral: 2.2 cm
Single Plane A2C EF: 65.6 %
Single Plane A4C EF: 68.4 %
Weight: 1660.8 oz

## 2021-02-20 LAB — CBC
HCT: 33.7 % — ABNORMAL LOW (ref 36.0–46.0)
Hemoglobin: 11.1 g/dL — ABNORMAL LOW (ref 12.0–15.0)
MCH: 32.1 pg (ref 26.0–34.0)
MCHC: 32.9 g/dL (ref 30.0–36.0)
MCV: 97.4 fL (ref 80.0–100.0)
Platelets: 188 10*3/uL (ref 150–400)
RBC: 3.46 MIL/uL — ABNORMAL LOW (ref 3.87–5.11)
RDW: 15.1 % (ref 11.5–15.5)
WBC: 16.6 10*3/uL — ABNORMAL HIGH (ref 4.0–10.5)
nRBC: 0 % (ref 0.0–0.2)

## 2021-02-20 LAB — MAGNESIUM: Magnesium: 1.8 mg/dL (ref 1.7–2.4)

## 2021-02-20 LAB — PROCALCITONIN: Procalcitonin: <0.1 ng/mL

## 2021-02-20 MED ORDER — POTASSIUM CHLORIDE 20 MEQ PO PACK
40.0000 meq | PACK | Freq: Two times a day (BID) | ORAL | Status: AC
  Administered 2021-02-20 (×2): 40 meq via ORAL
  Filled 2021-02-20 (×2): qty 2

## 2021-02-20 MED ORDER — FUROSEMIDE 40 MG PO TABS
40.0000 mg | ORAL_TABLET | Freq: Every day | ORAL | Status: DC
  Administered 2021-02-20 – 2021-02-21 (×2): 40 mg via ORAL
  Filled 2021-02-20 (×2): qty 1

## 2021-02-20 MED ORDER — DILTIAZEM HCL ER COATED BEADS 180 MG PO CP24
180.0000 mg | ORAL_CAPSULE | Freq: Every day | ORAL | Status: DC
Start: 2021-02-20 — End: 2021-02-21
  Administered 2021-02-20 – 2021-02-21 (×2): 180 mg via ORAL
  Filled 2021-02-20 (×2): qty 1

## 2021-02-20 MED ORDER — UMECLIDINIUM BROMIDE 62.5 MCG/ACT IN AEPB
1.0000 | INHALATION_SPRAY | Freq: Every day | RESPIRATORY_TRACT | Status: DC
  Administered 2021-02-20 – 2021-02-21 (×2): 1 via RESPIRATORY_TRACT
  Filled 2021-02-20: qty 7

## 2021-02-20 MED ORDER — MOMETASONE FURO-FORMOTEROL FUM 200-5 MCG/ACT IN AERO
2.0000 | INHALATION_SPRAY | Freq: Two times a day (BID) | RESPIRATORY_TRACT | Status: DC
  Administered 2021-02-20 – 2021-02-21 (×3): 2 via RESPIRATORY_TRACT
  Filled 2021-02-20: qty 8.8

## 2021-02-20 MED ORDER — BENZONATATE 100 MG PO CAPS
100.0000 mg | ORAL_CAPSULE | Freq: Three times a day (TID) | ORAL | Status: DC
  Administered 2021-02-20 – 2021-02-21 (×4): 100 mg via ORAL
  Filled 2021-02-20 (×4): qty 1

## 2021-02-20 MED ORDER — TIOTROPIUM BROMIDE MONOHYDRATE 18 MCG IN CAPS: 18.0000 ug | ORAL_CAPSULE | Freq: Every day | RESPIRATORY_TRACT | Status: DC | PRN

## 2021-02-20 MED ORDER — METOPROLOL SUCCINATE ER 25 MG PO TB24
25.0000 mg | ORAL_TABLET | Freq: Every day | ORAL | Status: DC
  Administered 2021-02-20 – 2021-02-21 (×2): 25 mg via ORAL
  Filled 2021-02-20 (×2): qty 1

## 2021-02-20 NOTE — Progress Notes (Signed)
°   02/20/21 1200  Mobility  Activity Ambulated with assistance in room;Transferred to/from [facility];Transferred from bed to chair;Stood at bedside  Level of Assistance Contact guard assist, steadying assist  Assistive Device BSC  Activity Response Tolerated well  $Mobility charge 1 Mobility   Pt agreeable to mobilize this morning. She stated that she felt like she was leaking from her purewick. Had pt transfer from bed to [facility]. Bed was soiled. Changed sheets and gown, and wiped pt down with soap and water. Left pt in chair with call bell at side. Notified RN of session.  [place] Specialist Acute Rehab Services Office: [phone]

## 2021-02-20 NOTE — Progress Notes (Signed)
Pt without complaint. Up in the chair, back in bed. Voiding without difficulty. Urine red as expected, no clots.   On O2, echo looked OK as far as I can tell.   . Vitals:   02/20/21 1148 02/20/21 1427  BP: (!) 159/80   Pulse: (!) 109   Resp: 20   Temp: 97.6 F (36.4 C)   SpO2: 99% 97%    Intake/Output Summary (Last 24 hours) at 02/20/2021 1434 Last data filed at 02/20/2021 0600 Gross per 24 hour  Intake 290 ml  Output 900 ml  Net -610 ml   Looks well - oriented NAD Red urine - pure wick   CBC    Component Value Date/Time   WBC 16.6 (H) 02/20/2021 1014   RBC 3.46 (L) 02/20/2021 1014   HGB 11.1 (L) 02/20/2021 1014   HCT 33.7 (L) 02/20/2021 1014   PLT 188 02/20/2021 1014   MCV 97.4 02/20/2021 1014   MCH 32.1 02/20/2021 1014   MCHC 32.9 02/20/2021 1014   RDW 15.1 02/20/2021 1014   LYMPHSABS 0.5 (L) 02/18/2021 1446   MONOABS 1.2 (H) 02/18/2021 1446   EOSABS 0.0 02/18/2021 1446   BASOSABS 0.0 02/18/2021 1446   CBC    Component Value Date/Time   WBC 16.6 (H) 02/20/2021 1014   RBC 3.46 (L) 02/20/2021 1014   HGB 11.1 (L) 02/20/2021 1014   HCT 33.7 (L) 02/20/2021 1014   PLT 188 02/20/2021 1014   MCV 97.4 02/20/2021 1014   MCH 32.1 02/20/2021 1014   MCHC 32.9 02/20/2021 1014   RDW 15.1 02/20/2021 1014   LYMPHSABS 0.5 (L) 02/18/2021 1446   MONOABS 1.2 (H) 02/18/2021 1446   EOSABS 0.0 02/18/2021 1446   BASOSABS 0.0 02/18/2021 1446  BMET    Component Value Date/Time   NA 141 02/20/2021 1014   K 3.2 (L) 02/20/2021 1014   CL 101 02/20/2021 1014   CO2 29 02/20/2021 1014   GLUCOSE 134 (H) 02/20/2021 1014   BUN 28 (H) 02/20/2021 1014   CREATININE 1.21 (H) 02/20/2021 1014   CALCIUM 9.2 02/20/2021 1014   GFRNONAA 45 (L) 02/20/2021 1014   A/P: 1 - LEFT Ureteral Mass - estimate 98% resected with ureteroscopy, further management pending path results. Has JJ stent to stay for now. She is NOT operative candidate for major surgery.    2- Orthostasis, Acute on Chronic COPD  / CHF - initial orthostaiss likely 2/2 BP meds and diuretics with NPO status, then some overload with corrective measures. Now improving with TX of her baseline COPD, CHF, and possible early PNA. On O2 and BP up today - resume home meds. Greatly appreciate hospitalist service comanagement.   Home when medically stable.

## 2021-02-20 NOTE — Progress Notes (Addendum)
PROGRESS NOTE    Kelly [name]  NKN:[id number] DOB: [birth year] DOA: 02/17/2021 PCP: Joya Gaskins, FNP   Brief Narrative:  Kelly [name], 81 y.o. female  COPD, HFpEF, GAD, PAF. Presenting with a left ureteral mass s/p left ureteroscopy 2/1,had some orthostatic hypotension post procedure, so she was placed in ON obs and given fluids. Her BP/HF meds were held.  2/2 am was more short of breath.She reports to the primary team that at baseline her sats are in the 80's and she has had recommendations for supplemental O2 in the past, but she has refused them. She denies CP, fever, cough. With persistent hypoxia/dyspnea, urology consulted [place] for assistance.  Underwent CT chest 2/2 that showed mild pulmonary edema, small bilateral pleural effusion with bibasilar consolidations and groundglass opacities concerning for superimposed multifocal pneumonia.  Further work-up showed negative/normal procalcitonin but significantly elevated BNP 1747, negative troponin.  Patient was placed on diuretics  Assessment & Plan:  Left ureteral mass status post ureteroscopy :  -98% resected with ureteroscopy.  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  Moderately LVH, moderate pulmonary hypertension.  BNP elevated at 1747.8 -Chest x-ray repeated on 02/19/2021 shows cardiomegaly bilateral pleural effusion more on the right, improvement in aeration of the left perihilar region -will switch IV lasix to PO today.  Continue digoxin.  Procalcitonin: <0.10.  Respiratory panel: Negative.  COVID/RSV/flu: Negative.  We will hold off antibiotics at this  time. -Monitor intake output Daily weight, follow-up echocardiogram.   -She is followed by Dr. Einar Gip as outpatient  Panlobular emphysema COPD: -Currently not wheezing, continue bronchodilators. -Chronic hypoxic aspiratory failure it appears patient was recommended oxygen but she has refused.   -On 2 L oxygen via nasal cannula. -Resume home inhalers.  Added Tessalon Perles for the cough  -consult TOC to help arrange home O2   PAF -Resume Eliquis if okay with urology- per Dr Tresa Moore could resume Sunday or monday.  -Resume diltiazem and metoprolol today since her heart rate runs in 110s. -Monitor on telemetry  Hypertension: -BP is elevated today.  We will resume home medications-metoprolol and diltiazem. -Monitor closely   AKI on CKD IIIa: creat 1.2 on 1/27.23 -Creatinine improving with diuresis  -monitor closely   Leukocytosis likely reactive postop.  Did receive antibiotics 2/2 procalcitonin reassuring.  She is afebrile.   Hyperlipidemia: Continue statin  GERD: Continue PPI  Hypokalemia: Replenished.  Check magnesium level.  Repeat BMP tomorrow a.m.  Anemia of chronic disease: H&H is currently stable.  Continue to monitor  Tobacco abuse cessation discussed. -Nicotine patch  DVT prophylaxis: SCD Code Status: Full code Family Communication:  None present at bedside.  Plan of care discussed with patient in length and she verbalized understanding and agreed with it. Disposition Plan: To be determined  Consultants:  Urology  Procedures:  Ureteroscopy with JJ stent placement  Antimicrobials:  None  Status is: Inpatient     Subjective: Patient seen and examined.  Reports productive cough with white/clear sputum since 1 AM.  No shortness of breath, wheezing, chest pain, palpitations.  No fever, chills.  No acute events overnight.  objective: Vitals:   02/20/21 0532 02/20/21 0800 02/20/21 0842 02/20/21 1148  BP: (!) 157/66  (!) 154/78 (!) 159/80  [name]: (!) 108   (!) 115 (!) 109  Resp: 16   20  Temp: 98.4 F (36.9 C)   97.6 F (36.4 C)  TempSrc: Oral   Oral  SpO2: 93% 94% 94% 99%  Weight:      Height:        Intake/Output Summary (Last 24 hours) at 02/20/2021 1411 Last data filed at 02/20/2021 0600 Gross per 24 hour  Intake 290 ml  Output 900 ml  Net -610 ml   Filed Weights   02/18/21 0100  Weight: 47.1 kg    Examination:  General exam: Appears calm and comfortable, elderly female, on nasal cannula, communicating well, sitting comfortably on the bed Respiratory system: Crackles at left lower lobe.  No use of accessory muscles.  No wheezing Cardiovascular system: S1 & S2 heard, RRR. No JVD, murmurs, rubs, gallops or clicks. No pedal edema. Gastrointestinal system: Abdomen is nondistended, soft and nontender. No organomegaly or masses felt. Normal bowel sounds heard. Central nervous system: Alert and oriented. No focal neurological deficits. Extremities: Symmetric 5 x 5 power. Skin: No rashes, lesions or ulcers Psychiatry: Judgement and insight appear normal. Mood & affect appropriate.    Data Reviewed: I have personally reviewed following labs and imaging studies  CBC: Recent Labs  Lab 02/18/21 1446 02/19/21 0649 02/20/21 1014  WBC 14.8* 11.9* 16.6*  NEUTROABS 13.0*  --   --   HGB 10.7* 10.5* 11.1*  HCT 33.0* 32.4* 33.7*  MCV 100.0 99.1 97.4  PLT 197 162 885   Basic Metabolic Panel: Recent Labs  Lab 02/18/21 1446 02/19/21 0649 02/20/21 1014  NA 133* 135 141  K 5.3* 3.9 3.2*  CL 103 101 101  CO2 20* 21* 29  GLUCOSE 160* 183* 134*  BUN 25* 29* 28*  CREATININE 1.78* 1.52* 1.21*  CALCIUM 8.7* 8.7* 9.2   GFR: Estimated Creatinine Clearance: 27.1 mL/min (A) (by C-G formula based on SCr of 1.21 mg/dL (H)). Liver Function Tests: Recent Labs  Lab 02/18/21 1446  AST 42*  ALT 25  ALKPHOS 52  BILITOT 0.3  PROT 6.8  ALBUMIN 4.2   No results for input(s): LIPASE, AMYLASE in the last 168 hours. No results for  input(s): AMMONIA in the last 168 hours. Coagulation Profile: No results for input(s): INR, PROTIME in the last 168 hours. Cardiac Enzymes: No results for input(s): CKTOTAL, CKMB, CKMBINDEX, TROPONINI in the last 168 hours. BNP (last 3 results) No results for input(s): PROBNP in the last 8760 hours. HbA1C: No results for input(s): HGBA1C in the last 72 hours. CBG: No results for input(s): GLUCAP in the last 168 hours. Lipid Profile: No results for input(s): CHOL, HDL, LDLCALC, TRIG, CHOLHDL, LDLDIRECT in the last 72 hours. Thyroid Function Tests: No results for input(s): TSH, T4TOTAL, FREET4, T3FREE, THYROIDAB in the last 72 hours. Anemia Panel: No results for input(s): VITAMINB12, FOLATE, FERRITIN, TIBC, IRON, RETICCTPCT in the last 72 hours. Sepsis Labs: Recent Labs  Lab 02/18/21 1446 02/19/21 0336 02/20/21 0408  PROCALCITON <0.10 0.10 <0.10    Recent Results (from the past 240 hour(s))  Resp Panel by RT-PCR (Flu A&B, Covid) Nasopharyngeal Swab     Status: None   Collection Time: 02/18/21  6:29 PM   Specimen: Nasopharyngeal Swab; Nasopharyngeal(NP) swabs in vial transport medium  Result Value Ref Range Status   SARS Coronavirus 2 by RT PCR NEGATIVE NEGATIVE Final    Comment: (NOTE) SARS-CoV-2 target nucleic acids are NOT  DETECTED.  The SARS-CoV-2 RNA is generally detectable in upper respiratory specimens during the acute phase of infection. The lowest concentration of SARS-CoV-2 viral copies this assay can detect is 138 copies/mL. A negative result does not preclude SARS-Cov-2 infection and should not be used as the sole basis for treatment or other patient management decisions. A negative result may occur with  improper specimen collection/handling, submission of specimen other than nasopharyngeal swab, presence of viral mutation(s) within the areas targeted by this assay, and inadequate number of viral copies(<138 copies/mL). A negative result must be combined  with clinical observations, patient history, and epidemiological information. The expected result is Negative.  Fact Sheet for Patients:  EntrepreneurPulse.com.au  Fact Sheet for Healthcare Providers:  IncredibleEmployment.be  This test is no t yet approved or cleared by the [place] FDA and  has been authorized for detection and/or diagnosis of SARS-CoV-2 by FDA under an Emergency Use Authorization (EUA). This EUA will remain  in effect (meaning this test can be used) for the duration of the COVID-19 declaration under Section 564(b)(1) of the Act, 21 U.S.C.section 360bbb-3(b)(1), unless the authorization is terminated  or revoked sooner.       Influenza A by PCR NEGATIVE NEGATIVE Final   Influenza B by PCR NEGATIVE NEGATIVE Final    Comment: (NOTE) The Xpert Xpress SARS-CoV-2/FLU/RSV plus assay is intended as an aid in the diagnosis of influenza from Nasopharyngeal swab specimens and should not be used as a sole basis for treatment. Nasal washings and aspirates are unacceptable for Xpert Xpress SARS-CoV-2/FLU/RSV testing.  Fact Sheet for Patients: EntrepreneurPulse.com.au  Fact Sheet for Healthcare Providers: IncredibleEmployment.be  This test is not yet approved or cleared by the [place] FDA and has been authorized for detection and/or diagnosis of SARS-CoV-2 by FDA under an Emergency Use Authorization (EUA). This EUA will remain in effect (meaning this test can be used) for the duration of the COVID-19 declaration under Section 564(b)(1) of the Act, 21 U.S.C. section 360bbb-3(b)(1), unless the authorization is terminated or revoked.  Performed at [facility], [place] [address], [place], [place] [address]   Respiratory (~20 pathogens) panel by PCR     Status: None   Collection Time: 02/18/21  6:29 PM   Specimen: Nasopharyngeal Swab; Respiratory  Result Value Ref Range  Status   Adenovirus NOT DETECTED NOT DETECTED Final   Coronavirus 229E NOT DETECTED NOT DETECTED Final    Comment: (NOTE) The Coronavirus on the Respiratory Panel, DOES NOT test for the novel  Coronavirus (2019 nCoV)    Coronavirus HKU1 NOT DETECTED NOT DETECTED Final   Coronavirus NL63 NOT DETECTED NOT DETECTED Final   Coronavirus OC43 NOT DETECTED NOT DETECTED Final   Metapneumovirus NOT DETECTED NOT DETECTED Final   Rhinovirus / Enterovirus NOT DETECTED NOT DETECTED Final   Influenza A NOT DETECTED NOT DETECTED Final   Influenza B NOT DETECTED NOT DETECTED Final   Parainfluenza Virus 1 NOT DETECTED NOT DETECTED Final   Parainfluenza Virus 2 NOT DETECTED NOT DETECTED Final   Parainfluenza Virus 3 NOT DETECTED NOT DETECTED Final   Parainfluenza Virus 4 NOT DETECTED NOT DETECTED Final   Respiratory Syncytial Virus NOT DETECTED NOT DETECTED Final   Bordetella pertussis NOT DETECTED NOT DETECTED Final   Bordetella Parapertussis NOT DETECTED NOT DETECTED Final   Chlamydophila pneumoniae NOT DETECTED NOT DETECTED Final   Mycoplasma pneumoniae NOT DETECTED NOT DETECTED Final    Comment: Performed at [facility], [place]. [place],  [place] [address]      Radiology Studies: CT CHEST WO CONTRAST  Result Date: 02/18/2021 CLINICAL DATA:  Asthma, acute EXAM: CT CHEST WITHOUT CONTRAST TECHNIQUE: Multidetector CT imaging of the chest was performed following the standard protocol without IV contrast. RADIATION DOSE REDUCTION: This exam was performed according to the departmental dose-optimization program which includes automated exposure control, adjustment of the mA and/or kV according to patient size and/or use of iterative reconstruction technique. COMPARISON:  Chest radiograph 02/18/2021 FINDINGS: Cardiovascular: Normal cardiac size.No pericardial disease.Mildly enlarged branch pulmonary arteries.Moderate to severe aortic arch atherosclerotic calcifications. Mediastinum/Nodes: No  lymphadenopathy.The thyroid is unremarkable.Esophagus is unremarkable.The trachea is unremarkable. Lungs/Pleura: Diffuse mild bronchial wall thickening.There is interlobular septal thickening and ground-glass opacities bilaterally, most prominent in the upper lungs and right middle and lower lobes.There are small bilateral pleural effusions, right greater than left.Bibasilar consolidations and ground-glass opacities. No pneumothorax. Upper Abdomen: There is a partially visualized left nephroureteral stent. No acute abnormality. Musculoskeletal: Multilevel degenerative changes of the spine. No acute osseous abnormality.No suspicious lytic or blastic lesions. IMPRESSION: Mild pulmonary edema and small bilateral pleural effusions with bibasilar consolidations and ground-glass opacities, concerning for superimposed multifocal pneumonia. Aortic Atherosclerosis (ICD10-I70.0). Electronically Signed   By: Maurine Simmering M.D.   On: 02/18/2021 17:47   DG Chest Port 1 View  Result Date: 02/19/2021 CLINICAL DATA:  Shortness of breath EXAM: PORTABLE CHEST 1 VIEW COMPARISON:  Previous studies including the examination of 02/18/2021 FINDINGS: Transverse diameter of heart is increased. There are no signs of alveolar pulmonary edema. Low position of diaphragms suggests possible COPD. There is haziness in both lower lung fields suggesting bilateral pleural effusions. Possibility of underlying infiltrates is not excluded. There is slight prominence of interstitial markings in the parahilar regions with interval improvement in the left parahilar region. There is no pneumothorax. IMPRESSION: Cardiomegaly. Bilateral pleural effusions, more so on the right side. Possibility of underlying infiltrates is not excluded. There is slight improvement in aeration of left parahilar region, possibly suggesting decrease in interstitial edema. Electronically Signed   By: Elmer Picker M.D.   On: 02/19/2021 10:16   ECHOCARDIOGRAM  COMPLETE  Result Date: 02/20/2021    ECHOCARDIOGRAM REPORT   Patient Name:   CLARITY CISZEK Date of Exam: 02/19/2021 Medical Rec #:  [id number]      Height:       61.0 in Accession #:    [id number]     Weight:       103.8 lb Date of Birth:  Feb 22, 1940      BSA:          1.429 m Patient Age:    23 years       BP:           152/62 mmHg Patient Gender: F              HR:           98 bpm. Exam Location:  Inpatient Procedure: 2D Echo, Cardiac Doppler and Color Doppler Indications:     CHF-Acute Diastolic E99.37  History:         Patient has prior history of Echocardiogram examinations, most                  recent 06/18/2020. CHF, COPD; Risk Factors:Hypertension.  Sonographer:     Bernadene Person RDCS Referring Phys:  [id number] Jonnie Finner Diagnosing Phys: Vernell Leep MD IMPRESSIONS  1. Left ventricular ejection fraction, by estimation, is 65 to 70%. The left ventricle  has hyperdynamic function. The left ventricle has no regional wall motion abnormalities. There is moderate left ventricular hypertrophy. Left ventricular diastolic parameters are indeterminate. Increased LVOT velocity (Vmax 11 mmHg) due to mild LVOT obstruction secondary to hyperdynamic LV. 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  2. Right ventricular systolic function is normal. The right ventricular size is normal. There is moderately elevated pulmonary artery systolic pressure. Estimated PASP 47 mmHg.  3. Left atrial size was severely dilated.  4. The mitral valve is normal in structure. Moderate mitral valve regurgitation. No evidence of mitral stenosis.  5. The aortic valve is normal in structure. Aortic valve regurgitation is not visualized. No aortic stenosis is present.  6. The inferior vena cava is normal in size with <50% respiratory variability, suggesting right atrial pressure of 8 mmHg.  7. Compared to previous outpatient study on 06/17/2020, tricuspid regurgitation and pulmonary hypertension are new findings. LVH is  No other significant change noted. FINDINGS  Left Ventricle: Increased LVOT velocity (Vmax 11 mmHg) due to mild LVOT obstruction secondary to hyperdynamic LV. 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 Left ventricular ejection fraction, by estimation, is 65 to 70%. The left  ventricle has hyperdynamic function. The left ventricle has no regional wall motion abnormalities. The left ventricular internal cavity size was normal in size. There is moderate left ventricular hypertrophy. Left ventricular diastolic parameters are indeterminate. Right Ventricle: The right ventricular size is normal. No increase in right ventricular wall thickness. Right ventricular systolic function is normal. There is moderately elevated pulmonary artery systolic pressure. The tricuspid regurgitant velocity is 3.13 m/s, and with an assumed right atrial pressure of 8 mmHg, the estimated right ventricular systolic pressure is 60.1 mmHg. Left Atrium: Left atrial size was severely dilated. Right Atrium: Right atrial size was normal in size. Pericardium: There is no evidence of pericardial effusion. Mitral Valve: The mitral valve is normal in structure. Moderate mitral valve regurgitation. No evidence of mitral valve stenosis. Tricuspid Valve: The tricuspid valve is normal in structure. Tricuspid valve regurgitation is mild . No evidence of tricuspid stenosis. Aortic Valve: The aortic valve is normal in structure. Aortic valve regurgitation is not visualized. No aortic stenosis is present. Pulmonic Valve: The pulmonic valve was normal in structure. Pulmonic valve regurgitation is not visualized. No evidence of pulmonic stenosis. Aorta: The aortic root is normal in size and structure. Venous: The inferior vena cava is normal in size with less than 50% respiratory variability, suggesting right atrial pressure of 8 mmHg. IAS/Shunts: The interatrial septum was not assessed.  LEFT VENTRICLE PLAX 2D LVIDd:         3.60 cm LVIDs:          2.20 cm LV PW:         1.20 cm LV IVS:        1.20 cm LVOT diam:     1.90 cm LV SV:         108 LV SV Index:   75 LVOT Area:     2.84 cm  LV Volumes (MOD) LV vol d, MOD A2C: 70.6 ml LV vol d, MOD A4C: 57.3 ml LV vol s, MOD A2C: 24.3 ml LV vol s, MOD A4C: 18.1 ml LV SV MOD A2C:     46.3 ml LV SV MOD A4C:     57.3 ml LV SV MOD BP:      44.6 ml RIGHT VENTRICLE RV S prime:     12.40 cm/s TAPSE (M-mode): 3.2 cm LEFT ATRIUM  Index        RIGHT ATRIUM           Index LA diam:        4.30 cm 3.01 cm/m   RA Area:     14.40 cm LA Vol (A2C):   81.9 ml 57.30 ml/m  RA Volume:   29.80 ml  20.85 ml/m LA Vol (A4C):   70.3 ml 49.19 ml/m LA Biplane Vol: 82.3 ml 57.58 ml/m  AORTIC VALVE LVOT Vmax:   164.00 cm/s LVOT Vmean:  124.000 cm/s LVOT VTI:    0.380 m  AORTA Ao Root diam: 3.40 cm Ao Asc diam:  3.60 cm MR Peak grad: 206.5 mmHg  TRICUSPID VALVE MR Mean grad: 125.0 mmHg  TR Peak grad:   39.2 mmHg MR Vmax:      718.50 cm/s TR Vmax:        313.00 cm/s MR Vmean:     531.0 cm/s                           SHUNTS                           Systemic VTI:  0.38 m                           Systemic Diam: 1.90 cm Manish Patwardhan MD Electronically signed by Vernell Leep MD Signature Date/Time: 02/20/2021/9:00:23 AM    Final     Scheduled Meds:  atorvastatin  40 mg Oral QHS   benzonatate  100 mg Oral TID   digoxin  0.125 mg Oral Daily   DULoxetine  60 mg Oral Daily   furosemide  40 mg Intravenous BID   ipratropium-albuterol  3 mL Nebulization Q6H   mometasone-formoterol  2 puff Inhalation BID   nicotine  21 mg Transdermal Daily   pantoprazole  40 mg Oral Daily   pregabalin  50 mg Oral BID   senna-docusate  1 tablet Oral BID   umeclidinium bromide  1 puff Inhalation Daily   Continuous Infusions:   LOS: 1 day   Time spent: 35 minutes   Gilman Olazabal Loann Quill, MD [facility]  If 7PM-7AM, please contact night-coverage [email or web address] 02/20/2021, 2:11 PM

## 2021-02-20 NOTE — Progress Notes (Signed)
°   02/19/21 2207  Level of Consciousness  Level of Consciousness Alert  MEWS COLOR  MEWS Score Color Green  Pain Assessment  Pain Scale 0-10  Pain Score 0 (no chest pain)  ECG Monitoring  CV Strip Heart Rate 162  Cardiac Rhythm NSR;SVT Lorriane Shire RN)  Ectopy PAC  MEWS Score  MEWS Temp 0  MEWS Systolic 0  MEWS Pulse 1  MEWS RR 0  MEWS LOC 0  MEWS Score 1  Provider Notification  Provider Name/Title Micheal E. Norins, MD  Date Provider Notified 02/19/21  Time Provider Notified 2245  Notification Type Page (Secure Chat)  Notification Reason Other (Comment) (pt has episode of SVT 140-167, non sustain.)  Provider response No new orders (continue to monitor)  Date of Provider Response 02/19/21  Time of Provider Response 2255   Patient alert, comfortably resting in bed and will continue plan of care.

## 2021-02-21 DIAGNOSIS — J441 Chronic obstructive pulmonary disease with (acute) exacerbation: Secondary | ICD-10-CM

## 2021-02-21 DIAGNOSIS — J9601 Acute respiratory failure with hypoxia: Secondary | ICD-10-CM

## 2021-02-21 LAB — CBC
HCT: 33.5 % — ABNORMAL LOW (ref 36.0–46.0)
Hemoglobin: 10.9 g/dL — ABNORMAL LOW (ref 12.0–15.0)
MCH: 31.9 pg (ref 26.0–34.0)
MCHC: 32.5 g/dL (ref 30.0–36.0)
MCV: 98 fL (ref 80.0–100.0)
Platelets: 192 10*3/uL (ref 150–400)
RBC: 3.42 MIL/uL — ABNORMAL LOW (ref 3.87–5.11)
RDW: 15.2 % (ref 11.5–15.5)
WBC: 9.7 10*3/uL (ref 4.0–10.5)
nRBC: 0 % (ref 0.0–0.2)

## 2021-02-21 LAB — BASIC METABOLIC PANEL
Anion gap: 10 (ref 5–15)
BUN: 23 mg/dL (ref 8–23)
CO2: 33 mmol/L — ABNORMAL HIGH (ref 22–32)
Calcium: 8.9 mg/dL (ref 8.9–10.3)
Chloride: 97 mmol/L — ABNORMAL LOW (ref 98–111)
Creatinine, Ser: 1.06 mg/dL — ABNORMAL HIGH (ref 0.44–1.00)
GFR, Estimated: 53 mL/min — ABNORMAL LOW (ref >60)
Glucose, Bld: 118 mg/dL — ABNORMAL HIGH (ref 70–99)
Potassium: 3.7 mmol/L (ref 3.5–5.1)
Sodium: 140 mmol/L (ref 135–145)

## 2021-02-21 MED ORDER — IPRATROPIUM-ALBUTEROL 0.5-2.5 (3) MG/3ML IN SOLN
3.0000 mL | Freq: Two times a day (BID) | RESPIRATORY_TRACT | Status: DC
  Filled 2021-02-21 (×2): qty 3

## 2021-02-21 NOTE — Discharge Summary (Addendum)
Physician Discharge Summary  Patient ID: Kelly [name] MRN: [id number] DOB/AGE: 1940-08-16 81 y.o.  Admit date: 02/17/2021 Discharge date: 02/21/2021  Admission Diagnoses: left ureteral mass    Discharge Diagnoses:  Principal Problem:   Hydronephrosis Active Problems:   Chronic obstructive pulmonary disease (HCC)   Severe concentric left ventricular hypertrophy   Panlobular emphysema (HCC)   Paroxysmal atrial fibrillation (HCC)   Acute respiratory failure with hypoxia (HCC)   Primary hypertension   Dyspnea   Acute on chronic diastolic CHF (congestive heart failure) ([place]) Left ureteral mass  Discharged Condition: [place] Hospital Course:  Patient was admitted following left ureteroscopy biopsy and ureteral stent placement.  She was admitted due to hypoxia and hypotension after this typical outpatient procedure.  Hospitalist was consulted and she was placed on oxygen. Underwent CT chest 2/2 that showed mild pulmonary edema, small bilateral pleural effusion with bibasilar consolidations and groundglass opacities concerning for superimposed multifocal pneumonia.  Further work-up showed negative/normal procalcitonin but significantly elevated BNP 1747, negative troponin.  Patient was placed on diuretics. Echocardiogram appeared normal or at baseline.  Her home meds were restarted postop day 3 and she was ready for discharge postop day 4 on oxygen.  Consults:  [facility]  Significant Diagnostic Studies: Echocardiogram, CT of chest  Treatments: surgery:   PROCEDURE:  Procedure(s): CYSTOSCOPY WITH BILATERAL RETROGRADE PYELOGRAM, LEFT URETEROSCOPY , BIOPSY AND POSSIBLE STENT PLACEMENT (Bilateral) HOLMIUM LASER APPLICATION (Left)  Discharge Exam: Blood pressure (!) 143/60, pulse 85, temperature 97.7 F (36.5 C), temperature source Oral, resp. rate 16, height 5\' 1"  (1.549 m), weight 44.9 kg, SpO2 95 %. She looks well in bed, watching TV  Cardiovascular-regular rate and  rhythm Respiratory-on nasal cannula.  Breathing is clear.  Regular effort and depth.  No use of accessory muscles. Abdomen-soft and nontender Extremities-no calf pain or swelling  Disposition: Discharge disposition: 01-Home or Self Care     Current Medication List-Note: Take only these medications. Stop taking all medications not included on this list. If you have any questions regarding medications not on this list, please follow up with your healthcare provider. Bring this list to your next appointment. Current Medication List-Note: Take only these medications. Stop taking all medications not included on this list. If you have any questions regarding medications not on this list, please follow up with your healthcare provider. Bring this list to your next appointment.    Medication Details Next Dose Due Morning Afternoon Evening Bedtime As Needed   albuterol 108 (90 Base) MCG/ACT inhaler Commonly known as: VENTOLIN HFA Inhale 1 puff into the lungs every 6 (six) hours as needed for wheezing or shortness of breath.          apixaban 2.5 MG Tabs tablet Commonly known as: ELIQUIS Take 1 tablet (2.5 mg total) by mouth 2 (two) times daily.   02/22/21       atorvastatin 40 MG tablet Commonly known as: LIPITOR Take 1 tablet (40 mg total) by mouth at bedtime.      02/21/21    b complex vitamins tablet Take 1 tablet by mouth daily.   02/22/21       budesonide-formoterol 160-4.5 MCG/ACT inhaler Commonly known as: SYMBICORT Inhale 2 puffs into the lungs 2 (two) times daily as needed (shortness of breath or wheezing).          digoxin 0.125 MG tablet Commonly known as: LANOXIN TAKE 1 TABLET(0.125 MG) BY MOUTH DAILY   02/22/21       diltiazem 180  MG 24 hr capsule Commonly known as: CARDIZEM CD Take 180 mg by mouth daily.   02/22/21       DULoxetine 60 MG capsule Commonly known as: CYMBALTA Take 60 mg by mouth daily.   02/22/21       EPINEPHrine 0.3 mg/0.3 mL Soaj injection Commonly known as:  EPI-PEN Inject 0.3 mg into the muscle as needed for anaphylaxis.          ferrous sulfate 325 (65 FE) MG EC tablet Take 1 tablet (325 mg total) by mouth daily with breakfast. For: Anemia From Inadequate Iron in the Body   05/22/41       folic acid 1 MG tablet Commonly known as: FOLVITE Take 1 mg by mouth daily.   02/22/21       furosemide 20 MG tablet Commonly known as: LASIX Take 20 mg by mouth daily.   02/22/21       metoprolol succinate 25 MG 24 hr tablet Commonly known as: TOPROL-XL Take 25 mg by mouth daily.   02/22/21       Nexlizet 180-10 MG Tabs Take 1 tablet by mouth daily. Generic drug: Bempedoic Acid-Ezetimibe   02/22/21       nicotine 21 mg/24hr patch Commonly known as: NICODERM CQ - dosed in mg/24 hours Place 1 patch (21 mg total) onto the skin daily.   02/22/21       pantoprazole 40 MG tablet Commonly known as: PROTONIX Take 40 mg by mouth daily.   02/22/21       pregabalin 50 MG capsule Commonly known as: LYRICA Take 50 mg by mouth 2 (two) times daily.   02/22/21       Repatha 140 MG/ML Sosy INJECT 1ML UNDER THE SKIN EVERY 14 DAYS AS DIRECTED Generic drug: Evolocumab  Resume prior hospitalization regimen         tiotropium 18 MCG inhalation capsule Commonly known as: SPIRIVA Place 18 mcg into inhaler and inhale daily as needed (shortness of breath).         Icon medications to start taking   traMADol 50 MG tablet Commonly known as: Ultram Take 1 tablet (50 mg total) by mouth every 6 (six) hours as needed for moderate pain or severe pain (post-operatively).          Trelegy Ellipta 100-62.5-25 MCG/ACT Aepb Take 1 puff by mouth daily as needed (shortness of breath or wheezing). Generic drug: [place] by Does not apply route.           Discharge Instructions     Discharge patient   Complete by: As directed    Discharge disposition: 01-Home or Self Care   Discharge patient date: 02/21/2021         Follow-up  Information     Alexis Frock, MD Follow up on 03/09/2021.   Specialty: Urology Why: at 9:15 for MD visit Contact information: [facility] [place] [address] [phone]                 Signed: Festus Aloe 02/21/2021, 11:52 AM

## 2021-02-21 NOTE — Progress Notes (Signed)
PROGRESS NOTE    Glorianna Gott  VVO:[id number] DOB: October 31, 1940 DOA: 02/17/2021 PCP: Joya Gaskins, FNP   Brief Narrative:  [name], 81 y.o. female  COPD, HFpEF, GAD, PAF. Presenting with a left ureteral mass s/p left ureteroscopy 2/1,had some orthostatic hypotension post procedure, so she was placed in ON obs and given fluids. Her BP/HF meds were held.  2/2 am was more short of breath.She reports to the primary team that at baseline her sats are in the 80's and she has had recommendations for supplemental O2 in the past, but she has refused them. She denies CP, fever, cough. With persistent hypoxia/dyspnea, urology consulted [place] for assistance.  Underwent CT chest 2/2 that showed mild pulmonary edema, small bilateral pleural effusion with bibasilar consolidations and groundglass opacities concerning for superimposed multifocal pneumonia.  Further work-up showed negative/normal procalcitonin but significantly elevated BNP 1747, negative troponin.  Patient was placed on diuretics.  Her symptoms improved significantly and comfortable going home today  Assessment & Plan:  Left ureteral mass status post ureteroscopy :  -98% resected with ureteroscopy.  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  Moderately LVH, moderate pulmonary hypertension.  BNP elevated at 1747.8 -Chest x-ray repeated on 02/19/2021 shows cardiomegaly bilateral pleural effusion more on the right, improvement in aeration of the left perihilar region -cont. PO lasix  Continue digoxin.  Procalcitonin: <0.10.  Respiratory panel: Negative.  COVID/RSV/flu:  Negative.   -Follow-up with cardiology Dr. Einar Gip outpatient. -Stable to go home from medical standpoint.  Panlobular emphysema COPD: -Currently not wheezing, continue bronchodilators. -On 2 L oxygen via nasal cannula. -Continue home inhalers at discharge. -Arranged home oxygen for the patient.  PAF -Continue Eliquis at discharge -Continued diltiazem and metoprolol.  Blood pressure and heart rate remained stable overnight.  Hypertension: -Stable this morning.  Continue home medication   AKI on CKD IIIa: creat 1.2 on 1/27.23 -Creatinine improving with diuresis  -Improving.   Leukocytosis: Resolved.  Patient remained afebrile.    Hyperlipidemia: Continue statin  GERD: Continue PPI  Hypokalemia: Resolved  Anemia of chronic disease: H&H is stable.  Continue to monitor  Tobacco abuse cessation discussed. -Nicotine patch  DVT prophylaxis: SCD Code Status: Full code Family Communication:  None present at bedside.  Plan of care discussed with patient in length and she verbalized understanding and agreed with it.  I called patient's daughter and discussed plan of care and she verbalized understanding.  Disposition Plan: home  Procedures:  Ureteroscopy with JJ stent placement  Antimicrobials:  None  Status is: Inpatient     Subjective: Patient seen and examined.  Resting comfortably on the bed.  Reports that she feels much better, breathing has improved.  No fever overnight.  Cough is better.  Comfortable going home today.  objective: Vitals:   02/21/21 0500 02/21/21 0830 02/21/21 0950 02/21/21 1039  BP:   (!) 110/58 (!) 143/60  Pulse:  (!) 102  85  Resp:    16  Temp:    97.7 F (36.5 C)  TempSrc:    Oral  SpO2:    95%  Weight: 44.9 kg     Height:  Intake/Output Summary (Last 24 hours) at 02/21/2021 1123 Last data filed at 02/21/2021 0700 Gross per 24 hour  Intake --  Output 1350 ml  Net -1350 ml    Filed Weights   02/18/21 0100 02/21/21 0500   Weight: 47.1 kg 44.9 kg    Examination:  General exam: Appears calm and comfortable, elderly female, on nasal cannula, communicating well, sitting comfortably on the bed Respiratory system: Some crackles on the basis.  No use of accessory muscles.  No wheezing Cardiovascular system: S1 & S2 heard, RRR. No JVD, murmurs, rubs, gallops or clicks. No pedal edema. Gastrointestinal system: Abdomen is nondistended, soft and nontender. No organomegaly or masses felt. Normal bowel sounds heard. Central nervous system: Alert and oriented. No focal neurological deficits. Extremities: Symmetric 5 x 5 power. Skin: No rashes, lesions or ulcers Psychiatry: Judgement and insight appear normal. Mood & affect appropriate.    Data Reviewed: I have personally reviewed following labs and imaging studies  CBC: Recent Labs  Lab 02/18/21 1446 02/19/21 0649 02/20/21 1014 02/21/21 0358  WBC 14.8* 11.9* 16.6* 9.7  NEUTROABS 13.0*  --   --   --   HGB 10.7* 10.5* 11.1* 10.9*  HCT 33.0* 32.4* 33.7* 33.5*  MCV 100.0 99.1 97.4 98.0  PLT 197 162 188 678    Basic Metabolic Panel: Recent Labs  Lab 02/18/21 1446 02/19/21 0649 02/20/21 1014 02/21/21 0358  NA 133* 135 141 140  K 5.3* 3.9 3.2* 3.7  CL 103 101 101 97*  CO2 20* 21* 29 33*  GLUCOSE 160* 183* 134* 118*  BUN 25* 29* 28* 23  CREATININE 1.78* 1.52* 1.21* 1.06*  CALCIUM 8.7* 8.7* 9.2 8.9  MG  --   --  1.8  --     GFR: Estimated Creatinine Clearance: 29.5 mL/min (A) (by C-G formula based on SCr of 1.06 mg/dL (H)). Liver Function Tests: Recent Labs  Lab 02/18/21 1446  AST 42*  ALT 25  ALKPHOS 52  BILITOT 0.3  PROT 6.8  ALBUMIN 4.2    No results for input(s): LIPASE, AMYLASE in the last 168 hours. No results for input(s): AMMONIA in the last 168 hours. Coagulation Profile: No results for input(s): INR, PROTIME in the last 168 hours. Cardiac Enzymes: No results for input(s): CKTOTAL, CKMB, CKMBINDEX, TROPONINI in the last 168  hours. BNP (last 3 results) No results for input(s): PROBNP in the last 8760 hours. HbA1C: No results for input(s): HGBA1C in the last 72 hours. CBG: No results for input(s): GLUCAP in the last 168 hours. Lipid Profile: No results for input(s): CHOL, HDL, LDLCALC, TRIG, CHOLHDL, LDLDIRECT in the last 72 hours. Thyroid Function Tests: No results for input(s): TSH, T4TOTAL, FREET4, T3FREE, THYROIDAB in the last 72 hours. Anemia Panel: No results for input(s): VITAMINB12, FOLATE, FERRITIN, TIBC, IRON, RETICCTPCT in the last 72 hours. Sepsis Labs: Recent Labs  Lab 02/18/21 1446 02/19/21 0336 02/20/21 0408  PROCALCITON <0.10 0.10 <0.10     Recent Results (from the past 240 hour(s))  Resp Panel by RT-PCR (Flu A&B, Covid) Nasopharyngeal Swab     Status: None   Collection Time: 02/18/21  6:29 PM   Specimen: Nasopharyngeal Swab; Nasopharyngeal(NP) swabs in vial transport medium  Result Value Ref Range Status   SARS Coronavirus 2 by RT PCR NEGATIVE NEGATIVE Final    Comment: (NOTE) SARS-CoV-2 target nucleic acids are NOT DETECTED.  The SARS-CoV-2 RNA is generally detectable in upper respiratory specimens during the acute phase of infection. The lowest concentration of  SARS-CoV-2 viral copies this assay can detect is 138 copies/mL. A negative result does not preclude SARS-Cov-2 infection and should not be used as the sole basis for treatment or other patient management decisions. A negative result may occur with  improper specimen collection/handling, submission of specimen other than nasopharyngeal swab, presence of viral mutation(s) within the areas targeted by this assay, and inadequate number of viral copies(<138 copies/mL). A negative result must be combined with clinical observations, patient history, and epidemiological information. The expected result is Negative.  Fact Sheet for Patients:  EntrepreneurPulse.com.au  Fact Sheet for Healthcare Providers:   IncredibleEmployment.be  This test is no t yet approved or cleared by the [place] FDA and  has been authorized for detection and/or diagnosis of SARS-CoV-2 by FDA under an Emergency Use Authorization (EUA). This EUA will remain  in effect (meaning this test can be used) for the duration of the COVID-19 declaration under Section 564(b)(1) of the Act, 21 U.S.C.section 360bbb-3(b)(1), unless the authorization is terminated  or revoked sooner.       Influenza A by PCR NEGATIVE NEGATIVE Final   Influenza B by PCR NEGATIVE NEGATIVE Final    Comment: (NOTE) The Xpert Xpress SARS-CoV-2/FLU/RSV plus assay is intended as an aid in the diagnosis of influenza from Nasopharyngeal swab specimens and should not be used as a sole basis for treatment. Nasal washings and aspirates are unacceptable for Xpert Xpress SARS-CoV-2/FLU/RSV testing.  Fact Sheet for Patients: EntrepreneurPulse.com.au  Fact Sheet for Healthcare Providers: IncredibleEmployment.be  This test is not yet approved or cleared by the [place] FDA and has been authorized for detection and/or diagnosis of SARS-CoV-2 by FDA under an Emergency Use Authorization (EUA). This EUA will remain in effect (meaning this test can be used) for the duration of the COVID-19 declaration under Section 564(b)(1) of the Act, 21 U.S.C. section 360bbb-3(b)(1), unless the authorization is terminated or revoked.  Performed at [facility], [place] [address], [place], [place] [address]   Respiratory (~20 pathogens) panel by PCR     Status: None   Collection Time: 02/18/21  6:29 PM   Specimen: Nasopharyngeal Swab; Respiratory  Result Value Ref Range Status   Adenovirus NOT DETECTED NOT DETECTED Final   Coronavirus 229E NOT DETECTED NOT DETECTED Final    Comment: (NOTE) The Coronavirus on the Respiratory Panel, DOES NOT test for the novel  Coronavirus (2019 nCoV)     Coronavirus HKU1 NOT DETECTED NOT DETECTED Final   Coronavirus NL63 NOT DETECTED NOT DETECTED Final   Coronavirus OC43 NOT DETECTED NOT DETECTED Final   Metapneumovirus NOT DETECTED NOT DETECTED Final   Rhinovirus / Enterovirus NOT DETECTED NOT DETECTED Final   Influenza A NOT DETECTED NOT DETECTED Final   Influenza B NOT DETECTED NOT DETECTED Final   Parainfluenza Virus 1 NOT DETECTED NOT DETECTED Final   Parainfluenza Virus 2 NOT DETECTED NOT DETECTED Final   Parainfluenza Virus 3 NOT DETECTED NOT DETECTED Final   Parainfluenza Virus 4 NOT DETECTED NOT DETECTED Final   Respiratory Syncytial Virus NOT DETECTED NOT DETECTED Final   Bordetella pertussis NOT DETECTED NOT DETECTED Final   Bordetella Parapertussis NOT DETECTED NOT DETECTED Final   Chlamydophila pneumoniae NOT DETECTED NOT DETECTED Final   Mycoplasma pneumoniae NOT DETECTED NOT DETECTED Final    Comment: Performed at [facility], [place]. [address] [place], [place] [address]       Radiology Studies: ECHOCARDIOGRAM COMPLETE  Result Date: 02/20/2021    ECHOCARDIOGRAM REPORT  Patient Name:   NANCEE BROWNRIGG Date of Exam: 02/19/2021 Medical Rec #:  [id number]      Height:       61.0 in Accession #:    [id number]     Weight:       103.8 lb Date of Birth:  [birth year]      BSA:          1.429 m Patient Age:    35 years       BP:           152/62 mmHg Patient Gender: F              HR:           98 bpm. Exam Location:  Inpatient Procedure: 2D Echo, Cardiac Doppler and Color Doppler Indications:     CHF-Acute Diastolic P80.99  History:         Patient has prior history of Echocardiogram examinations, most                  recent 06/18/2020. CHF, COPD; Risk Factors:Hypertension.  Sonographer:     Bernadene Person RDCS Referring Phys:  [id number] Jonnie Finner Diagnosing Phys: Vernell Leep MD IMPRESSIONS  1. Left ventricular ejection fraction, by estimation, is 65 to 70%. The left ventricle has hyperdynamic function. The left ventricle has no  regional wall motion abnormalities. There is moderate left ventricular hypertrophy. Left ventricular diastolic parameters are indeterminate. Increased LVOT velocity (Vmax 11 mmHg) due to mild LVOT obstruction secondary to hyperdynamic LV. 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  2. Right ventricular systolic function is normal. The right ventricular size is normal. There is moderately elevated pulmonary artery systolic pressure. Estimated PASP 47 mmHg.  3. Left atrial size was severely dilated.  4. The mitral valve is normal in structure. Moderate mitral valve regurgitation. No evidence of mitral stenosis.  5. The aortic valve is normal in structure. Aortic valve regurgitation is not visualized. No aortic stenosis is present.  6. The inferior vena cava is normal in size with <50% respiratory variability, suggesting right atrial pressure of 8 mmHg.  7. Compared to previous outpatient study on 06/17/2020, tricuspid regurgitation and pulmonary hypertension are new findings. LVH is No other significant change noted. FINDINGS  Left Ventricle: Increased LVOT velocity (Vmax 11 mmHg) due to mild LVOT obstruction secondary to hyperdynamic LV. 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 Left ventricular ejection fraction, by estimation, is 65 to 70%. The left  ventricle has hyperdynamic function. The left ventricle has no regional wall motion abnormalities. The left ventricular internal cavity size was normal in size. There is moderate left ventricular hypertrophy. Left ventricular diastolic parameters are indeterminate. Right Ventricle: The right ventricular size is normal. No increase in right ventricular wall thickness. Right ventricular systolic function is normal. There is moderately elevated pulmonary artery systolic pressure. The tricuspid regurgitant velocity is 3.13 m/s, and with an assumed right atrial pressure of 8 mmHg, the estimated right ventricular systolic pressure  is 53.9 mmHg. Left Atrium: Left atrial size was severely dilated. Right Atrium: Right atrial size was normal in size. Pericardium: There is no evidence of pericardial effusion. Mitral Valve: The mitral valve is normal in structure. Moderate mitral valve regurgitation. No evidence of mitral valve stenosis. Tricuspid Valve: The tricuspid valve is normal in structure. Tricuspid valve regurgitation is mild . No evidence of tricuspid stenosis. Aortic Valve: The aortic valve is normal in structure. Aortic valve regurgitation is not visualized. No aortic stenosis is present. Pulmonic  Valve: The pulmonic valve was normal in structure. Pulmonic valve regurgitation is not visualized. No evidence of pulmonic stenosis. Aorta: The aortic root is normal in size and structure. Venous: The inferior vena cava is normal in size with less than 50% respiratory variability, suggesting right atrial pressure of 8 mmHg. IAS/Shunts: The interatrial septum was not assessed.  LEFT VENTRICLE PLAX 2D LVIDd:         3.60 cm LVIDs:         2.20 cm LV PW:         1.20 cm LV IVS:        1.20 cm LVOT diam:     1.90 cm LV SV:         108 LV SV Index:   75 LVOT Area:     2.84 cm  LV Volumes (MOD) LV vol d, MOD A2C: 70.6 ml LV vol d, MOD A4C: 57.3 ml LV vol s, MOD A2C: 24.3 ml LV vol s, MOD A4C: 18.1 ml LV SV MOD A2C:     46.3 ml LV SV MOD A4C:     57.3 ml LV SV MOD BP:      44.6 ml RIGHT VENTRICLE RV S prime:     12.40 cm/s TAPSE (M-mode): 3.2 cm LEFT ATRIUM             Index        RIGHT ATRIUM           Index LA diam:        4.30 cm 3.01 cm/m   RA Area:     14.40 cm LA Vol (A2C):   81.9 ml 57.30 ml/m  RA Volume:   29.80 ml  20.85 ml/m LA Vol (A4C):   70.3 ml 49.19 ml/m LA Biplane Vol: 82.3 ml 57.58 ml/m  AORTIC VALVE LVOT Vmax:   164.00 cm/s LVOT Vmean:  124.000 cm/s LVOT VTI:    0.380 m  AORTA Ao Root diam: 3.40 cm Ao Asc diam:  3.60 cm MR Peak grad: 206.5 mmHg  TRICUSPID VALVE MR Mean grad: 125.0 mmHg  TR Peak grad:   39.2 mmHg MR Vmax:       718.50 cm/s TR Vmax:        313.00 cm/s MR Vmean:     531.0 cm/s                           SHUNTS                           Systemic VTI:  0.38 m                           Systemic Diam: 1.90 cm Manish Patwardhan MD Electronically signed by Vernell Leep MD Signature Date/Time: 02/20/2021/9:00:23 AM    Final     Scheduled Meds:  atorvastatin  40 mg Oral QHS   benzonatate  100 mg Oral TID   digoxin  0.125 mg Oral Daily   diltiazem  180 mg Oral Daily   DULoxetine  60 mg Oral Daily   furosemide  40 mg Oral Daily   ipratropium-albuterol  3 mL Nebulization BID   metoprolol succinate  25 mg Oral Daily   mometasone-formoterol  2 puff Inhalation BID   nicotine  21 mg Transdermal Daily   pantoprazole  40 mg Oral Daily   pregabalin  50  mg Oral BID   senna-docusate  1 tablet Oral BID   umeclidinium bromide  1 puff Inhalation Daily   Continuous Infusions:   LOS: 2 days   Time spent: 35 minutes   Henretter Piekarski Loann Quill, MD [facility]  If 7PM-7AM, please contact night-coverage [email or web address] 02/21/2021, 11:23 AM

## 2021-02-21 NOTE — Progress Notes (Signed)
SATURATION QUALIFICATIONS: (This note is used to comply with regulatory documentation for home oxygen)  Patient Saturations on Room Air at Rest = 86%  Patient Saturations on Room Air while Ambulating = 86%  Patient Saturations on 3 Liters of oxygen while Ambulating = 93%  Please briefly explain why patient needs home oxygen: patient requiring O2 at rest

## 2021-02-21 NOTE — Evaluation (Signed)
Physical Therapy Evaluation Patient Details Name: Kelly [name] MRN: [id number] DOB: 23-Jan-1940 Today's Date: 02/21/2021  History of Present Illness  81 y.o. female  Presenting with a left ureteral mass s/p left ureteroscopy 2/1,had some orthostatic hypotension post procedure. WUJ:WJXB, dHFpEF, GAD, PAF, HTN, tobacco use  Clinical Impression  Pt admitted with above diagnosis.  Pt weaker than her baseline, has not walked in 3-4 days. She was able to amb 77' x2 with min assist for balance throughout and on 3L O2, SpO2=95-97% on 3L. Pt is pleasant and motivated to work with PT and asking when she can go home. Will benefit from HHPT at d/c. Pt reports she has family assist 24/7 if needed   Pt currently with functional limitations due to the deficits listed below (see PT Problem List). Pt will benefit from skilled PT to increase their independence and safety with mobility to allow discharge to the venue listed below.          Recommendations for follow up therapy are one component of a multi-disciplinary discharge planning process, led by the attending physician.  Recommendations may be updated based on patient status, additional functional criteria and insurance authorization.  Follow Up Recommendations Home health PT    Assistance Recommended at Discharge Intermittent Supervision/Assistance  Patient can return home with the following  A little help with walking and/or transfers;Help with stairs or ramp for entrance;Assistance with cooking/housework    Equipment Recommendations  -may want to get a cane or discussed waiting until she works with HHPT  Recommendations for Other Services       Functional Status Assessment Patient has had a recent decline in their functional status and demonstrates the ability to make significant improvements in function in a reasonable and predictable amount of time.     Precautions / Restrictions Precautions Precautions: Fall Restrictions Weight Bearing  Restrictions: No      Mobility  Bed Mobility Overal bed mobility: Needs Assistance Bed Mobility: Supine to Sit     Supine to sit: Supervision     General bed mobility comments: for safety,incr time, no physical assist    Transfers Overall transfer level: Needs assistance Equipment used: None Transfers: Sit to/from Stand Sit to Stand: Min guard           General transfer comment: for safety and steadying, unsteady on initial standing    Ambulation/Gait Ambulation/Gait assistance: Min assist Gait Distance (Feet): 60 Feet (x2) Assistive device: None, 1 person hand held assist Gait Pattern/deviations: Step-through pattern, Decreased stride length, Drifts right/left Gait velocity: decr     General Gait Details: pt requires steadying assist throughout distance, desires to amb without device, fatigues quickly (however has not walked since she has been here). sitting rest break needed after 60'  Stairs            Wheelchair Mobility    Modified Rankin (Stroke Patients Only)       Balance Overall balance assessment: Needs assistance Sitting-balance support: Feet supported, No upper extremity supported Sitting balance-Leahy Scale: Good       Standing balance-Leahy Scale: Fair Standing balance comment: reliant on external assist for dynamic tasks                             Pertinent Vitals/Pain Pain Assessment Pain Assessment: No/denies pain    Home Living Family/patient expects to be discharged to:: Private residence Living Arrangements: Children Available Help at Discharge: Family;Available 24 hours/day Type of Home:  House Home Access: Stairs to enter Entrance Stairs-Rails: None Entrance Stairs-Number of Steps: 5 Alternate Level Stairs-Number of Steps: 13 Home Layout: Multi-level Home Equipment: None      Prior Function Prior Level of Function : Independent/Modified Independent             Mobility Comments: pt reports  independence with mobility adn ADLS       Hand Dominance        Extremity/Trunk Assessment   Upper Extremity Assessment Upper Extremity Assessment: Generalized weakness    Lower Extremity Assessment Lower Extremity Assessment: Generalized weakness       Communication   Communication: No difficulties  Cognition Arousal/Alertness: Awake/alert Behavior During Therapy: WFL for tasks assessed/performed Overall Cognitive Status: Within Functional Limits for tasks assessed                                          General Comments      Exercises     Assessment/Plan    PT Assessment Patient needs continued PT services  PT Problem List Decreased strength;Decreased mobility;Decreased activity tolerance;Decreased balance;Decreased knowledge of use of DME       PT Treatment Interventions DME instruction;Therapeutic exercise;Gait training;Functional mobility training;Therapeutic activities;Patient/family education;Balance training    PT Goals (Current goals can be found in the Care Plan section)  Acute Rehab PT Goals Patient Stated Goal: to go home soon PT Goal Formulation: With patient Time For Goal Achievement: 03/07/21 Potential to Achieve Goals: Good    Frequency Min 3X/week     Co-evaluation               AM-PAC PT "6 Clicks" Mobility  Outcome Measure Help needed turning from your back to your side while in a flat bed without using bedrails?: None Help needed moving from lying on your back to sitting on the side of a flat bed without using bedrails?: None Help needed moving to and from a bed to a chair (including a wheelchair)?: A Little Help needed standing up from a chair using your arms (e.g., wheelchair or bedside chair)?: A Little Help needed to walk in hospital room?: A Little Help needed climbing 3-5 steps with a railing? : A Lot 6 Click Score: 19    End of Session Equipment Utilized During Treatment: Gait belt Activity  Tolerance: Patient limited by fatigue;Patient tolerated treatment well Patient left: in chair;with call bell/phone within reach;with chair alarm set Nurse Communication: Mobility status PT Visit Diagnosis: Other abnormalities of gait and mobility (R26.89);Muscle weakness (generalized) (M62.81)    Time: 5625-6389 PT Time Calculation (min) (ACUTE ONLY): 14 min   Charges:   PT Evaluation $PT Eval Low Complexity: [place], PT  Acute Rehab Dept ([facility]) [phone] Pager [phone]  02/21/2021   [facility] 02/21/2021, 11:19 AM

## 2021-02-21 NOTE — TOC Transition Note (Addendum)
Transition of Care [facility]) - CM/SW Discharge Note   Patient Details  Name: Kelly [name] MRN: [id number] Date of Birth: 01/24/1940  Transition of Care [facility]) CM/SW Contact:  Sanye Ledesma, Marta Lamas, LCSW Phone Number: 02/21/2021, 1:51 PM   Clinical Narrative:     Verbal consent obtained from patient to order home oxygen.  Home oxygen arranged through [place] with [employer].  Portable tank will be delivered to patient's hospital room, prior to discharge.  Patient also agreeable to referral for HHPT/HHRN through [facility]  Referral for HHPT/HHRN arranged through [place] through [facility].  Tentative discharge scheduled for today (02/21/2021).  Patient will discharge home with daughter.    Final next level of care: [place] Barriers to Discharge: No Barriers Identified   Patient Goals and CMS Choice Patient states their goals for this hospitalization and ongoing recovery are:: "To go home". CMS Medicare.gov Compare Post Acute Care list provided to:: Patient Choice offered to / list presented to : Patient  Discharge Placement                N/A       Discharge Plan and Services   Discharge Planning Services: CM Consult            DME Arranged: Oxygen DME Agency: [employer] Date DME Agency Contacted: 02/21/21 Time DME Agency Contacted: [id number] Representative spoke with at DME Agency: [place]: RN, PT [facility] Agency: [facility]) Date [place]: 02/21/21 Time [place]: [id number] Representative spoke with at [place]: [place] [facility])  Social Determinants of Health (SDOH) Interventions     Readmission Risk Interventions No flowsheet data found.

## 2021-02-26 NOTE — Op Note (Signed)
PREOPERATIVE DIAGNOSES:  Left hydronephrosis, suspected ureteral mass.   POSTOPERATIVE DIAGNOSIS:  Left ureteral mass.   PROCEDURE PERFORMED:   1.  Cystoscopy, bilateral retrograde pyelograms and interpretation. 2.  Left ureteroscopy with biopsy and tumor ablation of left ureteral tumor. 3.  Successful left ureteral stent placement.   ESTIMATED BLOOD LOSS:  Nil.   COMPLICATIONS:  None.   SPECIMEN:  Left ureteral mass for permanent pathology   FINDINGS:   1.  Unremarkable right retrograde pyelogram. 2.  Unremarkable bladder. 3.  Significant hydronephrosis and tortuosity of the left. 4.  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 5.  Complete resolution of all intraluminal tumor following ureteroscopic ablation. 6.  Successful placement of left ureteral stent, proximal end in the renal pelvis, distal end in urinary bladder.  No tether.    INDICATIONS:  The patient is a comorbid 81 year old lady with history of COPD.  She continues to smoke.  She was found on workup of hematuria to have a left distal ureteral mass with enhancement and what appeared to be chronic hydronephrosis.  No obvious  adenopathy.  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  Options were discussed for management including recommended path of initial endoscopic procedure with the goal of tissue biopsy, possible resection and stenting.  She  wished to proceed.  Informed consent was obtained and placed in medical record.   PROCEDURE IN DETAIL:  The patient is being identified and procedure being cysto, bilateral retrogrades, left ureteroscopy with possible laser ablation and biopsy was confirmed.  Procedure time-out was performed, intravenous antibiotics administered.   General LMA anesthesia induced.  The patient was placed into a low lithotomy position.  Sterile field was created.  Prepped and draped the patient's vagina, introitus and proximal thighs using iodine.   Cystourethroscopy was performed using a 21-French  rigid cystoscope with the offset lens.  Inspection of the bladder was unremarkable.  Ureteral orifices were singleton.  The right ureteral orifice was cannulated with a 6-French renal catheter and a right retrograde pyelogram was obtained.   Right retrograde pyelogram demonstrated a single right ureter, single system right kidney.  No filling defects or narrowing noted.  Similarly, a left retrograde pyelogram was obtained.   Left retrograde pyelogram demonstrated a single left ureter with significant hydronephrosis and filling defect in the distal ureter consistent with known mass.  There was contrast filling above this with significant tortuosity and hydronephrosis of the  ureter that was severe.  A 0.038 ZIPwire was advanced to the level of the kidney as a safety wire.  An 8-French feeding tube placed in the urinary bladder for pressure release.  Next, a semirigid ureteroscopy was performed distal four-fifths of left  ureter alongside a separate sensor working wire and in the distal ureter, there was a very smooth appearing mass that was quite obstructive.  This was relatively nonpapillary and somewhat unusual, did not have the typical appearance of high-grade  urothelial neoplasm. As we had sufficient wire access above this it was felt that biopsy was warranted and given the tumor orientation, it was felt that a basketing snaring technique would be most efficient as such an Escape basket was used to snare the  tumor.  This was performed times several and was highly successful.  Very large tumor fragments were resected using this technique.  This made approximately 2 cm3 total tumor volume removed set aside for pathology.  I was frankly amazed at the success of  the  resection using the steering technique, the tumor with further resection was clearly seen that it was connected to the ureter by relatively small and relatively avascular stalk.  This was  ablated using holmium laser energy using settings of 1 joule  and 5 Hz down flush to the superficial fibromuscular stroma of the ureter.  No perforation occurred.  I was quite happy with the ablation of this lesion.  Additional ureteroscopy was performed using semi-rigid technique for distal two-thirds left ureter.   No mucositis were found.  The semirigid scope was then exchanged over the sensor working wire to the level of the kidney for a single channel digital ureteroscope and flexible digital ureteroscopy was performed of the left kidney calyces x3 and the  entire length left ureter.  There were no additional lesions noted whatsoever.  This was quite favorable. Given the prolonged hydronephrosis and unclear etiology of her mass, it was felt that interval stenting with a non-tethered stent was prudent such a  new 5 x 26 Polaris type stent was placed using fluoroscopic guidance, good proximal and distal plane were noted.  Procedure was terminated.  The patient tolerated procedure well.  No immediate complications.  The patient was taken to postanesthesia care  in stable condition.  Plan for discharge home.  Further treatment pending histology of the lesion.

## 2021-02-27 ENCOUNTER — Other Ambulatory Visit: Payer: Self-pay

## 2021-02-27 ENCOUNTER — Encounter (HOSPITAL_COMMUNITY): Payer: Self-pay

## 2021-02-27 ENCOUNTER — Observation Stay (HOSPITAL_COMMUNITY)
Admission: EM | Admit: 2021-02-27 | Discharge: 2021-02-28 | Disposition: A | Discharge status: Home / Self Care | Payer: Medicare Other | Attending: Internal Medicine | Admitting: Internal Medicine

## 2021-02-27 DIAGNOSIS — Z7901 Long term (current) use of anticoagulants: Secondary | ICD-10-CM | POA: Diagnosis not present

## 2021-02-27 DIAGNOSIS — N179 Acute kidney failure, unspecified: Secondary | ICD-10-CM | POA: Diagnosis not present

## 2021-02-27 DIAGNOSIS — Z20822 Contact with and (suspected) exposure to covid-19: Secondary | ICD-10-CM | POA: Insufficient documentation

## 2021-02-27 DIAGNOSIS — Z79899 Other long term (current) drug therapy: Secondary | ICD-10-CM | POA: Insufficient documentation

## 2021-02-27 DIAGNOSIS — Z859 Personal history of malignant neoplasm, unspecified: Secondary | ICD-10-CM | POA: Insufficient documentation

## 2021-02-27 DIAGNOSIS — I509 Heart failure, unspecified: Secondary | ICD-10-CM | POA: Diagnosis not present

## 2021-02-27 DIAGNOSIS — R110 Nausea: Secondary | ICD-10-CM

## 2021-02-27 DIAGNOSIS — R778 Other specified abnormalities of plasma proteins: Secondary | ICD-10-CM | POA: Diagnosis present

## 2021-02-27 DIAGNOSIS — E039 Hypothyroidism, unspecified: Secondary | ICD-10-CM | POA: Diagnosis not present

## 2021-02-27 DIAGNOSIS — F17210 Nicotine dependence, cigarettes, uncomplicated: Secondary | ICD-10-CM | POA: Insufficient documentation

## 2021-02-27 DIAGNOSIS — I480 Paroxysmal atrial fibrillation: Secondary | ICD-10-CM | POA: Diagnosis present

## 2021-02-27 DIAGNOSIS — J449 Chronic obstructive pulmonary disease, unspecified: Secondary | ICD-10-CM | POA: Insufficient documentation

## 2021-02-27 DIAGNOSIS — R112 Nausea with vomiting, unspecified: Secondary | ICD-10-CM | POA: Diagnosis present

## 2021-02-27 DIAGNOSIS — K219 Gastro-esophageal reflux disease without esophagitis: Secondary | ICD-10-CM | POA: Diagnosis not present

## 2021-02-27 DIAGNOSIS — I110 Hypertensive heart disease with heart failure: Secondary | ICD-10-CM | POA: Diagnosis not present

## 2021-02-27 DIAGNOSIS — R319 Hematuria, unspecified: Secondary | ICD-10-CM | POA: Insufficient documentation

## 2021-02-27 DIAGNOSIS — R7989 Other specified abnormal findings of blood chemistry: Secondary | ICD-10-CM | POA: Diagnosis present

## 2021-02-27 DIAGNOSIS — E860 Dehydration: Secondary | ICD-10-CM | POA: Diagnosis not present

## 2021-02-27 LAB — CBC WITH DIFFERENTIAL/PLATELET
Abs Immature Granulocytes: 0.06 K/uL (ref 0.00–0.07)
Basophils Absolute: 0.1 K/uL (ref 0.0–0.1)
Basophils Relative: 1 %
Eosinophils Absolute: 0.2 K/uL (ref 0.0–0.5)
Eosinophils Relative: 2 %
HCT: 34.3 % — ABNORMAL LOW (ref 36.0–46.0)
Hemoglobin: 11.6 g/dL — ABNORMAL LOW (ref 12.0–15.0)
Immature Granulocytes: 1 %
Lymphocytes Relative: 22 %
Lymphs Abs: 1.8 K/uL (ref 0.7–4.0)
MCH: 31.8 pg (ref 26.0–34.0)
MCHC: 33.8 g/dL (ref 30.0–36.0)
MCV: 94 fL (ref 80.0–100.0)
Monocytes Absolute: 1 K/uL (ref 0.1–1.0)
Monocytes Relative: 12 %
Neutro Abs: 5.1 K/uL (ref 1.7–7.7)
Neutrophils Relative %: 62 %
Platelets: 208 K/uL (ref 150–400)
RBC: 3.65 MIL/uL — ABNORMAL LOW (ref 3.87–5.11)
RDW: 13.9 % (ref 11.5–15.5)
WBC: 8.1 K/uL (ref 4.0–10.5)
nRBC: 0 % (ref 0.0–0.2)

## 2021-02-27 LAB — TROPONIN I (HIGH SENSITIVITY): Troponin I (High Sensitivity): 82 ng/L — ABNORMAL HIGH (ref <18)

## 2021-02-27 LAB — BASIC METABOLIC PANEL WITH GFR
Anion gap: 11 (ref 5–15)
BUN: 30 mg/dL — ABNORMAL HIGH (ref 8–23)
CO2: 27 mmol/L (ref 22–32)
Calcium: 9.1 mg/dL (ref 8.9–10.3)
Chloride: 98 mmol/L (ref 98–111)
Creatinine, Ser: 1.32 mg/dL — ABNORMAL HIGH (ref 0.44–1.00)
GFR, Estimated: 41 mL/min — ABNORMAL LOW (ref >60)
Glucose, Bld: 109 mg/dL — ABNORMAL HIGH (ref 70–99)
Potassium: 3.7 mmol/L (ref 3.5–5.1)
Sodium: 136 mmol/L (ref 135–145)

## 2021-02-27 LAB — MAGNESIUM: Magnesium: 2 mg/dL (ref 1.7–2.4)

## 2021-02-27 MED ORDER — SODIUM CHLORIDE 0.9 % IV BOLUS
500.0000 mL | Freq: Once | INTRAVENOUS | Status: AC
  Administered 2021-02-27: 500 mL via INTRAVENOUS

## 2021-02-27 MED ORDER — POTASSIUM CHLORIDE CRYS ER 20 MEQ PO TBCR
40.0000 meq | EXTENDED_RELEASE_TABLET | Freq: Once | ORAL | Status: AC
  Administered 2021-02-27: 40 meq via ORAL
  Filled 2021-02-27: qty 2

## 2021-02-27 MED ORDER — ONDANSETRON HCL 4 MG/2ML IJ SOLN
4.0000 mg | Freq: Once | INTRAMUSCULAR | Status: AC
Start: 2021-02-27 — End: 2021-02-27
  Administered 2021-02-27: 4 mg via INTRAVENOUS
  Filled 2021-02-27: qty 2

## 2021-02-27 NOTE — ED Provider Notes (Signed)
[place] DEPT Provider Note   CSN: [id number] Arrival date & time: 02/27/21  1951     History  Chief Complaint  Patient presents with   Nausea   Dizziness    Kelly [name] is a 81 y.o. female.  Patient presents with chief complaint of nausea vomiting lightheadedness.  She states symptoms have been going on for about 10 days.  This started after she had a polyp removed from her urinary tract about 10 days ago.  She otherwise denies headache or chest pain denies any abdominal pain.  No fevers reported.  Patient states that she did notice intermittent hematuria since her procedure as well.  Denies flank pain or back pain otherwise.      Home Medications Prior to Admission medications   Medication Sig Start Date End Date Taking? Authorizing Provider  albuterol (VENTOLIN HFA) 108 (90 Base) MCG/ACT inhaler Inhale 1 puff into the lungs every 6 (six) hours as needed for wheezing or shortness of breath.    [provider]  apixaban (ELIQUIS) 2.5 MG TABS tablet Take 1 tablet (2.5 mg total) by mouth 2 (two) times daily. 02/09/21   Cantwell, Celeste C, PA-C  atorvastatin (LIPITOR) 40 MG tablet Take 1 tablet (40 mg total) by mouth at bedtime. 09/30/20   Cantwell, Celeste C, PA-C  b complex vitamins tablet Take 1 tablet by mouth daily.    [provider]  Bempedoic Acid-Ezetimibe (NEXLIZET) 180-10 MG TABS Take 1 tablet by mouth daily. Patient not taking: Reported on 02/10/2021 09/30/20   Cantwell, Anderson Malta C, PA-C  budesonide-formoterol (SYMBICORT) 160-4.5 MCG/ACT inhaler Inhale 2 puffs into the lungs 2 (two) times daily as needed (shortness of breath or wheezing).    [provider]  digoxin (LANOXIN) 0.125 MG tablet TAKE 1 TABLET(0.125 MG) BY MOUTH DAILY 12/31/20   Cantwell, Celeste C, PA-C  diltiazem (CARDIZEM CD) 180 MG 24 hr capsule Take 180 mg by mouth daily.    [provider]  DULoxetine (CYMBALTA) 60 MG capsule Take 60 mg  by mouth daily. 08/19/20   [provider]  EPINEPHrine 0.3 mg/0.3 mL IJ SOAJ injection Inject 0.3 mg into the muscle as needed for anaphylaxis.    [provider]  ferrous sulfate 325 (65 FE) MG EC tablet Take 1 tablet (325 mg total) by mouth daily with breakfast. 08/02/16   [place], Betty G, MD  folic acid (FOLVITE) 1 MG tablet Take 1 mg by mouth daily.    [provider]  furosemide (LASIX) 20 MG tablet Take 20 mg by mouth daily. 09/02/20   [provider]  metoprolol succinate (TOPROL-XL) 25 MG 24 hr tablet Take 25 mg by mouth daily.    [provider]  nicotine (NICODERM CQ - DOSED IN MG/24 HOURS) 21 mg/24hr patch Place 1 patch (21 mg total) onto the skin daily. 06/04/20   Adrian Prows, MD  pantoprazole (PROTONIX) 40 MG tablet Take 40 mg by mouth daily.    [provider]  pregabalin (LYRICA) 50 MG capsule Take 50 mg by mouth 2 (two) times daily.    [provider]  REPATHA 140 MG/ML SOSY INJECT 1ML UNDER THE SKIN EVERY 14 DAYS AS DIRECTED 02/02/21   Cantwell, Gerline Legacy, PA-C  Spacer/Aero-Holding Chambers (VORTEX VALVED HOLDING CHAMBER) DEVI by Does not apply route.    [provider]  tiotropium (SPIRIVA) 18 MCG inhalation capsule Place 18 mcg into inhaler and inhale daily as needed (shortness of breath).    [provider]  traMADol (ULTRAM) 50 MG tablet Take 1 tablet (50 mg total) by mouth every 6 (six) hours as needed for moderate pain or severe pain (post-operatively). 02/17/21 02/17/22  Alexis Frock, MD  TRELEGY ELLIPTA 100-62.5-25 MCG/INH AEPB Take 1 puff by mouth daily as needed (shortness of breath or wheezing). 08/31/20   [provider]      Allergies    Bactrim [sulfamethoxazole-trimethoprim] and Bee venom    Review of Systems   Review of Systems  Constitutional:  Negative for fever.  HENT:  Negative for ear pain.   Eyes:  Negative for pain.  Respiratory:  Negative for cough.   Cardiovascular:   Negative for chest pain.  Gastrointestinal:  Negative for abdominal pain.  Genitourinary:  Negative for flank pain.  Musculoskeletal:  Negative for back pain.  Skin:  Negative for rash.  Neurological:  Negative for headaches.   Physical Exam Updated Vital Signs BP (!) 157/89    Pulse 100    Temp 98.3 F (36.8 C) (Oral)    Resp (!) 23    Ht 5\' 1"  (1.549 m)    Wt 44.8 kg    SpO2 95%    BMI 18.66 kg/m  Physical Exam Constitutional:      General: She is not in acute distress.    Appearance: Normal appearance.  HENT:     Head: Normocephalic.     Nose: Nose normal.  Eyes:     Extraocular Movements: Extraocular movements intact.  Cardiovascular:     Rate and Rhythm: Normal rate.  Pulmonary:     Effort: Pulmonary effort is normal.  Abdominal:     Tenderness: There is no abdominal tenderness. There is no guarding or rebound.  Musculoskeletal:        General: Normal range of motion.     Cervical back: Normal range of motion.  Neurological:     General: No focal deficit present.     Mental Status: She is alert. Mental status is at baseline.    ED Results / Procedures / Treatments   Labs (all labs ordered are listed, but only abnormal results are displayed) Labs Reviewed  CBC WITH DIFFERENTIAL/PLATELET - Abnormal; Notable for the following components:      Result Value   RBC 3.65 (*)    Hemoglobin 11.6 (*)    HCT 34.3 (*)    All other components within normal limits  BASIC METABOLIC PANEL - Abnormal; Notable for the following components:   Glucose, Bld 109 (*)    BUN 30 (*)    Creatinine, Ser 1.32 (*)    GFR, Estimated 41 (*)    All other components within normal limits  TROPONIN I (HIGH SENSITIVITY) - Abnormal; Notable for the following components:   Troponin I (High Sensitivity) 82 (*)    All other components within normal limits  URINE CULTURE  MAGNESIUM  URINALYSIS, ROUTINE W REFLEX MICROSCOPIC    EKG EKG Interpretation  Date/Time:  Saturday February 27 2021  20:10:59 EST Ventricular Rate:  97 PR Interval:  167 QRS Duration: 84 QT Interval:  380 QTC Calculation: 483 R Axis:   65 Text Interpretation: Ectopic atrial tachycardia, unifocal Paired ventricular premature complexes LVH with secondary repolarization abnormality Anterior ST elevation, probably due to LVH Confirmed by Thamas Jaegers ([id number]) on 02/27/2021 9:32:39 PM  Radiology No results found.  Procedures Procedures    Medications Ordered in ED Medications  sodium chloride 0.9 % bolus 500 mL (0 mLs Intravenous Stopped 02/27/21 2118)  ondansetron [facility]) injection 4 mg (4 mg Intravenous Given 02/27/21 2153)  potassium chloride SA (KLOR-CON M) CR tablet 40 mEq (40 mEq Oral Given 02/27/21 2153)    ED Course/ Medical Decision Making/ A&P                           Medical Decision Making Amount and/or Complexity of Data Reviewed Labs: ordered.  Risk Prescription drug management.   EKG shows sinus rhythm.  ST depressions in the lateral leads but appears similar to prior EKGs in the past.  He is otherwise normal rate.  No ST elevations noted.  Labs show white count of 8 hemoglobin 11 chemistry unremarkable.  Troponin elevated 82.  Patient continues to have no chest pain complaints.  Repeat troponin pending.  Will be signed out to oncoming provider.        Final Clinical Impression(s) / ED Diagnoses Final diagnoses:  Nausea  Hematuria, unspecified type    Rx / DC Orders ED Discharge Orders     None         Luna Fuse, MD 02/27/21 2300

## 2021-02-27 NOTE — ED Provider Notes (Signed)
Care assumed from Dr. Almyra Free, patient with hematuria, nausea, vomiting, elevated troponin.  Urinalysis is still pending, delta troponin pending.  Repeat troponin is increasing, will need to be admitted.  She will be given a dose of aspirin.  She continues to complain of nausea but denies any chest pain, heaviness, tightness, pressure.  She denies dyspnea or diaphoresis.  Because of report of significant hematuria, will hold off on heparin at this point.  She has not given a urine sample yet.  Case is discussed with Dr. Velia Meyer of [facility], who agrees to admit the patient.  CRITICAL CARE Performed by: Delora Fuel Total critical care time: 40 minutes Critical care time was exclusive of separately billable procedures and treating other patients. Critical care was necessary to treat or prevent imminent or life-threatening deterioration. Critical care was time spent personally by me on the following activities: development of treatment plan with patient and/or surrogate as well as nursing, discussions with consultants, evaluation of patient's response to treatment, examination of patient, obtaining history from patient or surrogate, ordering and performing treatments and interventions, ordering and review of laboratory studies, ordering and review of radiographic studies, pulse oximetry and re-evaluation of patient's condition.   Results for orders placed or performed during the hospital encounter of 02/27/21  CBC with Differential  Result Value Ref Range   WBC 8.1 4.0 - 10.5 K/uL   RBC 3.65 (L) 3.87 - 5.11 MIL/uL   Hemoglobin 11.6 (L) 12.0 - 15.0 g/dL   HCT 34.3 (L) 36.0 - 46.0 %   MCV 94.0 80.0 - 100.0 fL   MCH 31.8 26.0 - 34.0 pg   MCHC 33.8 30.0 - 36.0 g/dL   RDW 13.9 11.5 - 15.5 %   Platelets 208 150 - 400 K/uL   nRBC 0.0 0.0 - 0.2 %   Neutrophils Relative % 62 %   Neutro Abs 5.1 1.7 - 7.7 K/uL   Lymphocytes Relative 22 %   Lymphs Abs 1.8 0.7 - 4.0 K/uL   Monocytes Relative 12 %    Monocytes Absolute 1.0 0.1 - 1.0 K/uL   Eosinophils Relative 2 %   Eosinophils Absolute 0.2 0.0 - 0.5 K/uL   Basophils Relative 1 %   Basophils Absolute 0.1 0.0 - 0.1 K/uL   Immature Granulocytes 1 %   Abs Immature Granulocytes 0.06 0.00 - 0.07 K/uL  Basic metabolic panel  Result Value Ref Range   Sodium 136 135 - 145 mmol/L   Potassium 3.7 3.5 - 5.1 mmol/L   Chloride 98 98 - 111 mmol/L   CO2 27 22 - 32 mmol/L   Glucose, Bld 109 (H) 70 - 99 mg/dL   BUN 30 (H) 8 - 23 mg/dL   Creatinine, Ser 1.32 (H) 0.44 - 1.00 mg/dL   Calcium 9.1 8.9 - 10.3 mg/dL   GFR, Estimated 41 (L) >60 mL/min   Anion gap 11 5 - 15  Magnesium  Result Value Ref Range   Magnesium 2.0 1.7 - 2.4 mg/dL  Troponin I (High Sensitivity)  Result Value Ref Range   Troponin I (High Sensitivity) 82 (H) <18 ng/L  Troponin I (High Sensitivity)  Result Value Ref Range   Troponin I (High Sensitivity) 94 (H) <67 ng/L      Delora Fuel, MD 20/94/70 [phone]

## 2021-02-27 NOTE — ED Triage Notes (Signed)
Patient BIB [facility] from home. Had a procedure done, a polyp removed from urinary tract. Ever since she came home she has been nauseous, dizzy, decrease in appetite. Procedure was done on 2/1. Patient says she vomits mucus, but having trouble keeping things down.

## 2021-02-28 ENCOUNTER — Observation Stay (HOSPITAL_BASED_OUTPATIENT_CLINIC_OR_DEPARTMENT_OTHER): Payer: Medicare Other

## 2021-02-28 ENCOUNTER — Observation Stay (HOSPITAL_COMMUNITY): Payer: Medicare Other

## 2021-02-28 ENCOUNTER — Encounter (HOSPITAL_COMMUNITY): Payer: Self-pay | Admitting: Internal Medicine

## 2021-02-28 DIAGNOSIS — N179 Acute kidney failure, unspecified: Secondary | ICD-10-CM | POA: Diagnosis present

## 2021-02-28 DIAGNOSIS — R079 Chest pain, unspecified: Secondary | ICD-10-CM

## 2021-02-28 DIAGNOSIS — K219 Gastro-esophageal reflux disease without esophagitis: Secondary | ICD-10-CM

## 2021-02-28 DIAGNOSIS — R112 Nausea with vomiting, unspecified: Secondary | ICD-10-CM

## 2021-02-28 DIAGNOSIS — E860 Dehydration: Secondary | ICD-10-CM | POA: Diagnosis not present

## 2021-02-28 DIAGNOSIS — R778 Other specified abnormalities of plasma proteins: Secondary | ICD-10-CM

## 2021-02-28 DIAGNOSIS — I480 Paroxysmal atrial fibrillation: Secondary | ICD-10-CM

## 2021-02-28 HISTORY — DX: Gastro-esophageal reflux disease without esophagitis: K21.9

## 2021-02-28 LAB — CBC WITH DIFFERENTIAL/PLATELET
Abs Immature Granulocytes: 0.05 10*3/uL (ref 0.00–0.07)
Basophils Absolute: 0.1 10*3/uL (ref 0.0–0.1)
Basophils Relative: 1 %
Eosinophils Absolute: 0.3 10*3/uL (ref 0.0–0.5)
Eosinophils Relative: 4 %
HCT: 29.2 % — ABNORMAL LOW (ref 36.0–46.0)
Hemoglobin: 9.8 g/dL — ABNORMAL LOW (ref 12.0–15.0)
Immature Granulocytes: 1 %
Lymphocytes Relative: 20 %
Lymphs Abs: 1.5 10*3/uL (ref 0.7–4.0)
MCH: 31.9 pg (ref 26.0–34.0)
MCHC: 33.6 g/dL (ref 30.0–36.0)
MCV: 95.1 fL (ref 80.0–100.0)
Monocytes Absolute: 0.9 10*3/uL (ref 0.1–1.0)
Monocytes Relative: 12 %
Neutro Abs: 4.7 10*3/uL (ref 1.7–7.7)
Neutrophils Relative %: 62 %
Platelets: 184 10*3/uL (ref 150–400)
RBC: 3.07 MIL/uL — ABNORMAL LOW (ref 3.87–5.11)
RDW: 14 % (ref 11.5–15.5)
WBC: 7.5 10*3/uL (ref 4.0–10.5)
nRBC: 0 % (ref 0.0–0.2)

## 2021-02-28 LAB — RESP PANEL BY RT-PCR (FLU A&B, COVID) ARPGX2
Influenza A by PCR: NEGATIVE
Influenza B by PCR: NEGATIVE
SARS Coronavirus 2 by RT PCR: NEGATIVE

## 2021-02-28 LAB — COMPREHENSIVE METABOLIC PANEL
ALT: 15 U/L (ref 0–44)
AST: 35 U/L (ref 15–41)
Albumin: 3.3 g/dL — ABNORMAL LOW (ref 3.5–5.0)
Alkaline Phosphatase: 42 U/L (ref 38–126)
Anion gap: 8 (ref 5–15)
BUN: 26 mg/dL — ABNORMAL HIGH (ref 8–23)
CO2: 28 mmol/L (ref 22–32)
Calcium: 8.3 mg/dL — ABNORMAL LOW (ref 8.9–10.3)
Chloride: 99 mmol/L (ref 98–111)
Creatinine, Ser: 1.18 mg/dL — ABNORMAL HIGH (ref 0.44–1.00)
GFR, Estimated: 46 mL/min — ABNORMAL LOW (ref >60)
Glucose, Bld: 96 mg/dL (ref 70–99)
Potassium: 3.6 mmol/L (ref 3.5–5.1)
Sodium: 135 mmol/L (ref 135–145)
Total Bilirubin: 0.3 mg/dL (ref 0.3–1.2)
Total Protein: 5.7 g/dL — ABNORMAL LOW (ref 6.5–8.1)

## 2021-02-28 LAB — URINALYSIS, ROUTINE W REFLEX MICROSCOPIC
Bilirubin Urine: NEGATIVE
Glucose, UA: NEGATIVE mg/dL
Ketones, ur: NEGATIVE mg/dL
Nitrite: NEGATIVE
Protein, ur: 100 mg/dL — AB
RBC / HPF: >50 RBC/hpf — ABNORMAL HIGH (ref 0–5)
Specific Gravity, Urine: 1.015 (ref 1.005–1.030)
WBC, UA: >50 WBC/hpf — ABNORMAL HIGH (ref 0–5)
pH: 5 (ref 5.0–8.0)

## 2021-02-28 LAB — ECHOCARDIOGRAM COMPLETE
Area-P 1/2: 3.31 cm2
Height: 61 in
MV M vel: 7.17 m/s
MV Peak grad: 205.6 mmHg
MV VTI: 1.63 cm2
Radius: 0.5 cm
S' Lateral: 1.8 cm
Weight: 1580.26 oz

## 2021-02-28 LAB — TROPONIN I (HIGH SENSITIVITY)
Troponin I (High Sensitivity): 94 ng/L — ABNORMAL HIGH (ref <18)
Troponin I (High Sensitivity): 81 ng/L — ABNORMAL HIGH (ref <18)

## 2021-02-28 LAB — MAGNESIUM: Magnesium: 1.8 mg/dL (ref 1.7–2.4)

## 2021-02-28 MED ORDER — ONDANSETRON HCL 4 MG PO TABS: 4.0000 mg | ORAL_TABLET | Freq: Three times a day (TID) | ORAL | 1 refills | Status: DC | PRN

## 2021-02-28 MED ORDER — METOPROLOL SUCCINATE ER 50 MG PO TB24
25.0000 mg | ORAL_TABLET | Freq: Every day | ORAL | Status: DC
  Administered 2021-02-28: 25 mg via ORAL
  Filled 2021-02-28: qty 1

## 2021-02-28 MED ORDER — ALBUTEROL SULFATE (2.5 MG/3ML) 0.083% IN NEBU: 2.5000 mg | INHALATION_SOLUTION | Freq: Four times a day (QID) | RESPIRATORY_TRACT | Status: DC | PRN

## 2021-02-28 MED ORDER — APIXABAN 2.5 MG PO TABS
2.5000 mg | ORAL_TABLET | Freq: Two times a day (BID) | ORAL | Status: DC
  Administered 2021-02-28: 2.5 mg via ORAL
  Filled 2021-02-28: qty 1

## 2021-02-28 MED ORDER — ATORVASTATIN CALCIUM 40 MG PO TABS: 40.0000 mg | ORAL_TABLET | Freq: Every day | ORAL | Status: DC

## 2021-02-28 MED ORDER — ACETAMINOPHEN 650 MG RE SUPP: 650.0000 mg | Freq: Four times a day (QID) | RECTAL | Status: DC | PRN

## 2021-02-28 MED ORDER — DILTIAZEM HCL ER COATED BEADS 180 MG PO CP24
180.0000 mg | ORAL_CAPSULE | Freq: Every day | ORAL | Status: DC
  Administered 2021-02-28: 180 mg via ORAL
  Filled 2021-02-28: qty 1

## 2021-02-28 MED ORDER — ASPIRIN 81 MG PO CHEW
324.0000 mg | CHEWABLE_TABLET | Freq: Once | ORAL | Status: AC
  Administered 2021-02-28: 324 mg via ORAL
  Filled 2021-02-28: qty 4

## 2021-02-28 MED ORDER — ACETAMINOPHEN 325 MG PO TABS: 650.0000 mg | ORAL_TABLET | Freq: Four times a day (QID) | ORAL | Status: DC | PRN

## 2021-02-28 MED ORDER — PANTOPRAZOLE SODIUM 40 MG PO TBEC
40.0000 mg | DELAYED_RELEASE_TABLET | Freq: Every day | ORAL | Status: DC
  Administered 2021-02-28: 40 mg via ORAL
  Filled 2021-02-28: qty 1

## 2021-02-28 MED ORDER — LACTATED RINGERS IV SOLN: INTRAVENOUS | Status: DC

## 2021-02-28 MED ORDER — ALBUTEROL SULFATE HFA 108 (90 BASE) MCG/ACT IN AERS: 1.0000 | INHALATION_SPRAY | Freq: Four times a day (QID) | RESPIRATORY_TRACT | Status: DC | PRN

## 2021-02-28 MED ORDER — ONDANSETRON HCL 4 MG/2ML IJ SOLN: 4.0000 mg | Freq: Four times a day (QID) | INTRAMUSCULAR | Status: DC | PRN

## 2021-02-28 NOTE — Assessment & Plan Note (Addendum)
On presentation.  Symptomatic care.  Diet has been advanced at this time.    COVID and influenza was negative.  Improved at this time.  Patient was encouraged oral hydration on discharge.

## 2021-02-28 NOTE — Assessment & Plan Note (Signed)
On Protonix 

## 2021-02-28 NOTE — Assessment & Plan Note (Addendum)
Improved with IV fluids.  Will prescribe Zofran on discharge.  Was able to tolerate solid food.  Ambulated well without any issues

## 2021-02-28 NOTE — Assessment & Plan Note (Addendum)
No chest pain.  No changes in the EKG. recent 2D echocardiogram with preserved LV function.  Acute coronary syndrome has been ruled out.

## 2021-02-28 NOTE — ED Notes (Signed)
Pt ambulated with little assistance to the bathroom with this Probation officer.

## 2021-02-28 NOTE — Progress Notes (Signed)
°  Echocardiogram 2D Echocardiogram has been performed.  Kelly [name] 02/28/2021, 9:02 AM

## 2021-02-28 NOTE — Hospital Course (Signed)
Kelly [name] is a 81 y.o. female with medical history significant for paroxysmal atrial fibrillation chronically anticoagulated on Eliquis, GERD, presented to hospital with intractable nausea and vomiting for 2 days prior to presentation with decreased oral tolerance.  Of note, she underwent cystoscopy with left ureteral stent placement on February 17, 2021 but denied urinary symptoms.  In the ED vitals were stable.  Labs showed a creatinine of 1.3 with BUN of 30 with elevated troponin.  COVID-19 and flu was negative.  EKG  nonspecific ST depression in V4 through V6, which ST depression in V5/V6.  Similar to most recent prior EKG from 09/30/2020, and demonstrates no evidence of ST elevation.  In the ED patient received potassium and Zofran normal saline and was then considered for admission to the hospital for further evaluation and treatment.

## 2021-02-28 NOTE — ED Notes (Signed)
Updated daughter on patient being admitted.

## 2021-02-28 NOTE — Discharge Summary (Signed)
Physician Discharge Summary   Patient: Kelly [name] MRN: [id number] DOB: 1940/03/26  Admit date:     02/27/2021  Discharge date: 02/28/21  Discharge Physician: Corrie Mckusick Advay Volante   PCP: Joya Gaskins, FNP   Recommendations at discharge:   Follow-up with your primary care physician in 1 week.  Discharge Diagnoses: Active Problems:   AKI (acute kidney injury) ([place])   Dehydration   Paroxysmal atrial fibrillation (HCC)   Troponin level elevated   GERD (gastroesophageal reflux disease)  Principal Problem (Resolved):   Intractable nausea and vomiting   Hospital Course:  Kelly [name] is a 81 y.o. female with medical history significant for paroxysmal atrial fibrillation chronically anticoagulated on Eliquis, GERD, presented to hospital with intractable nausea and vomiting for 2 days prior to presentation with decreased oral tolerance.  Of note, she underwent cystoscopy with left ureteral stent placement on February 17, 2021 but denied urinary symptoms.  In the ED vitals were stable.  Labs showed a creatinine of 1.3 with BUN of 30 with elevated troponin.  COVID-19 and flu was negative.  EKG  nonspecific ST depression in V4 through V6, which ST depression in V5/V6.  Similar to most recent prior EKG from 09/30/2020, and demonstrates no evidence of ST elevation.  In the ED patient received potassium and Zofran normal saline and was then considered for admission to the hospital for further evaluation and treatment.    Assessment and Plan: * Intractable nausea and vomiting-resolved as of 02/28/2021, (present on admission) On presentation.  Symptomatic care.  Diet has been advanced at this time.    COVID and influenza was negative.  Improved at this time.  Patient was encouraged oral hydration on discharge.  AKI (acute kidney injury) ([place])- (present on admission) Mild.  Likely secondary to nausea vomiting.  Prerenal azotemia noted.  Improved at this time.  Received IV fluid.  Latest  creatinine of 1.1.  Patient was encouraged oral hydration on discharge  Dehydration- (present on admission) Improved with IV fluids.  Will prescribe Zofran on discharge.  Was able to tolerate solid food.  Ambulated well without any issues  GERD (gastroesophageal reflux disease)- (present on admission) On Protonix.  Troponin level elevated- (present on admission) No chest pain.  No changes in the EKG. recent 2D echocardiogram with preserved LV function.  Acute coronary syndrome has been ruled out.  Paroxysmal atrial fibrillation ([place])- (present on admission) On Cardizem metoprolol p.o  CHA2DS2-VASc score of 5.  On chronic anticoagulation with Eliquis.  Controlled at this time.   Consultants: None Procedures performed: None Disposition: Home Diet recommendation:  Discharge Diet Orders (From admission, onward)     Start     Ordered   02/28/21 0000  Diet - low sodium heart healthy        02/28/21 1107           Cardiac diet  DISCHARGE MEDICATION: Allergies as of 02/28/2021       Reactions   Bactrim [sulfamethoxazole-trimethoprim] Diarrhea, Nausea And Vomiting   Bee Venom Anaphylaxis        Medication List     TAKE these medications    albuterol 108 (90 Base) MCG/ACT inhaler Commonly known as: VENTOLIN HFA Inhale 1 puff into the lungs every 6 (six) hours as needed for wheezing or shortness of breath.   apixaban 2.5 MG Tabs tablet Commonly known as: ELIQUIS Take 1 tablet (2.5 mg total) by mouth 2 (two) times daily.   atorvastatin 40 MG tablet Commonly known as: LIPITOR Take 1  tablet (40 mg total) by mouth at bedtime.   b complex vitamins tablet Take 1 tablet by mouth daily.   budesonide-formoterol 160-4.5 MCG/ACT inhaler Commonly known as: SYMBICORT Inhale 2 puffs into the lungs 2 (two) times daily as needed (shortness of breath or wheezing).   digoxin 0.125 MG tablet Commonly known as: LANOXIN TAKE 1 TABLET(0.125 MG) BY MOUTH DAILY   diltiazem 180 MG 24  hr capsule Commonly known as: CARDIZEM CD Take 180 mg by mouth daily.   DULoxetine 60 MG capsule Commonly known as: CYMBALTA Take 60 mg by mouth daily.   EPINEPHrine 0.3 mg/0.3 mL Soaj injection Commonly known as: EPI-PEN Inject 0.3 mg into the muscle as needed for anaphylaxis.   ferrous sulfate 325 (65 FE) MG EC tablet Take 1 tablet (325 mg total) by mouth daily with breakfast.   FeroSul 325 (65 FE) MG tablet Generic drug: ferrous sulfate Take 325 mg by mouth every morning.   folic acid 1 MG tablet Commonly known as: FOLVITE Take 1 mg by mouth daily.   metoprolol succinate 25 MG 24 hr tablet Commonly known as: TOPROL-XL Take 25 mg by mouth daily.   Nexlizet 180-10 MG Tabs Generic drug: Bempedoic Acid-Ezetimibe Take 1 tablet by mouth daily.   nicotine 21 mg/24hr patch Commonly known as: NICODERM CQ - dosed in mg/24 hours Place 1 patch (21 mg total) onto the skin daily.   ondansetron 4 MG tablet Commonly known as: Zofran Take 1 tablet (4 mg total) by mouth every 8 (eight) hours as needed for nausea or vomiting.   pantoprazole 40 MG tablet Commonly known as: PROTONIX Take 40 mg by mouth daily.   pregabalin 50 MG capsule Commonly known as: LYRICA Take 50 mg by mouth 2 (two) times daily.   Repatha 140 MG/ML Sosy Generic drug: Evolocumab INJECT 1ML UNDER THE SKIN EVERY 14 DAYS AS DIRECTED   tiotropium 18 MCG inhalation capsule Commonly known as: SPIRIVA Place 18 mcg into inhaler and inhale daily as needed (shortness of breath).   traMADol 50 MG tablet Commonly known as: Ultram Take 1 tablet (50 mg total) by mouth every 6 (six) hours as needed for moderate pain or severe pain (post-operatively).   Trelegy Ellipta 100-62.5-25 MCG/ACT Aepb Generic drug: Fluticasone-Umeclidin-Vilant Take 1 puff by mouth daily as needed (shortness of breath or wheezing).   Vortex Valved [employer] by Does not apply route.         Subjective.   Patient feels okay  today.  Was able to ambulate well.  Has been tolerating orals solid food.  Wishes to go home.  Denies chest pain shortness of breath fever  Discharge Exam: Filed Weights   02/27/21 1957  Weight: 44.8 kg   Body mass index is 18.66 kg/m.   General: Thinly built, not in obvious distress HENT:   No scleral pallor or icterus noted. Oral mucosa is moist.  Chest:  Clear breath sounds.  Diminished breath sounds bilaterally. No crackles or wheezes.  CVS: S1 &S2 heard. No murmur.  Regular rate and rhythm. Abdomen: Soft, nontender, nondistended.  Bowel sounds are heard.   Extremities: No cyanosis, clubbing or edema.  Peripheral pulses are palpable. Psych: Alert, awake and oriented, normal mood CNS:  No cranial nerve deficits.  Power equal in all extremities.   Skin: Warm and dry.  No rashes noted.  Condition at discharge: good  The results of significant diagnostics from this hospitalization (including imaging, microbiology, ancillary and laboratory) are listed below for reference.  Imaging Studies: CT CHEST WO CONTRAST  Result Date: 02/18/2021 CLINICAL DATA:  Asthma, acute EXAM: CT CHEST WITHOUT CONTRAST TECHNIQUE: Multidetector CT imaging of the chest was performed following the standard protocol without IV contrast. RADIATION DOSE REDUCTION: This exam was performed according to the departmental dose-optimization program which includes automated exposure control, adjustment of the mA and/or kV according to patient size and/or use of iterative reconstruction technique. COMPARISON:  Chest radiograph 02/18/2021 FINDINGS: Cardiovascular: Normal cardiac size.No pericardial disease.Mildly enlarged branch pulmonary arteries.Moderate to severe aortic arch atherosclerotic calcifications. Mediastinum/Nodes: No lymphadenopathy.The thyroid is unremarkable.Esophagus is unremarkable.The trachea is unremarkable. Lungs/Pleura: Diffuse mild bronchial wall thickening.There is interlobular septal thickening and  ground-glass opacities bilaterally, most prominent in the upper lungs and right middle and lower lobes.There are small bilateral pleural effusions, right greater than left.Bibasilar consolidations and ground-glass opacities. No pneumothorax. Upper Abdomen: There is a partially visualized left nephroureteral stent. No acute abnormality. Musculoskeletal: Multilevel degenerative changes of the spine. No acute osseous abnormality.No suspicious lytic or blastic lesions. IMPRESSION: Mild pulmonary edema and small bilateral pleural effusions with bibasilar consolidations and ground-glass opacities, concerning for superimposed multifocal pneumonia. Aortic Atherosclerosis (ICD10-I70.0). Electronically Signed   By: Maurine Simmering M.D.   On: 02/18/2021 17:47   DG CHEST PORT 1 VIEW  Result Date: 02/28/2021 CLINICAL DATA:  Postprocedural nausea and dizziness with decreased appetite since urinary procedure 02/17/2021 EXAM: PORTABLE CHEST 1 VIEW COMPARISON:  02/19/2021 FINDINGS: Chronic cardiomegaly. Stable mediastinal contours. Improved aeration. There is no edema, consolidation, effusion, or pneumothorax. Mild scar-like appearance at the left base. IMPRESSION: 1. Improved aeration from 9 days ago.  No acute finding. 2. Chronic cardiomegaly. Electronically Signed   By: Jorje Guild M.D.   On: 02/28/2021 07:28   DG Chest Port 1 View  Result Date: 02/19/2021 CLINICAL DATA:  Shortness of breath EXAM: PORTABLE CHEST 1 VIEW COMPARISON:  Previous studies including the examination of 02/18/2021 FINDINGS: Transverse diameter of heart is increased. There are no signs of alveolar pulmonary edema. Low position of diaphragms suggests possible COPD. There is haziness in both lower lung fields suggesting bilateral pleural effusions. Possibility of underlying infiltrates is not excluded. There is slight prominence of interstitial markings in the parahilar regions with interval improvement in the left parahilar region. There is no  pneumothorax. IMPRESSION: Cardiomegaly. Bilateral pleural effusions, more so on the right side. Possibility of underlying infiltrates is not excluded. There is slight improvement in aeration of left parahilar region, possibly suggesting decrease in interstitial edema. Electronically Signed   By: Elmer Picker M.D.   On: 02/19/2021 10:16   DG CHEST PORT 1 VIEW  Result Date: 02/18/2021 CLINICAL DATA:  COPD exacerbation (HCC) J44.1 (ICD-10-CM) EXAM: PORTABLE CHEST 1 VIEW COMPARISON:  09/05/2020. FINDINGS: Small left greater than right pleural effusions. Overlying bibasilar opacities. Mild interstitial prominence. No visible pneumothorax. Mild enlargement of the cardiac silhouette, similar. Calcific atherosclerosis of the aorta. IMPRESSION: 1. Small left greater than right pleural effusions. Overlying bibasilar opacities, which could represent atelectasis and/or pneumonia. 2. Mild interstitial prominence, which could represent the sequela of recurrent bouts of CHF versus mild interstitial edema. 3. Chronic cardiomegaly. Electronically Signed   By: Margaretha Sheffield M.D.   On: 02/18/2021 13:42   DG C-Arm 1-60 Min-No Report  Result Date: 02/17/2021 Fluoroscopy was utilized by the requesting physician.  No radiographic interpretation.   ECHOCARDIOGRAM COMPLETE  Result Date: 02/20/2021    ECHOCARDIOGRAM REPORT   Patient Name:   Kelly [name] Date of Exam: 02/19/2021 Medical Rec #:  [id number]      Height:       61.0 in Accession #:    [id number]     Weight:       103.8 lb Date of Birth:  [birth year]      BSA:          1.429 m Patient Age:    19 years       BP:           152/62 mmHg Patient Gender: F              HR:           98 bpm. Exam Location:  Inpatient Procedure: 2D Echo, Cardiac Doppler and Color Doppler Indications:     CHF-Acute Diastolic I94.85  History:         Patient has prior history of Echocardiogram examinations, most                  recent 06/18/2020. CHF, COPD; Risk Factors:Hypertension.   Sonographer:     Bernadene Person RDCS Referring Phys:  [id number] Jonnie Finner Diagnosing Phys: Vernell Leep MD IMPRESSIONS  1. Left ventricular ejection fraction, by estimation, is 65 to 70%. The left ventricle has hyperdynamic function. The left ventricle has no regional wall motion abnormalities. There is moderate left ventricular hypertrophy. Left ventricular diastolic parameters are indeterminate. Increased LVOT velocity (Vmax 11 mmHg) due to mild LVOT obstruction secondary to hyperdynamic LV. 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  2. Right ventricular systolic function is normal. The right ventricular size is normal. There is moderately elevated pulmonary artery systolic pressure. Estimated PASP 47 mmHg.  3. Left atrial size was severely dilated.  4. The mitral valve is normal in structure. Moderate mitral valve regurgitation. No evidence of mitral stenosis.  5. The aortic valve is normal in structure. Aortic valve regurgitation is not visualized. No aortic stenosis is present.  6. The inferior vena cava is normal in size with <50% respiratory variability, suggesting right atrial pressure of 8 mmHg.  7. Compared to previous outpatient study on 06/17/2020, tricuspid regurgitation and pulmonary hypertension are new findings. LVH is No other significant change noted. FINDINGS  Left Ventricle: Increased LVOT velocity (Vmax 11 mmHg) due to mild LVOT obstruction secondary to hyperdynamic LV. 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 Left ventricular ejection fraction, by estimation, is 65 to 70%. The left  ventricle has hyperdynamic function. The left ventricle has no regional wall motion abnormalities. The left ventricular internal cavity size was normal in size. There is moderate left ventricular hypertrophy. Left ventricular diastolic parameters are indeterminate. Right Ventricle: The right ventricular size is normal. No increase in right ventricular wall  thickness. Right ventricular systolic function is normal. There is moderately elevated pulmonary artery systolic pressure. The tricuspid regurgitant velocity is 3.13 m/s, and with an assumed right atrial pressure of 8 mmHg, the estimated right ventricular systolic pressure is 00.9 mmHg. Left Atrium: Left atrial size was severely dilated. Right Atrium: Right atrial size was normal in size. Pericardium: There is no evidence of pericardial effusion. Mitral Valve: The mitral valve is normal in structure. Moderate mitral valve regurgitation. No evidence of mitral valve stenosis. Tricuspid Valve: The tricuspid valve is normal in structure. Tricuspid valve regurgitation is mild . No evidence of tricuspid stenosis. Aortic Valve: The aortic valve is normal in structure. Aortic valve regurgitation is not visualized. No aortic stenosis is present. Pulmonic Valve: The pulmonic valve was normal in structure. Pulmonic valve regurgitation is not visualized.  No evidence of pulmonic stenosis. Aorta: The aortic root is normal in size and structure. Venous: The inferior vena cava is normal in size with less than 50% respiratory variability, suggesting right atrial pressure of 8 mmHg. IAS/Shunts: The interatrial septum was not assessed.  LEFT VENTRICLE PLAX 2D LVIDd:         3.60 cm LVIDs:         2.20 cm LV PW:         1.20 cm LV IVS:        1.20 cm LVOT diam:     1.90 cm LV SV:         108 LV SV Index:   75 LVOT Area:     2.84 cm  LV Volumes (MOD) LV vol d, MOD A2C: 70.6 ml LV vol d, MOD A4C: 57.3 ml LV vol s, MOD A2C: 24.3 ml LV vol s, MOD A4C: 18.1 ml LV SV MOD A2C:     46.3 ml LV SV MOD A4C:     57.3 ml LV SV MOD BP:      44.6 ml RIGHT VENTRICLE RV S prime:     12.40 cm/s TAPSE (M-mode): 3.2 cm LEFT ATRIUM             Index        RIGHT ATRIUM           Index LA diam:        4.30 cm 3.01 cm/m   RA Area:     14.40 cm LA Vol (A2C):   81.9 ml 57.30 ml/m  RA Volume:   29.80 ml  20.85 ml/m LA Vol (A4C):   70.3 ml 49.19 ml/m LA  Biplane Vol: 82.3 ml 57.58 ml/m  AORTIC VALVE LVOT Vmax:   164.00 cm/s LVOT Vmean:  124.000 cm/s LVOT VTI:    0.380 m  AORTA Ao Root diam: 3.40 cm Ao Asc diam:  3.60 cm MR Peak grad: 206.5 mmHg  TRICUSPID VALVE MR Mean grad: 125.0 mmHg  TR Peak grad:   39.2 mmHg MR Vmax:      718.50 cm/s TR Vmax:        313.00 cm/s MR Vmean:     531.0 cm/s                           SHUNTS                           Systemic VTI:  0.38 m                           Systemic Diam: 1.90 cm Vernell Leep MD Electronically signed by Vernell Leep MD Signature Date/Time: 02/20/2021/9:00:23 AM    Final     Microbiology: Results for orders placed or performed during the hospital encounter of 02/27/21  Resp Panel by RT-PCR (Flu A&B, Covid) Nasopharyngeal Swab     Status: None   Collection Time: 02/28/21  2:56 AM   Specimen: Nasopharyngeal Swab; Nasopharyngeal(NP) swabs in vial transport medium  Result Value Ref Range Status   SARS Coronavirus 2 by RT PCR NEGATIVE NEGATIVE Final    Comment: (NOTE) SARS-CoV-2 target nucleic acids are NOT DETECTED.  The SARS-CoV-2 RNA is generally detectable in upper respiratory specimens during the acute phase of infection. The lowest concentration of SARS-CoV-2 viral copies this assay can detect is 138 copies/mL. A  negative result does not preclude SARS-Cov-2 infection and should not be used as the sole basis for treatment or other patient management decisions. A negative result may occur with  improper specimen collection/handling, submission of specimen other than nasopharyngeal swab, presence of viral mutation(s) within the areas targeted by this assay, and inadequate number of viral copies(<138 copies/mL). A negative result must be combined with clinical observations, patient history, and epidemiological information. The expected result is Negative.  Fact Sheet for Patients:  EntrepreneurPulse.com.au  Fact Sheet for Healthcare Providers:   IncredibleEmployment.be  This test is no t yet approved or cleared by the [place] FDA and  has been authorized for detection and/or diagnosis of SARS-CoV-2 by FDA under an Emergency Use Authorization (EUA). This EUA will remain  in effect (meaning this test can be used) for the duration of the COVID-19 declaration under Section 564(b)(1) of the Act, 21 U.S.C.section 360bbb-3(b)(1), unless the authorization is terminated  or revoked sooner.       Influenza A by PCR NEGATIVE NEGATIVE Final   Influenza B by PCR NEGATIVE NEGATIVE Final    Comment: (NOTE) The Xpert Xpress SARS-CoV-2/FLU/RSV plus assay is intended as an aid in the diagnosis of influenza from Nasopharyngeal swab specimens and should not be used as a sole basis for treatment. Nasal washings and aspirates are unacceptable for Xpert Xpress SARS-CoV-2/FLU/RSV testing.  Fact Sheet for Patients: EntrepreneurPulse.com.au  Fact Sheet for Healthcare Providers: IncredibleEmployment.be  This test is not yet approved or cleared by the [place] FDA and has been authorized for detection and/or diagnosis of SARS-CoV-2 by FDA under an Emergency Use Authorization (EUA). This EUA will remain in effect (meaning this test can be used) for the duration of the COVID-19 declaration under Section 564(b)(1) of the Act, 21 U.S.C. section 360bbb-3(b)(1), unless the authorization is terminated or revoked.  Performed at [facility] [place] [address], [place], [place] [address]     Labs: CBC: Recent Labs  Lab 02/27/21 2019 02/28/21 0510  WBC 8.1 7.5  NEUTROABS 5.1 4.7  HGB 11.6* 9.8*  HCT 34.3* 29.2*  MCV 94.0 95.1  PLT 208 009   Basic Metabolic Panel: Recent Labs  Lab 02/27/21 2019 02/28/21 0510  NA 136 135  K 3.7 3.6  CL 98 99  CO2 27 28  GLUCOSE 109* 96  BUN 30* 26*  CREATININE 1.32* 1.18*  CALCIUM 9.1 8.3*  MG 2.0 1.8   Liver  Function Tests: Recent Labs  Lab 02/28/21 0510  AST 35  ALT 15  ALKPHOS 42  BILITOT 0.3  PROT 5.7*  ALBUMIN 3.3*   CBG: No results for input(s): GLUCAP in the last 168 hours.  Discharge time spent: greater than 30 minutes.  Signed: Flora Lipps, MD [facility] 02/28/2021

## 2021-02-28 NOTE — ED Notes (Signed)
Patients purewick misplaced to the side, patient cleaned up new brief applied. Patient instructed to use call bell when she needs to go.

## 2021-02-28 NOTE — Assessment & Plan Note (Addendum)
Mild.  Likely secondary to nausea vomiting.  Prerenal azotemia noted.  Improved at this time.  Received IV fluid.  Latest creatinine of 1.1.  Patient was encouraged oral hydration on discharge

## 2021-02-28 NOTE — H&P (Signed)
History and Physical    PLEASE NOTE THAT DRAGON DICTATION SOFTWARE WAS USED IN THE CONSTRUCTION OF THIS NOTE.   Kelly [name] XIP:[id number] DOB: 1940-12-11 DOA: 02/27/2021  PCP: Joya Gaskins, FNP  Patient coming from: home   I have personally briefly reviewed patient's old medical records in [place]  Chief Complaint: Nausea vomiting  HPI: Kelly [name] is a 81 y.o. female with medical history significant for paroxysmal atrial fibrillation chronically anticoagulated on Eliquis, GERD, who is admitted to [facility] on 02/27/2021 with intractable nausea/vomitingafter presenting from home to [facility] ED complaining of such.   Patient reports 3-4 episodes of nonbloody, nonbilious emesis over the last 2 days, significantly limiting her ability to tolerate food or water consumption over that timeframe.  She denies any associated chest pain, shortness of breath, nausea, vomiting, diaphoresis, palpitations, dizziness, presyncope, or syncope.  Neuro recent trauma or travel.  Denies any associated subjective fever, chills, rigors, generalized myalgias.  No recent abdominal pain, diarrhea, melena, hematochezia, or rash.  She also denies any recent dysuria, gross hematuria or change in urinary urgency/frequency.  Of note, she underwent cystoscopy with left ureteral stent placement on February 17, 2021.  Denies any worsening flank discomfort.  Her chart review, most recent prior serum creatinine level noted to be 1.06 on 02/21/2021.      ED Course:  Vital signs in the ED were notable for the following: Afebrile; heart rate 76-92; respiratory 15-17, oxygen saturation 95 to 96% on room air.  Labs were notable for the following: CMP notable for the following: BUN 30, creatinine 1.32, BUN/creatinine ratio 22.7, glucose 109.  CBC notable for blood cell count 8100, hemoglobin 11.6 compared to 10.9 on 02/21/2021.  High-sensitivity troponin I initially noted to be 82, with repeat value  trending up slightly to 94, relative to most recent prior value 28 in August 2022.  COVID-19/flu PCR negative.  Imaging and additional notable ED work-up: EKG shows sinus rhythm with heart rate 97, no evidence of T wave changes, nonspecific ST depression in V4 through V6, which ST depression in V5/V6.  Similar to most recent prior EKG from 09/30/2020, and demonstrates no evidence of ST elevation.  While in the ED, the following were administered: Full dose aspirin x1, Zofran 4 mg IV x1, potassium chloride 40 mg p.o. x1, normal saline x500 cc bolus.  Subsequently, the patient is admitted for overnight observation for further evaluation management of intractable nausea/vomiting, and further trending of troponin, complicated by presenting acute kidney injury in the setting of dehydration.     Review of Systems: As per HPI otherwise 10 point review of systems negative.   Past Medical History:  Diagnosis Date   Anemia    Anxiety    Arthritis    B12 deficiency 05/2015   B12 was 184   Cancer (HCC)    CHF (congestive heart failure) (HCC)    COPD (chronic obstructive pulmonary disease) (HCC)    Crohn's disease, small intestine ([place]) 02/19/2009   Depression 10/2013   chronic recurrent major depressive disorder   Dysrhythmia    H/O vitamin D deficiency 08/04/2008   Heart murmur    Hypertension    Hypothyroidism    Pneumonia    Recurrent dislocation of hip, right     Past Surgical History:  Procedure Laterality Date   CYSTOSCOPY WITH RETROGRADE PYELOGRAM, URETEROSCOPY AND STENT PLACEMENT Left 02/17/2021   Procedure: CYSTOSCOPY WITH  RETROGRADE PYELOGRAM, LEFT URETEROSCOPY , BIOPSY AND tumor ablation  STENT PLACEMENT;  Surgeon: Alexis Frock, MD;  Location: [facility];  Service: Urology;  Laterality: Left;   HOLMIUM LASER APPLICATION Left 09/24/3530   Procedure: HOLMIUM LASER APPLICATION;  Surgeon: Alexis Frock, MD;  Location: [facility];  Service: Urology;  Laterality: Left;   JOINT REPLACEMENT      Right hip, 2015   LAPAROSCOPY  06/03/2016   Duodenal ulcer repair   TONSILLECTOMY     TUBAL LIGATION      Social History:  reports that she has been smoking cigarettes. She has a 25.00 pack-year smoking history. She has never used smokeless tobacco. She reports current alcohol use. She reports that she does not use drugs.   Allergies  Allergen Reactions   Bactrim [Sulfamethoxazole-Trimethoprim] Diarrhea and Nausea And Vomiting   Bee Venom Anaphylaxis    Family History  Problem Relation Age of Onset   Heart disease Mother    Alcohol abuse Mother    Colon cancer Sister    Breast cancer Sister    Heart attack Brother    Cancer Neg Hx    Diabetes Neg Hx     Family history reviewed and not pertinent    Prior to Admission medications   Medication Sig Start Date End Date Taking? Authorizing Provider  albuterol (VENTOLIN HFA) 108 (90 Base) MCG/ACT inhaler Inhale 1 puff into the lungs every 6 (six) hours as needed for wheezing or shortness of breath.   Yes [provider]  apixaban (ELIQUIS) 2.5 MG TABS tablet Take 1 tablet (2.5 mg total) by mouth 2 (two) times daily. 02/09/21  Yes Cantwell, Celeste C, PA-C  atorvastatin (LIPITOR) 40 MG tablet Take 1 tablet (40 mg total) by mouth at bedtime. 09/30/20  Yes Cantwell, Celeste C, PA-C  b complex vitamins tablet Take 1 tablet by mouth daily.   Yes [provider]  budesonide-formoterol (SYMBICORT) 160-4.5 MCG/ACT inhaler Inhale 2 puffs into the lungs 2 (two) times daily as needed (shortness of breath or wheezing).   Yes [provider]  digoxin (LANOXIN) 0.125 MG tablet TAKE 1 TABLET(0.125 MG) BY MOUTH DAILY 12/31/20  Yes Cantwell, Celeste C, PA-C  diltiazem (CARDIZEM CD) 180 MG 24 hr capsule Take 180 mg by mouth daily.   Yes [provider]  DULoxetine (CYMBALTA) 60 MG capsule Take 60 mg by mouth daily. 08/19/20  Yes [provider]  EPINEPHrine 0.3 mg/0.3 mL IJ SOAJ injection Inject 0.3 mg into  the muscle as needed for anaphylaxis.   Yes [provider]  FEROSUL 325 (65 Fe) MG tablet Take 325 mg by mouth every morning. 02/13/21  Yes [provider]  folic acid (FOLVITE) 1 MG tablet Take 1 mg by mouth daily.   Yes [provider]  metoprolol succinate (TOPROL-XL) 25 MG 24 hr tablet Take 25 mg by mouth daily.   Yes [provider]  nicotine (NICODERM CQ - DOSED IN MG/24 HOURS) 21 mg/24hr patch Place 1 patch (21 mg total) onto the skin daily. 06/04/20  Yes Adrian Prows, MD  pantoprazole (PROTONIX) 40 MG tablet Take 40 mg by mouth daily.   Yes [provider]  pregabalin (LYRICA) 50 MG capsule Take 50 mg by mouth 2 (two) times daily.   Yes [provider]  REPATHA 140 MG/ML SOSY INJECT 1ML UNDER THE SKIN EVERY 14 DAYS AS DIRECTED 02/02/21  Yes Cantwell, Celeste C, PA-C  tiotropium (SPIRIVA) 18 MCG inhalation capsule Place 18 mcg into inhaler and inhale daily as needed (shortness of breath).   Yes [provider]  traMADol (ULTRAM) 50 MG tablet Take 1 tablet (50 mg total) by mouth every 6 (six) hours as needed for moderate pain or severe pain (post-operatively). 02/17/21 02/17/22 Yes Alexis Frock, MD  TRELEGY ELLIPTA 100-62.5-25 MCG/INH AEPB Take 1 puff by mouth daily as needed (shortness of breath or wheezing). 08/31/20  Yes [provider]  Bempedoic Acid-Ezetimibe (NEXLIZET) 180-10 MG TABS Take 1 tablet by mouth daily. Patient not taking: Reported on 02/10/2021 09/30/20   Lawerance Cruel C, PA-C  ferrous sulfate 325 (65 FE) MG EC tablet Take 1 tablet (325 mg total) by mouth daily with breakfast. Patient not taking: Reported on 02/28/2021 08/02/16   Martinique, Betty G, MD  Spacer/Aero-Holding Chambers ([place]) Sophia by Does not apply route.    [provider]     Objective    Physical Exam: Vitals:   02/28/21 0230 02/28/21 0400 02/28/21 0500 02/28/21 0530  BP: (!) 130/54 (!) 126/49 (!) 127/50  (!) 121/57  Pulse: 80 73 75 74  Resp: 14 14 (!) 22 12  Temp:      TempSrc:      SpO2: 92% 94% 93% 91%  Weight:      Height:        General: appears to be stated age; alert, oriented Skin: warm, dry, no rash Head:  AT/[place] Mouth:  Oral mucosa membranes appear dry, normal dentition Neck: supple; trachea midline Heart:  RRR; did not appreciate any M/R/G Lungs: CTAB, did not appreciate any wheezes, rales, or rhonchi Abdomen: + BS; soft, ND, NT Vascular: 2+ pedal pulses b/l; 2+ radial pulses b/l Extremities: no peripheral edema, no muscle wasting Neuro: strength and sensation intact in upper and lower extremities b/l    Labs on Admission: I have personally reviewed following labs and imaging studies  CBC: Recent Labs  Lab 02/27/21 2019 02/28/21 0510  WBC 8.1 7.5  NEUTROABS 5.1 4.7  HGB 11.6* 9.8*  HCT 34.3* 29.2*  MCV 94.0 95.1  PLT 208 222   Basic Metabolic Panel: Recent Labs  Lab 02/27/21 2019 02/28/21 0510  NA 136 135  K 3.7 3.6  CL 98 99  CO2 27 28  GLUCOSE 109* 96  BUN 30* 26*  CREATININE 1.32* 1.18*  CALCIUM 9.1 8.3*  MG 2.0 1.8   GFR: Estimated Creatinine Clearance: 26.4 mL/min (A) (by C-G formula based on SCr of 1.18 mg/dL (H)). Liver Function Tests: Recent Labs  Lab 02/28/21 0510  AST 35  ALT 15  ALKPHOS 42  BILITOT 0.3  PROT 5.7*  ALBUMIN 3.3*   No results for input(s): LIPASE, AMYLASE in the last 168 hours. No results for input(s): AMMONIA in the last 168 hours. Coagulation Profile: No results for input(s): INR, PROTIME in the last 168 hours. Cardiac Enzymes: No results for input(s): CKTOTAL, CKMB, CKMBINDEX, TROPONINI in the last 168 hours. BNP (last 3 results) No results for input(s): PROBNP in the last 8760 hours. HbA1C: No results for input(s): HGBA1C in the last 72 hours. CBG: No results for input(s): GLUCAP in the last 168 hours. Lipid Profile: No results for input(s): CHOL, HDL, LDLCALC, TRIG, CHOLHDL, LDLDIRECT in the last 72  hours. Thyroid Function Tests: No results for input(s): TSH, T4TOTAL, FREET4, T3FREE, THYROIDAB in the last 72 hours. Anemia Panel: No results for input(s): VITAMINB12, FOLATE, FERRITIN, TIBC, IRON, RETICCTPCT in the last 72 hours. Urine analysis:    Component Value Date/Time   COLORURINE RED (A) 09/13/2020 0851   APPEARANCEUR TURBID (A) 09/13/2020 9798  LABSPEC  09/13/2020 0851    TEST NOT REPORTED DUE TO COLOR INTERFERENCE OF URINE PIGMENT   PHURINE  09/13/2020 0851    TEST NOT REPORTED DUE TO COLOR INTERFERENCE OF URINE PIGMENT   GLUCOSEU (A) 09/13/2020 0851    TEST NOT REPORTED DUE TO COLOR INTERFERENCE OF URINE PIGMENT   HGBUR (A) 09/13/2020 0851    TEST NOT REPORTED DUE TO COLOR INTERFERENCE OF URINE PIGMENT   BILIRUBINUR (A) 09/13/2020 0851    TEST NOT REPORTED DUE TO COLOR INTERFERENCE OF URINE PIGMENT   KETONESUR (A) 09/13/2020 0851    TEST NOT REPORTED DUE TO COLOR INTERFERENCE OF URINE PIGMENT   PROTEINUR (A) 09/13/2020 0851    TEST NOT REPORTED DUE TO COLOR INTERFERENCE OF URINE PIGMENT   NITRITE (A) 09/13/2020 0851    TEST NOT REPORTED DUE TO COLOR INTERFERENCE OF URINE PIGMENT   LEUKOCYTESUR (A) 09/13/2020 0851    TEST NOT REPORTED DUE TO COLOR INTERFERENCE OF URINE PIGMENT    Radiological Exams on Admission: No results found.   EKG: Independently reviewed, with result as described above.    Assessment/Plan    Principal Problem:   Intractable nausea and vomiting Active Problems:   Paroxysmal atrial fibrillation (HCC)   Troponin level elevated   AKI (acute kidney injury) (HCC)   Dehydration   GERD (gastroesophageal reflux disease)    #) Intractable nausea/vomiting: Several episodes of nonbloody, nonbilious emesis over the last 2 days, limiting the patient's ability to tolerate p.o. intake over that timeframe, resulting in presenting dehydration and ensuing acute kidney injury.  Unclear source at this time, although recent history is notable for  cystoscopy with left ureteral stent placement on 02/17/2021.  Of note, COVID-19/influenza PCR negative.  Mild elevation in troponin is noted, however, presentation appears less suggestive of ACS or inferior wall MI, as further detailed below.   Plan: Check digoxin level.  Prn Zofran.  Lactated Ringer's at 50 cc/h x 8 hours.  Monitor strict I's and O's and daily weights redundancy magnesium level.  Repeat CMP in the morning.  Transient troponin.     #) Elevated troponin: Mildly elevated troponin of 82, 35 trending slightly to 94.  EKG, in comparison to most recent prior from September 2022 showed nonspecific ST depression in V4, otherwise no evidence of interval change, including no evidence of ST elevation and no evidence of acute ischemic changes involving the inferior leads, which is notable given her presenting nausea/vomiting.  No evidence of associated chest pain.  Overall, ACS is felt to be less likely given this clinical picture.  Suspect a degree of diminished renal clearance in the setting of concomitant acute kidney injury, dehydration, as above.  Plan: Continue to trend troponin.  Check chest x-ray.  Echocardiogram ordered for the morning.  Add on serum magnesium level.     #) Acute kidney injury: Presenting serum creatinine 1.32 relative to most recent prior value of 1.06 on 02/21/2021.  Appears prerenal in the setting of dehydration and increased GI losses in the form of presenting nausea/vomiting, with additional evidence of prerenal etiology in setting of acute prerenal azotemia.  Plan: Gentle overnight IV fluids, as above.  Check urinalysis with microscopy.  Add on Rams current symptoms.  Urine creatinine.  Check digoxin level.  Monitor strict I's and O's diabetes.  Tempt avoid nephrotoxic agents.       #) Paroxysmal atrial fibrillation: Documented history of such, on chronic anticoagulation via Eliquis in the setting of CHA2DS2-VASc score of 5.  Outpatient occasions also notable  for digoxin, the level of which will be checked in the setting present nausea/vomiting and diminished renal function, as well as diltiazem, Toprol-XL.  Plan: Check digoxin level before resumption of this medication.  Otherwise continue home AV nodal blocking regimen.  Continue home Eliquis.  Monitor on symmetry.      #) GERD: On Protonix as an outpatient.  Plan: Continue home PPI.     DVT prophylaxis: SCD's + home Eliquis Code Status: Full code Family Communication: none Disposition Plan: Per Rounding Team Consults called: none;  Admission status: Observation   PLEASE NOTE THAT DRAGON DICTATION SOFTWARE WAS USED IN THE CONSTRUCTION OF THIS NOTE.   [place] DO [facility] [place]   02/28/2021, 6:31 AM

## 2021-02-28 NOTE — Assessment & Plan Note (Addendum)
On Cardizem metoprolol p.o  CHA2DS2-VASc score of 5.  On chronic anticoagulation with Eliquis.  Controlled at this time.

## 2021-03-01 LAB — URINE CULTURE: Culture: 60000 — AB

## 2021-03-17 NOTE — Progress Notes (Signed)
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  Pt c/o SOB for the past 2 years, worse over the past few days. She states she gets SOB with exertion such as making her bed. She moved to [place] from [place] a year ago - exp to [place] with history of COPD, crohn's disease, CHF, hypothyroid, 25 py smoker, eliquis use  She never had covid.   Recently admitted following ureteroscopy with decompensated CHF, diuresed. Then another admission with ?ge.  Feels dyspneic at rest and certainly with exertion. No cough. Sometimes orthopnea.   She thinks only maybe 1 time this year that she's had a course of steroid.   She uses incruse daily. Using symbicort 2 puffs twice daily with spacer  Otherwise pertinent review of systems is negative.  Living with daughter and son-in-law here in [place]. From [place] originally, [place] flow with ash in the air starting a couple years ago. Down to 5 cigarettes per day. 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   Past Medical History:  Diagnosis Date   Anemia    Anxiety    Arthritis    B12 deficiency 05/2015   B12 was 184   Cancer (HCC)    CHF (congestive heart failure) (HCC)    COPD (chronic obstructive pulmonary disease) (HCC)    Crohn's disease, small intestine ([place]) 02/19/2009   Depression 10/2013   chronic recurrent major depressive disorder   Dysrhythmia    H/O vitamin D deficiency 08/04/2008   Heart murmur    Hypertension    Hypothyroidism    Pneumonia    Recurrent dislocation of hip, right      Family History  Problem Relation Age of Onset   Heart disease Mother    Alcohol abuse Mother    Colon cancer Sister    Breast cancer Sister    Heart attack Brother     Cancer Neg Hx    Diabetes Neg Hx      Past Surgical History:  Procedure Laterality Date   CYSTOSCOPY WITH RETROGRADE PYELOGRAM, URETEROSCOPY AND STENT PLACEMENT Left 02/17/2021   Procedure: CYSTOSCOPY WITH  RETROGRADE PYELOGRAM, LEFT URETEROSCOPY , BIOPSY AND tumor ablation  STENT PLACEMENT;  Surgeon: Alexis Frock, MD;  [facility];  Service: Urology;  Laterality: Left;   HOLMIUM LASER APPLICATION Left 05/25/5275   Procedure: HOLMIUM LASER APPLICATION;  Surgeon: Alexis Frock, MD;  [facility];  Service: Urology;  Laterality: Left;   JOINT REPLACEMENT     Right hip, 2015   LAPAROSCOPY  06/03/2016   Duodenal ulcer repair   TONSILLECTOMY     TUBAL LIGATION      Social History   Socioeconomic History   Marital status: Married    Spouse name: Not on file   Number of children: Not on file   Years of education: Not on file   Highest education level: Not on file  Occupational History   Not on file  Tobacco Use   Smoking status: Every Day    Packs/day: 1.00    Years: 58.00    Pack years: 58.00    Types: Cigarettes   Smokeless tobacco: Never   Tobacco comments:    trying  to quit, wearing nicotine patch. Cautioned against smoking and wearing patch.1/4 ppd 03/18/21  Vaping Use   Vaping Use: Never used  Substance and Sexual Activity   Alcohol use: Yes    Comment: OCC   Drug use: No   Sexual activity: Not Currently  Other Topics Concern   Not on file  Social History Narrative   Not on file   Social Determinants of Health   Financial Resource Strain: Not on file  Food Insecurity: Not on file  Transportation Needs: Not on file  Physical Activity: Not on file  Stress: Not on file  Social Connections: Not on file  Intimate Partner Violence: Not on file     Allergies  Allergen Reactions   Bactrim [Sulfamethoxazole-Trimethoprim] Diarrhea and Nausea And Vomiting   Bee Venom Anaphylaxis     Outpatient Medications Prior to Visit  Medication Sig Dispense Refill    apixaban (ELIQUIS) 2.5 MG TABS tablet Take 1 tablet (2.5 mg total) by mouth 2 (two) times daily. 180 tablet 1   atorvastatin (LIPITOR) 40 MG tablet Take 1 tablet (40 mg total) by mouth at bedtime. 90 tablet 3   b complex vitamins tablet Take 1 tablet by mouth daily.     budesonide-formoterol (SYMBICORT) 160-4.5 MCG/ACT inhaler Inhale 2 puffs into the lungs 2 (two) times daily as needed (shortness of breath or wheezing).     digoxin (LANOXIN) 0.125 MG tablet TAKE 1 TABLET(0.125 MG) BY MOUTH DAILY 90 tablet 0   DULoxetine (CYMBALTA) 60 MG capsule Take 60 mg by mouth daily.     EPINEPHrine 0.3 mg/0.3 mL IJ SOAJ injection Inject 0.3 mg into the muscle as needed for anaphylaxis.     ferrous sulfate 325 (65 FE) MG EC tablet Take 1 tablet (325 mg total) by mouth daily with breakfast. 90 tablet 1   folic acid (FOLVITE) 1 MG tablet Take 1 mg by mouth daily.     metoprolol succinate (TOPROL-XL) 25 MG 24 hr tablet Take 25 mg by mouth daily.     nicotine (NICODERM CQ - DOSED IN MG/24 HOURS) 21 mg/24hr patch Place 1 patch (21 mg total) onto the skin daily. 28 patch 0   ondansetron (ZOFRAN) 4 MG tablet Take 1 tablet (4 mg total) by mouth every 8 (eight) hours as needed for nausea or vomiting. 15 tablet 1   pantoprazole (PROTONIX) 40 MG tablet Take 40 mg by mouth daily.     pregabalin (LYRICA) 50 MG capsule Take 50 mg by mouth 2 (two) times daily.     REPATHA 140 MG/ML SOSY INJECT 1ML UNDER THE SKIN EVERY 14 DAYS AS DIRECTED 2 mL 3   Spacer/Aero-Holding Chambers (VORTEX VALVED HOLDING CHAMBER) DEVI by Does not apply route.     umeclidinium bromide (INCRUSE ELLIPTA) 62.5 MCG/ACT AEPB Inhale 1 puff into the lungs daily.     albuterol (VENTOLIN HFA) 108 (90 Base) MCG/ACT inhaler Inhale 1 puff into the lungs every 6 (six) hours as needed for wheezing or shortness of breath.     Bempedoic Acid-Ezetimibe (NEXLIZET) 180-10 MG TABS Take 1 tablet by mouth daily. (Patient not taking: Reported on 02/10/2021) 30 tablet 3    diltiazem (CARDIZEM CD) 180 MG 24 hr capsule Take 180 mg by mouth daily.     FEROSUL 325 (65 Fe) MG tablet Take 325 mg by mouth every morning.     tiotropium (SPIRIVA) 18 MCG inhalation capsule Place 18 mcg into inhaler and inhale daily as needed (shortness of breath).  traMADol (ULTRAM) 50 MG tablet Take 1 tablet (50 mg total) by mouth every 6 (six) hours as needed for moderate pain or severe pain (post-operatively). 10 tablet 0   TRELEGY ELLIPTA 100-62.5-25 MCG/INH AEPB Take 1 puff by mouth daily as needed (shortness of breath or wheezing).     No facility-administered medications prior to visit.       Objective:   Physical Exam:  General appearance: 81 y.o., female, NAD, conversant  Eyes: anicteric sclerae; PERRL, tracking appropriately HENT: NCAT; MMM Neck: Trachea midline; no lymphadenopathy, no JVD Lungs: diminished bl, no crackles, no wheeze, with normal respiratory effort CV: RRR, loud systolic murmur Abdomen: Soft, non-tender; non-distended, BS present  Extremities: No peripheral edema, warm Skin: Normal turgor and texture; no rash Psych: Appropriate affect Neuro: Alert and oriented to person and place, no focal deficit     Vitals:   03/18/21 1002  BP: 106/60  Pulse: (!) 102  Temp: 97.8 F (36.6 C)  TempSrc: Oral  SpO2: 98%  Weight: 87 lb (39.5 kg)  Height: 5\' 1"  (1.549 m)   98% on RA BMI Readings from Last 3 Encounters:  03/18/21 16.44 kg/m  02/27/21 18.66 kg/m  02/21/21 18.69 kg/m   Wt Readings from Last 3 Encounters:  03/18/21 87 lb (39.5 kg)  02/27/21 98 lb 12.3 oz (44.8 kg)  02/21/21 98 lb 14.4 oz (44.9 kg)     CBC    Component Value Date/Time   WBC 7.5 02/28/2021 0510   RBC 3.07 (L) 02/28/2021 0510   HGB 9.8 (L) 02/28/2021 0510   HCT 29.2 (L) 02/28/2021 0510   PLT 184 02/28/2021 0510   MCV 95.1 02/28/2021 0510   MCH 31.9 02/28/2021 0510   MCHC 33.6 02/28/2021 0510   RDW 14.0 02/28/2021 0510   LYMPHSABS 1.5 02/28/2021 0510    MONOABS 0.9 02/28/2021 0510   EOSABS 0.3 02/28/2021 0510   BASOSABS 0.1 02/28/2021 0510      Chest Imaging: CT Chest 02/18/21 reviewed by me with interlobular septal thickening bilateral R>L effusions, bronchial wall thickening  Pulmonary Functions Testing Results: No flowsheet data found.    Echocardiogram:   TTE 02/28/21:  1. 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 Marland Kitchen Left ventricular ejection fraction, by estimation, is 70 to  75%. The left ventricle has  hyperdynamic function. The left ventricle has no regional wall motion  abnormalities. There is severe concentric left ventricular hypertrophy.  Left ventricular diastolic parameters are consistent with Grade III  diastolic dysfunction (restrictive).  Elevated left ventricular end-diastolic pressure.   2. Right ventricular systolic function is normal. The right ventricular  size is normal. There is normal pulmonary artery systolic pressure.   3. Left atrial size was severely dilated.   4. The mitral valve is degenerative. Moderate to severe mitral valve  regurgitation. No evidence of mitral stenosis.   5. The aortic valve is tricuspid. Aortic valve regurgitation is trivial.  Aortic valve sclerosis is present, with no evidence of aortic valve  stenosis.   6. The inferior vena cava is normal in size with greater than 50%  respiratory variability, suggesting right atrial pressure of 3 mmHg.      Assessment & Plan:   # Presumed COPD gold functional group B:  # Smoking Discussed risks of ongoing smoking and lung cancer risk reduction, potential for cough/dyspnea improvement with cessation.  Plan: - start  low dose trelegy 1 puff once daily for simplification of regimen, rinse mouth after use, instructed in use - albuterol inhaler as needed - PFTs next visit in 6 weeks - will ensure she has close follow up for  her HOCM with Dr. Einar Gip - chantix prescribed  - will think about pulmonary rehab   RTC 6 weeks  Maryjane Hurter, MD [facility] 03/18/2021 10:21 AM

## 2021-03-18 ENCOUNTER — Other Ambulatory Visit: Payer: Self-pay | Admitting: Student

## 2021-03-18 ENCOUNTER — Encounter: Payer: Self-pay | Admitting: Student

## 2021-03-18 ENCOUNTER — Ambulatory Visit (INDEPENDENT_AMBULATORY_CARE_PROVIDER_SITE_OTHER): Payer: Medicare Other | Admitting: Student

## 2021-03-18 ENCOUNTER — Other Ambulatory Visit: Payer: Self-pay

## 2021-03-18 VITALS — BP 106/60 | HR 102 | Temp 97.8°F | Ht 61.0 in | Wt 87.0 lb

## 2021-03-18 DIAGNOSIS — J449 Chronic obstructive pulmonary disease, unspecified: Secondary | ICD-10-CM | POA: Diagnosis not present

## 2021-03-18 DIAGNOSIS — F17200 Nicotine dependence, unspecified, uncomplicated: Secondary | ICD-10-CM

## 2021-03-18 MED ORDER — TRELEGY ELLIPTA 100-62.5-25 MCG/ACT IN AEPB: 1.0000 | INHALATION_SPRAY | Freq: Every day | RESPIRATORY_TRACT | 11 refills | Status: AC

## 2021-03-18 MED ORDER — ALBUTEROL SULFATE HFA 108 (90 BASE) MCG/ACT IN AERS: 2.0000 | INHALATION_SPRAY | Freq: Four times a day (QID) | RESPIRATORY_TRACT | 6 refills | Status: AC | PRN

## 2021-03-18 MED ORDER — VARENICLINE TARTRATE 1 MG PO TABS: 1.0000 mg | ORAL_TABLET | Freq: Two times a day (BID) | ORAL | 1 refills | Status: DC

## 2021-03-18 MED ORDER — VARENICLINE TARTRATE 0.5 MG X 11 & 1 MG X 42 PO TBPK: ORAL_TABLET | ORAL | 0 refills | Status: DC

## 2021-03-18 MED ORDER — TRELEGY ELLIPTA 100-62.5-25 MCG/ACT IN AEPB: 1.0000 | INHALATION_SPRAY | Freq: Every day | RESPIRATORY_TRACT | 0 refills | Status: DC

## 2021-03-18 NOTE — Patient Instructions (Addendum)
- STOP taking symbicort, incruse, spiriva ?- START taking trelegy 1 puff once daily, rinse mouth after use ?- START taking chantix - see prescription instructions to gradually increase dose. Stop smoking after first week of chantix. If you have nightmares then skip evening dose ?- think about pulmonary rehab ?- see you in 6 weeks with breathing tests (PFTs) ? ? ? ?Pulmonary Rehabilitation ?Pulmonary rehabilitation, also called pulmonary rehab, is a program that helps people manage their breathing problems. The main goals are to increase endurance, reduce breathlessness, and improve quality of life. Pulmonary rehab can last 4-12 weeks or more, depending on your condition. ?You may need pulmonary rehab if: ?You are recovering from lung surgery. ?You have ongoing (chronic) lung problems or a condition that makes it hard to breathe, such as: ?A disease that causes scars in lung tissue (interstitial lung disease), including idiopathic pulmonary fibrosis and sarcoidosis. ?Chronic obstructive pulmonary disease (COPD). ?Cystic fibrosis. ?Diseases that affect the muscles used for breathing, such as muscular dystrophy. ?Benefits of pulmonary rehab ?Pulmonary rehab may help you: ?Increase your ability to exercise. ?Reduce breathing problems. ?Quit smoking. ?Learn how to use oxygen therapy. ?Manage and understand your medicines and treatment. ?Teach your family about your condition and how to take part in your recovery. ?Get support from health experts as well as other people with similar problems. ?Other benefits include: ?Learning how to eat a healthy diet. ?Managing a healthy weight. ?Managing anxiety and depression. ?Tell a health care provider about: ?Any allergies you have. ?All medicines you are taking, including vitamins, herbs, eye drops, creams, and over-the-counter medicines. ?Any bleeding problems you have. ?Any surgeries you have had. ?Any medical conditions you have. ?Whether you are pregnant or may be  pregnant. ?What are the risks? ?Generally, pulmonary rehab is safe. However, problems may occur, including: ?Exercise-related injuries. ?Higher risk for heart attack or stroke in some cases, due to certain types of exercise. This is rare. ?What happens before treatment? ?You may have a physical exam in which your health care provider may: ?Do blood tests. ?Test how well you can breathe and how your lungs function. ?Test your ability to exercise, such as how long you can walk on a treadmill. ?Your health care providers will work with you to make a treatment plan based on your health and your goals. Your program will be tailored to fit your needs and may change as you make progress. ?What happens during treatment? ?Exercise training ?Exercise training involves doing physical activity, such as: ?Stretching routines. ?Strength-building exercises with weights. You will work on becoming stronger in both your arms and legs. ?Aerobic exercises to improve endurance. You may use a stationary bike or treadmill. ?Exercise training will vary based on how much activity you can handle, and it will slowly become harder or more intense as you build up your endurance. In most cases, you will have exercise training 3 days a week. ?After you finish pulmonary rehab, your health care provider will create an exercise program to help you maintain your progress. ?Education ?Your rehab team will teach you about your disease and ways you can manage symptoms. You may have one-on-one sessions or group meetings. You may learn: ?How to avoid situations that can worsen symptoms. ?When and how to take your medicines. ?How to prevent lung infections. ?How to quit smoking. ?How to use oxygen therapy. ?Nutrition support ?Being overweight or underweight can affect your breathing. You may meet with nutritionists to come up with the right diet for you. This may  include: ?A healthy eating plan to help you lose weight. ?Adding calorie or protein  supplements to help you gain weight or avoid losing weight. ?Breathing training ?Breathing exercises can help you better control your breathing by taking deeper breaths, less often. Breathing exercises may include: ?Pursed-lip breathing. During this technique, you will inhale through your nose and exhale for 4-6 seconds through pursed lips, such as a kissing or whistling position. ?Belly breathing (diaphragmatic breathing). During this technique, you place your hands on your stomach and inhale through your nose. As you breathe in, you should feel your belly rise as air fills your diaphragm. Then, you exhale slowly through pursed lips as you feel your stomach falling. ? ?Energy conservation training ?Your rehab team will show you how to complete everyday tasks without getting out of breath. This may include techniques for avoiding bending, lifting, or reaching. ?Counseling ?You may receive individual or group counseling during pulmonary rehab. These meetings can help with any anxiety, depression, or frustrations you may be feeling. Counseling may include: ?Muscle relaxation exercises. ?Techniques for dealing with stress or panic. ?Your family members and caregivers may also participate in counseling. ?What can I expect after the treatment? ?Follow these instructions at home: ?Exercise regularly as told by your health care providers. Your health care providers will create an exercise plan to help you maintain your endurance and your overall health. ?Do not use any products that contain nicotine or tobacco. These products include cigarettes, chewing tobacco, and vaping devices, such as e-cigarettes. If you need help quitting, ask your health care provider. ?Take over-the-counter and prescription medicines only as told by your health care provider. ?Keep all follow-up visits. This is important. ?Contact a health care provider if: ?You have questions about your program. ?You do not notice any improvement in your  breathing or endurance. ?You develop new or worse symptoms. ?You have a fever or chills. ?Get help right away if: ?You have shortness of breath when you are sitting still or lying down. ?You feel dizzy or you faint. ?Summary ?Pulmonary rehabilitation is a program that helps people manage their breathing problems. Your program will be tailored to you. ?The main goals of pulmonary rehab are to increase endurance, reduce breathlessness, and improve quality of life. ?Once you finish rehab, your health care providers will create an exercise plan to help you maintain your progress. ?Do not use any products that contain nicotine or tobacco. These products include cigarettes, chewing tobacco, and vaping devices, such as e-cigarettes. If you need help quitting, ask your health care provider. ?This information is not intended to replace advice given to you by your health care provider. Make sure you discuss any questions you have with your health care provider. ?Document Revised: 08/25/2020 Document Reviewed: 08/25/2020 ?Elsevier Patient Education ? [place]. ? ?

## 2021-04-03 ENCOUNTER — Other Ambulatory Visit: Payer: Self-pay | Admitting: Student

## 2021-05-03 NOTE — Progress Notes (Signed)
? 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 ?Subjective:  ? ?PATIENT ID: Kelly [name] GENDER: female DOB: [birth year], MRN: [id number] ? ?Chief Complaint  ?Patient presents with  ? Follow-up  ?  PFT's done today. Breathing has improved on trelegy. She is able to do more without getting as winded. She is using nicotine patch, down to about 3 cigs per day.   ? ?81yF with history of ACOS, crohn's disease, CHF, hypothyroid, 25 py smoker, eliquis use ? ?She never had covid.  ? ?Recently admitted following ureteroscopy with decompensated CHF, diuresed. Then another admission with ?ge. ? ?Feels dyspneic at rest and certainly with exertion. No cough. Sometimes orthopnea.  ? ?She thinks only maybe 1 time this year that she's had a course of steroid.  ? ?She uses incruse daily. Using symbicort 2 puffs twice daily with spacer ? ?Otherwise pertinent review of systems is negative. ? ?Living with daughter and son-in-law here in [place]. From [place] originally, [place] with ash in the air starting a couple years ago. Down to 5 cigarettes per day. 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  ? ?Interval HPI: ?Started on trelegy last visit and chantix, PFTs today.  ? ?Taking trelegy 1 puff once daily, rinsing mouth after use. Has greater energy with it. Mild cough.  ? ?Using patches to try to help stop smoking. She is down to about 3 cigarettes per day.  ? ?Otherwise pertinent review of systems is negative. ? ?Past Medical History:  ?Diagnosis Date  ? Anemia   ? Anxiety   ? Arthritis   ? B12 deficiency 05/2015  ? B12 was 184  ? Cancer [facility])   ? CHF (congestive heart failure) ([place])   ? COPD (chronic obstructive pulmonary disease) ([place])   ? Crohn's disease, small intestine ([place]) 02/19/2009  ? Depression 10/2013  ? chronic recurrent major depressive disorder  ? Dysrhythmia   ? H/O vitamin D deficiency 08/04/2008  ? Heart murmur   ?  Hypertension   ? Hypothyroidism   ? Pneumonia   ? Recurrent dislocation of hip, right   ?  ? ?Family History  ?Problem Relation Age of Onset  ? Heart disease Mother   ? Alcohol abuse Mother   ? Colon cancer Sister   ? Breast cancer Sister   ? Heart attack Brother   ? Cancer Neg Hx   ? Diabetes Neg Hx   ?  ? ?Past Surgical History:  ?Procedure Laterality Date  ? CYSTOSCOPY WITH RETROGRADE PYELOGRAM, URETEROSCOPY AND STENT PLACEMENT Left 02/17/2021  ? Procedure: CYSTOSCOPY WITH  RETROGRADE PYELOGRAM, LEFT URETEROSCOPY , BIOPSY AND tumor ablation  STENT PLACEMENT;  Surgeon: Alexis Frock, MD;  Location: [facility];  Service: Urology;  Laterality: Left;  ? HOLMIUM LASER APPLICATION Left 04/17/9377  ? Procedure: HOLMIUM LASER APPLICATION;  Surgeon: Alexis Frock, MD;  Location: [facility];  Service: Urology;  Laterality: Left;  ? JOINT REPLACEMENT    ? Right hip, 2015  ? LAPAROSCOPY  06/03/2016  ? Duodenal ulcer repair  ? TONSILLECTOMY    ? TUBAL LIGATION    ? ? ?Social History  ? ?Socioeconomic History  ? Marital status: Married  ?  Spouse name: Not on file  ? Number of children: Not on file  ? Years of education: Not on file  ? Highest education level: Not on file  ?Occupational History  ? Not on file  ?  Tobacco Use  ? Smoking status: Every Day  ?  Packs/day: 1.00  ?  Years: 58.00  ?  Pack years: 58.00  ?  Types: Cigarettes  ? Smokeless tobacco: Never  ? Tobacco comments:  ?  trying to quit, wearing nicotine patch. Cautioned against smoking and wearing patch.1/4 ppd 03/18/21  ?Vaping Use  ? Vaping Use: Never used  ?Substance and Sexual Activity  ? Alcohol use: Yes  ?  Comment: OCC  ? Drug use: No  ? Sexual activity: Not Currently  ?Other Topics Concern  ? Not on file  ?Social History Narrative  ? Not on file  ? ?Social Determinants of Health  ? ?Financial Resource Strain: Not on file  ?Food Insecurity: Not on file  ?Transportation Needs: Not on file  ?Physical Activity: Not on file  ?Stress: Not on file  ?Social Connections:  Not on file  ?Intimate Partner Violence: Not on file  ?  ? ?Allergies  ?Allergen Reactions  ? Bactrim [Sulfamethoxazole-Trimethoprim] Diarrhea and Nausea And Vomiting  ? Bee Venom Anaphylaxis  ?  ? ?Outpatient Medications Prior to Visit  ?Medication Sig Dispense Refill  ? albuterol (VENTOLIN HFA) 108 (90 Base) MCG/ACT inhaler Inhale 2 puffs into the lungs every 6 (six) hours as needed for wheezing or shortness of breath. 8 g 6  ? apixaban (ELIQUIS) 2.5 MG TABS tablet Take 1 tablet (2.5 mg total) by mouth 2 (two) times daily. 180 tablet 1  ? atorvastatin (LIPITOR) 40 MG tablet Take 1 tablet (40 mg total) by mouth at bedtime. 90 tablet 3  ? b complex vitamins tablet Take 1 tablet by mouth daily.    ? digoxin (LANOXIN) 0.125 MG tablet TAKE 1 TABLET(0.125 MG) BY MOUTH DAILY 90 tablet 0  ? DULoxetine (CYMBALTA) 60 MG capsule Take 60 mg by mouth daily.    ? EPINEPHrine 0.3 mg/0.3 mL IJ SOAJ injection Inject 0.3 mg into the muscle as needed for anaphylaxis.    ? ferrous sulfate 325 (65 FE) MG EC tablet Take 1 tablet (325 mg total) by mouth daily with breakfast. 90 tablet 1  ? Fluticasone-Umeclidin-Vilant (TRELEGY ELLIPTA) 100-62.5-25 MCG/ACT AEPB Inhale 1 puff into the lungs daily. 1 each 11  ? Fluticasone-Umeclidin-Vilant (TRELEGY ELLIPTA) 100-62.5-25 MCG/ACT AEPB Inhale 1 puff into the lungs daily. 14 each 0  ? folic acid (FOLVITE) 1 MG tablet Take 1 mg by mouth daily.    ? metoprolol succinate (TOPROL-XL) 25 MG 24 hr tablet Take 25 mg by mouth daily.    ? nicotine (NICODERM CQ - DOSED IN MG/24 HOURS) 21 mg/24hr patch Place 1 patch (21 mg total) onto the skin daily. 28 patch 0  ? ondansetron (ZOFRAN) 4 MG tablet Take 1 tablet (4 mg total) by mouth every 8 (eight) hours as needed for nausea or vomiting. 15 tablet 1  ? pantoprazole (PROTONIX) 40 MG tablet Take 40 mg by mouth daily.    ? pregabalin (LYRICA) 50 MG capsule Take 50 mg by mouth 2 (two) times daily.    ? REPATHA 140 MG/ML SOSY INJECT 1ML UNDER THE SKIN EVERY 14  DAYS AS DIRECTED 2 mL 3  ? Spacer/Aero-Holding Chambers (VORTEX VALVED HOLDING CHAMBER) DEVI by Does not apply route.    ? varenicline (CHANTIX PAK) 0.5 MG X 11 & 1 MG X 42 tablet Take one 0.5 mg tablet by mouth once daily for 3 days, then increase to one 0.5 mg tablet twice daily for 4 days, then increase to one 1 mg tablet twice  daily. (Patient not taking: Reported on 05/05/2021) 53 tablet 0  ? varenicline (CHANTIX) 1 MG tablet TAKE 1 TABLET(1 MG) BY MOUTH TWICE DAILY (Patient not taking: Reported on 05/05/2021) 180 tablet 0  ? ?No facility-administered medications prior to visit.  ? ? ? ? ? ?Objective:  ? ?Physical Exam: ? ?General appearance: 80 y.o., female, NAD, conversant  ?Eyes: anicteric sclerae; PERRL, tracking appropriately ?HENT: NCAT; MMM ?Neck: Trachea midline; no lymphadenopathy, no JVD ?Lungs: diminished bl, no crackles, no wheeze, with normal respiratory effort ?CV: RRR, loud systolic murmur with radiation to her neck ?Abdomen: Soft, non-tender; non-distended, BS present  ?Extremities: No peripheral edema, warm ?Skin: Normal turgor and texture; no rash ?Psych: Appropriate affect ?Neuro: Alert and oriented to person and place, no focal deficit  ? ? ? ?Vitals:  ? 05/05/21 1009  ?BP: 108/64  ?[name]: 71  ?Temp: 97.7 ?F (36.5 ?C)  ?TempSrc: Oral  ?SpO2: 98%  ?Weight: 91 lb (41.3 kg)  ?Height: 5' 1.5" (1.562 m)  ? ? ?98% on RA ?BMI Readings from Last 3 Encounters:  ?05/05/21 16.92 kg/m?  ?03/18/21 16.44 kg/m?  ?02/27/21 18.66 kg/m?  ? ?Wt Readings from Last 3 Encounters:  ?05/05/21 91 lb (41.3 kg)  ?03/18/21 87 lb (39.5 kg)  ?02/27/21 98 lb 12.3 oz (44.8 kg)  ? ? ? ?CBC ?   ?Component Value Date/Time  ? WBC 7.5 02/28/2021 0510  ? RBC 3.07 (L) 02/28/2021 0510  ? HGB 9.8 (L) 02/28/2021 0510  ? HCT 29.2 (L) 02/28/2021 0510  ? PLT 184 02/28/2021 0510  ? MCV 95.1 02/28/2021 0510  ? MCH 31.9 02/28/2021 0510  ? MCHC 33.6 02/28/2021 0510  ? RDW 14.0 02/28/2021 0510  ? LYMPHSABS 1.5 02/28/2021 0510  ? MONOABS 0.9  02/28/2021 0510  ? EOSABS 0.3 02/28/2021 0510  ? BASOSABS 0.1 02/28/2021 0510  ? ? ? ? ?Chest Imaging: ?CT Chest 02/18/21 reviewed by me with interlobular septal thickening bilateral R>L effusions, bronchial wall

## 2021-05-05 ENCOUNTER — Encounter: Payer: Self-pay | Admitting: Student

## 2021-05-05 ENCOUNTER — Ambulatory Visit (INDEPENDENT_AMBULATORY_CARE_PROVIDER_SITE_OTHER): Payer: Medicare Other | Admitting: Student

## 2021-05-05 VITALS — BP 108/64 | HR 71 | Temp 97.7°F | Ht 61.5 in | Wt 91.0 lb

## 2021-05-05 DIAGNOSIS — J449 Chronic obstructive pulmonary disease, unspecified: Secondary | ICD-10-CM | POA: Diagnosis not present

## 2021-05-05 LAB — PULMONARY FUNCTION TEST
DL/VA % pred: 79 %
DL/VA: 3.3 ml/min/mmHg/L
DLCO cor % pred: 59 %
DLCO cor: 10.23 ml/min/mmHg
DLCO unc % pred: 59 %
DLCO unc: 10.23 ml/min/mmHg
FEF 25-75 Post: 1.29 L/sec
FEF 25-75 Pre: 0.67 L/sec
FEF2575-%Change-Post: 93 %
FEF2575-%Pred-Post: 106 %
FEF2575-%Pred-Pre: 54 %
FEV1-%Change-Post: 17 %
FEV1-%Pred-Post: 74 %
FEV1-%Pred-Pre: 63 %
FEV1-Post: 1.24 L
FEV1-Pre: 1.06 L
FEV1FVC-%Change-Post: 5 %
FEV1FVC-%Pred-Pre: 97 %
FEV6-%Change-Post: 12 %
FEV6-%Pred-Post: 77 %
FEV6-%Pred-Pre: 68 %
FEV6-Post: 1.65 L
FEV6-Pre: 1.47 L
FEV6FVC-%Pred-Post: 106 %
FEV6FVC-%Pred-Pre: 106 %
FVC-%Change-Post: 11 %
FVC-%Pred-Post: 72 %
FVC-%Pred-Pre: 65 %
FVC-Post: 1.65 L
FVC-Pre: 1.48 L
Post FEV1/FVC ratio: 75 %
Post FEV6/FVC ratio: 100 %
Pre FEV1/FVC ratio: 72 %
Pre FEV6/FVC Ratio: 100 %
RV % pred: 120 %
RV: 2.75 L
TLC % pred: 88 %
TLC: 4.14 L

## 2021-05-05 NOTE — Progress Notes (Signed)
PFT done today. 

## 2021-05-05 NOTE — Patient Instructions (Addendum)
-   trelegy 1 puff once daily, rinse mouth out after each use  ?- albuterol inhaler 1-2 puffs as needed  ?- see you in a year or sooner if need be! ?

## 2021-05-11 ENCOUNTER — Other Ambulatory Visit: Payer: Self-pay | Admitting: Student

## 2021-07-09 ENCOUNTER — Other Ambulatory Visit: Payer: Self-pay | Admitting: Student

## 2021-07-14 ENCOUNTER — Ambulatory Visit: Payer: Medicare Other

## 2021-07-14 DIAGNOSIS — E7800 Pure hypercholesterolemia, unspecified: Secondary | ICD-10-CM

## 2021-07-14 DIAGNOSIS — I6523 Occlusion and stenosis of bilateral carotid arteries: Secondary | ICD-10-CM

## 2021-07-14 DIAGNOSIS — I340 Nonrheumatic mitral (valve) insufficiency: Secondary | ICD-10-CM

## 2021-07-14 LAB — LIPID PANEL WITH LDL/HDL RATIO
Cholesterol, Total: 203 mg/dL — ABNORMAL HIGH (ref 100–199)
HDL: 64 mg/dL (ref >39)
LDL Chol Calc (NIH): 113 mg/dL — ABNORMAL HIGH (ref 0–99)
LDL/HDL Ratio: 1.8 ratio (ref 0.0–3.2)
Triglycerides: 151 mg/dL — ABNORMAL HIGH (ref 0–149)
VLDL Cholesterol Cal: 26 mg/dL (ref 5–40)

## 2021-07-21 NOTE — Progress Notes (Signed)
Primary Physician/Referring:  Joya Gaskins, FNP  Patient ID: Kelly [name], female    DOB: March 21, 1940, 81 y.o.   MRN: [id number]  Chief Complaint  Patient presents with   Atrial Fibrillation   Results   Follow-up   HPI:    Kelly [name]  is a 81 y.o. Caucasian female patient with paroxysmal atrial fibrillation on chronic Eliquis therapy, hypertension, hyperlipidemia, chronic back pain and lumbar stenosis with radiculopathy, ongoing tobacco use disorder with COPD (with chronic dyspnea on exertion and wheezing) with reactive airway disease.   Patient has previously undergone echocardiogram which fortunately did not show critical aortic stenosis. She does have LVOT gradient due to severe LVH from hypertension with hypertensive heart disease. Given LVOT gradient and underlying COPD recommended risk factor control including controlling heart rate and blood pressure.   Patient was last seen in the office 01/13/2021, since then she has been admitted with COPD exacerbation and most recently in 02/2021 with AKI due to dehydration. She now presents for 6 month follow up. Patient states she is feeling the best she has in some time. Denies chest pain, palpitations, syncope, near syncope, dizziness, orthopnea, PND, leg swelling.  She does unfortunately continue to smoke 5-6 cigarettes per day. Her dyspnea has significantly improved since February hospitalization. She is currently doing physical therapy and waling regularly without issue. She admits to missing several doses of Repatha because she forgot. She does continue to follow closely with pulmonology.   Past Medical History:  Diagnosis Date   Anemia    Anxiety    Arthritis    B12 deficiency 05/2015   B12 was 184   Cancer (HCC)    CHF (congestive heart failure) (HCC)    COPD (chronic obstructive pulmonary disease) (HCC)    Crohn's disease, small intestine ([place]) 02/19/2009   Depression 10/2013   chronic recurrent major depressive  disorder   Dysrhythmia    H/O vitamin D deficiency 08/04/2008   Heart murmur    Hypertension    Hypothyroidism    Pneumonia    Recurrent dislocation of hip, right    Past Surgical History:  Procedure Laterality Date   CYSTOSCOPY WITH RETROGRADE PYELOGRAM, URETEROSCOPY AND STENT PLACEMENT Left 02/17/2021   Procedure: CYSTOSCOPY WITH  RETROGRADE PYELOGRAM, LEFT URETEROSCOPY , BIOPSY AND tumor ablation  STENT PLACEMENT;  Surgeon: Alexis Frock, MD;  Location: [facility];  Service: Urology;  Laterality: Left;   HOLMIUM LASER APPLICATION Left 07/17/2456   Procedure: HOLMIUM LASER APPLICATION;  Surgeon: Alexis Frock, MD;  Location: [facility];  Service: Urology;  Laterality: Left;   JOINT REPLACEMENT     Right hip, 2015   LAPAROSCOPY  06/03/2016   Duodenal ulcer repair   TONSILLECTOMY     TUBAL LIGATION     Family History  Problem Relation Age of Onset   Heart disease Mother    Alcohol abuse Mother    Colon cancer Sister    Breast cancer Sister    Heart attack Brother    Cancer Neg Hx    Diabetes Neg Hx     Social History   Tobacco Use   Smoking status: Every Day    Packs/day: 1.00    Years: 58.00    Total pack years: 58.00    Types: Cigarettes   Smokeless tobacco: Never   Tobacco comments:    trying to quit, wearing nicotine patch. Cautioned against smoking and wearing patch.1/4 ppd 03/18/21  Substance Use Topics   Alcohol use: Yes    Comment:  OCC   Marital Status: Married  ROS  Review of Systems  Constitutional: Negative for malaise/fatigue and weight gain.  Cardiovascular:  Positive for dyspnea on exertion (stable). Negative for chest pain, claudication, leg swelling, near-syncope, orthopnea, palpitations, paroxysmal nocturnal dyspnea and syncope.  Respiratory:  Positive for cough and wheezing (chronic). Negative for shortness of breath.   Musculoskeletal:  Positive for arthritis (right hip).  Psychiatric/Behavioral:  The patient is nervous/anxious.    Objective  Blood  pressure (!) 147/60, [name] 74, temperature 98 F (36.7 C), temperature source Temporal, resp. rate 17, height 5' 1.5" (1.562 m), weight 95 lb 12.8 oz (43.5 kg), SpO2 98 %. Body mass index is 17.81 kg/m.     07/22/2021   10:04 AM 05/05/2021   10:09 AM 03/18/2021   10:02 AM  Vitals with BMI  Height 5' 1.5" 5' 1.5" _0   Weight 95 lbs 13 oz 91 lbs 87 lbs  BMI 17.81 10.62 69.48  Systolic 546 270 350  Diastolic 60 64 60  [name] 74 71 102     Physical Exam Vitals reviewed.  Constitutional:      Appearance: She is underweight. She is not ill-appearing.  Neck:     Vascular: Carotid bruit (bilateral) present. No JVD.  Cardiovascular:     Rate and Rhythm: Normal rate and regular rhythm.     Pulses:          Femoral pulses are 1+ on the right side and 2+ on the left side.      Popliteal pulses are 0 on the right side and 2+ on the left side.       Dorsalis pedis pulses are 0 on the right side and 0 on the left side.       Posterior tibial pulses are 0 on the right side and 0 on the left side.     Heart sounds: Murmur heard.     Harsh midsystolic murmur is present with a grade of 3/6 at the upper right sternal border and apex radiating to the neck.     No gallop.  Pulmonary:     Effort: Pulmonary effort is normal.     Breath sounds: Wheezing (bilateral diffuse) and rales (bilateral diffuse) present.  Musculoskeletal:        General: No swelling.    Laboratory examination:      Latest Ref Rng & Units 02/28/2021    5:10 AM 02/27/2021    8:19 PM 02/21/2021    3:58 AM  CMP  Glucose 70 - 99 mg/dL 96  109  118   BUN 8 - 23 mg/dL _1 Creatinine 0.44 - 1.00 mg/dL 1.18  1.32  1.06   Sodium 135 - 145 mmol/L 135  136  140   Potassium 3.5 - 5.1 mmol/L 3.6  3.7  3.7   Chloride 98 - 111 mmol/L 99  98  97   CO2 22 - 32 mmol/L 28  27  33   Calcium 8.9 - 10.3 mg/dL 8.3  9.1  8.9   Total Protein 6.5 - 8.1 g/dL 5.7     Total Bilirubin 0.3 - 1.2 mg/dL 0.3     Alkaline Phos 38 - 126 U/L 42      AST 15 - 41 U/L 35     ALT 0 - 44 U/L 15         Latest Ref Rng & Units 02/28/2021    5:10 AM 02/27/2021  8:19 PM 02/21/2021    3:58 AM  CBC  WBC 4.0 - 10.5 K/uL 7.5  8.1  9.7   Hemoglobin 12.0 - 15.0 g/dL 9.8  11.6  10.9   Hematocrit 36.0 - 46.0 % 29.2  34.3  33.5   Platelets 150 - 400 K/uL 184  208  192    Lipid Panel     Component Value Date/Time   CHOL 203 (H) 07/13/2021 1020   TRIG 151 (H) 07/13/2021 1020   HDL 64 07/13/2021 1020   LDLCALC 113 (H) 07/13/2021 1020   HEMOGLOBIN A1C No results found for: "HGBA1C", "MPG" TSH No results for input(s): "TSH" in the last 8760 hours.l External labs:  05/22/2020: Hb 10.8/HCT 32.9, platelets 241, normal indicis.  Iron studies revealed mild iron deficiency anemia. TSH is normal at 3.28. Total cholesterol 227, triglycerides 130, HDL 56, LDL 143.  Non-HDL cholesterol 171. BUN 13, creatinine 0.66, potassium 4.3, EGFR 89 mL, serum glucose 105 mg.  Allergies   Allergies  Allergen Reactions   Bactrim [Sulfamethoxazole-Trimethoprim] Diarrhea and Nausea And Vomiting   Bee Venom Anaphylaxis    Medications Prior to Visit:   Outpatient Medications Prior to Visit  Medication Sig Dispense Refill   albuterol (VENTOLIN HFA) 108 (90 Base) MCG/ACT inhaler Inhale 2 puffs into the lungs every 6 (six) hours as needed for wheezing or shortness of breath. 8 g 6   apixaban (ELIQUIS) 2.5 MG TABS tablet Take 1 tablet (2.5 mg total) by mouth 2 (two) times daily. 180 tablet 1   atorvastatin (LIPITOR) 40 MG tablet Take 1 tablet (40 mg total) by mouth at bedtime. 90 tablet 3   b complex vitamins tablet Take 1 tablet by mouth daily.     digoxin (LANOXIN) 0.125 MG tablet TAKE 1 TABLET(0.125 MG) BY MOUTH DAILY 90 tablet 0   DULoxetine (CYMBALTA) 60 MG capsule Take 60 mg by mouth daily.     EPINEPHrine 0.3 mg/0.3 mL IJ SOAJ injection Inject 0.3 mg into the muscle as needed for anaphylaxis.     ferrous sulfate 325 (65 FE) MG EC tablet Take 1 tablet (325 mg  total) by mouth daily with breakfast. 90 tablet 1   Fluticasone-Umeclidin-Vilant (TRELEGY ELLIPTA) 100-62.5-25 MCG/ACT AEPB Inhale 1 puff into the lungs daily. 1 each 11   folic acid (FOLVITE) 1 MG tablet Take 1 mg by mouth daily.     metoprolol succinate (TOPROL-XL) 25 MG 24 hr tablet Take 25 mg by mouth daily.     nicotine (NICODERM CQ - DOSED IN MG/24 HOURS) 21 mg/24hr patch Place 1 patch (21 mg total) onto the skin daily. 28 patch 0   ondansetron (ZOFRAN) 4 MG tablet Take 1 tablet (4 mg total) by mouth every 8 (eight) hours as needed for nausea or vomiting. 15 tablet 1   pantoprazole (PROTONIX) 40 MG tablet Take 40 mg by mouth daily.     pregabalin (LYRICA) 50 MG capsule Take 50 mg by mouth 2 (two) times daily.     REPATHA 140 MG/ML SOSY INJECT 1 ML UNDER THE SKIN EVERY 14 DAYS AS DIRECTED 2 mL 3   Spacer/Aero-Holding Chambers (VORTEX VALVED HOLDING CHAMBER) DEVI by Does not apply route.     Fluticasone-Umeclidin-Vilant (TRELEGY ELLIPTA) 100-62.5-25 MCG/ACT AEPB Inhale 1 puff into the lungs daily. 14 each 0   varenicline (CHANTIX PAK) 0.5 MG X 11 & 1 MG X 42 tablet Take one 0.5 mg tablet by mouth once daily for 3 days, then increase to one 0.5 mg  tablet twice daily for 4 days, then increase to one 1 mg tablet twice daily. 53 tablet 0   varenicline (CHANTIX) 1 MG tablet TAKE 1 TABLET(1 MG) BY MOUTH TWICE DAILY 180 tablet 0   No facility-administered medications prior to visit.   Final Medications at End of Visit    Current Meds  Medication Sig   albuterol (VENTOLIN HFA) 108 (90 Base) MCG/ACT inhaler Inhale 2 puffs into the lungs every 6 (six) hours as needed for wheezing or shortness of breath.   apixaban (ELIQUIS) 2.5 MG TABS tablet Take 1 tablet (2.5 mg total) by mouth 2 (two) times daily.   atorvastatin (LIPITOR) 40 MG tablet Take 1 tablet (40 mg total) by mouth at bedtime.   b complex vitamins tablet Take 1 tablet by mouth daily.   digoxin (LANOXIN) 0.125 MG tablet TAKE 1 TABLET(0.125  MG) BY MOUTH DAILY   DULoxetine (CYMBALTA) 60 MG capsule Take 60 mg by mouth daily.   EPINEPHrine 0.3 mg/0.3 mL IJ SOAJ injection Inject 0.3 mg into the muscle as needed for anaphylaxis.   ferrous sulfate 325 (65 FE) MG EC tablet Take 1 tablet (325 mg total) by mouth daily with breakfast.   Fluticasone-Umeclidin-Vilant (TRELEGY ELLIPTA) 100-62.5-25 MCG/ACT AEPB Inhale 1 puff into the lungs daily.   folic acid (FOLVITE) 1 MG tablet Take 1 mg by mouth daily.   metoprolol succinate (TOPROL-XL) 25 MG 24 hr tablet Take 25 mg by mouth daily.   nicotine (NICODERM CQ - DOSED IN MG/24 HOURS) 21 mg/24hr patch Place 1 patch (21 mg total) onto the skin daily.   ondansetron (ZOFRAN) 4 MG tablet Take 1 tablet (4 mg total) by mouth every 8 (eight) hours as needed for nausea or vomiting.   pantoprazole (PROTONIX) 40 MG tablet Take 40 mg by mouth daily.   pregabalin (LYRICA) 50 MG capsule Take 50 mg by mouth 2 (two) times daily.   REPATHA 140 MG/ML SOSY INJECT 1 ML UNDER THE SKIN EVERY 14 DAYS AS DIRECTED   Spacer/Aero-Holding Chambers (VORTEX VALVED HOLDING CHAMBER) DEVI by Does not apply route.    Medications after today's encounter: Current Outpatient Medications  Medication Instructions   albuterol (VENTOLIN HFA) 108 (90 Base) MCG/ACT inhaler 2 puffs, Inhalation, Every 6 hours PRN   apixaban (ELIQUIS) 2.5 mg, Oral, 2 times daily   atorvastatin (LIPITOR) 40 mg, Oral, Daily at bedtime   b complex vitamins tablet 1 tablet, Oral, Daily   digoxin (LANOXIN) 0.125 MG tablet TAKE 1 TABLET(0.125 MG) BY MOUTH DAILY   DULoxetine (CYMBALTA) 60 mg, Oral, Daily   EPINEPHrine (EPI-PEN) 0.3 mg, Intramuscular, As needed   ferrous sulfate 325 mg, Oral, Daily with breakfast   Fluticasone-Umeclidin-Vilant (TRELEGY ELLIPTA) 100-62.5-25 MCG/ACT AEPB 1 puff, Inhalation, Daily   Fluticasone-Umeclidin-Vilant (TRELEGY ELLIPTA) 100-62.5-25 MCG/ACT AEPB 1 puff, Inhalation, Daily   folic acid (FOLVITE) 1 mg, Oral, Daily    metoprolol succinate (TOPROL-XL) 25 mg, Oral, Daily   nicotine (NICODERM CQ - DOSED IN MG/24 HOURS) 21 mg, Transdermal, Daily   ondansetron (ZOFRAN) 4 mg, Oral, Every 8 hours PRN   pantoprazole (PROTONIX) 40 mg, Oral, Daily   pregabalin (LYRICA) 50 mg, Oral, 2 times daily   REPATHA 140 MG/ML SOSY INJECT 1 ML UNDER THE SKIN EVERY 14 DAYS AS DIRECTED   Spacer/Aero-Holding Chambers (VORTEX VALVED HOLDING CHAMBER) DEVI Does not apply   varenicline (CHANTIX PAK) 0.5 MG X 11 & 1 MG X 42 tablet Take one 0.5 mg tablet by mouth once daily for 3  days, then increase to one 0.5 mg tablet twice daily for 4 days, then increase to one 1 mg tablet twice daily.   varenicline (CHANTIX) 1 MG tablet TAKE 1 TABLET(1 MG) BY MOUTH TWICE DAILY    Radiology:   Chest x-ray PA and lateral view 06/11/2019: Cardiomegaly with atherosclerotic changes of the aorta. New small left pleural effusion. COPD changes with fine reticular prominence and hyperinflation.  Cardiac Studies:   Echocardiogram 08/22/2016: - Left ventricle: The cavity size was normal. There was severe  concentric hypertrophy. Cordal SAM is noted. Systolic function   was vigorous. The estimated ejection fraction was in the range of  65% to 70%. There was dynamic obstruction during Valsalvain the mid cavity, with mid-cavity obliteration, a peak velocity of 421 cm/sec, and a peak gradient of 71 mm Hg. Doppler parameters are consistent with abnormal left ventricular relaxation (grade 1  diastolic dysfunction). The E/e&' ratio is >15, suggesting  elevated LV filling pressure. - Mitral valve: Mildly thickened leaflets. Cordal SAM is noted.   There was trivial regurgitation. - Left atrium: The atrium was normal in size. - Inferior vena cava: The vessel was normal in size. The   respirophasic diameter changes were in the normal range (>= 50%), consistent with normal central venous pressure.   Impressions: - LVEF 65-70%, severe concentric LVH with cordal SAM,  dynamic   mid-cavitary gradient of 41 mmHg at rest that increases to 71   mmHg with valsalva.  PCV MYOCARDIAL PERFUSION WITH LEXISCAN 06/22/2020 Lexiscan nuclear stress test performed using 1-day protocol. Normal myocardial perfusion. Stress LVEF calculated 44%, but visually appears normal. Low risk study.   Echocardiogram 07/14/2021:  Left ventricle cavity is normal in size. Severe concentric hypertrophy of  the left ventricle. Normal global wall motion. Normal LV systolic function  with EF 57%. Diastolic function not assessed due to severity of mitral  regurgitation.  Left atrial cavity is severely dilated.  Structurally normal trileaflet aortic valve.  Trace aortic regurgitation.  No significant valvular stenosis. Increased LVOT velocity due to LVOT  obstruction. Chordal SAM seen. LVOT gradient at least 89 mmHg.  Structurally normal mitral valve. Moderate (Grade III) mitral  regurgitation.  Mild tricuspid regurgitation. Estimated pulmonary artery systolic pressure  41 mmHg.  Compared to previous study on 06/17/2020. chordal SAM more prominent. Peak  LVOT gradient better appreciated, and appears to be at least 89 mmHg.  Differentials include 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.  Clinical correlation recommended.   Carotid artery duplex 07/14/2021:  Duplex suggests stenosis in the right internal carotid artery (50-69%).  Duplex suggests stenosis in the left internal carotid artery (>=70%). The  left PSV internal/common carotid artery ratio of 5.29 is consistent with a  stenosis of >70%. Duplex suggests stenosis in the left external carotid  artery (<50%).  Antegrade right vertebral artery flow. Antegrade left vertebral artery  flow.  No significant change since prior study 12/30/2020. Follow up in six  months is appropriate if clinically indicated.  EKG:  07/23/2021: Sinus rhythm at a rate of 68 bpm.  Left atrial enlargement.  Normal axis.  LVH with secondary repolarization  abnormality, cannot exclude lateral ischemia.  Compared EKG 09/30/2020, no significant change.  08/13/2020: Normal sinus rhythm at rate of 93 bpm, left atrial enlargement, normal axis.  Anteroseptal infarct old.  LVH with repolarization abnormality, cannot exclude lateral ischemia.  No significant change from 06/04/2020.     Assessment     ICD-10-CM   1. Paroxysmal atrial fibrillation (HCC)  I48.0 EKG  12-Lead    2. Asymptomatic bilateral carotid artery stenosis  I65.23 PCV CAROTID DUPLEX (BILATERAL)    3. Mitral valve insufficiency, unspecified etiology  I34.0 PCV ECHOCARDIOGRAM COMPLETE      CHA2DS2-VASc Score is 5.  Yearly risk of stroke: 7.2% (F, A, HTN, Vasc Dz).  Score of 1=0.6; 2=2.2; 3=3.2; 4=4.8; 5=7.2; 6=9.8; 7=>9.8) -(CHF; HTN; vasc disease DM,  Female = 1; Age <65 =0; 65-74 = 1,  >75 =2; stroke/embolism= 2).    No orders of the defined types were placed in this encounter.   Orders Placed This Encounter  Procedures   EKG 12-Lead   PCV ECHOCARDIOGRAM COMPLETE    Standing Status:   Future    Standing Expiration Date:   07/23/2022   Recommendations:   Deeanne Deininger is a 81 y.o. Caucasian female patient with paroxysmal atrial fibrillation on chronic Eliquis therapy, hypertension, hyperlipidemia, chronic back pain and lumbar stenosis with radiculopathy, ongoing tobacco use disorder with COPD (with chronic dyspnea on exertion and wheezing) with reactive airway disease.   Patient has previously undergone echocardiogram which fortunately did not show critical aortic stenosis. She does have LVOT gradient due to severe LVH from hypertension with hypertensive heart disease. Given LVOT gradient and underlying COPD recommended risk factor control including controlling heart rate and blood pressure.   Patient was last seen in the office 01/13/2021, since then she has been admitted with COPD exacerbation and most recently in 02/2021 with AKI due to dehydration. She now presents for 6 month  follow up.  Reviewed and discussed results of echocardiogram and carotid artery duplex, details above.  Overall echocardiogram is fairly stable and carotid artery stenosis is essentially unchanged.  We will plan to repeat both echocardiogram and carotid artery surveillance in 6 months to continue to monitor closely.  Patient's blood pressure is mildly elevated in the office today, however it is typically well controlled.  Encourage patient to monitor blood pressure regularly at home and notify our office if it remains >130/80 mmHg on average. Continue present medications.   Follow-up in 6 months, sooner if needed.   Alethia Berthold, PA-C 07/23/2021, 2:26 PM Office: [phone]

## 2021-07-22 ENCOUNTER — Encounter: Payer: Self-pay | Admitting: Student

## 2021-07-22 ENCOUNTER — Ambulatory Visit: Payer: Medicare Other | Admitting: Student

## 2021-07-22 VITALS — BP 147/60 | HR 74 | Temp 98.0°F | Resp 17 | Ht 61.5 in | Wt 95.8 lb

## 2021-07-22 DIAGNOSIS — I340 Nonrheumatic mitral (valve) insufficiency: Secondary | ICD-10-CM

## 2021-07-22 DIAGNOSIS — I480 Paroxysmal atrial fibrillation: Secondary | ICD-10-CM

## 2021-07-22 DIAGNOSIS — I6523 Occlusion and stenosis of bilateral carotid arteries: Secondary | ICD-10-CM

## 2021-08-12 ENCOUNTER — Telehealth (HOSPITAL_COMMUNITY): Payer: Self-pay | Admitting: Psychiatry

## 2021-08-12 NOTE — Telephone Encounter (Signed)
D:  [facility] referred pt to [place].  A:  Placed call to orient pt, but there was no answer.  Left vm requesting pt to call the case manager back.

## 2021-08-19 ENCOUNTER — Other Ambulatory Visit: Payer: Self-pay | Admitting: Student

## 2021-08-31 ENCOUNTER — Other Ambulatory Visit: Payer: Medicare Other

## 2021-09-21 ENCOUNTER — Emergency Department (HOSPITAL_COMMUNITY): Payer: Medicare Other

## 2021-09-21 ENCOUNTER — Observation Stay (HOSPITAL_COMMUNITY)
Admission: EM | Admit: 2021-09-21 | Discharge: 2021-09-23 | Disposition: A | Discharge status: Home / Self Care | Payer: Medicare Other | Attending: Internal Medicine | Admitting: Internal Medicine

## 2021-09-21 ENCOUNTER — Other Ambulatory Visit: Payer: Self-pay

## 2021-09-21 ENCOUNTER — Encounter (HOSPITAL_COMMUNITY): Payer: Self-pay | Admitting: Internal Medicine

## 2021-09-21 DIAGNOSIS — K922 Gastrointestinal hemorrhage, unspecified: Principal | ICD-10-CM | POA: Insufficient documentation

## 2021-09-21 DIAGNOSIS — K921 Melena: Secondary | ICD-10-CM

## 2021-09-21 DIAGNOSIS — F0390 Unspecified dementia without behavioral disturbance: Secondary | ICD-10-CM | POA: Insufficient documentation

## 2021-09-21 DIAGNOSIS — D649 Anemia, unspecified: Secondary | ICD-10-CM | POA: Diagnosis not present

## 2021-09-21 DIAGNOSIS — R1013 Epigastric pain: Principal | ICD-10-CM

## 2021-09-21 DIAGNOSIS — F17210 Nicotine dependence, cigarettes, uncomplicated: Secondary | ICD-10-CM | POA: Insufficient documentation

## 2021-09-21 DIAGNOSIS — Z79899 Other long term (current) drug therapy: Secondary | ICD-10-CM | POA: Insufficient documentation

## 2021-09-21 DIAGNOSIS — Z7901 Long term (current) use of anticoagulants: Secondary | ICD-10-CM | POA: Insufficient documentation

## 2021-09-21 DIAGNOSIS — D62 Acute posthemorrhagic anemia: Secondary | ICD-10-CM | POA: Insufficient documentation

## 2021-09-21 DIAGNOSIS — I509 Heart failure, unspecified: Secondary | ICD-10-CM | POA: Insufficient documentation

## 2021-09-21 DIAGNOSIS — I110 Hypertensive heart disease with heart failure: Secondary | ICD-10-CM | POA: Insufficient documentation

## 2021-09-21 DIAGNOSIS — E039 Hypothyroidism, unspecified: Secondary | ICD-10-CM | POA: Diagnosis not present

## 2021-09-21 DIAGNOSIS — K449 Diaphragmatic hernia without obstruction or gangrene: Secondary | ICD-10-CM | POA: Diagnosis not present

## 2021-09-21 DIAGNOSIS — Z96641 Presence of right artificial hip joint: Secondary | ICD-10-CM | POA: Insufficient documentation

## 2021-09-21 DIAGNOSIS — I480 Paroxysmal atrial fibrillation: Secondary | ICD-10-CM | POA: Diagnosis not present

## 2021-09-21 DIAGNOSIS — K264 Chronic or unspecified duodenal ulcer with hemorrhage: Secondary | ICD-10-CM

## 2021-09-21 DIAGNOSIS — J449 Chronic obstructive pulmonary disease, unspecified: Secondary | ICD-10-CM | POA: Diagnosis not present

## 2021-09-21 LAB — PROTIME-INR
INR: 1.5 — ABNORMAL HIGH (ref 0.8–1.2)
Prothrombin Time: 17.8 seconds — ABNORMAL HIGH (ref 11.4–15.2)

## 2021-09-21 LAB — CBC WITH DIFFERENTIAL/PLATELET
Abs Immature Granulocytes: 0.02 10*3/uL (ref 0.00–0.07)
Basophils Absolute: 0 10*3/uL (ref 0.0–0.1)
Basophils Relative: 1 %
Eosinophils Absolute: 0 10*3/uL (ref 0.0–0.5)
Eosinophils Relative: 0 %
HCT: 25.6 % — ABNORMAL LOW (ref 36.0–46.0)
Hemoglobin: 8.5 g/dL — ABNORMAL LOW (ref 12.0–15.0)
Immature Granulocytes: 0 %
Lymphocytes Relative: 22 %
Lymphs Abs: 1.1 10*3/uL (ref 0.7–4.0)
MCH: 32.3 pg (ref 26.0–34.0)
MCHC: 33.2 g/dL (ref 30.0–36.0)
MCV: 97.3 fL (ref 80.0–100.0)
Monocytes Absolute: 0.4 10*3/uL (ref 0.1–1.0)
Monocytes Relative: 8 %
Neutro Abs: 3.3 10*3/uL (ref 1.7–7.7)
Neutrophils Relative %: 69 %
Platelets: 199 10*3/uL (ref 150–400)
RBC: 2.63 MIL/uL — ABNORMAL LOW (ref 3.87–5.11)
RDW: 14.8 % (ref 11.5–15.5)
WBC: 4.8 10*3/uL (ref 4.0–10.5)
nRBC: 0 % (ref 0.0–0.2)

## 2021-09-21 LAB — URINALYSIS, ROUTINE W REFLEX MICROSCOPIC
Bilirubin Urine: NEGATIVE
Glucose, UA: NEGATIVE mg/dL
Ketones, ur: 5 mg/dL — AB
Nitrite: NEGATIVE
Protein, ur: NEGATIVE mg/dL
Specific Gravity, Urine: >1.046 — ABNORMAL HIGH (ref 1.005–1.030)
pH: 5 (ref 5.0–8.0)

## 2021-09-21 LAB — COMPREHENSIVE METABOLIC PANEL
ALT: 13 U/L (ref 0–44)
AST: 32 U/L (ref 15–41)
Albumin: 3.5 g/dL (ref 3.5–5.0)
Alkaline Phosphatase: 51 U/L (ref 38–126)
Anion gap: 13 (ref 5–15)
BUN: 19 mg/dL (ref 8–23)
CO2: 20 mmol/L — ABNORMAL LOW (ref 22–32)
Calcium: 9.5 mg/dL (ref 8.9–10.3)
Chloride: 109 mmol/L (ref 98–111)
Creatinine, Ser: 1.09 mg/dL — ABNORMAL HIGH (ref 0.44–1.00)
GFR, Estimated: 51 mL/min — ABNORMAL LOW (ref >60)
Glucose, Bld: 122 mg/dL — ABNORMAL HIGH (ref 70–99)
Potassium: 4.1 mmol/L (ref 3.5–5.1)
Sodium: 142 mmol/L (ref 135–145)
Total Bilirubin: 0.8 mg/dL (ref 0.3–1.2)
Total Protein: 6.4 g/dL — ABNORMAL LOW (ref 6.5–8.1)

## 2021-09-21 LAB — IRON AND TIBC
Iron: 44 ug/dL (ref 28–170)
Saturation Ratios: 14 % (ref 10.4–31.8)
TIBC: 318 ug/dL (ref 250–450)
UIBC: 274 ug/dL

## 2021-09-21 LAB — CBC
HCT: 23.6 % — ABNORMAL LOW (ref 36.0–46.0)
Hemoglobin: 7.4 g/dL — ABNORMAL LOW (ref 12.0–15.0)
MCH: 32.3 pg (ref 26.0–34.0)
MCHC: 31.4 g/dL (ref 30.0–36.0)
MCV: 103.1 fL — ABNORMAL HIGH (ref 80.0–100.0)
Platelets: 209 10*3/uL (ref 150–400)
RBC: 2.29 MIL/uL — ABNORMAL LOW (ref 3.87–5.11)
RDW: 15.2 % (ref 11.5–15.5)
WBC: 5.1 10*3/uL (ref 4.0–10.5)
nRBC: 0 % (ref 0.0–0.2)

## 2021-09-21 LAB — LIPASE, BLOOD: Lipase: 33 U/L (ref 11–51)

## 2021-09-21 LAB — TROPONIN I (HIGH SENSITIVITY)
Troponin I (High Sensitivity): 36 ng/L — ABNORMAL HIGH (ref <18)
Troponin I (High Sensitivity): 37 ng/L — ABNORMAL HIGH (ref <18)

## 2021-09-21 LAB — POC OCCULT BLOOD, ED: Fecal Occult Bld: POSITIVE — AB

## 2021-09-21 LAB — APTT: aPTT: 45 seconds — ABNORMAL HIGH (ref 24–36)

## 2021-09-21 LAB — HEMOGLOBIN AND HEMATOCRIT, BLOOD
HCT: 25.6 % — ABNORMAL LOW (ref 36.0–46.0)
Hemoglobin: 8.2 g/dL — ABNORMAL LOW (ref 12.0–15.0)

## 2021-09-21 LAB — FERRITIN: Ferritin: 69 ng/mL (ref 11–307)

## 2021-09-21 MED ORDER — NICOTINE 14 MG/24HR TD PT24
14.0000 mg | MEDICATED_PATCH | Freq: Every day | TRANSDERMAL | Status: DC
  Administered 2021-09-21 – 2021-09-23 (×3): 14 mg via TRANSDERMAL
  Filled 2021-09-21 (×3): qty 1

## 2021-09-21 MED ORDER — LACTATED RINGERS IV BOLUS
500.0000 mL | Freq: Once | INTRAVENOUS | Status: AC
  Administered 2021-09-21: 500 mL via INTRAVENOUS

## 2021-09-21 MED ORDER — PANTOPRAZOLE SODIUM 40 MG IV SOLR
40.0000 mg | Freq: Once | INTRAVENOUS | Status: AC
  Administered 2021-09-21: 40 mg via INTRAVENOUS
  Filled 2021-09-21: qty 10

## 2021-09-21 MED ORDER — FOLIC ACID 1 MG PO TABS
1.0000 mg | ORAL_TABLET | Freq: Every day | ORAL | Status: DC
  Administered 2021-09-21 – 2021-09-23 (×3): 1 mg via ORAL
  Filled 2021-09-21 (×3): qty 1

## 2021-09-21 MED ORDER — TRAZODONE HCL 50 MG PO TABS
50.0000 mg | ORAL_TABLET | Freq: Every evening | ORAL | Status: DC | PRN
  Administered 2021-09-21 – 2021-09-22 (×2): 50 mg via ORAL
  Filled 2021-09-21 (×2): qty 1

## 2021-09-21 MED ORDER — FENTANYL CITRATE PF 50 MCG/ML IJ SOSY
25.0000 ug | PREFILLED_SYRINGE | Freq: Once | INTRAMUSCULAR | Status: AC
  Administered 2021-09-21: 25 ug via INTRAVENOUS
  Filled 2021-09-21 (×2): qty 1

## 2021-09-21 MED ORDER — PREGABALIN 25 MG PO CAPS: 50.0000 mg | ORAL_CAPSULE | Freq: Two times a day (BID) | ORAL | Status: DC

## 2021-09-21 MED ORDER — PANTOPRAZOLE SODIUM 40 MG IV SOLR
40.0000 mg | Freq: Two times a day (BID) | INTRAVENOUS | Status: DC
Start: 2021-09-21 — End: 2021-09-23
  Administered 2021-09-21 – 2021-09-22 (×3): 40 mg via INTRAVENOUS
  Filled 2021-09-21 (×3): qty 10

## 2021-09-21 MED ORDER — ALBUTEROL SULFATE (2.5 MG/3ML) 0.083% IN NEBU: 2.5000 mg | INHALATION_SOLUTION | RESPIRATORY_TRACT | Status: DC | PRN

## 2021-09-21 MED ORDER — UMECLIDINIUM BROMIDE 62.5 MCG/ACT IN AEPB
1.0000 | INHALATION_SPRAY | Freq: Every day | RESPIRATORY_TRACT | Status: DC
  Administered 2021-09-22 – 2021-09-23 (×2): 1 via RESPIRATORY_TRACT
  Filled 2021-09-21: qty 7

## 2021-09-21 MED ORDER — ONDANSETRON HCL 4 MG/2ML IJ SOLN
4.0000 mg | Freq: Four times a day (QID) | INTRAMUSCULAR | Status: DC | PRN
Start: 2021-09-21 — End: 2021-09-23

## 2021-09-21 MED ORDER — THIAMINE MONONITRATE 100 MG PO TABS
100.0000 mg | ORAL_TABLET | Freq: Every day | ORAL | Status: DC
  Administered 2021-09-21 – 2021-09-23 (×3): 100 mg via ORAL
  Filled 2021-09-21 (×3): qty 1

## 2021-09-21 MED ORDER — FLUTICASONE FUROATE-VILANTEROL 100-25 MCG/ACT IN AEPB
1.0000 | INHALATION_SPRAY | Freq: Every day | RESPIRATORY_TRACT | Status: DC
  Administered 2021-09-22 – 2021-09-23 (×2): 1 via RESPIRATORY_TRACT
  Filled 2021-09-21: qty 28

## 2021-09-21 MED ORDER — PREGABALIN 75 MG PO CAPS
75.0000 mg | ORAL_CAPSULE | Freq: Two times a day (BID) | ORAL | Status: DC
  Administered 2021-09-21 – 2021-09-23 (×4): 75 mg via ORAL
  Filled 2021-09-21 (×4): qty 1

## 2021-09-21 MED ORDER — PREGABALIN 25 MG PO CAPS: 75.0000 mg | ORAL_CAPSULE | Freq: Two times a day (BID) | ORAL | Status: DC

## 2021-09-21 MED ORDER — ATORVASTATIN CALCIUM 40 MG PO TABS: 40.0000 mg | ORAL_TABLET | Freq: Every day | ORAL | Status: DC

## 2021-09-21 MED ORDER — ROSUVASTATIN CALCIUM 20 MG PO TABS
20.0000 mg | ORAL_TABLET | Freq: Every day | ORAL | Status: DC
  Administered 2021-09-21 – 2021-09-22 (×2): 20 mg via ORAL
  Filled 2021-09-21 (×2): qty 1

## 2021-09-21 MED ORDER — DULOXETINE HCL 60 MG PO CPEP
60.0000 mg | ORAL_CAPSULE | Freq: Every day | ORAL | Status: DC
  Administered 2021-09-21 – 2021-09-23 (×3): 60 mg via ORAL
  Filled 2021-09-21: qty 1
  Filled 2021-09-21: qty 2
  Filled 2021-09-21: qty 1

## 2021-09-21 MED ORDER — PANTOPRAZOLE SODIUM 40 MG IV SOLR: 40.0000 mg | INTRAVENOUS | Status: DC

## 2021-09-21 MED ORDER — LATANOPROST 0.005 % OP SOLN
1.0000 [drp] | Freq: Every day | OPHTHALMIC | Status: DC
Start: 2021-09-21 — End: 2021-09-23
  Administered 2021-09-21 – 2021-09-22 (×2): 1 [drp] via OPHTHALMIC
  Filled 2021-09-21: qty 2.5

## 2021-09-21 MED ORDER — IOHEXOL 300 MG/ML  SOLN
80.0000 mL | Freq: Once | INTRAMUSCULAR | Status: AC | PRN
  Administered 2021-09-21: 80 mL via INTRAVENOUS

## 2021-09-21 MED ORDER — THIAMINE HCL 100 MG/ML IJ SOLN
100.0000 mg | Freq: Every day | INTRAMUSCULAR | Status: DC
  Filled 2021-09-21 (×2): qty 1

## 2021-09-21 MED ORDER — DIGOXIN 125 MCG PO TABS
0.1250 mg | ORAL_TABLET | Freq: Every day | ORAL | Status: DC
  Administered 2021-09-22 – 2021-09-23 (×2): 0.125 mg via ORAL
  Filled 2021-09-21 (×2): qty 1

## 2021-09-21 MED ORDER — ADULT MULTIVITAMIN W/MINERALS CH
1.0000 | ORAL_TABLET | Freq: Every day | ORAL | Status: DC
  Administered 2021-09-21 – 2021-09-23 (×3): 1 via ORAL
  Filled 2021-09-21 (×3): qty 1

## 2021-09-21 MED ORDER — METOPROLOL SUCCINATE ER 25 MG PO TB24
25.0000 mg | ORAL_TABLET | Freq: Every day | ORAL | Status: DC
  Administered 2021-09-23: 25 mg via ORAL
  Filled 2021-09-21 (×2): qty 1

## 2021-09-21 MED ORDER — DORZOLAMIDE HCL 2 % OP SOLN
1.0000 [drp] | Freq: Three times a day (TID) | OPHTHALMIC | Status: DC
Start: 2021-09-21 — End: 2021-09-23
  Administered 2021-09-21 – 2021-09-23 (×6): 1 [drp] via OPHTHALMIC
  Filled 2021-09-21: qty 10

## 2021-09-21 NOTE — ED Notes (Signed)
GI at bedside

## 2021-09-21 NOTE — ED Notes (Signed)
[facility] (son in law). Lives with patient.   This RN called to see if he was coming to ED. (Pt was under impression he was coming). Joe reports pt has had stomach pain all day- worse an hour or so before coming. Reports he'd be here "around 0600."

## 2021-09-21 NOTE — Consult Note (Addendum)
[facility] Consult: 8:08 AM 09/21/2021  LOS: 0 days    Referring Provider: ED Dr Doren Custard  Primary Care Physician:  Joya Gaskins, FNP Primary Gastroenterologist:  Dr. Owens Loffler.  Seen once for office visit, he did not perform any procedures.    Reason for Consultation:  epig pain, melena, anemia.     HPI: [name] is a 81 y.o. female.  PMH COPD.  Hypothyroidism.  Hypertension.  Major depression.  Dementia.  Vascular disease.  Bilateral carotid stenosis.  B12 deficiency.  CHF- diastolic, cardiomegaly.  Anemia.  Crohn's disease of small intestine.  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  Severe prot cal malnutrition per [facility] admission note 08/02/2021.  Surgeries include laparoscopy, repair duodenal ulcer (ulcer attributed to NSAIDs).  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  Cystoscopy, ureteroscopy and stent placement 02/2021 tubal ligation.  Tonsillectomy. Med list includes Eliquis, iron sulfate, Protonix.   Hospitalized for 10 days starting 08/02/2021 for acute left vision loss..  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  Underwent left carotid endarterectomy 7/19.  07/2012 colonoscopy.  Performed by MD in [place].  4 mm polyp removed from hepatic flexure.  Erosions in sigmoid and rectum consistent with colitis.  Descending, sigmoid diverticulosis. 05/2016 UGI series.  In [place].  For follow-up repair perforated duodenal ulcer, postop day 4.  No evidence for leak, intact proximal small bowel.  Complains of pain in the epigastrium/right upper quadrant that radiates and also arises from pain in the left scapular area.  This has been there for "years" but its worse in the last few days.  No associated nausea.  Also says she spent yesterday in bed she was just  sleepy and did not feel well though nothing specific seem to be the matter other than the upper abdominal pain.  She is not eating as much since her dentures "are in the shop" and she is consuming last.  She acknowledges weight loss of unclear quantity. 2V abdomen with chest.  No acute cardiopulmonary process.  Constipation, no bowel obstruction.  Osteopenia of spine.  Manges of right hip arthroplasty CTAP w contrast.  FOBT positive. Hgb 8.5- 8.2, was 11.6-9.8 in February 2023.  MCV normal at 97.  Platelets, WBCs normal BUN normal 19.  Creatinine elevated 1.0.  GFR reduced at 51.  LFTs, lipase normal. Troponins I: 36, 37.  Consumes a bottle of wine over the course of 3 days.  Quit drinking vodka several weeks ago.  Until recently smoking 1 pack cigarettes daily but now on patch and has not smoked for at least a month. She lives with her daughter, son-in-law and disabled grand son in [place].  Her husband still lives in [place].  Past Medical History:  Diagnosis Date   Anemia    Anxiety    Arthritis    B12 deficiency 05/2015   B12 was 184   Cancer (HCC)    CHF (congestive heart failure) (HCC)    COPD (chronic obstructive pulmonary disease) (HCC)    Crohn's disease, small intestine ([place]) 02/19/2009   Depression  10/2013   chronic recurrent major depressive disorder   Dysrhythmia    H/O vitamin D deficiency 08/04/2008   Heart murmur    Hypertension    Hypothyroidism    Pneumonia    Recurrent dislocation of hip, right     Past Surgical History:  Procedure Laterality Date   CYSTOSCOPY WITH RETROGRADE PYELOGRAM, URETEROSCOPY AND STENT PLACEMENT Left 02/17/2021   Procedure: CYSTOSCOPY WITH  RETROGRADE PYELOGRAM, LEFT URETEROSCOPY , BIOPSY AND tumor ablation  STENT PLACEMENT;  Surgeon: Alexis Frock, MD;  Location: [facility];  Service: Urology;  Laterality: Left;   HOLMIUM LASER APPLICATION Left 03/19/2949   Procedure: HOLMIUM LASER APPLICATION;  Surgeon: Alexis Frock, MD;  Location: [facility]  [facility];  Service: Urology;  Laterality: Left;   JOINT REPLACEMENT     Right hip, 2015   LAPAROSCOPY  06/03/2016   Duodenal ulcer repair   TONSILLECTOMY     TUBAL LIGATION      Prior to Admission medications   Medication Sig Start Date End Date Taking? Authorizing Provider  albuterol (VENTOLIN HFA) 108 (90 Base) MCG/ACT inhaler Inhale 2 puffs into the lungs every 6 (six) hours as needed for wheezing or shortness of breath. 03/18/21   Maryjane Hurter, MD  atorvastatin (LIPITOR) 40 MG tablet Take 1 tablet (40 mg total) by mouth at bedtime. 09/30/20   Cantwell, Celeste C, PA-C  b complex vitamins tablet Take 1 tablet by mouth daily.    [provider]  digoxin (LANOXIN) 0.125 MG tablet TAKE 1 TABLET(0.125 MG) BY MOUTH DAILY 07/09/21   Cantwell, Celeste C, PA-C  DULoxetine (CYMBALTA) 60 MG capsule Take 60 mg by mouth daily. 08/19/20   [provider]  ELIQUIS 2.5 MG TABS tablet TAKE 1 TABLET(2.5 MG) BY MOUTH TWICE DAILY 08/19/21   Adrian Prows, MD  EPINEPHrine 0.3 mg/0.3 mL IJ SOAJ injection Inject 0.3 mg into the muscle as needed for anaphylaxis.    [provider]  ferrous sulfate 325 (65 FE) MG EC tablet Take 1 tablet (325 mg total) by mouth daily with breakfast. 08/02/16   Martinique, Betty G, MD  Fluticasone-Umeclidin-Vilant (TRELEGY ELLIPTA) 100-62.5-25 MCG/ACT AEPB Inhale 1 puff into the lungs daily. 03/18/21   Maryjane Hurter, MD  Fluticasone-Umeclidin-Vilant (TRELEGY ELLIPTA) 100-62.5-25 MCG/ACT AEPB Inhale 1 puff into the lungs daily. 03/18/21   Maryjane Hurter, MD  folic acid (FOLVITE) 1 MG tablet Take 1 mg by mouth daily.    [provider]  metoprolol succinate (TOPROL-XL) 25 MG 24 hr tablet Take 25 mg by mouth daily.    [provider]  nicotine (NICODERM CQ - DOSED IN MG/24 HOURS) 21 mg/24hr patch Place 1 patch (21 mg total) onto the skin daily. 06/04/20   Adrian Prows, MD  ondansetron (ZOFRAN) 4 MG tablet Take 1 tablet (4 mg total) by mouth every 8  (eight) hours as needed for nausea or vomiting. 02/28/21 02/28/22  Pokhrel, Corrie Mckusick, MD  pantoprazole (PROTONIX) 40 MG tablet Take 40 mg by mouth daily.    [provider]  pregabalin (LYRICA) 50 MG capsule Take 50 mg by mouth 2 (two) times daily.    [provider]  REPATHA 140 MG/ML SOSY INJECT 1 ML UNDER THE SKIN EVERY 14 DAYS AS DIRECTED 05/11/21   Cantwell, Gerline Legacy, PA-C  Spacer/Aero-Holding Chambers (VORTEX VALVED HOLDING CHAMBER) DEVI by Does not apply route.    [provider]  varenicline (CHANTIX PAK) 0.5 MG X 11 & 1 MG X 42 tablet Take one 0.5  mg tablet by mouth once daily for 3 days, then increase to one 0.5 mg tablet twice daily for 4 days, then increase to one 1 mg tablet twice daily. 03/18/21   Maryjane Hurter, MD  varenicline (CHANTIX) 1 MG tablet TAKE 1 TABLET(1 MG) BY MOUTH TWICE DAILY 03/18/21   Maryjane Hurter, MD    Scheduled Meds:  fentaNYL (SUBLIMAZE) injection  25 mcg Intravenous Once   pantoprazole (PROTONIX) IV  40 mg Intravenous Once   Infusions:  lactated ringers     PRN Meds:    Allergies as of 09/21/2021 - Review Complete 07/22/2021  Allergen Reaction Noted   Bactrim [sulfamethoxazole-trimethoprim] Diarrhea and Nausea And Vomiting 07/13/2016   Bee venom Anaphylaxis 07/13/2016    Family History  Problem Relation Age of Onset   Heart disease Mother    Alcohol abuse Mother    Colon cancer Sister    Breast cancer Sister    Heart attack Brother    Cancer Neg Hx    Diabetes Neg Hx     Social History   Socioeconomic History   Marital status: Married    Spouse name: Not on file   Number of children: Not on file   Years of education: Not on file   Highest education level: Not on file  Occupational History   Not on file  Tobacco Use   Smoking status: Every Day    Packs/day: 1.00    Years: 58.00    Total pack years: 58.00    Types: Cigarettes   Smokeless tobacco: Never   Tobacco comments:    trying to quit, wearing  nicotine patch. Cautioned against smoking and wearing patch.1/4 ppd 03/18/21  Vaping Use   Vaping Use: Never used  Substance and Sexual Activity   Alcohol use: Yes    Comment: OCC   Drug use: No   Sexual activity: Not Currently  Other Topics Concern   Not on file  Social History Narrative   Not on file   Social Determinants of Health   Financial Resource Strain: Not on file  Food Insecurity: Not on file  Transportation Needs: Not on file  Physical Activity: Not on file  Stress: Not on file  Social Connections: Not on file  Intimate Partner Violence: Not on file    REVIEW OF SYSTEMS: Constitutional: Sleepiness, weakness. ENT: Periodic low to moderate volume nosebleeds. Pulm: No new cough or shortness of breath. CV:  No palpitations, no LE edema.  Aware of her heart murmur.  No angina. GU:  No hematuria, no frequency GI: See HPI. Heme: No excessive bruising.  Nosebleeds but no other unusual bleeding. Transfusions: No recall of previous blood transfusions. Neuro:  No headaches, no peripheral tingling or numbness.  No dizziness, no syncope, no seizures. Derm: Had lesions biopsied/burned at the dermatologist last week.  Has sores at the sites where this was performed. Endocrine:  No sweats or chills.  No polyuria or dysuria Immunization: Reviewed.  Not queried. Travel: Not queried.   PHYSICAL EXAM: Vital signs in last 24 hours: Vitals:   09/21/21 0711 09/21/21 0806  BP: 99/81   Pulse: 66   Resp: 16   Temp:  97.9 F (36.6 C)  SpO2: 98%    Wt Readings from Last 3 Encounters:  09/21/21 40 kg  07/22/21 43.5 kg  05/05/21 41.3 kg    General: Patient is frail, thin but not acutely or chronically ill-appearing.  She is comfortable and able to provide a decent history. Head:  No facial asymmetry or swelling.  No signs of head trauma.  There are scars all about the nose from her recent dermatologist visit. Eyes: Conjunctiva slightly pale.  No scleral icterus.  EOMI.  No  cloudy pupils Ears: Not hard of hearing Nose: No congestion or discharge Mouth: Poor dentition.  The bulk of her teeth are absent and remaining teeth are snugly and in poor repair.  Mucosa is moist, pink, clear.  Tongue is midline.  No oral lesions. Neck: No JVD, no masses, no thyromegaly. Lungs: No labored breathing or cough.  Lungs clear bilaterally. Heart: Systolic murmur.  Regular rate and rhythm.  S1, S2 present. Abdomen: Thin, soft.  No distention.  Bowel sounds active.  Tenderness in the epigastrium, right upper quadrant without guarding or rebound.  No masses, HSM, bruits, hernias.  Well-healed surgical upper midline scar..   Rectal: Black, soft stool.  No palpable or visible masses.  Stool previously submitted to lab was FOBT positive.  My sample was not tested for FOB Musc/Skeltl: Osteopenic overall appearance. Extremities: No CCE Neurologic: Oriented to hospital, 2023, self, [place].  Answers questions appropriately.  Moves all 4 limbs, no tremor or gross weakness though formal strength testing not performed. Skin: Lesions on her face from recent dermatology interventions. Nodes: No cervical adenopathy Psych: Pleasant, cooperative, animated.  Intake/Output from previous day: No intake/output data recorded. Intake/Output this shift: No intake/output data recorded.  LAB RESULTS: Recent Labs    09/21/21 0220 09/21/21 0714  WBC 4.8  --   HGB 8.5* 8.2*  HCT 25.6* 25.6*  PLT 199  --    BMET Lab Results  Component Value Date   NA 142 09/21/2021   NA 135 02/28/2021   NA 136 02/27/2021   K 4.1 09/21/2021   K 3.6 02/28/2021   K 3.7 02/27/2021   CL 109 09/21/2021   CL 99 02/28/2021   CL 98 02/27/2021   CO2 20 (L) 09/21/2021   CO2 28 02/28/2021   CO2 27 02/27/2021   GLUCOSE 122 (H) 09/21/2021   GLUCOSE 96 02/28/2021   GLUCOSE 109 (H) 02/27/2021   BUN 19 09/21/2021   BUN 26 (H) 02/28/2021   BUN 30 (H) 02/27/2021   CREATININE 1.09 (H) 09/21/2021   CREATININE 1.18  (H) 02/28/2021   CREATININE 1.32 (H) 02/27/2021   CALCIUM 9.5 09/21/2021   CALCIUM 8.3 (L) 02/28/2021   CALCIUM 9.1 02/27/2021   LFT Recent Labs    09/21/21 0220  PROT 6.4*  ALBUMIN 3.5  AST 32  ALT 13  ALKPHOS 51  BILITOT 0.8   PT/INR No results found for: "INR", "PROTIME" Hepatitis Panel No results for input(s): "HEPBSAG", "HCVAB", "HEPAIGM", "HEPBIGM" in the last 72 hours. C-Diff No components found for: "CDIFF" Lipase     Component Value Date/Time   LIPASE 33 09/21/2021 0220    Drugs of Abuse  No results found for: "LABOPIA", "COCAINSCRNUR", "LABBENZ", "AMPHETMU", "THCU", "LABBARB"   RADIOLOGY STUDIES: DG Abdomen Acute W/Chest  Result Date: 09/21/2021 CLINICAL DATA:  Chest and abdominal pain. EXAM: DG ABDOMEN ACUTE WITH 1 VIEW CHEST COMPARISON:  Chest radiograph dated 03/01/2011. FINDINGS: No focal consolidation, pleural effusion or pneumothorax. Mild cardiomegaly. Atherosclerotic calcification of the aorta. Large amount of stool throughout the colon. No bowel dilatation or evidence of obstruction. No free air or radiopaque calculi. Osteopenia with degenerative changes of the spine. No acute osseous pathology. Total right hip arthroplasty. IMPRESSION: 1. No acute cardiopulmonary process. 2. Constipation. No bowel obstruction. Electronically Signed  By: Anner Crete M.D.   On: 09/21/2021 02:59      IMPRESSION:     FOBT +, melnic stool w drop Hgb.  Hx perf DU (NSAID attributed) repair in 2018.  Meds include Protonix 40 mg daily.      Chronic Eliquis.  Last dose 9/4 in PM.    PLAN:       EGD.  TIming TBD.  9 6 versus 9/7 so far has received Protonix 40 mg IV once.  Protonix 40 mg IV bid.  Clear liquids.  Cbc bid/q 12 h.     Kelly [name]  09/21/2021, 8:08 AM Phone [phone]   Attending physician's note  I have taken a history, reviewed the chart and examined the patient. I performed a substantive portion of this encounter, including complete  performance of at least one of the key components, in conjunction with the APP. I agree with the APP's note, impression and recommendations.    81 year old female with history of CHF, Crohn's disease, glaucoma, hypothyroidism, dementia, urothelial carcinoma, perforated duodenal ulcer presented to ER with worsening epigastric and left upper quadrant abdominal pain associated with melena and worsening anemia Plan to proceed with EGD for further evaluation, tentatively plan for tomorrow Clear liquids N.p.o. after midnight Monitor hemoglobin and transfuse if below 7 Continue PPI IV twice daily Hold Eliquis  The risks and benefits as well as alternatives of endoscopic procedure(s) have been discussed and reviewed. All questions answered. The patient agrees to proceed.   The patient was provided an opportunity to ask questions and all were answered. The patient agreed with the plan and demonstrated an understanding of the instructions.  Damaris Hippo , MD [phone]

## 2021-09-21 NOTE — ED Notes (Signed)
ED TO INPATIENT HANDOFF REPORT  ED Nurse Name and Phone #: Stanton Kidney, RN  S Name/Age/Gender [name] 81 y.o. female Room/Bed: 046C/046C  Code Status   Code Status: DNR  Home/SNF/Other Home Patient oriented to: self, place, time, and situation Is this baseline? Yes   Triage Complete: Triage complete  Chief Complaint GI bleed [K92.2]  Triage Note BIB [facility] from home. Epigastric pain. 'Very low time.' Could not describe what was worse today. Son in law may be of help- on the way. Hx dementia.  129/54 80HR NSR - PACs 95% CBG148   Allergies Allergies  Allergen Reactions   Bactrim [Sulfamethoxazole-Trimethoprim] Diarrhea and Nausea And Vomiting   Bee Venom Anaphylaxis    Level of Care/Admitting Diagnosis ED Disposition     ED Disposition  Admit   Condition  --   Comment  Hospital Area: [place] [[id number]]  Level of Care: Med-Surg [16]  May admit patient to [facility] or [facility] if equivalent level of care is available:: Yes  Covid Evaluation: Asymptomatic - no recent exposure (last 10 days) testing not required  Diagnosis: GI bleed [035465]  Admitting Physician: Lottie Mussel [[id number]]  Attending Physician: Lottie Mussel [[id number]]  Certification:: I certify this patient will need inpatient services for at least 2 midnights  Estimated Length of Stay: 3          B Medical/Surgery History Past Medical History:  Diagnosis Date   Anemia    Anxiety    Arthritis    B12 deficiency 05/2015   B12 was 184   Cancer ([place])    CHF (congestive heart failure) ([place])    COPD (chronic obstructive pulmonary disease) ([place])    Crohn's disease, small intestine ([place]) 02/19/2009   Depression 10/2013   chronic recurrent major depressive disorder   Dysrhythmia    H/O vitamin D deficiency 08/04/2008   Heart murmur    Hypertension    Hypothyroidism    Pneumonia    Recurrent dislocation of hip, right    Past Surgical History:  Procedure Laterality  Date   CYSTOSCOPY WITH RETROGRADE PYELOGRAM, URETEROSCOPY AND STENT PLACEMENT Left 02/17/2021   Procedure: CYSTOSCOPY WITH  RETROGRADE PYELOGRAM, LEFT URETEROSCOPY , BIOPSY AND tumor ablation  STENT PLACEMENT;  Surgeon: Alexis Frock, MD;  Location: [facility];  Service: Urology;  Laterality: Left;   HOLMIUM LASER APPLICATION Left 04/20/9673   Procedure: HOLMIUM LASER APPLICATION;  Surgeon: Alexis Frock, MD;  Location: [facility];  Service: Urology;  Laterality: Left;   JOINT REPLACEMENT     Right hip, 2015   LAPAROSCOPY  06/03/2016   Duodenal ulcer repair   TONSILLECTOMY     TUBAL LIGATION       A IV Location/Drains/Wounds Patient Lines/Drains/Airways Status     Active Line/Drains/Airways     Name Placement date Placement time Site Days   Peripheral IV 09/21/21 20 G Right Hand 09/21/21  0812  Hand  less than 1   Ureteral Drain/Stent Left ureter 5 Fr. 02/17/21  1636  Left ureter  216            Intake/Output Last 24 hours No intake or output data in the 24 hours ending 09/21/21 2044  Labs/Imaging Results for orders placed or performed during the hospital encounter of 09/21/21 (from the past 48 hour(s))  CBC with Differential     Status: Abnormal   Collection Time: 09/21/21  2:20 AM  Result Value Ref Range   WBC 4.8 4.0 - 10.5 K/uL  RBC 2.63 (L) 3.87 - 5.11 MIL/uL   Hemoglobin 8.5 (L) 12.0 - 15.0 g/dL   HCT 25.6 (L) 36.0 - 46.0 %   MCV 97.3 80.0 - 100.0 fL   MCH 32.3 26.0 - 34.0 pg   MCHC 33.2 30.0 - 36.0 g/dL   RDW 14.8 11.5 - 15.5 %   Platelets 199 150 - 400 K/uL   nRBC 0.0 0.0 - 0.2 %   Neutrophils Relative % 69 %   Neutro Abs 3.3 1.7 - 7.7 K/uL   Lymphocytes Relative 22 %   Lymphs Abs 1.1 0.7 - 4.0 K/uL   Monocytes Relative 8 %   Monocytes Absolute 0.4 0.1 - 1.0 K/uL   Eosinophils Relative 0 %   Eosinophils Absolute 0.0 0.0 - 0.5 K/uL   Basophils Relative 1 %   Basophils Absolute 0.0 0.0 - 0.1 K/uL   Immature Granulocytes 0 %   Abs Immature Granulocytes 0.02  0.00 - 0.07 K/uL    Comment: Performed at [facility] [address], [place], [place] [address]  Comprehensive metabolic panel     Status: Abnormal   Collection Time: 09/21/21  2:20 AM  Result Value Ref Range   Sodium 142 135 - 145 mmol/L   Potassium 4.1 3.5 - 5.1 mmol/L   Chloride 109 98 - 111 mmol/L   CO2 20 (L) 22 - 32 mmol/L   Glucose, Bld 122 (H) 70 - 99 mg/dL    Comment: Glucose reference range applies only to samples taken after fasting for at least 8 hours.   BUN 19 8 - 23 mg/dL   Creatinine, Ser 1.09 (H) 0.44 - 1.00 mg/dL   Calcium 9.5 8.9 - 10.3 mg/dL   Total Protein 6.4 (L) 6.5 - 8.1 g/dL   Albumin 3.5 3.5 - 5.0 g/dL   AST 32 15 - 41 U/L   ALT 13 0 - 44 U/L   Alkaline Phosphatase 51 38 - 126 U/L   Total Bilirubin 0.8 0.3 - 1.2 mg/dL   GFR, Estimated 51 (L) >60 mL/min    Comment: (NOTE) Calculated using the CKD-EPI Creatinine Equation (2021)    Anion gap 13 5 - 15    Comment: Performed at [facility] [address] [place], [place] [address]  Lipase, blood     Status: None   Collection Time: 09/21/21  2:20 AM  Result Value Ref Range   Lipase 33 11 - 51 U/L    Comment: Performed at [facility] [address] [place], [place] [address]  Troponin I (High Sensitivity)     Status: Abnormal   Collection Time: 09/21/21  2:20 AM  Result Value Ref Range   Troponin I (High Sensitivity) 36 (H) <18 ng/L    Comment: (NOTE) Elevated high sensitivity troponin I (hsTnI) values and significant  changes across serial measurements may suggest ACS but many other  chronic and acute conditions are known to elevate hsTnI results.  Refer to the "Links" section for chest pain algorithms and additional  guidance. Performed at [facility], [place] [address] [place], [place] [address]   Troponin I (High Sensitivity)     Status: Abnormal   Collection Time: 09/21/21  4:10 AM  Result Value Ref Range   Troponin I (High Sensitivity) 37 (H) <18 ng/L    Comment:  (NOTE) Elevated high sensitivity troponin I (hsTnI) values and significant  changes across serial measurements may suggest ACS but many other  chronic and  acute conditions are known to elevate hsTnI results.  Refer to the "Links" section for chest pain algorithms and additional  guidance. Performed at [facility], [place] [address] [place], [place] [address]   Ferritin     Status: None   Collection Time: 09/21/21  4:10 AM  Result Value Ref Range   Ferritin 69 11 - 307 ng/mL    Comment: Performed at [facility], [place] [address] [place], [place] [address]  Urinalysis, Routine w reflex microscopic     Status: Abnormal   Collection Time: 09/21/21  7:00 AM  Result Value Ref Range   Color, Urine YELLOW YELLOW   APPearance HAZY (A) CLEAR   Specific Gravity, Urine >1.046 (H) 1.005 - 1.030   pH 5.0 5.0 - 8.0   Glucose, UA NEGATIVE NEGATIVE mg/dL   Hgb urine dipstick MODERATE (A) NEGATIVE   Bilirubin Urine NEGATIVE NEGATIVE   Ketones, ur 5 (A) NEGATIVE mg/dL   Protein, ur NEGATIVE NEGATIVE mg/dL   Nitrite NEGATIVE NEGATIVE   Leukocytes,Ua SMALL (A) NEGATIVE   RBC / HPF 11-20 0 - 5 RBC/hpf   WBC, UA 11-20 0 - 5 WBC/hpf   Bacteria, UA RARE (A) NONE SEEN   Squamous Epithelial / LPF 0-5 0 - 5    Comment: Performed at [facility], [place] [address] [place], [place] [address]  POC occult blood, ED     Status: Abnormal   Collection Time: 09/21/21  7:03 AM  Result Value Ref Range   Fecal Occult Bld POSITIVE (A) NEGATIVE  Hemoglobin and hematocrit, blood     Status: Abnormal   Collection Time: 09/21/21  7:14 AM  Result Value Ref Range   Hemoglobin 8.2 (L) 12.0 - 15.0 g/dL   HCT 25.6 (L) 36.0 - 46.0 %    Comment: Performed at [facility] [address] [place], [place] [address]  Protime-INR     Status: Abnormal   Collection Time: 09/21/21 11:14 AM  Result Value Ref Range   Prothrombin Time 17.8 (H) 11.4 - 15.2 seconds   INR 1.5 (H) 0.8 - 1.2    Comment:  (NOTE) INR goal varies based on device and disease states. Performed at [facility], [place] [address], [place], [place] [address]   APTT     Status: Abnormal   Collection Time: 09/21/21 11:14 AM  Result Value Ref Range   aPTT 45 (H) 24 - 36 seconds    Comment:        IF BASELINE aPTT IS ELEVATED, SUGGEST PATIENT RISK ASSESSMENT BE USED TO DETERMINE APPROPRIATE ANTICOAGULANT THERAPY. Performed at [facility], [place] [address] [place], [place] [address]   CBC     Status: Abnormal   Collection Time: 09/21/21  5:50 PM  Result Value Ref Range   WBC 5.1 4.0 - 10.5 K/uL   RBC 2.29 (L) 3.87 - 5.11 MIL/uL   Hemoglobin 7.4 (L) 12.0 - 15.0 g/dL   HCT 23.6 (L) 36.0 - 46.0 %   MCV 103.1 (H) 80.0 - 100.0 fL   MCH 32.3 26.0 - 34.0 pg   MCHC 31.4 30.0 - 36.0 g/dL   RDW 15.2 11.5 - 15.5 %   Platelets 209 150 - 400 K/uL   nRBC 0.0 0.0 - 0.2 %    Comment: Performed at [facility], [place] [address] [place],  [address]   CT ABDOMEN PELVIS W CONTRAST  Result Date: 09/21/2021 CLINICAL DATA:  Acute, nonlocalized abdominal pain. Long-standing pain EXAM: CT ABDOMEN AND PELVIS WITH CONTRAST TECHNIQUE: Multidetector CT imaging of the abdomen and pelvis was performed using the standard protocol following bolus administration of intravenous contrast. RADIATION DOSE REDUCTION: This exam was performed according to the departmental dose-optimization program which includes automated exposure control, adjustment of the mA and/or kV according to patient size and/or use of iterative reconstruction technique. CONTRAST:  39m OMNIPAQUE IOHEXOL 300 MG/ML  SOLN COMPARISON:  12/24/2020 FINDINGS: Lower chest: Cardiac enlargement and coronary atherosclerosis. No acute finding Hepatobiliary: No focal liver abnormality.No evidence of biliary obstruction or stone. Pancreas: Unremarkable. Spleen: Unremarkable. Adrenals/Urinary Tract: Negative adrenals. No hydronephrosis or stone. Symmetric thinned appearance  of renal cortex. Unremarkable bladder. Stomach/Bowel: No obstruction. No appendicitis. Left colonic diverticulosis. Vascular/Lymphatic: No acute vascular abnormality. Extensive atheromatous plaque and irregularity of the aorta and iliacs. No mass or adenopathy. Reproductive:No pathologic findings. Other: No ascites or pneumoperitoneum. Musculoskeletal: No acute abnormalities. 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 Generalized osteopenia. IMPRESSION: No acute finding.  Stable from 2022. Electronically Signed   By: JJorje GuildM.D.   On: 09/21/2021 05:18   DG Abdomen Acute W/Chest  Result Date: 09/21/2021 CLINICAL DATA:  Chest and abdominal pain. EXAM: DG ABDOMEN ACUTE WITH 1 VIEW CHEST COMPARISON:  Chest radiograph dated 03/01/2011. FINDINGS: No focal consolidation, pleural effusion or pneumothorax. Mild cardiomegaly. Atherosclerotic calcification of the aorta. Large amount of stool throughout the colon. No bowel dilatation or evidence of obstruction. No free air or radiopaque calculi. Osteopenia with degenerative changes of the spine. No acute osseous pathology. Total right hip arthroplasty. IMPRESSION: 1. No acute cardiopulmonary process. 2. Constipation. No bowel obstruction. Electronically Signed   By: AAnner CreteM.D.   On: 09/21/2021 02:59    Pending Labs Unresulted Labs (From admission, onward)     Start     Ordered   09/22/21 1700  CBC  5A & 5P,   R      09/21/21 1919   09/22/21 08127 Basic metabolic panel  Tomorrow morning,   R        09/21/21 0938   09/22/21 0001  CBC  Once,   R        09/21/21 1919   09/21/21 1200  Iron and TIBC  Once,   R        09/21/21 1200            Vitals/Pain Today's Vitals   09/21/21 1600 09/21/21 1609 09/21/21 1920 09/21/21 1921  BP: (!) 147/59  103/65 103/65  Pulse: 74   78  Resp: 18   18  Temp:  98.4 F (36.9 C)    TempSrc:  Oral    SpO2: 98%   99%  Weight:      Height:      PainSc:        Isolation  Precautions No active isolations  Medications Medications  pantoprazole (PROTONIX) injection 40 mg (has no administration in time range)  albuterol (PROVENTIL) (2.5 MG/3ML) 0.083% nebulizer solution 2.5 mg (has no administration in time range)  DULoxetine (CYMBALTA) DR capsule 60 mg (60 mg Oral Given 09/21/21 1030)  fluticasone furoate-vilanterol (BREO ELLIPTA) 100-25 MCG/ACT 1 puff (1 puff Inhalation Not Given 09/21/21 1921)    And  umeclidinium bromide (INCRUSE ELLIPTA) 62.5 MCG/ACT 1 puff (1 puff Inhalation Not Given 09/21/21 1921)  nicotine (NICODERM CQ - dosed in mg/24 hours) patch 14 mg (14 mg Transdermal Patch Applied 09/21/21 1253)  thiamine (VITAMIN B1) tablet 100 mg (  100 mg Oral Given 09/21/21 1254)    Or  thiamine (VITAMIN B1) injection 100 mg ( Intravenous See Alternative 01/17/53 2080)  folic acid (FOLVITE) tablet 1 mg (1 mg Oral Given 09/21/21 1254)  multivitamin with minerals tablet 1 tablet (1 tablet Oral Given 09/21/21 1254)  ondansetron (ZOFRAN) injection 4 mg (has no administration in time range)  digoxin (LANOXIN) tablet 0.125 mg (has no administration in time range)  dorzolamide (TRUSOPT) 2 % ophthalmic solution 1 drop (has no administration in time range)  latanoprost (XALATAN) 0.005 % ophthalmic solution 1 drop (has no administration in time range)  rosuvastatin (CRESTOR) tablet 20 mg (has no administration in time range)  pregabalin (LYRICA) capsule 75 mg (has no administration in time range)  metoprolol succinate (TOPROL-XL) 24 hr tablet 25 mg (has no administration in time range)  traZODone (DESYREL) tablet 50 mg (has no administration in time range)  iohexol (OMNIPAQUE) 300 MG/ML solution 80 mL (80 mLs Intravenous Contrast Given 09/21/21 0431)  pantoprazole (PROTONIX) injection 40 mg (40 mg Intravenous Given 09/21/21 0812)  fentaNYL (SUBLIMAZE) injection 25 mcg (25 mcg Intravenous Given 09/21/21 0909)  lactated ringers bolus 500 mL (0 mLs Intravenous Stopped 09/21/21 0953)     Mobility walks with person assist Moderate fall risk   Focused Assessments Pulmonary Assessment Handoff:  Lung sounds:   O2 Device: Room Air O2 Flow Rate (L/min): 17 L/min    R Recommendations: See Admitting Provider Note  Report given to:   Additional Notes: pt is AAOx4, but can be confused at times. Pt is on RA. Pt has on brief.

## 2021-09-21 NOTE — ED Notes (Signed)
Pt's son- in-law, Wille Glaser, updated with dispo and plan of care

## 2021-09-21 NOTE — Discharge Instructions (Addendum)
Dear Ms. Mies, it has been a pleasure caring for you and I am so happy to see you doing well! You were hospitalized for severe abdominal pain and treated for upper GI bleed. Since then you have recovered, you are no longer reporting severe abdominal pain. We started you on a PPI. We also continued a lot of your home medications to manage your hypertension and A-fib. In addition to medications, you were also seen by a gastroenterologist.   When you are discharged we would like you to do the following:  1. You were started on the following medications in the hospital. Please continue them using these following instructions: -Protonix 40 mg twice a day  2.  Some of your medication regimen has been changed.  Please see the following instructions for the changes: -Restart Eliquis tomorrow -Hold your home aspirin  2. Continue taking the rest of the medications as you were before you came to the hospital.   3. Follow-up with your cardiologist, gastroenterologist, and PCP

## 2021-09-21 NOTE — ED Triage Notes (Signed)
BIB [facility] from home. Epigastric pain. 'Very low time.' Could not describe what was worse today. Son in law may be of help- on the way. Hx dementia.  129/54 80HR NSR - PACs 95% CBG148

## 2021-09-21 NOTE — ED Notes (Signed)
Called [facility] and requested purple man be initiated.  

## 2021-09-21 NOTE — ED Notes (Signed)
Called son in law (Joe), notified that pt is not up for discharge at this time and is requiring further workup - will update when dispo is set

## 2021-09-21 NOTE — ED Notes (Signed)
Admitting at bedside 

## 2021-09-21 NOTE — H&P (Signed)
Date: 09/21/2021               Patient Name:  Glenisha Gundry MRN: [id number]  DOB: 03-20-1940 Age / Sex: 81 y.o., female   PCP: Joya Gaskins, FNP         Medical Service: Internal Medicine Teaching Service         Attending Physician: Dr. Lottie Mussel, MD    First Contact: Dr. Stormy Fabian, MD  Pager: [phone]  Second Contact: Dr. Linwood Dibbles, MD Pager: [phone]       After Hours (After 5p/  First Contact Pager: [phone]  weekends / holidays): Second Contact Pager: [phone]   Chief Complaint: Acute on chronic abdominal pain   History of Present Illness:   Ms Morgen Linebaugh is a 81yo F with a PMH of pAfib on chronic anticoagulation with eliquis, HTN, HLD, chronic back pain, COPD, ?hypothyroidism, ongoing tobacco use disorder, and moderate MR who presents with acute worsening abdominal pain.   Patient states that the day prior to admission she was awoken out of her sleep with abdominal pain. She states that the pain starts at her back and goes to the front, and is primarily concentrated in the LUQ. She states that the pain has been ongoing for several years and she has undergone multiple extensive work ups that have been unrevealing and usually resolves with her lyrica. She does not recall specific precipitating factors as the pain is not necessarily provoked by activity or food ingestion. Because her pain was persistent she decided to present. She states she has had a poor appetite (But also does not eat much as she is missing several teeth and it is difficult for her to chew), no vomiting or diarrhea, melena, or hematochezia, or hematemesis. She has had some constipation for the past week, but did have a small bowel movement the day prior to admission which did not help relieve her pain.   Of note, patient has a history of perforated duodenal ulcer which was repaired 05/2016. At that time she had been taking mobic and PRN ibuprofen. She last had EGD 05/2016 no evidence of  extravasation. Her last colonoscopy: Colonoscopy July 2014 in [place]. This was done for personal history of polyps. Findings included "patchy discontinuous erosions in the sigmoid and rectum... These findings are compatible with colitis". Biopsies were taken. A single 4 mm polyp was found at the hepatic flexure and it was removed. No pathology reports from this procedure.   Meds:  Current Meds  Medication Sig   albuterol (VENTOLIN HFA) 108 (90 Base) MCG/ACT inhaler Inhale 2 puffs into the lungs every 6 (six) hours as needed for wheezing or shortness of breath.   ASPIRIN LOW DOSE 81 MG tablet Take 81 mg by mouth daily.   carboxymethylcellulose (REFRESH PLUS) 0.5 % SOLN Place 1 drop into both eyes 3 (three) times daily as needed (dry eyes).   digoxin (LANOXIN) 0.125 MG tablet TAKE 1 TABLET(0.125 MG) BY MOUTH DAILY (Patient taking differently: Take 0.125 mg by mouth daily.)   dorzolamide (TRUSOPT) 2 % ophthalmic solution Place 1 drop into the left eye 3 (three) times daily.   DULoxetine (CYMBALTA) 60 MG capsule Take 60 mg by mouth daily.   ELIQUIS 2.5 MG TABS tablet TAKE 1 TABLET(2.5 MG) BY MOUTH TWICE DAILY (Patient taking differently: Take 2.5 mg by mouth 2 (two) times daily.)   EPINEPHrine 0.3 mg/0.3 mL IJ SOAJ injection Inject 0.3 mg into the muscle as needed for anaphylaxis.  ferrous sulfate 325 (65 FE) MG EC tablet Take 1 tablet (325 mg total) by mouth daily with breakfast.   Fluticasone-Umeclidin-Vilant (TRELEGY ELLIPTA) 100-62.5-25 MCG/ACT AEPB Inhale 1 puff into the lungs daily.   folic acid (FOLVITE) 1 MG tablet Take 1 mg by mouth daily.   hydrOXYzine (ATARAX) 10 MG tablet Take 10 mg by mouth daily as needed for anxiety.   latanoprost (XALATAN) 0.005 % ophthalmic solution Place 1 drop into the left eye at bedtime.   nicotine (NICODERM CQ - DOSED IN MG/24 HOURS) 21 mg/24hr patch Place 1 patch (21 mg total) onto the skin daily. (Patient taking differently: Place 14 mg onto the skin daily.)    ondansetron (ZOFRAN) 4 MG tablet Take 1 tablet (4 mg total) by mouth every 8 (eight) hours as needed for nausea or vomiting.   OXYGEN Inhale 1 L into the lungs daily as needed (shortness of breath).   polyethylene glycol powder (GLYCOLAX/MIRALAX) 17 GM/SCOOP powder Take 17 g by mouth daily as needed for constipation.   pregabalin (LYRICA) 75 MG capsule Take 75 mg by mouth 2 (two) times daily.   rosuvastatin (CRESTOR) 40 MG tablet Take 40 mg by mouth daily.   Spacer/Aero-Holding Chambers (VORTEX VALVED HOLDING CHAMBER) DEVI by Does not apply route.   traZODone (DESYREL) 100 MG tablet Take 100 mg by mouth at bedtime as needed for sleep.     Allergies: Allergies as of 09/21/2021 - Review Complete 09/21/2021  Allergen Reaction Noted   Bactrim [sulfamethoxazole-trimethoprim] Diarrhea and Nausea And Vomiting 07/13/2016   Bee venom Anaphylaxis 07/13/2016   Past Medical History:  Diagnosis Date   Anemia    Anxiety    Arthritis    B12 deficiency 05/2015   B12 was 184   Cancer (HCC)    CHF (congestive heart failure) (HCC)    COPD (chronic obstructive pulmonary disease) (HCC)    Crohn's disease, small intestine ([place]) 02/19/2009   Depression 10/2013   chronic recurrent major depressive disorder   Dysrhythmia    H/O vitamin D deficiency 08/04/2008   Heart murmur    Hypertension    Hypothyroidism    Pneumonia    Recurrent dislocation of hip, right    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 (07/2021) Protonix  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00    Total pack years: 58.00    Types: Cigarettes   Smokeless tobacco: Never   Tobacco comments:    trying to quit, wearing nicotine patch. Cautioned against smoking and wearing patch.1/4 ppd 03/18/21  Vaping Use   Vaping Use: Never used  Substance and Sexual Activity   Alcohol use: Yes  Comment: OCC   Drug use: No   Sexual activity: Not Currently  Other Topics Concern   Not on file  Social History Narrative   Not on file   Social Determinants of Health   Financial Resource Strain: Not on file  Food Insecurity: Not on file  Transportation Needs: Not on file  Physical Activity: Not on file  Stress: Not on file  Social Connections: Not on file  Intimate Partner Violence: Not on file   She drinks a glass and a half of wine per day. She states she no longer drinks "hard" alcohol.  She does continue to smoke cigarettes.  Lives with her daughter, son-in-law, and grandson. Moved from [place] as she needed help with her medical care. Admits that she sometimes has trouble remembering certain events.   Review of Systems: A complete ROS was negative except as per HPI.   Physical Exam: Blood pressure (!) 153/52, pulse 69, temperature 98.6 F (37 C), temperature source Oral, resp. rate 15, height 5' 1.5" (1.562 m), weight 40 kg, SpO2 96 %.  Constitutional: thin, and in no distress.  HENT:  Head: Normocephalic and atraumatic.  Eyes: EOM are normal. No vision in L eye Neck: Normal range of motion.  Cardiovascular: Normal rate, regular rhythm, intact distal pulses. No gallop and no friction rub. No lower extremity edema. Systolic murmur  Pulmonary: Non labored breathing on room air, no wheezing or rales  Abdominal: Soft. Normal bowel sounds. Non distended. TTP in  LUQ.  Musculoskeletal: Normal range of motion.        General: No tenderness or edema.  Neurological: Alert and oriented to person, place, and time. Non focal  Skin: Skin is warm and dry.  Rectal: Deferred, performed by GI   EKG: personally reviewed my interpretation is ST depression V5-6, rare PAC, similar to prior 07/22/21  CT A/P:   FINDINGS: Lower chest: Cardiac enlargement and coronary atherosclerosis. No acute finding   Hepatobiliary: No focal liver abnormality.No evidence of biliary obstruction or stone.   Pancreas: Unremarkable.   Spleen: Unremarkable.   Adrenals/Urinary Tract: Negative adrenals. No hydronephrosis or stone. Symmetric thinned appearance of renal cortex. Unremarkable bladder.   Stomach/Bowel: No obstruction. No appendicitis. Left colonic diverticulosis.   Vascular/Lymphatic: No acute vascular abnormality. Extensive atheromatous plaque and irregularity of the aorta and iliacs. No mass or adenopathy.   Reproductive:No pathologic findings.   Other: No ascites or pneumoperitoneum.   Musculoskeletal: No acute abnormalities. 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 Generalized osteopenia.   IMPRESSION: No acute finding.  Stable from 2022  Assessment & Plan by Problem: Principal Problem:   GI bleed  Ms Armani Gawlik is a 81yo F with a PMH of pAfib on chronic anticoagulation with eliquis, HTN, HLD, chronic back pain, COPD, ?hypothyroidism, ongoing tobacco use disorder, and moderate MR who presents with acute on chronic abdominal pain and ultimately had a melenic stool c/f UGIB.    #GI Bleed #H/o perforated duodenal ulcer #Normocytic anemia  Patient presents with acute worsening of her chronic abdominal pain that is persistent. She has had duodenal ulcer perforation in the past requiring repair. The operative report from this surgery is not available. She is currently HDS. Although her Hgb level is ~1g less than the last recorded  CBC about 6 months ago. In addition, she was noted to have + FOBT. Patient does continue to drink alcohol and smoke cigarettes which are risk factors for developing GI ulcers. Patient is not aware of her medications but states  that her son in law handles all of her medications and he notes that she does not take any NSAIDs.  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 She was to continue statin therapy as well.   #Crohn's dz  -Not currently on any medications for this  #pAfib Rate controlled. On eliquis, digoxin and metoprolol at home for this.  CHA2DS2-VASc Score is 5.  Yearly risk of stroke: 7.2% (F, A, HTN, Vasc Dz).  Score of 1=0.6; 2=2.2; 3=3.2; 4=4.8; 5=7.2; 6=9.8; 7=>9.8) -(CHF; HTN; vasc disease DM,  Female = 1; Age <65 =0; 65-74 = 1,  >75 =2; stroke/embolism= 2).   -Will resume digoxin -Hold eliquis in the setting of GI bleed  #?Hypothyroidism  Not currently on any thyroid hormone replacement. Her last TFTs were 07/2021  Ref Range & Units 1 mo ago  Thyroid Stimulating Hormone (TSH) 0.34 - 5.66 IU/mL 5.94 High    Thyroxine, Free (FT4) 0.52 - 1.21 ng/dL 0.70     #Hyperlipidemia  LDL remains elevated (144). Continue home crestor that she was discharged on.   #HTN  Well controlled  #COPD Continue home inhalers  #TUD Consult TOC, continue nicotine patch for this.   #Moderate MR  Patient does not currently appear volume overloaded. She follows regularly with cardiology last seen 07/22/21. Will have follow up in 6 months with repeat TTE.   Dispo: Admit patient to Inpatient with expected length of stay greater than 2 midnights.  Signed: Rick Duff, MD 09/21/2021, 2:00 PM  After 5pm on weekdays and 1pm on weekends: On Call pager: [phone]

## 2021-09-21 NOTE — H&P (View-Only) (Signed)
[facility] Consult: 8:08 AM 09/21/2021  LOS: 0 days    Referring Provider: ED Dr Doren Custard  Primary Care Physician:  Joya Gaskins, FNP Primary Gastroenterologist:  Dr. Owens Loffler.  Seen once for office visit, he did not perform any procedures.    Reason for Consultation:  epig pain, melena, anemia.     HPI: Kelly [name] is a 81 y.o. female.  PMH COPD.  Hypothyroidism.  Hypertension.  Major depression.  Dementia.  Vascular disease.  Bilateral carotid stenosis.  B12 deficiency.  CHF- diastolic, cardiomegaly.  Anemia.  Crohn's disease of small intestine.  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  Severe prot cal malnutrition per [facility] admission note 08/02/2021.  Surgeries include laparoscopy, repair duodenal ulcer (ulcer attributed to NSAIDs).  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  Cystoscopy, ureteroscopy and stent placement 02/2021 tubal ligation.  Tonsillectomy. Med list includes Eliquis, iron sulfate, Protonix.   Hospitalized for 10 days starting 08/02/2021 for acute left vision loss..  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  Underwent left carotid endarterectomy 7/19.  07/2012 colonoscopy.  Performed by MD in [place].  4 mm polyp removed from hepatic flexure.  Erosions in sigmoid and rectum consistent with colitis.  Descending, sigmoid diverticulosis. 05/2016 UGI series.  In [place].  For follow-up repair perforated duodenal ulcer, postop day 4.  No evidence for leak, intact proximal small bowel.  Complains of pain in the epigastrium/right upper quadrant that radiates and also arises from pain in the left scapular area.  This has been there for "years" but its worse in the last few days.  No associated nausea.  Also says she spent yesterday in bed she was just  sleepy and did not feel well though nothing specific seem to be the matter other than the upper abdominal pain.  She is not eating as much since her dentures "are in the shop" and she is consuming last.  She acknowledges weight loss of unclear quantity. 2V abdomen with chest.  No acute cardiopulmonary process.  Constipation, no bowel obstruction.  Osteopenia of spine.  Manges of right hip arthroplasty CTAP w contrast.  FOBT positive. Hgb 8.5- 8.2, was 11.6-9.8 in February 2023.  MCV normal at 97.  Platelets, WBCs normal BUN normal 19.  Creatinine elevated 1.0.  GFR reduced at 51.  LFTs, lipase normal. Troponins I: 36, 37.  Consumes a bottle of wine over the course of 3 days.  Quit drinking vodka several weeks ago.  Until recently smoking 1 pack cigarettes daily but now on patch and has not smoked for at least a month. She lives with her daughter, son-in-law and disabled grand son in [place].  Her husband still lives in [place].  Past Medical History:  Diagnosis Date   Anemia    Anxiety    Arthritis    B12 deficiency 05/2015   B12 was 184   Cancer (HCC)    CHF (congestive heart failure) (HCC)    COPD (chronic obstructive pulmonary disease) (HCC)    Crohn's disease, small intestine ([facility]) 02/19/2009   Depression  10/2013   chronic recurrent major depressive disorder   Dysrhythmia    H/O vitamin D deficiency 08/04/2008   Heart murmur    Hypertension    Hypothyroidism    Pneumonia    Recurrent dislocation of hip, right     Past Surgical History:  Procedure Laterality Date   CYSTOSCOPY WITH RETROGRADE PYELOGRAM, URETEROSCOPY AND STENT PLACEMENT Left 02/17/2021   Procedure: CYSTOSCOPY WITH  RETROGRADE PYELOGRAM, LEFT URETEROSCOPY , BIOPSY AND tumor ablation  STENT PLACEMENT;  Surgeon: Alexis Frock, MD;  Location: [facility];  Service: Urology;  Laterality: Left;   HOLMIUM LASER APPLICATION Left 0/01/930   Procedure: HOLMIUM LASER APPLICATION;  Surgeon: Alexis Frock, MD;  Location: [facility]  [facility];  Service: Urology;  Laterality: Left;   JOINT REPLACEMENT     Right hip, 2015   LAPAROSCOPY  06/03/2016   Duodenal ulcer repair   TONSILLECTOMY     TUBAL LIGATION      Prior to Admission medications   Medication Sig Start Date End Date Taking? Authorizing Provider  albuterol (VENTOLIN HFA) 108 (90 Base) MCG/ACT inhaler Inhale 2 puffs into the lungs every 6 (six) hours as needed for wheezing or shortness of breath. 03/18/21   Maryjane Hurter, MD  atorvastatin (LIPITOR) 40 MG tablet Take 1 tablet (40 mg total) by mouth at bedtime. 09/30/20   Cantwell, Celeste C, PA-C  b complex vitamins tablet Take 1 tablet by mouth daily.    [provider]  digoxin (LANOXIN) 0.125 MG tablet TAKE 1 TABLET(0.125 MG) BY MOUTH DAILY 07/09/21   Cantwell, Celeste C, PA-C  DULoxetine (CYMBALTA) 60 MG capsule Take 60 mg by mouth daily. 08/19/20   [provider]  ELIQUIS 2.5 MG TABS tablet TAKE 1 TABLET(2.5 MG) BY MOUTH TWICE DAILY 08/19/21   Adrian Prows, MD  EPINEPHrine 0.3 mg/0.3 mL IJ SOAJ injection Inject 0.3 mg into the muscle as needed for anaphylaxis.    [provider]  ferrous sulfate 325 (65 FE) MG EC tablet Take 1 tablet (325 mg total) by mouth daily with breakfast. 08/02/16   Martinique, Betty G, MD  Fluticasone-Umeclidin-Vilant (TRELEGY ELLIPTA) 100-62.5-25 MCG/ACT AEPB Inhale 1 puff into the lungs daily. 03/18/21   Maryjane Hurter, MD  Fluticasone-Umeclidin-Vilant (TRELEGY ELLIPTA) 100-62.5-25 MCG/ACT AEPB Inhale 1 puff into the lungs daily. 03/18/21   Maryjane Hurter, MD  folic acid (FOLVITE) 1 MG tablet Take 1 mg by mouth daily.    [provider]  metoprolol succinate (TOPROL-XL) 25 MG 24 hr tablet Take 25 mg by mouth daily.    [provider]  nicotine (NICODERM CQ - DOSED IN MG/24 HOURS) 21 mg/24hr patch Place 1 patch (21 mg total) onto the skin daily. 06/04/20   Adrian Prows, MD  ondansetron (ZOFRAN) 4 MG tablet Take 1 tablet (4 mg total) by mouth every 8  (eight) hours as needed for nausea or vomiting. 02/28/21 02/28/22  Pokhrel, Corrie Mckusick, MD  pantoprazole (PROTONIX) 40 MG tablet Take 40 mg by mouth daily.    [provider]  pregabalin (LYRICA) 50 MG capsule Take 50 mg by mouth 2 (two) times daily.    [provider]  REPATHA 140 MG/ML SOSY INJECT 1 ML UNDER THE SKIN EVERY 14 DAYS AS DIRECTED 05/11/21   Cantwell, Gerline Legacy, PA-C  Spacer/Aero-Holding Chambers (VORTEX VALVED HOLDING CHAMBER) DEVI by Does not apply route.    [provider]  varenicline (CHANTIX PAK) 0.5 MG X 11 & 1 MG X 42 tablet Take one 0.5  mg tablet by mouth once daily for 3 days, then increase to one 0.5 mg tablet twice daily for 4 days, then increase to one 1 mg tablet twice daily. 03/18/21   Maryjane Hurter, MD  varenicline (CHANTIX) 1 MG tablet TAKE 1 TABLET(1 MG) BY MOUTH TWICE DAILY 03/18/21   Maryjane Hurter, MD    Scheduled Meds:  fentaNYL (SUBLIMAZE) injection  25 mcg Intravenous Once   pantoprazole (PROTONIX) IV  40 mg Intravenous Once   Infusions:  lactated ringers     PRN Meds:    Allergies as of 09/21/2021 - Review Complete 07/22/2021  Allergen Reaction Noted   Bactrim [sulfamethoxazole-trimethoprim] Diarrhea and Nausea And Vomiting 07/13/2016   Bee venom Anaphylaxis 07/13/2016    Family History  Problem Relation Age of Onset   Heart disease Mother    Alcohol abuse Mother    Colon cancer Sister    Breast cancer Sister    Heart attack Brother    Cancer Neg Hx    Diabetes Neg Hx     Social History   Socioeconomic History   Marital status: Married    Spouse name: Not on file   Number of children: Not on file   Years of education: Not on file   Highest education level: Not on file  Occupational History   Not on file  Tobacco Use   Smoking status: Every Day    Packs/day: 1.00    Years: 58.00    Total pack years: 58.00    Types: Cigarettes   Smokeless tobacco: Never   Tobacco comments:    trying to quit, wearing  nicotine patch. Cautioned against smoking and wearing patch.1/4 ppd 03/18/21  Vaping Use   Vaping Use: Never used  Substance and Sexual Activity   Alcohol use: Yes    Comment: OCC   Drug use: No   Sexual activity: Not Currently  Other Topics Concern   Not on file  Social History Narrative   Not on file   Social Determinants of Health   Financial Resource Strain: Not on file  Food Insecurity: Not on file  Transportation Needs: Not on file  Physical Activity: Not on file  Stress: Not on file  Social Connections: Not on file  Intimate Partner Violence: Not on file    REVIEW OF SYSTEMS: Constitutional: Sleepiness, weakness. ENT: Periodic low to moderate volume nosebleeds. Pulm: No new cough or shortness of breath. CV:  No palpitations, no LE edema.  Aware of her heart murmur.  No angina. GU:  No hematuria, no frequency GI: See HPI. Heme: No excessive bruising.  Nosebleeds but no other unusual bleeding. Transfusions: No recall of previous blood transfusions. Neuro:  No headaches, no peripheral tingling or numbness.  No dizziness, no syncope, no seizures. Derm: Had lesions biopsied/burned at the dermatologist last week.  Has sores at the sites where this was performed. Endocrine:  No sweats or chills.  No polyuria or dysuria Immunization: Reviewed.  Not queried. Travel: Not queried.   PHYSICAL EXAM: Vital signs in last 24 hours: Vitals:   09/21/21 0711 09/21/21 0806  BP: 99/81   Pulse: 66   Resp: 16   Temp:  97.9 F (36.6 C)  SpO2: 98%    Wt Readings from Last 3 Encounters:  09/21/21 40 kg  07/22/21 43.5 kg  05/05/21 41.3 kg    General: Patient is frail, thin but not acutely or chronically ill-appearing.  She is comfortable and able to provide a decent history. Head:  No facial asymmetry or swelling.  No signs of head trauma.  There are scars all about the nose from her recent dermatologist visit. Eyes: Conjunctiva slightly pale.  No scleral icterus.  EOMI.  No  cloudy pupils Ears: Not hard of hearing Nose: No congestion or discharge Mouth: Poor dentition.  The bulk of her teeth are absent and remaining teeth are snugly and in poor repair.  Mucosa is moist, pink, clear.  Tongue is midline.  No oral lesions. Neck: No JVD, no masses, no thyromegaly. Lungs: No labored breathing or cough.  Lungs clear bilaterally. Heart: Systolic murmur.  Regular rate and rhythm.  S1, S2 present. Abdomen: Thin, soft.  No distention.  Bowel sounds active.  Tenderness in the epigastrium, right upper quadrant without guarding or rebound.  No masses, HSM, bruits, hernias.  Well-healed surgical upper midline scar..   Rectal: Black, soft stool.  No palpable or visible masses.  Stool previously submitted to lab was FOBT positive.  My sample was not tested for FOB Musc/Skeltl: Osteopenic overall appearance. Extremities: No CCE Neurologic: Oriented to hospital, 2023, self, [place].  Answers questions appropriately.  Moves all 4 limbs, no tremor or gross weakness though formal strength testing not performed. Skin: Lesions on her face from recent dermatology interventions. Nodes: No cervical adenopathy Psych: Pleasant, cooperative, animated.  Intake/Output from previous day: No intake/output data recorded. Intake/Output this shift: No intake/output data recorded.  LAB RESULTS: Recent Labs    09/21/21 0220 09/21/21 0714  WBC 4.8  --   HGB 8.5* 8.2*  HCT 25.6* 25.6*  PLT 199  --    BMET Lab Results  Component Value Date   NA 142 09/21/2021   NA 135 02/28/2021   NA 136 02/27/2021   K 4.1 09/21/2021   K 3.6 02/28/2021   K 3.7 02/27/2021   CL 109 09/21/2021   CL 99 02/28/2021   CL 98 02/27/2021   CO2 20 (L) 09/21/2021   CO2 28 02/28/2021   CO2 27 02/27/2021   GLUCOSE 122 (H) 09/21/2021   GLUCOSE 96 02/28/2021   GLUCOSE 109 (H) 02/27/2021   BUN 19 09/21/2021   BUN 26 (H) 02/28/2021   BUN 30 (H) 02/27/2021   CREATININE 1.09 (H) 09/21/2021   CREATININE 1.18  (H) 02/28/2021   CREATININE 1.32 (H) 02/27/2021   CALCIUM 9.5 09/21/2021   CALCIUM 8.3 (L) 02/28/2021   CALCIUM 9.1 02/27/2021   LFT Recent Labs    09/21/21 0220  PROT 6.4*  ALBUMIN 3.5  AST 32  ALT 13  ALKPHOS 51  BILITOT 0.8   PT/INR No results found for: "INR", "PROTIME" Hepatitis Panel No results for input(s): "HEPBSAG", "HCVAB", "HEPAIGM", "HEPBIGM" in the last 72 hours. C-Diff No components found for: "CDIFF" Lipase     Component Value Date/Time   LIPASE 33 09/21/2021 0220    Drugs of Abuse  No results found for: "LABOPIA", "COCAINSCRNUR", "LABBENZ", "AMPHETMU", "THCU", "LABBARB"   RADIOLOGY STUDIES: DG Abdomen Acute W/Chest  Result Date: 09/21/2021 CLINICAL DATA:  Chest and abdominal pain. EXAM: DG ABDOMEN ACUTE WITH 1 VIEW CHEST COMPARISON:  Chest radiograph dated 03/01/2011. FINDINGS: No focal consolidation, pleural effusion or pneumothorax. Mild cardiomegaly. Atherosclerotic calcification of the aorta. Large amount of stool throughout the colon. No bowel dilatation or evidence of obstruction. No free air or radiopaque calculi. Osteopenia with degenerative changes of the spine. No acute osseous pathology. Total right hip arthroplasty. IMPRESSION: 1. No acute cardiopulmonary process. 2. Constipation. No bowel obstruction. Electronically Signed  By: Anner Crete M.D.   On: 09/21/2021 02:59      IMPRESSION:     FOBT +, melnic stool w drop Hgb.  Hx perf DU (NSAID attributed) repair in 2018.  Meds include Protonix 40 mg daily.      Chronic Eliquis.  Last dose 9/4 in PM.    PLAN:       EGD.  TIming TBD.  9 6 versus 9/7 so far has received Protonix 40 mg IV once.  Protonix 40 mg IV bid.  Clear liquids.  Cbc bid/q 12 h.     Azucena Freed  09/21/2021, 8:08 AM Phone [phone]   Attending physician's note  I have taken a history, reviewed the chart and examined the patient. I performed a substantive portion of this encounter, including complete  performance of at least one of the key components, in conjunction with the APP. I agree with the APP's note, impression and recommendations.    81 year old female with history of CHF, Crohn's disease, glaucoma, hypothyroidism, dementia, urothelial carcinoma, perforated duodenal ulcer presented to ER with worsening epigastric and left upper quadrant abdominal pain associated with melena and worsening anemia Plan to proceed with EGD for further evaluation, tentatively plan for tomorrow Clear liquids N.p.o. after midnight Monitor hemoglobin and transfuse if below 7 Continue PPI IV twice daily Hold Eliquis  The risks and benefits as well as alternatives of endoscopic procedure(s) have been discussed and reviewed. All questions answered. The patient agrees to proceed.   The patient was provided an opportunity to ask questions and all were answered. The patient agreed with the plan and demonstrated an understanding of the instructions.  Damaris Hippo , MD [phone]

## 2021-09-21 NOTE — Progress Notes (Signed)
Pt arrived to unit, assessed, informed of POC

## 2021-09-21 NOTE — ED Provider Notes (Addendum)
Patient presented for epigastric pain.  CT imaging was negative for acute findings.  She has a hemoglobin drop since February.  She had an episode of melena here, Hemoccult positive.  She has a history of perforated duodenal ulcer in 2018.  Repeat CBC is pending.  Physical Exam  BP (!) 118/96 (BP Location: Right Arm)   Pulse 74   Temp 98 F (36.7 C) (Oral)   Resp 16   Ht 5' 1.5" (1.562 m)   Wt 40 kg   SpO2 99%   BMI 16.39 kg/m   Physical Exam Vitals and nursing note reviewed.  Constitutional:      General: She is not in acute distress.    Appearance: She is well-developed and underweight. She is not toxic-appearing or diaphoretic.  HENT:     Head: Normocephalic and atraumatic.     Mouth/Throat:     Mouth: Mucous membranes are moist.     Pharynx: Oropharynx is clear.  Eyes:     Conjunctiva/sclera: Conjunctivae normal.  Cardiovascular:     Rate and Rhythm: Normal rate and regular rhythm.     Heart sounds: No murmur heard. Pulmonary:     Effort: Pulmonary effort is normal. No respiratory distress.  Abdominal:     Palpations: Abdomen is soft.     Tenderness: There is abdominal tenderness in the epigastric area. There is no guarding or rebound.  Musculoskeletal:        General: No swelling.     Cervical back: Neck supple.  Skin:    General: Skin is warm and dry.     Coloration: Skin is not cyanotic, jaundiced or pale.  Neurological:     General: No focal deficit present.     Mental Status: She is alert.  Psychiatric:        Mood and Affect: Mood normal.        Behavior: Behavior normal.     Procedures  Procedures  ED Course / MDM   Clinical Course as of 09/21/21 0745  Tue Sep 21, 2021  0658 Patient had a stool that appeared black and tarry per nursing.  Have requested Hemoccult.  We will repeat hemoglobin.  Patient is hemodynamically stable. [CH]  1245 Chart reviewed.  Patient has an EGD from 2014 which indicated ulcer.  This was done in an outside hospital in  [place]. [place] Attempted to contact family multiple times including son-in-law and daughter.  Unable to get in touch. [CH]    Clinical Course User Index [CH] Horton, Barbette Hair, MD   Medical Decision Making Amount and/or Complexity of Data Reviewed Labs: ordered. Radiology: ordered.  Risk Prescription drug management.   On assessment, patient resting on ED stretcher.  She is awake and alert.  Despite her reported dementia, she is able to provide history but is unclear on the details.  She is aware that she had a surgery for duodenal ulcer in the past.  She currently has continued epigastric pain and tenderness is present on exam.  Repeat CBC shows a slight drop in hemoglobin.  Protonix was ordered for suspected UGIB.  Fentanyl was ordered for analgesia.  GI was consulted.  I spoke with Judson Roch from [facility] who will see the patient in consult.  Patient was admitted to medicine for further management.         Godfrey Pick, MD 09/21/21 [phone]

## 2021-09-21 NOTE — ED Provider Notes (Addendum)
[facility] EMERGENCY DEPARTMENT Provider Note   CSN: [id number] Arrival date & time: 09/21/21  0150     History  Chief Complaint  Patient presents with   Abdominal Pain    Kelly [name] is a 81 y.o. female.  HPI     This is an 81 year old female who presents from home with reports of abdominal pain.  History is somewhat limited as patient has a history of dementia.  She is oriented x2.  Per EMS, was brought in from home with epigastric pain.  Denies any pain at this time.  Denies chest pain.  She is oriented to herself and place.  Unclear what her baseline is.  Home Medications Prior to Admission medications   Medication Sig Start Date End Date Taking? Authorizing Provider  albuterol (VENTOLIN HFA) 108 (90 Base) MCG/ACT inhaler Inhale 2 puffs into the lungs every 6 (six) hours as needed for wheezing or shortness of breath. 03/18/21   Maryjane Hurter, MD  atorvastatin (LIPITOR) 40 MG tablet Take 1 tablet (40 mg total) by mouth at bedtime. 09/30/20   Cantwell, Celeste C, PA-C  b complex vitamins tablet Take 1 tablet by mouth daily.    [provider]  digoxin (LANOXIN) 0.125 MG tablet TAKE 1 TABLET(0.125 MG) BY MOUTH DAILY 07/09/21   Cantwell, Celeste C, PA-C  DULoxetine (CYMBALTA) 60 MG capsule Take 60 mg by mouth daily. 08/19/20   [provider]  ELIQUIS 2.5 MG TABS tablet TAKE 1 TABLET(2.5 MG) BY MOUTH TWICE DAILY 08/19/21   Adrian Prows, MD  EPINEPHrine 0.3 mg/0.3 mL IJ SOAJ injection Inject 0.3 mg into the muscle as needed for anaphylaxis.    [provider]  ferrous sulfate 325 (65 FE) MG EC tablet Take 1 tablet (325 mg total) by mouth daily with breakfast. 08/02/16   Martinique, Betty G, MD  Fluticasone-Umeclidin-Vilant (TRELEGY ELLIPTA) 100-62.5-25 MCG/ACT AEPB Inhale 1 puff into the lungs daily. 03/18/21   Maryjane Hurter, MD  Fluticasone-Umeclidin-Vilant (TRELEGY ELLIPTA) 100-62.5-25 MCG/ACT AEPB Inhale 1 puff into the lungs daily. 03/18/21    Maryjane Hurter, MD  folic acid (FOLVITE) 1 MG tablet Take 1 mg by mouth daily.    [provider]  metoprolol succinate (TOPROL-XL) 25 MG 24 hr tablet Take 25 mg by mouth daily.    [provider]  nicotine (NICODERM CQ - DOSED IN MG/24 HOURS) 21 mg/24hr patch Place 1 patch (21 mg total) onto the skin daily. 06/04/20   Adrian Prows, MD  ondansetron (ZOFRAN) 4 MG tablet Take 1 tablet (4 mg total) by mouth every 8 (eight) hours as needed for nausea or vomiting. 02/28/21 02/28/22  Pokhrel, Corrie Mckusick, MD  pantoprazole (PROTONIX) 40 MG tablet Take 40 mg by mouth daily.    [provider]  pregabalin (LYRICA) 50 MG capsule Take 50 mg by mouth 2 (two) times daily.    [provider]  REPATHA 140 MG/ML SOSY INJECT 1 ML UNDER THE SKIN EVERY 14 DAYS AS DIRECTED 05/11/21   Cantwell, Gerline Legacy, PA-C  Spacer/Aero-Holding Chambers (VORTEX VALVED HOLDING CHAMBER) DEVI by Does not apply route.    [provider]  varenicline (CHANTIX PAK) 0.5 MG X 11 & 1 MG X 42 tablet Take one 0.5 mg tablet by mouth once daily for 3 days, then increase to one 0.5 mg tablet twice daily for 4 days, then increase to one 1 mg tablet twice daily. 03/18/21   Maryjane Hurter, MD  varenicline (CHANTIX) 1 MG  tablet TAKE 1 TABLET(1 MG) BY MOUTH TWICE DAILY 03/18/21   Maryjane Hurter, MD      Allergies    Bactrim [sulfamethoxazole-trimethoprim] and Bee venom    Review of Systems   Review of Systems  Respiratory:  Negative for shortness of breath.   Cardiovascular:  Negative for chest pain.  Gastrointestinal:  Positive for abdominal pain.  All other systems reviewed and are negative.   Physical Exam Updated Vital Signs BP 99/81   Pulse 66   Temp 98 F (36.7 C) (Oral)   Resp 16   Ht 1.562 m (5' 1.5")   Wt 40 kg   SpO2 98%   BMI 16.39 kg/m  Physical Exam Vitals and nursing note reviewed.  Constitutional:      Appearance: She is well-developed. She is not ill-appearing.  HENT:      Head: Normocephalic and atraumatic.     Mouth/Throat:     Mouth: Mucous membranes are moist.  Eyes:     Pupils: Pupils are equal, round, and reactive to light.  Cardiovascular:     Rate and Rhythm: Normal rate and regular rhythm.     Heart sounds: Normal heart sounds.  Pulmonary:     Effort: Pulmonary effort is normal. No respiratory distress.     Breath sounds: No wheezing.  Abdominal:     General: Bowel sounds are normal.     Palpations: Abdomen is soft.     Tenderness: There is abdominal tenderness.     Comments: Abdominal bruit noted  Musculoskeletal:     Cervical back: Neck supple.  Skin:    General: Skin is warm and dry.  Neurological:     Mental Status: She is alert.     Comments: Oriented x2  Psychiatric:        Mood and Affect: Mood normal.     ED Results / Procedures / Treatments   Labs (all labs ordered are listed, but only abnormal results are displayed) Labs Reviewed  CBC WITH DIFFERENTIAL/PLATELET - Abnormal; Notable for the following components:      Result Value   RBC 2.63 (*)    Hemoglobin 8.5 (*)    HCT 25.6 (*)    All other components within normal limits  COMPREHENSIVE METABOLIC PANEL - Abnormal; Notable for the following components:   CO2 20 (*)    Glucose, Bld 122 (*)    Creatinine, Ser 1.09 (*)    Total Protein 6.4 (*)    GFR, Estimated 51 (*)    All other components within normal limits  POC OCCULT BLOOD, ED - Abnormal; Notable for the following components:   Fecal Occult Bld POSITIVE (*)    All other components within normal limits  TROPONIN I (HIGH SENSITIVITY) - Abnormal; Notable for the following components:   Troponin I (High Sensitivity) 36 (*)    All other components within normal limits  TROPONIN I (HIGH SENSITIVITY) - Abnormal; Notable for the following components:   Troponin I (High Sensitivity) 37 (*)    All other components within normal limits  LIPASE, BLOOD  URINALYSIS, ROUTINE W REFLEX MICROSCOPIC  HEMOGLOBIN AND  HEMATOCRIT, BLOOD    EKG EKG Interpretation  Date/Time:  Tuesday September 21 2021 02:13:10 EDT Ventricular Rate:  74 PR Interval:  208 QRS Duration: 86 QT Interval:  396 QTC Calculation: 439 R Axis:   55 Text Interpretation: Sinus rhythm with Premature atrial complexes Left ventricular hypertrophy with repolarization abnormality ( Sokolow-Lyon ) Abnormal ECG When compared with ECG  of 27-Feb-2021 20:10, PREVIOUS ECG IS PRESENT Confirmed by Thayer Jew [phone]) on 09/21/2021 4:04:05 AM  Radiology DG Abdomen Acute W/Chest  Result Date: 09/21/2021 CLINICAL DATA:  Chest and abdominal pain. EXAM: DG ABDOMEN ACUTE WITH 1 VIEW CHEST COMPARISON:  Chest radiograph dated 03/01/2011. FINDINGS: No focal consolidation, pleural effusion or pneumothorax. Mild cardiomegaly. Atherosclerotic calcification of the aorta. Large amount of stool throughout the colon. No bowel dilatation or evidence of obstruction. No free air or radiopaque calculi. Osteopenia with degenerative changes of the spine. No acute osseous pathology. Total right hip arthroplasty. IMPRESSION: 1. No acute cardiopulmonary process. 2. Constipation. No bowel obstruction. Electronically Signed   By: Anner Crete M.D.   On: 09/21/2021 02:59    Procedures Procedures    Medications Ordered in ED Medications  pantoprazole (PROTONIX) injection 40 mg (has no administration in time range)  iohexol (OMNIPAQUE) 300 MG/ML solution 80 mL (80 mLs Intravenous Contrast Given 09/21/21 0431)    ED Course/ Medical Decision Making/ A&P Clinical Course as of 09/21/21 6213  Tue Sep 21, 2021  0658 Patient had a stool that appeared black and tarry per nursing.  Have requested Hemoccult.  We will repeat hemoglobin.  Patient is hemodynamically stable. [CH]  0865 Chart reviewed.  Patient has an EGD from 2014 which indicated ulcer.  This was done in an outside hospital in [place]. [employer] Attempted to contact family multiple times including son-in-law and  daughter.  Unable to get in touch. [CH]    Clinical Course User Index [CH] Lianne Carreto, Barbette Hair, MD                           Medical Decision Making Amount and/or Complexity of Data Reviewed Labs: ordered. Radiology: ordered.  Risk Prescription drug management.   This patient presents to the ED for concern of abdominal/chest pain, this involves an extensive number of treatment options, and is a complaint that carries with it a high risk of complications and morbidity.  I considered the following differential and admission for this acute, potentially life threatening condition.  The differential diagnosis includes ACS, pneumonia, pneumothorax, gastritis, obstructive pathology, AAA  MDM:    This is an 82 year old female with history of dementia who presents with abdominal and chest pain.  History is somewhat limited secondary to dementia.  She is oriented x2.  She is currently without complaints.  Reportedly had chest and abdominal pain at home.  She is overall nontoxic-appearing and vital signs are reassuring.  She has no real tenderness on exam but does have an abdominal bruit as her only abdominal exam finding.  Labs obtained.  EKG shows no evidence of acute ischemia.  Troponin x2 in the 57s which is consistent with her priors.  Normal lipase.  No leukocytosis.  Given abdominal bruit, will obtain CT scan of the abdomen.  CT scan does not show any acute process.  She does have some constipation.  Awaiting urinalysis.  Overall work-up thus far is reassuring.  (Labs, imaging, consults)  Labs: I Ordered, and personally interpreted labs.  The pertinent results include: CBC, CMP, lipase, troponin  Imaging Studies ordered: I ordered imaging studies including chest x-ray, CT abdomen pelvis I independently visualized and interpreted imaging. I agree with the radiologist interpretation  Additional history obtained from chart review.  External records from outside source obtained and reviewed  including prior evaluations  Cardiac Monitoring: The patient was maintained on a cardiac monitor.  I personally viewed  and interpreted the cardiac monitored which showed an underlying rhythm of: Normal sinus rhythm  Reevaluation: After the interventions noted above, I reevaluated the patient and found that they have :stayed the same  Social Determinants of Health: History of dementia, lives with family  Disposition: Pending urine, repeat hemoglobin  Co morbidities that complicate the patient evaluation  Past Medical History:  Diagnosis Date   Anemia    Anxiety    Arthritis    B12 deficiency 05/2015   B12 was 184   Cancer ([place])    CHF (congestive heart failure) ([place])    COPD (chronic obstructive pulmonary disease) ([place])    Crohn's disease, small intestine ([place]) 02/19/2009   Depression 10/2013   chronic recurrent major depressive disorder   Dysrhythmia    H/O vitamin D deficiency 08/04/2008   Heart murmur    Hypertension    Hypothyroidism    Pneumonia    Recurrent dislocation of hip, right      Medicines Meds ordered this encounter  Medications   iohexol (OMNIPAQUE) 300 MG/ML solution 80 mL   pantoprazole (PROTONIX) injection 40 mg    I have reviewed the patients home medicines and have made adjustments as needed  Problem List / ED Course: Problem List Items Addressed This Visit   None Visit Diagnoses     Epigastric pain    -  Primary                   Final Clinical Impression(s) / ED Diagnoses Final diagnoses:  Epigastric pain    Rx / DC Orders ED Discharge Orders     None         Pami Wool, Barbette Hair, MD 09/21/21 5038    Merryl Hacker, MD 09/21/21 0710    Merryl Hacker, MD 09/21/21 [phone]

## 2021-09-21 NOTE — ED Notes (Signed)
Pt ambulatory to and from restroom with steady gait with 1 stand by assist

## 2021-09-21 NOTE — Hospital Course (Addendum)
Ms Kelly [name] is a 81yo F with a PMH of pAfib on chronic anticoagulation with eliquis, HTN, HLD, chronic back pain, COPD, ongoing tobacco use disorder, and moderate MR who presents with acute worsening abdominal pain.   Patient states that the day prior to admission she was awoken out of her sleep with abdominal pain. She states that the pain starts at her back and goes to the front, and is primarily concentrated in the LUQ. She states that the pain has been ongoing for several years and she has undergone multiple extensive work ups that have been unrevealing and usually resolves with her lyrica. She does not recall specific precipitating factors as the pain is not necessarily provoked by activity or food ingestion. Because her pain was persistent she decided to present. She states she has had a poor appetite (But also does not eat much as she is missing several teeth and it is difficult for her to chew), no vomiting or diarrhea, melena, or hematochezia, or hematemesis. She has had some constipation for the past week, but did have a small bowel movement the day prior to admission which did not help relieve her pain.    PMH Started yesterday morning. Woke up patient out of her sleep.   LUQ. Starts at back and goes to front.   Unclear if pain has been going on for a while or not. (Several years). Multiple tests.   Pain comes and goes.   Lyrica usually works but did not work this time.  Didn't eat all day. NO appetite. No vomiting or diarrhea.  Very small bowel movement that did not help the pain.   Lost a lot of weight missing a lot of teeth, difficult to chew. Able to eat soft foods.  No melena. At home.    Able to perform adls without.  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 Form the left side. She take lyrica. No fevers, sweating, or any shakes. Patient has not been eating.

## 2021-09-22 ENCOUNTER — Encounter (HOSPITAL_COMMUNITY)
Admission: EM | Disposition: A | Discharge status: Home / Self Care | Payer: Self-pay | Source: Home / Self Care | Attending: Emergency Medicine

## 2021-09-22 ENCOUNTER — Encounter (HOSPITAL_COMMUNITY): Payer: Self-pay | Admitting: Internal Medicine

## 2021-09-22 ENCOUNTER — Observation Stay (HOSPITAL_COMMUNITY): Payer: Medicare Other | Admitting: Certified Registered"

## 2021-09-22 ENCOUNTER — Observation Stay (HOSPITAL_BASED_OUTPATIENT_CLINIC_OR_DEPARTMENT_OTHER): Payer: Medicare Other | Admitting: Certified Registered"

## 2021-09-22 DIAGNOSIS — K921 Melena: Secondary | ICD-10-CM

## 2021-09-22 DIAGNOSIS — R1013 Epigastric pain: Secondary | ICD-10-CM | POA: Diagnosis not present

## 2021-09-22 DIAGNOSIS — K2961 Other gastritis with bleeding: Secondary | ICD-10-CM | POA: Diagnosis not present

## 2021-09-22 DIAGNOSIS — J449 Chronic obstructive pulmonary disease, unspecified: Secondary | ICD-10-CM | POA: Diagnosis not present

## 2021-09-22 DIAGNOSIS — K922 Gastrointestinal hemorrhage, unspecified: Secondary | ICD-10-CM | POA: Diagnosis not present

## 2021-09-22 DIAGNOSIS — D62 Acute posthemorrhagic anemia: Secondary | ICD-10-CM | POA: Diagnosis not present

## 2021-09-22 DIAGNOSIS — I5033 Acute on chronic diastolic (congestive) heart failure: Secondary | ICD-10-CM | POA: Diagnosis not present

## 2021-09-22 DIAGNOSIS — K2970 Gastritis, unspecified, without bleeding: Secondary | ICD-10-CM

## 2021-09-22 DIAGNOSIS — F17210 Nicotine dependence, cigarettes, uncomplicated: Secondary | ICD-10-CM

## 2021-09-22 DIAGNOSIS — I110 Hypertensive heart disease with heart failure: Secondary | ICD-10-CM

## 2021-09-22 HISTORY — PX: ESOPHAGOGASTRODUODENOSCOPY (EGD) WITH PROPOFOL: SHX5813

## 2021-09-22 HISTORY — PX: BIOPSY: SHX5522

## 2021-09-22 LAB — CBC
HCT: 23.3 % — ABNORMAL LOW (ref 36.0–46.0)
HCT: 25.7 % — ABNORMAL LOW (ref 36.0–46.0)
Hemoglobin: 7.6 g/dL — ABNORMAL LOW (ref 12.0–15.0)
Hemoglobin: 8.4 g/dL — ABNORMAL LOW (ref 12.0–15.0)
MCH: 31.8 pg (ref 26.0–34.0)
MCH: 31.5 pg (ref 26.0–34.0)
MCHC: 32.6 g/dL (ref 30.0–36.0)
MCHC: 32.7 g/dL (ref 30.0–36.0)
MCV: 97.5 fL (ref 80.0–100.0)
MCV: 96.3 fL (ref 80.0–100.0)
Platelets: 179 10*3/uL (ref 150–400)
Platelets: 195 10*3/uL (ref 150–400)
RBC: 2.39 MIL/uL — ABNORMAL LOW (ref 3.87–5.11)
RBC: 2.67 MIL/uL — ABNORMAL LOW (ref 3.87–5.11)
RDW: 14.9 % (ref 11.5–15.5)
RDW: 15.2 % (ref 11.5–15.5)
WBC: 4.2 10*3/uL (ref 4.0–10.5)
WBC: 4.9 10*3/uL (ref 4.0–10.5)
nRBC: 0 % (ref 0.0–0.2)
nRBC: 0 % (ref 0.0–0.2)

## 2021-09-22 LAB — BASIC METABOLIC PANEL
Anion gap: 11 (ref 5–15)
BUN: 17 mg/dL (ref 8–23)
CO2: 20 mmol/L — ABNORMAL LOW (ref 22–32)
Calcium: 9 mg/dL (ref 8.9–10.3)
Chloride: 108 mmol/L (ref 98–111)
Creatinine, Ser: 1.05 mg/dL — ABNORMAL HIGH (ref 0.44–1.00)
GFR, Estimated: 53 mL/min — ABNORMAL LOW (ref >60)
Glucose, Bld: 145 mg/dL — ABNORMAL HIGH (ref 70–99)
Potassium: 3.6 mmol/L (ref 3.5–5.1)
Sodium: 139 mmol/L (ref 135–145)

## 2021-09-22 SURGERY — ESOPHAGOGASTRODUODENOSCOPY (EGD) WITH PROPOFOL
Anesthesia: Monitor Anesthesia Care

## 2021-09-22 MED ORDER — SODIUM CHLORIDE 0.9 % IV BOLUS
1000.0000 mL | Freq: Once | INTRAVENOUS | Status: AC
  Administered 2021-09-22: 1000 mL via INTRAVENOUS

## 2021-09-22 MED ORDER — PROPOFOL 10 MG/ML IV BOLUS
INTRAVENOUS | Status: DC | PRN
  Administered 2021-09-22: 10 mg via INTRAVENOUS

## 2021-09-22 MED ORDER — PROPOFOL 500 MG/50ML IV EMUL
INTRAVENOUS | Status: DC | PRN
  Administered 2021-09-22: 40 ug/kg/min via INTRAVENOUS

## 2021-09-22 MED ORDER — SODIUM CHLORIDE 0.9 % IV SOLN: INTRAVENOUS | Status: DC | PRN

## 2021-09-22 MED ORDER — ENSURE ENLIVE PO LIQD
237.0000 mL | Freq: Two times a day (BID) | ORAL | Status: DC
  Administered 2021-09-23 (×2): 237 mL via ORAL

## 2021-09-22 MED ORDER — ACETAMINOPHEN 500 MG PO TABS
1000.0000 mg | ORAL_TABLET | Freq: Three times a day (TID) | ORAL | Status: DC
  Administered 2021-09-22 (×2): 1000 mg via ORAL
  Filled 2021-09-22 (×3): qty 2

## 2021-09-22 MED ORDER — LIDOCAINE 2% (20 MG/ML) 5 ML SYRINGE
INTRAMUSCULAR | Status: DC | PRN
  Administered 2021-09-22: 40 mg via INTRAVENOUS

## 2021-09-22 SURGICAL SUPPLY — 15 items

## 2021-09-22 NOTE — Care Management Obs Status (Cosign Needed)
[facility] NOTIFICATION   Patient Details  Name: Kelly [name] MRN: [id number] Date of Birth: 05/05/1940   Medicare Observation Status Notification Given:  Yes    Curlene Labrum, RN 09/22/2021, 11:06 AM

## 2021-09-22 NOTE — TOC Initial Note (Signed)
Transition of Care [facility]) - Initial/Assessment Note    Patient Details  Name: Kelly [name] MRN: [id number] Date of Birth: 1940-08-23  [facility] CM/SW Contact:    Curlene Labrum, RN Phone Number: 09/22/2021, 3:27 PM  Clinical Narrative:                 CM met with the patient in the hospital room to discuss transitions of care and complete CAGE assessment.  The patient states that she lives with her daughter and son in law in the home and has 24 hour supervision in the home by her family.  The patient states that she has not smoked  cigarettes in the past 5 weeks and plans to remain without use of tobacco products.  CAGE assessment completed.  The patient states that he usually drinks about 1-2 cups of Wine per day but has not had any alcohol in "a while".  The patient states that her family does not approve of her drinking alcohol but that she plans on continuing to drink wine outside of their requests.  I presented the patient with Outpatient Substance abuse counseling but the patient at this time plans to continue her current use of alcohol at home.  PCP - Dr. Randol Kern  Family to provide transportation home once she is medically stable for discharge.  The patient was given Medicare Observation letter and Code 44 notification that her Inpatient Stay was changed to Observation status.  I called the patient's daughter this morning and updated her since the patient has memory issues.  Documents were left with the patient's belongings at the daughter's request.  Expected Discharge Plan: Home/Self Care Barriers to Discharge: Continued Medical Work up   Patient Goals and CMS Choice Patient states their goals for this hospitalization and ongoing recovery are:: To get better and go home. CMS Medicare.gov Compare Post Acute Care list provided to:: Patient Choice offered to / list presented to : Patient  Expected Discharge Plan and Services Expected Discharge Plan:  Home/Self Care   Discharge Planning Services: CM Consult Post Acute Care Choice: Resumption of Svcs/PTA Provider Living arrangements for the past 2 months: Single Family Home                                      Prior Living Arrangements/Services Living arrangements for the past 2 months: Single Family Home Lives with:: Adult Children Patient language and need for interpreter reviewed:: Yes Do you feel safe going back to the place where you live?: Yes      Need for Family Participation in Patient Care: Yes (Comment) Care giver support system in place?: Yes (comment)   Criminal Activity/Legal Involvement Pertinent to Current Situation/Hospitalization: No - Comment as needed  Activities of Daily Living Home Assistive Devices/Equipment: None ADL Screening (condition at time of admission) Patient's cognitive ability adequate to safely complete daily activities?: Yes Is the patient deaf or have difficulty hearing?: No Does the patient have difficulty seeing, even when wearing glasses/contacts?: No Does the patient have difficulty concentrating, remembering, or making decisions?: No Patient able to express need for assistance with ADLs?: Yes Does the patient have difficulty dressing or bathing?: No Independently performs ADLs?: No Communication: Independent Does the patient have difficulty walking or climbing stairs?: No Weakness of Legs: None Weakness of Arms/Hands: None  Permission Sought/Granted Permission sought to share information with : Case Manager, Family Supports Permission granted  to share information with : Yes, Verbal Permission Granted        Permission granted to share info w Relationship: Daughter - Virgilio Frees, daughter - [phone]     Emotional Assessment Appearance:: Appears stated age Attitude/Demeanor/Rapport: Gracious Affect (typically observed): Accepting Orientation: : Oriented to Self, Oriented to Place, Oriented to  Time, Oriented to  Situation Alcohol / Substance Use: Alcohol Use, Tobacco Use Psych Involvement: No (comment)  Admission diagnosis:  Melena [K92.1] Epigastric pain [R10.13] GI bleed [K92.2] Patient Active Problem List   Diagnosis Date Noted   Epigastric pain    Acute blood loss anemia    GI bleed 09/21/2021   Troponin level elevated 02/28/2021   AKI (acute kidney injury) ([place]) 02/28/2021   Dehydration 02/28/2021   GERD (gastroesophageal reflux disease) 02/28/2021   Acute on chronic diastolic CHF (congestive heart failure) ([place]) 02/19/2021   Dyspnea 02/18/2021   Hydronephrosis 02/17/2021   Acute respiratory failure with hypoxia ([place]) 09/13/2020   COPD with acute exacerbation ([place]) 09/13/2020   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   Asymptomatic bilateral carotid artery stenosis 08/13/2020   Paroxysmal atrial fibrillation ([place]) 08/13/2020   Mixed hyperlipidemia 04/23/2020   Primary hypertension 04/23/2020   Severe concentric left ventricular hypertrophy 09/26/2016   Hypothyroidism, unspecified 09/26/2016   B12 deficiency 08/15/2016   Anemia, iron deficiency 08/15/2016   Bilateral lower extremity edema 08/02/2016   Chronic pain disorder 49/67/5916   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 [place] [address] Phone: [phone] Fax: [place], [place] [place] [address] Phone: [phone] Fax: [phone]  [id number] - [place], [place] - [address] [place]  [address] Phone: [phone] Fax: [phone]     Social Determinants of Health ([place]) Interventions    Readmission Risk Interventions    09/22/2021    3:27 PM  Readmission Risk Prevention Plan  Transportation Screening Complete  PCP or Specialist Appt within 5-7 Days Complete  Home Care Screening Complete  Medication Review (RN CM) Complete

## 2021-09-22 NOTE — Progress Notes (Signed)
NAME:  Kelly [name], MRN:  [id number], DOB:  25-Aug-1940, LOS: 1 ADMISSION DATE:  09/21/2021  Subjective  Patient evaluated at bedside this AM. She notes having difficulty sleeping because of worrying about the procedure today. Notes feeling abdominal pain on the left and right lumber regions. Years ago she fell and landed on her back, and it radiated to the front.  Patient has not been eating. Denies having nausea, vomiting, and melena at home. Denies fevers, chills, and shakes.   Objective   Blood pressure (!) 118/43, pulse 77, temperature 97.8 F (36.6 C), temperature source Oral, resp. rate 17, height 5' 1.5" (1.562 m), weight 40 kg, SpO2 96 %.    No intake or output data in the 24 hours ending 09/22/21 1047 Filed Weights   09/21/21 0300  Weight: 40 kg   General: Pleasant in bed. No acute distress. CV: RRR. No murmurs, rubs, or gallops. No LE edema Pulmonary: Lungs CTAB. Normal effort. No wheezing or rales. Abdominal: Soft, nondistended. Tenderness in the right and left lumbar regions. Skin: Warm and dry. No obvious rash or lesions. Neuro: A&Ox3. Moves all extremities. Normal sensation. No focal deficit. Psych: Normal mood and affect   Labs       Latest Ref Rng & Units 09/22/2021   12:18 AM 09/21/2021    5:50 PM 09/21/2021    7:14 AM  CBC  WBC 4.0 - 10.5 K/uL 4.2  5.1    Hemoglobin 12.0 - 15.0 g/dL 7.6  7.4  8.2   Hematocrit 36.0 - 46.0 % 23.3  23.6  25.6   Platelets 150 - 400 K/uL 179  209        Latest Ref Rng & Units 09/22/2021   12:18 AM 09/21/2021    2:20 AM 02/28/2021    5:10 AM  BMP  Glucose 70 - 99 mg/dL 145  122  96   BUN 8 - 23 mg/dL '17  19  26   '$ Creatinine 0.44 - 1.00 mg/dL 1.05  1.09  1.18   Sodium 135 - 145 mmol/L 139  142  135   Potassium 3.5 - 5.1 mmol/L 3.6  4.1  3.6   Chloride 98 - 111 mmol/L 108  109  99   CO2 22 - 32 mmol/L '20  20  28   '$ Calcium 8.9 - 10.3 mg/dL 9.0  9.5  8.3     Summary  Ms Makinna Andy is a 81yo F with a PMH of duodenal ulcer,  alcohol use, pAfib on chronic anticoagulation with eliquis, and tobacco use disorder who presents with acute on chronic abdominal pain and ultimately had a melenic stool c/f UGIB. Will evaluate EGD today.  Assessment & Plan:  Principal Problem:   GI bleed   #GI Bleed #H/o perforated duodenal ulcer #Normocytic anemia  Patient presents with acute worsening of her chronic abdominal pain that is persistent. No acute events overnight. Patient evaluated this morning and pain was consistent. Denies blood in stools. She is currently HDS. Current Hgb dropped from 8.2 to 7.4, repeat up to 7.6. No signs of active bleed. Iron panel shows iron 44, saturation 14%, ferritin 69.  -GI will perform EGD today, will follow further GI recs -Pain control with scheduled Tylenol 1000 mg TID -Continue IV PPI BID -Trend CBCs   #HTN Not on any medications at home. BP dropped to 92/54 this morning, pulse was 77. Most likely due to hypovolemia in the setting of decreased PO intake. Gave 1L NS bolus. Repeat and  current BP improved to 118/43. Continue to monitor post EGD. -Encourage PO intake  #pAfib Rate controlled. On eliquis, digoxin and metoprolol at home for this. We are holding eliquis in the setting of possible GI bleed. HR in the 70s. 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 She was to continue statin therapy as well.  -Continue rosuvastatin 20 mg daily   #Crohn's dz  -Not currently on any medications for this     Dispo: Admit patient to Inpatient with expected length of stay greater than 2 midnights.   Best practice:  CODE: DNR  Stormy Fabian, MD Internal Medicine Resident PGY-1 PAGER: [phone] 09/22/2021 10:47 AM  If after hours (below), please contact on-call pager: [phone] 5PM-7AM Monday-Friday 1PM-7AM Saturday-Sunday

## 2021-09-22 NOTE — Care Management CC44 (Cosign Needed)
Condition Code 44 Documentation Completed  Patient Details  Name: Kelly [name] MRN: [id number] Date of Birth: 1940/10/27   Condition Code 44 given:  Yes Patient signature on Condition Code 44 notice:  Yes Documentation of 2 MD's agreement:  Yes Code 44 added to claim:  Yes    Curlene Labrum, RN 09/22/2021, 11:06 AM

## 2021-09-22 NOTE — Anesthesia Preprocedure Evaluation (Signed)
Anesthesia Evaluation  Patient identified by MRN, date of birth, ID band Patient awake    Reviewed: Allergy & Precautions, NPO status , Patient's Chart, lab work & pertinent test results  History of Anesthesia Complications Negative for: history of anesthetic complications  Airway Mallampati: II  TM Distance: >3 FB Neck ROM: Full    Dental  (+) Missing, Poor Dentition   Pulmonary COPD, Current Smoker and Patient abstained from smoking.,    Pulmonary exam normal        Cardiovascular hypertension, +CHF  Normal cardiovascular exam+ dysrhythmias Atrial Fibrillation   Echo 07/14/21: EF 57%, severe LVH, trace AR, chordal SAM with LVOT gradient at least 89 mmHg- 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, mod MR, mild TR, PASP 41   Neuro/Psych Bilateral carotid stenosis (RICA 05-69%, LICA >=79%)    GI/Hepatic Neg liver ROS, PUD, GERD  ,Crohn's disease   Endo/Other  Hypothyroidism   Renal/GU negative Renal ROS  negative genitourinary   Musculoskeletal negative musculoskeletal ROS (+)   Abdominal   Peds  Hematology  (+) Blood dyscrasia, anemia ,   Anesthesia Other Findings   Reproductive/Obstetrics                             Anesthesia Physical Anesthesia Plan  ASA: 4  Anesthesia Plan: MAC   Post-op Pain Management: Minimal or no pain anticipated   Induction: Intravenous  PONV Risk Score and Plan: 2 and Propofol infusion, TIVA and Treatment may vary due to age or medical condition  Airway Management Planned: Natural Airway, Nasal Cannula and Simple Face Mask  Additional Equipment: None  Intra-op Plan:   Post-operative Plan:   Informed Consent: I have reviewed the patients History and Physical, chart, labs and discussed the procedure including the risks, benefits and alternatives for the proposed anesthesia with the patient or authorized representative who has indicated his/her  understanding and acceptance.       Plan Discussed with:   Anesthesia Plan Comments:         Anesthesia Quick Evaluation

## 2021-09-22 NOTE — Progress Notes (Signed)
Mobility Specialist Progress Note:   09/22/21 1543  Mobility  Activity Ambulated with assistance in hallway  Level of Assistance Contact guard assist, steadying assist  Assistive Device Other (Comment) (HHA)  Distance Ambulated (ft) 120 ft  Activity Response Tolerated well  $Mobility charge 1 Mobility   Pt received sitting EOB and agreeable. No complaints. During ambulation, asked for her hand to be held for support as she "struggles to see d/t cataracts". Pt left sitting EOB with all needs met and call bell in reach.   Kelly [name] Mobility Specialist-Acute Rehab Secure Chat only

## 2021-09-22 NOTE — Anesthesia Procedure Notes (Signed)
Procedure Name: MAC Date/Time: 09/22/2021 11:30 AM  Performed by: Amadeo Garnet, CRNAPre-anesthesia Checklist: Patient identified, Emergency Drugs available, Suction available and Patient being monitored Patient Re-evaluated:Patient Re-evaluated prior to induction Oxygen Delivery Method: Nasal cannula Preoxygenation: Pre-oxygenation with 100% oxygen Induction Type: IV induction Placement Confirmation: positive ETCO2 Dental Injury: Teeth and Oropharynx as per pre-operative assessment

## 2021-09-22 NOTE — Transfer of Care (Signed)
Immediate Anesthesia Transfer of Care Note  Patient: Kelly [name]  Procedure(s) Performed: ESOPHAGOGASTRODUODENOSCOPY (EGD) WITH PROPOFOL BIOPSY  Patient Location: Endoscopy Unit  Anesthesia Type:MAC  Level of Consciousness: awake, alert  and oriented  Airway & Oxygen Therapy: Patient Spontanous Breathing and Patient connected to nasal cannula oxygen  Post-op Assessment: Report given to RN, Post -op Vital signs reviewed and stable and Patient moving all extremities  Post vital signs: Reviewed and stable  Last Vitals:  Vitals Value Taken Time  BP    Temp    Pulse 70 09/22/21 1143  Resp 20 09/22/21 1143  SpO2 98 % 09/22/21 1143  Vitals shown include unvalidated device data.  Last Pain:  Vitals:   09/22/21 1048  TempSrc: Temporal  PainSc: 6       Patients Stated Pain Goal: 0 (29/79/89 2119)  Complications: No notable events documented.

## 2021-09-22 NOTE — Op Note (Signed)
[facility] Patient Name: Kelly [name] Procedure Date : 09/22/2021 MRN: [id number] Attending MD: Mauri Pole , MD Date of Birth: August 13, 1940 CSN: [id number] Age: 81 Admit Type: Inpatient Procedure:                Upper GI endoscopy Indications:              Recent gastrointestinal bleeding Providers:                Mauri Pole, MD, Darliss Cheney, Technician,                            Dulcy Fanny Referring MD:              Medicines:                Monitored Anesthesia Care Complications:            No immediate complications. Estimated Blood Loss:     Estimated blood loss was minimal. Procedure:                Pre-Anesthesia Assessment:                           - Prior to the procedure, a History and Physical                            was performed, and patient medications and                            allergies were reviewed. The patient's tolerance of                            previous anesthesia was also reviewed. The risks                            and benefits of the procedure and the sedation                            options and risks were discussed with the patient.                            All questions were answered, and informed consent                            was obtained. Prior Anticoagulants: The patient                            last took Eliquis (apixaban) 2 days prior to the                            procedure. ASA Grade Assessment: III - A patient                            with severe systemic disease. After reviewing the  risks and benefits, the patient was deemed in                            satisfactory condition to undergo the procedure.                           After obtaining informed consent, the endoscope was                            passed under direct vision. Throughout the                            procedure, the patient's blood pressure, pulse, and                             oxygen saturations were monitored continuously. The                            [id number]-H190 ([id number]) Olympus endoscope was introduced                            through the mouth, and advanced to the second part                            of duodenum. The upper GI endoscopy was                            accomplished without difficulty. The patient                            tolerated the procedure well. Scope In: Scope Out: Findings:      The Z-line was regular and was found 38 cm from the incisors.      No gross lesions were noted in the entire esophagus.      A small hiatal hernia was present.      Patchy moderate inflammation with hemorrhage characterized by congestion       (edema), erosions, erythema and linear erosions was found in the gastric       antrum and in the prepyloric region of the stomach. Biopsies were taken       with a cold forceps for Helicobacter pylori testing.      The cardia and gastric fundus were normal on retroflexion.      The examined duodenum was normal. Impression:               - Z-line regular, 38 cm from the incisors.                           - No gross lesions in esophagus.                           - Small hiatal hernia.                           - Erosive gastritis with hemorrhage. Biopsied.                           -  Normal examined duodenum. Recommendation:           - Patient has a contact number available for                            emergencies. The signs and symptoms of potential                            delayed complications were discussed with the                            patient. Return to normal activities tomorrow.                            Written discharge instructions were provided to the                            patient.                           - Resume previous diet.                           - Continue present medications.                           - Await pathology results.                           - Use Protonix  (pantoprazole) 40 mg PO BID. Procedure Code(s):        --- Professional ---                           [phone], Esophagogastroduodenoscopy, flexible,                            transoral; with biopsy, single or multiple Diagnosis Code(s):        --- Professional ---                           K44.9, Diaphragmatic hernia without obstruction or                            gangrene                           K29.61, Other gastritis with bleeding                           K92.2, Gastrointestinal hemorrhage, unspecified CPT copyright 2019 American Medical Association. All rights reserved. The codes documented in this report are preliminary and upon coder review may  be revised to meet current compliance requirements. Mauri Pole, MD 09/22/2021 11:50:37 AM This report has been signed electronically. Number of Addenda: 0

## 2021-09-22 NOTE — Interval H&P Note (Signed)
History and Physical Interval Note:  09/22/2021 11:06 AM  Kelly [name]  has presented today for surgery, with the diagnosis of anemia.  FOBT positive.  History of perforated duodenal ulcer requiring surgery a few years back..  The various methods of treatment have been discussed with the patient and family. After consideration of risks, benefits and other options for treatment, the patient has consented to  Procedure(s): ESOPHAGOGASTRODUODENOSCOPY (EGD) WITH PROPOFOL (N/A) as a surgical intervention.  The patient's history has been reviewed, patient examined, no change in status, stable for surgery.  I have reviewed the patient's chart and labs.  Questions were answered to the patient's satisfaction.     Dhanvi Boesen

## 2021-09-22 NOTE — Progress Notes (Signed)
Initial Nutrition Assessment  DOCUMENTATION CODES:   Underweight  INTERVENTION:   Ensure Enlive po BID, each supplement provides 350 kcal and 20 grams of protein. Multivitamin w/ minerals daily Encourage good PO intake Chopped meats  NUTRITION DIAGNOSIS:   Inadequate oral intake related to decreased appetite, other (see comment) (dentition) as evidenced by per patient/family report.  GOAL:   Patient will meet greater than or equal to 90% of their needs  MONITOR:   PO intake, Supplement acceptance, Weight trends  REASON FOR ASSESSMENT:   Consult Assessment of nutrition requirement/status  ASSESSMENT:   81 y.o. female presented to the ED with increased abdominal pain. PMH includes COPD, A. Fib, GERD, CHF, and HTN. Pt admitted with possible GI bleed.   9/06 - EGD  Pt laying in bed at time of RD visit. Reports that her appetite has been ok, but not great. States that she does not eat regular at home because no one likes to eat the same things or at the same time. Pt also shares that she is missing multiple teeth and her dentures are at the dentist being repaired. Shares that she drinks 2 Ensures per day at home; RD encouraged pt to continue with that. Reports that her daughter or son-in-law will buy food from the grocery store or a restaurant and bring home to her.  Pt reports a UBW of 105# and is currently 91#. States that she knew she had lost weight because of not eating as well because of her teeth and depression. States that she is trying to gain weight.  Discussed nutritional supplements with pt, pt agreeable to resume as she does at home.   Medications reviewed and include: Folic acid, MVI, Protonix, Thiamine Labs reviewed.  NUTRITION - FOCUSED PHYSICAL EXAM:  Deferred to follow-up.   Diet Order:   Diet Order             Diet regular Room service appropriate? Yes; Fluid consistency: Thin  Diet effective now                   EDUCATION NEEDS:   No  education needs have been identified at this time  Skin:  Skin Assessment: Reviewed RN Assessment  Last BM:  9/5  Height:   Ht Readings from Last 1 Encounters:  09/22/21 5' 1.5" (1.562 m)    Weight:   Wt Readings from Last 1 Encounters:  09/22/21 41.3 kg    Ideal Body Weight:  50 kg  BMI:  Body mass index is 16.92 kg/m.  Estimated Nutritional Needs:   Kcal:  1400-1600  Protein:  70-85 grams  Fluid:  >/= 1.5 L    Hermina Barters RD, LDN Clinical Dietitian See [facility] for contact information.

## 2021-09-23 ENCOUNTER — Encounter: Payer: Self-pay | Admitting: Psychiatry

## 2021-09-23 ENCOUNTER — Other Ambulatory Visit (HOSPITAL_COMMUNITY): Payer: Self-pay

## 2021-09-23 ENCOUNTER — Encounter (HOSPITAL_COMMUNITY): Payer: Self-pay | Admitting: Internal Medicine

## 2021-09-23 DIAGNOSIS — R1013 Epigastric pain: Secondary | ICD-10-CM | POA: Diagnosis not present

## 2021-09-23 DIAGNOSIS — D62 Acute posthemorrhagic anemia: Secondary | ICD-10-CM | POA: Diagnosis not present

## 2021-09-23 DIAGNOSIS — K2931 Chronic superficial gastritis with bleeding: Secondary | ICD-10-CM

## 2021-09-23 DIAGNOSIS — K921 Melena: Secondary | ICD-10-CM

## 2021-09-23 DIAGNOSIS — K2961 Other gastritis with bleeding: Secondary | ICD-10-CM | POA: Diagnosis not present

## 2021-09-23 DIAGNOSIS — K922 Gastrointestinal hemorrhage, unspecified: Secondary | ICD-10-CM | POA: Diagnosis not present

## 2021-09-23 LAB — CBC
HCT: 22.5 % — ABNORMAL LOW (ref 36.0–46.0)
Hemoglobin: 7.5 g/dL — ABNORMAL LOW (ref 12.0–15.0)
MCH: 32.1 pg (ref 26.0–34.0)
MCHC: 33.3 g/dL (ref 30.0–36.0)
MCV: 96.2 fL (ref 80.0–100.0)
Platelets: 160 10*3/uL (ref 150–400)
RBC: 2.34 MIL/uL — ABNORMAL LOW (ref 3.87–5.11)
RDW: 15.1 % (ref 11.5–15.5)
WBC: 3.8 10*3/uL — ABNORMAL LOW (ref 4.0–10.5)
nRBC: 0 % (ref 0.0–0.2)

## 2021-09-23 LAB — BASIC METABOLIC PANEL
Anion gap: 8 (ref 5–15)
BUN: 15 mg/dL (ref 8–23)
CO2: 22 mmol/L (ref 22–32)
Calcium: 9.1 mg/dL (ref 8.9–10.3)
Chloride: 110 mmol/L (ref 98–111)
Creatinine, Ser: 1.25 mg/dL — ABNORMAL HIGH (ref 0.44–1.00)
GFR, Estimated: 43 mL/min — ABNORMAL LOW (ref >60)
Glucose, Bld: 97 mg/dL (ref 70–99)
Potassium: 3.8 mmol/L (ref 3.5–5.1)
Sodium: 140 mmol/L (ref 135–145)

## 2021-09-23 LAB — SURGICAL PATHOLOGY

## 2021-09-23 MED ORDER — METOPROLOL SUCCINATE ER 25 MG PO TB24
25.0000 mg | ORAL_TABLET | Freq: Every day | ORAL | 0 refills | Status: DC
  Filled 2021-09-23: qty 30, 30d supply, fill #0

## 2021-09-23 MED ORDER — VITAMIN B-12 1000 MCG PO TABS
1000.0000 ug | ORAL_TABLET | Freq: Every day | ORAL | Status: DC
  Administered 2021-09-23: 1000 ug via ORAL
  Filled 2021-09-23: qty 1

## 2021-09-23 MED ORDER — PANTOPRAZOLE SODIUM 40 MG PO TBEC
40.0000 mg | DELAYED_RELEASE_TABLET | Freq: Two times a day (BID) | ORAL | 0 refills | Status: DC
  Filled 2021-09-23: qty 180, 90d supply, fill #0

## 2021-09-23 MED ORDER — VITAMIN B 12 500 MCG PO TABS
500.0000 ug | ORAL_TABLET | Freq: Every day | ORAL | 0 refills | Status: AC
  Filled 2021-09-23: qty 30, 30d supply, fill #0

## 2021-09-23 MED ORDER — PANTOPRAZOLE SODIUM 40 MG PO TBEC
40.0000 mg | DELAYED_RELEASE_TABLET | Freq: Two times a day (BID) | ORAL | Status: DC
  Administered 2021-09-23: 40 mg via ORAL
  Filled 2021-09-23: qty 1

## 2021-09-23 NOTE — Anesthesia Postprocedure Evaluation (Signed)
Anesthesia Post Note  Patient: Shay Jhaveri  Procedure(s) Performed: ESOPHAGOGASTRODUODENOSCOPY (EGD) WITH PROPOFOL BIOPSY     Patient location during evaluation: Endoscopy Anesthesia Type: MAC Level of consciousness: awake and alert Pain management: pain level controlled Vital Signs Assessment: post-procedure vital signs reviewed and stable Respiratory status: spontaneous breathing, nonlabored ventilation and respiratory function stable Cardiovascular status: blood pressure returned to baseline and stable Postop Assessment: no apparent nausea or vomiting Anesthetic complications: no   No notable events documented.  Last Vitals:  Vitals:   09/23/21 0735 09/23/21 0856  BP: (!) 101/57   Pulse: 82   Resp: 14   Temp: 37.2 C   SpO2: 96% 97%    Last Pain:  Vitals:   09/23/21 0831  TempSrc:   PainSc: 0-No pain                 Lidia Collum

## 2021-09-23 NOTE — Progress Notes (Signed)
Mobility Specialist Criteria Algorithm Info.   09/23/21 0900  Mobility  Activity Ambulated with assistance in hallway;Dangled on edge of bed  Range of Motion/Exercises Active;All extremities  Level of Assistance Standby assist, set-up cues, supervision of patient - no hands on  Assistive Device None  Distance Ambulated (ft) 380 ft  Activity Response Tolerated well   Patient received dangling EOB eager to participate in mobility. Ambulated supervision level with  slow steady gait. Patient has limitations being visually impaired but was able to navigate without physical assistance or cues for safety. Returned to room without complaint or incident. Was left dangling EOB with all needs met, call bell in reach.   [place] Giovanne Nickolson, [place], [place]  EFUWT:[phone] Office: [phone]

## 2021-09-23 NOTE — Plan of Care (Deleted)
This is the 81 y.o. yo female here with Melena [K92.1] Epigastric pain [R10.13] GI bleed [K92.2]  who was admitted on 09/21/2021   Major Overnight Events NAEON  Objective Vitals:  Vitals:   09/22/21 1603 09/22/21 2219 09/22/21 2221 09/23/21 0458  BP: (!) 93/45 (!) 136/54 (!) 118/57 (!) 111/59  Pulse: 64 76 71 73  Resp: '18 18 18 18  '$ Temp: 97.7 F (36.5 C) 97.8 F (36.6 C)  97.6 F (36.4 C)  TempSrc: Oral Oral  Oral  SpO2: 97% 97% 97% 97%  Weight:      Height:        Intake/[place]: I/O last 3 completed shifts: In: 64 [P.O.:480; I.V.:300] Out: -  No intake/output data recorded. Labs:     Latest Ref Rng & Units 09/23/2021    4:56 AM 09/22/2021    6:13 PM 09/22/2021   12:18 AM  CBC  WBC 4.0 - 10.5 K/uL 3.8  4.9  4.2   Hemoglobin 12.0 - 15.0 g/dL 7.5  8.4  7.6   Hematocrit 36.0 - 46.0 % 22.5  25.7  23.3   Platelets 150 - 400 K/uL 160  195  179        Latest Ref Rng & Units 09/23/2021    4:56 AM 09/22/2021   12:18 AM 09/21/2021    2:20 AM  CMP  Glucose 70 - 99 mg/dL 97  145  122   BUN 8 - 23 mg/dL '15  17  19   '$ Creatinine 0.44 - 1.00 mg/dL 1.25  1.05  1.09   Sodium 135 - 145 mmol/L 140  139  142   Potassium 3.5 - 5.1 mmol/L 3.8  3.6  4.1   Chloride 98 - 111 mmol/L 110  108  109   CO2 22 - 32 mmol/L '22  20  20   '$ Calcium 8.9 - 10.3 mg/dL 9.1  9.0  9.5   Total Protein 6.5 - 8.1 g/dL   6.4   Total Bilirubin 0.3 - 1.2 mg/dL   0.8   Alkaline Phos 38 - 126 U/L   51   AST 15 - 41 U/L   32   ALT 0 - 44 U/L   13     Micro:  Results for orders placed or performed during the hospital encounter of 02/27/21  Resp Panel by RT-PCR (Flu A&B, Covid) Nasopharyngeal Swab     Status: None   Collection Time: 02/28/21  2:56 AM   Specimen: Nasopharyngeal Swab; Nasopharyngeal(NP) swabs in vial transport medium  Result Value Ref Range Status   SARS Coronavirus 2 by RT PCR NEGATIVE NEGATIVE Final    Comment: (NOTE) SARS-CoV-2 target nucleic acids are NOT DETECTED.  The SARS-CoV-2 RNA is  generally detectable in upper respiratory specimens during the acute phase of infection. The lowest concentration of SARS-CoV-2 viral copies this assay can detect is 138 copies/mL. A negative result does not preclude SARS-Cov-2 infection and should not be used as the sole basis for treatment or other patient management decisions. A negative result may occur with  improper specimen collection/handling, submission of specimen other than nasopharyngeal swab, presence of viral mutation(s) within the areas targeted by this assay, and inadequate number of viral copies(<138 copies/mL). A negative result must be combined with clinical observations, patient history, and epidemiological information. The expected result is Negative.  Fact Sheet for Patients:  EntrepreneurPulse.com.au  Fact Sheet for Healthcare Providers:  IncredibleEmployment.be  This test is no t yet approved or cleared by the [place]  States FDA and  has been authorized for detection and/or diagnosis of SARS-CoV-2 by FDA under an Emergency Use Authorization (EUA). This EUA will remain  in effect (meaning this test can be used) for the duration of the COVID-19 declaration under Section 564(b)(1) of the Act, 21 U.S.C.section 360bbb-3(b)(1), unless the authorization is terminated  or revoked sooner.       Influenza A by PCR NEGATIVE NEGATIVE Final   Influenza B by PCR NEGATIVE NEGATIVE Final    Comment: (NOTE) The Xpert Xpress SARS-CoV-2/FLU/RSV plus assay is intended as an aid in the diagnosis of influenza from Nasopharyngeal swab specimens and should not be used as a sole basis for treatment. Nasal washings and aspirates are unacceptable for Xpert Xpress SARS-CoV-2/FLU/RSV testing.  Fact Sheet for Patients: EntrepreneurPulse.com.au  Fact Sheet for Healthcare Providers: IncredibleEmployment.be  This test is not yet approved or cleared by the [place] FDA and has been authorized for detection and/or diagnosis of SARS-CoV-2 by FDA under an Emergency Use Authorization (EUA). This EUA will remain in effect (meaning this test can be used) for the duration of the COVID-19 declaration under Section 564(b)(1) of the Act, 21 U.S.C. section 360bbb-3(b)(1), unless the authorization is terminated or revoked.  Performed at [facility], [place] [address] [place], [place] [address]   Urine Culture     Status: Abnormal   Collection Time: 02/28/21 10:32 AM   Specimen: Urine, Clean Catch  Result Value Ref Range Status   Specimen Description   Final    URINE, CLEAN CATCH Performed at [facility], [place] [address], [place], [place] [address]    Special Requests   Final    NONE Performed at [facility], [place] [address], [place], [place] [address]    Culture (A)  Final    60,000 COLONIES/mL MULTIPLE SPECIES PRESENT, SUGGEST RECOLLECTION   Report Status 03/01/2021 FINAL  Final    Imaging: EGD showed erosive gastritis with hemorrhage  Assessment: Ms Kelly [name] is a 81yo F with a PMH of duodenal ulcer, alcohol use, pAfib on chronic anticoagulation with eliquis, and tobacco use disorder who presents with acute on chronic abdominal pain and was found to have erosive gastritis on EGD.   Plan by Problem:  #GI Bleed She is currently HDS. Hgb: from 8.2>7.4>7.6>8.4>7.5. EGD performed and showed erosive gastritis with hemorrhage. Biopsies were done for H.Pylori testing.  -Will discharge on PPI BID (famotidine or protonix?)  -Pain control with scheduled Tylenol 1000 mg TID -Trend CBCs   #HTN Not on any medications at home. SBP ranging in the 90s-130s and DBP ranging in the 40s-50s. Last BP was 111/59. -Encourage PO intake   #pAfib Rate controlled. On eliquis, digoxin and metoprolol at home for this. GI recommended that Eliquis can be restarted in two days.  HR in the 70s. -Continue  Digoxin -Can restart Eliquis on Friday, do we restart ASA?   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 She was to continue statin therapy as well.  -Continue rosuvastatin 20 mg daily  albuterol, 2.5 mg, Q4H PRN ondansetron (ZOFRAN) IV, 4 mg, Q6H PRN traZODone, 50 mg, QHS PRN

## 2021-09-23 NOTE — Progress Notes (Addendum)
Progress Note  Primary GI: Dr. Silverio Decamp (Dr. Ardis Hughs)   Subjective  Chief Complaint:Melena, anemia  Patient sitting up in bed and has packed, states she is ready to leave.  Has bowel movement every other day, denies any recent bowel movement, no hematochezia or melena. Patient's been up and moving around states she feels so much better. Denies nausea, vomiting.    Objective   Vital signs in last 24 hours: Temp:  [97.6 F (36.4 C)-99 F (37.2 C)] 99 F (37.2 C) (09/07 0735) Pulse Rate:  [64-82] 82 (09/07 0735) Resp:  [11-20] 14 (09/07 0735) BP: (91-136)/(43-70) 101/57 (09/07 0735) SpO2:  [95 %-98 %] 97 % (09/07 0856) Last BM Date : 09/22/21 Last BM recorded by nurses in past 5 days No data recorded  General: Thin elderly female, comfortable no acute distress. Heart:  Regular rate and rhythm; systolic murmur Pulm: Clear anteriorly; no wheezing Abdomen:  Soft,  thin  AB, Active bowel sounds. No tenderness . Without guarding and Without rebound, No organomegaly appreciated. Extremities:  without  edema. Neurologic:  Alert and  oriented x4;  No focal deficits.  Psych:  Cooperative. Normal mood and affect.  Intake/Output from previous day: 09/06 0701 - 09/07 0700 In: 1140 [P.O.:840; I.V.:300] Out: -  Intake/Output this shift: No intake/output data recorded.  Studies/Results: No results found.  Lab Results: Recent Labs    09/22/21 0018 09/22/21 1813 09/23/21 0456  WBC 4.2 4.9 3.8*  HGB 7.6* 8.4* 7.5*  HCT 23.3* 25.7* 22.5*  PLT 179 195 160   BMET Recent Labs    09/21/21 0220 09/22/21 0018 09/23/21 0456  NA 142 139 140  K 4.1 3.6 3.8  CL 109 108 110  CO2 20* 20* 22  GLUCOSE 122* 145* 97  BUN '19 17 15  '$ CREATININE 1.09* 1.05* 1.25*  CALCIUM 9.5 9.0 9.1   LFT Recent Labs    09/21/21 0220  PROT 6.4*  ALBUMIN 3.5  AST 32  ALT 13  ALKPHOS 51  BILITOT 0.8   PT/INR Recent Labs    09/21/21 1114  LABPROT 17.8*  INR 1.5*      Patient profile:    81 year old female with history of CHF, Crohn's disease, glaucoma, hypothyroidism, dementia, urothelial carcinoma, perforated duodenal ulcer presented to ER with worsening epigastric and left upper quadrant abdominal pain associated with melena and worsening anemia   Impression/Plan:   Melena, anemia 09/22/2021 EGD Gastritis with stigmata of bleeding, biopsied, continue Protonix 40 mg twice daily. We will monitor for biopsy results and call patient. We will plan for close follow-up in our office with Dr. Silverio Decamp Patient has macrocytic anemia, will start patient on folate and B12 outpatient, recheck CBC and labs 1 to 2 weeks outpatient.  Iron deficiency anemia Last: Over 10 years ago Discussed with daughter and strong family history of colon cancer and should be interested in proceeding with colonoscopy. Patient would like to be discharged, gain strength and will call her office to set up follow-up appointment for outpatient colonoscopy. We will plan for close follow-up   LOS: 1 day   Vladimir Crofts  09/23/2021, 10:56 AM   Attending physician's note   I have taken a history, reviewed the chart and examined the patient. I performed a substantive portion of this encounter, including complete performance of at least one of the key components, in conjunction with the APP. I agree with the APP's note, impression and recommendations.    No further decline in hemoglobin. S/p EGD  with erosive gastritis, gastric biopsies negative for H. pylori or dysplasia  She has family history of colon cancer in multiple first and second-degree relatives CT abdomen pelvis negative for any colon wall thickening or mass lesion Patient declined undergoing colonoscopy during this hospitalization, she wants to defer it for few months until her new insurance is in place  We will arrange for outpatient GI follow-up  GI will sign off, available if have any questions  The patient was provided an opportunity  to ask questions and all were answered. The patient agreed with the plan and demonstrated an understanding of the instructions.   Damaris Hippo , MD [phone]

## 2021-09-23 NOTE — Discharge Summary (Signed)
Name: Kelly [name] MRN: [id number] DOB: July 20, 1940 81 y.o. PCP: Joya Gaskins, FNP  Date of Admission: 09/21/2021  1:50 AM Date of Discharge:  09/23/2021 Attending Physician: Dr.  Cain Sieve  Discharge Diagnosis: Principal Problem:   GI bleed Active Problems:   Epigastric pain   Acute blood loss anemia   Melena    Discharge Medications: Allergies as of 09/23/2021       Reactions   Bactrim [sulfamethoxazole-trimethoprim] Diarrhea, Nausea And Vomiting   Bee Venom Anaphylaxis        Medication List     STOP taking these medications    Aspirin Low Dose 81 MG tablet Generic drug: aspirin EC   atorvastatin 40 MG tablet Commonly known as: LIPITOR       TAKE these medications    albuterol 108 (90 Base) MCG/ACT inhaler Commonly known as: VENTOLIN HFA Inhale 2 puffs into the lungs every 6 (six) hours as needed for wheezing or shortness of breath.   carboxymethylcellulose 0.5 % Soln Commonly known as: REFRESH PLUS Place 1 drop into both eyes 3 (three) times daily as needed (dry eyes).   digoxin 0.125 MG tablet Commonly known as: LANOXIN TAKE 1 TABLET(0.125 MG) BY MOUTH DAILY What changed: See the new instructions.   dorzolamide 2 % ophthalmic solution Commonly known as: TRUSOPT Place 1 drop into the left eye 3 (three) times daily.   DULoxetine 60 MG capsule Commonly known as: CYMBALTA Take 60 mg by mouth daily.   Eliquis 2.5 MG Tabs tablet Generic drug: apixaban TAKE 1 TABLET(2.5 MG) BY MOUTH TWICE DAILY What changed: See the new instructions.   EPINEPHrine 0.3 mg/0.3 mL Soaj injection Commonly known as: EPI-PEN Inject 0.3 mg into the muscle as needed for anaphylaxis.   ferrous sulfate 325 (65 FE) MG EC tablet Take 1 tablet (325 mg total) by mouth daily with breakfast.   folic acid 1 MG tablet Commonly known as: FOLVITE Take 1 mg by mouth daily.   hydrOXYzine 10 MG tablet Commonly known as: ATARAX Take 10 mg by mouth daily as needed for  anxiety.   latanoprost 0.005 % ophthalmic solution Commonly known as: XALATAN Place 1 drop into the left eye at bedtime.   metoprolol succinate 25 MG 24 hr tablet Commonly known as: TOPROL-XL Take 1 tablet (25 mg total) by mouth daily. Start taking on: September 24, 2021   nicotine 21 mg/24hr patch Commonly known as: NICODERM CQ - dosed in mg/24 hours Place 1 patch (21 mg total) onto the skin daily. What changed: how much to take   ondansetron 4 MG tablet Commonly known as: Zofran Take 1 tablet (4 mg total) by mouth every 8 (eight) hours as needed for nausea or vomiting.   OXYGEN Inhale 1 L into the lungs daily as needed (shortness of breath).   pantoprazole 40 MG tablet Commonly known as: PROTONIX Take 1 tablet (40 mg total) by mouth 2 (two) times daily.   polyethylene glycol powder 17 GM/SCOOP powder Commonly known as: GLYCOLAX/MIRALAX Take 17 g by mouth daily as needed for constipation.   pregabalin 75 MG capsule Commonly known as: LYRICA Take 75 mg by mouth 2 (two) times daily. What changed: Another medication with the same name was removed. Continue taking this medication, and follow the directions you see here.   Repatha 140 MG/ML Sosy Generic drug: Evolocumab INJECT 1 ML UNDER THE SKIN EVERY 14 DAYS AS DIRECTED   rosuvastatin 40 MG tablet Commonly known as: CRESTOR Take 40 mg by mouth  daily.   traZODone 100 MG tablet Commonly known as: DESYREL Take 100 mg by mouth at bedtime as needed for sleep.   Trelegy Ellipta 100-62.5-25 MCG/ACT Aepb Generic drug: Fluticasone-Umeclidin-Vilant Inhale 1 puff into the lungs daily.   Vitamin B 12 500 MCG Tabs Take 500 mcg by mouth daily.   Vortex Valved [employer] by Does not apply route.        Disposition and follow-up:   Ms.Briselda [name] was discharged from [facility] in Stable condition.  At the hospital follow up visit please address:  1.  Follow-up:  a. GI bleed - Patient  presented with abdominal pain and was found to have erosive gastritis. Was prescribed Protonix 40 mg BID. She will follow up with her GI specialist and plan to check CBC.    b. Afib - Rate was controlled during hospital stay. Per GI recommendation, Eliquis can be restarted tomorrow. Cardiology appointment with Dr. Einar Gip scheduled for 9/15 at [facility].   c. Carotid artery stenosis - Had a carotid thromboendarterectomy and can continue digoxin and rosuvastatin. Cardiology appointment with Dr. Einar Gip scheduled for 9/15 at [facility].   d. HTN - Patient is stable and not on medications. Please check vitals and adjust medications to your discretion.  2.  Labs / imaging needed at time of follow-up: CBC  3.  Pending labs/ test needing follow-up: none  Follow-up Appointments:  Follow-up Information     Joya Gaskins, FNP.   Specialty: Family Medicine Contact information: [address] [place] [address] [phone]                 Hospital Course by problem list:   #GI Bleed #H/o perforated duodenal ulcer #Normocytic anemia  Patient presented with acute worsening of her chronic abdominal pain that is persistent. FOBT was positive. EGD performed and showed erosive gastritis with hemorrhage. Biopsies were done for H.Pylori testing.  Hgb trended: 7.6>8.4>7.5. Encouraged cessation of alcohol, smoking and NSAID use. She is discharged on Protonix 40 mg BID.   #pAfib Rate controlled. During her hospital stay, HR was in the 70s-80s. On eliquis, digoxin and metoprolol at home for this. Per GI recommendation, patient can restart Eliquis tomorrow (09/23/2021). Continue home medications of Digoxin.    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 Patient was on eliquis and ASA 81 mg at home. Per Cardiology consult with Dr. Einar Gip, ASA will continue to be held. She was to continue rosuvastatin 20 mg  daily.  #HTN Not on any medications at home. During her hospital stay, SBP ranged in the 90s-130s and DBP ranged in the 40s-50s. Please see PCP for management and treatment.    Subjective: Patient was sitting on the side of her bed upon my exam.  Patient reports feeling better compared to yesterday. She noted having increased energy post EGD yesterday. Reports abdominal pain present. Denies nausea, vomiting, melena. Had good PO intake. Was agreeable to going home today. Patient's daughter Claudina Lick was on the phone during the assessment and she is agreeable for discharge today. Discussed with her cardiologist Dr. Einar Gip regarding the ASA and he recommended continuing to hold the medication and a follow-up was scheduled for 9/15 at 9 am.   Discharge Vitals:   BP (!) 101/57 (BP Location: Right Arm)   Pulse 82   Temp 99 F (37.2 C) (Oral)   Resp 14   Ht 5' 1.5" (1.562 m)  Wt 41.3 kg   SpO2 97%   BMI 16.92 kg/m  Discharge exam: General: Pleasant, well-appearing in bed. No acute distress. CV: RRR. No murmurs, rubs, or gallops. No LE edema Pulmonary: Lungs CTAB. Normal effort. No wheezing or rales. Abdominal: Soft, tender of the epigastric and lumbar regions of the abdomen. Normal bowel sounds. Extremities: Radial and DP pulses 2+ and symmetric. Normal ROM. Skin: Warm and dry. No obvious rash or lesions. Neuro: A&Ox3. Moves all extremities. Normal sensation. No focal deficit. Psych: Normal mood and affect   Pertinent Labs, Studies, and Procedures:     Latest Ref Rng & Units 09/23/2021    4:56 AM 09/22/2021    6:13 PM 09/22/2021   12:18 AM  CBC  WBC 4.0 - 10.5 K/uL 3.8  4.9  4.2   Hemoglobin 12.0 - 15.0 g/dL 7.5  8.4  7.6   Hematocrit 36.0 - 46.0 % 22.5  25.7  23.3   Platelets 150 - 400 K/uL 160  195  179        Latest Ref Rng & Units 09/23/2021    4:56 AM 09/22/2021   12:18 AM 09/21/2021    2:20 AM  CMP  Glucose 70 - 99 mg/dL 97  145  122   BUN 8 - 23 mg/dL '15  17  19   '$ Creatinine  0.44 - 1.00 mg/dL 1.25  1.05  1.09   Sodium 135 - 145 mmol/L 140  139  142   Potassium 3.5 - 5.1 mmol/L 3.8  3.6  4.1   Chloride 98 - 111 mmol/L 110  108  109   CO2 22 - 32 mmol/L '22  20  20   '$ Calcium 8.9 - 10.3 mg/dL 9.1  9.0  9.5   Total Protein 6.5 - 8.1 g/dL   6.4   Total Bilirubin 0.3 - 1.2 mg/dL   0.8   Alkaline Phos 38 - 126 U/L   51   AST 15 - 41 U/L   32   ALT 0 - 44 U/L   13     CT ABDOMEN PELVIS W CONTRAST  Result Date: 09/21/2021 CLINICAL DATA:  Acute, nonlocalized abdominal pain. Long-standing pain EXAM: CT ABDOMEN AND PELVIS WITH CONTRAST TECHNIQUE: Multidetector CT imaging of the abdomen and pelvis was performed using the standard protocol following bolus administration of intravenous contrast. RADIATION DOSE REDUCTION: This exam was performed according to the departmental dose-optimization program which includes automated exposure control, adjustment of the mA and/or kV according to patient size and/or use of iterative reconstruction technique. CONTRAST:  47m OMNIPAQUE IOHEXOL 300 MG/ML  SOLN COMPARISON:  12/24/2020 FINDINGS: Lower chest: Cardiac enlargement and coronary atherosclerosis. No acute finding Hepatobiliary: No focal liver abnormality.No evidence of biliary obstruction or stone. Pancreas: Unremarkable. Spleen: Unremarkable. Adrenals/Urinary Tract: Negative adrenals. No hydronephrosis or stone. Symmetric thinned appearance of renal cortex. Unremarkable bladder. Stomach/Bowel: No obstruction. No appendicitis. Left colonic diverticulosis. Vascular/Lymphatic: No acute vascular abnormality. Extensive atheromatous plaque and irregularity of the aorta and iliacs. No mass or adenopathy. Reproductive:No pathologic findings. Other: No ascites or pneumoperitoneum. Musculoskeletal: No acute abnormalities. 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 Generalized osteopenia. IMPRESSION: No acute finding.  Stable from 2022. Electronically Signed   By: JJorje GuildM.D.    On: 09/21/2021 05:18   DG Abdomen Acute W/Chest  Result Date: 09/21/2021 CLINICAL DATA:  Chest and abdominal pain. EXAM: DG ABDOMEN ACUTE WITH 1 VIEW CHEST COMPARISON:  Chest radiograph dated 03/01/2011. FINDINGS: No focal consolidation, pleural effusion or pneumothorax.  Mild cardiomegaly. Atherosclerotic calcification of the aorta. Large amount of stool throughout the colon. No bowel dilatation or evidence of obstruction. No free air or radiopaque calculi. Osteopenia with degenerative changes of the spine. No acute osseous pathology. Total right hip arthroplasty. IMPRESSION: 1. No acute cardiopulmonary process. 2. Constipation. No bowel obstruction. Electronically Signed   By: Anner Crete M.D.   On: 09/21/2021 02:59     Discharge Instructions:     Discharge Instructions      Dear Ms. Rhett, it has been a pleasure caring for you and I am so happy to see you doing well! You were hospitalized for severe abdominal pain and treated for upper GI bleed. Since then you have recovered, you are no longer reporting severe abdominal pain. We started you on a PPI. We also continued a lot of your home medications to manage your hypertension and A-fib. In addition to medications, you were also seen by a gastroenterologist.   When you are discharged we would like you to do the following:  1. You were started on the following medications in the hospital. Please continue them using these following instructions: -Protonix 40 mg twice a day  2.  Some of your medication regimen has been changed.  Please see the following instructions for the changes: -Restart Eliquis tomorrow -Hold your home aspirin  2. Continue taking the rest of the medications as you were before you came to the hospital.   3. Follow-up with your cardiologist, gastroenterologist, and PCP    Signed: Stormy Fabian, MD 09/23/2021, 3:52 PM   Pager: [phone]

## 2021-09-26 ENCOUNTER — Encounter: Payer: Self-pay | Admitting: Gastroenterology

## 2021-10-01 ENCOUNTER — Ambulatory Visit: Payer: Medicare Other | Admitting: Cardiology

## 2021-10-06 ENCOUNTER — Other Ambulatory Visit: Payer: Self-pay

## 2021-10-06 MED ORDER — DIGOXIN 125 MCG PO TABS: 0.1250 mg | ORAL_TABLET | Freq: Every day | ORAL | 1 refills | Status: DC

## 2021-10-19 ENCOUNTER — Encounter: Payer: Self-pay | Admitting: Cardiology

## 2021-10-19 ENCOUNTER — Ambulatory Visit: Payer: Medicare Other | Admitting: Cardiology

## 2021-10-19 VITALS — BP 110/50 | HR 83 | Temp 98.2°F | Resp 16 | Ht 61.0 in | Wt 89.6 lb

## 2021-10-19 DIAGNOSIS — H3412 Central retinal artery occlusion, left eye: Secondary | ICD-10-CM | POA: Insufficient documentation

## 2021-10-19 DIAGNOSIS — I480 Paroxysmal atrial fibrillation: Secondary | ICD-10-CM

## 2021-10-19 DIAGNOSIS — Z9889 Other specified postprocedural states: Secondary | ICD-10-CM

## 2021-10-19 DIAGNOSIS — K279 Peptic ulcer, site unspecified, unspecified as acute or chronic, without hemorrhage or perforation: Secondary | ICD-10-CM

## 2021-10-19 DIAGNOSIS — I10 Essential (primary) hypertension: Secondary | ICD-10-CM

## 2021-10-19 DIAGNOSIS — E782 Mixed hyperlipidemia: Secondary | ICD-10-CM

## 2021-10-19 DIAGNOSIS — F17200 Nicotine dependence, unspecified, uncomplicated: Secondary | ICD-10-CM

## 2021-10-19 NOTE — Progress Notes (Signed)
Primary Physician/Referring:  Joya Gaskins, FNP  Patient ID: Kelly [name], female    DOB: [birth year], 81 y.o.   MRN: [id number]  No chief complaint on file.  HPI:    Kelly [name]  is a 81 y.o. Caucasian female patient with paroxysmal atrial fibrillation on chronic Eliquis therapy, hypertension, hyperlipidemia, chronic back pain and lumbar stenosis with radiculopathy, ongoing tobacco use disorder with COPD, excess alcohol drinking, PUD presents for f/u of A. Fib and hypertension.  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 Patient was admitted to [facility] on 09/21/2021 with acute GI bleed and found to have erosive gastritis by endoscopy and also Eliquis was held and advised to restart postdischarge.    Presently she has no specific complaints, states that she has reduced smoking to about 2 to 3 cigarettes a day and has been drinking only about half a bottle of wine a day.  On further questioning she admits to taking Excedrin for arthritis at least 3-4 times a week.  She was also taking aspirin which was discontinued during recent hospitalization with GI bleed on 09/21/2021.  Past Medical History:  Diagnosis Date   Anemia    Anxiety    Arthritis    B12 deficiency 05/2015   B12 was 184   Cancer (HCC)    CHF (congestive heart failure) (HCC)    COPD (chronic obstructive pulmonary disease) (HCC)    Crohn's disease, small intestine ([place]) 02/19/2009   Depression 10/2013   chronic recurrent major depressive disorder   Dysrhythmia    H/O vitamin D deficiency 08/04/2008   Heart murmur    Hypertension    Hypothyroidism    Pneumonia    Recurrent dislocation of hip, right    Past Surgical History:  Procedure Laterality Date   BIOPSY  09/22/2021   Procedure: BIOPSY;  Surgeon: Mauri Pole, MD;  Location: [facility];  Service: Gastroenterology;;   CYSTOSCOPY WITH  RETROGRADE PYELOGRAM, URETEROSCOPY AND STENT PLACEMENT Left 02/17/2021   Procedure: CYSTOSCOPY WITH  RETROGRADE PYELOGRAM, LEFT URETEROSCOPY , BIOPSY AND tumor ablation  STENT PLACEMENT;  Surgeon: Alexis Frock, MD;  Location: [facility];  Service: Urology;  Laterality: Left;   ESOPHAGOGASTRODUODENOSCOPY (EGD) WITH PROPOFOL N/A 09/22/2021   Procedure: ESOPHAGOGASTRODUODENOSCOPY (EGD) WITH PROPOFOL;  Surgeon: Mauri Pole, MD;  Location: [facility];  Service: Gastroenterology;  Laterality: N/A;   HOLMIUM LASER APPLICATION Left 0/03/4915   Procedure: HOLMIUM LASER APPLICATION;  Surgeon: Alexis Frock, MD;  Location: [facility];  Service: Urology;  Laterality: Left;   JOINT REPLACEMENT     Right hip, 2015   LAPAROSCOPY  06/03/2016   Duodenal ulcer repair   TONSILLECTOMY     TUBAL LIGATION     Family History  Problem Relation Age of Onset   Heart disease Mother    Alcohol abuse Mother    Colon cancer Sister    Breast cancer Sister    Heart attack Brother    Cancer Neg Hx    Diabetes Neg Hx     Social History   Tobacco Use   Smoking status: Every Day    Packs/day: 1.00    Years: 58.00    Total pack years: 58.00    Types: Cigarettes   Smokeless tobacco: Never   Tobacco comments:    trying to quit, wearing nicotine patch. Cautioned against smoking and wearing patch.1/4 ppd 03/18/21  Substance Use Topics  Alcohol use: Yes    Comment: OCC   Marital Status: Married  ROS  Review of Systems  Constitutional: Negative for malaise/fatigue and weight gain.  Cardiovascular:  Positive for dyspnea on exertion (stable). Negative for chest pain, claudication, leg swelling, near-syncope, orthopnea, palpitations, paroxysmal nocturnal dyspnea and syncope.  Respiratory:  Positive for cough and wheezing (chronic). Negative for shortness of breath.   Musculoskeletal:  Positive for arthritis (right hip).  Psychiatric/Behavioral:  The patient is nervous/anxious.    Objective  Blood pressure (!)  110/50, [name] 83, temperature 98.2 F (36.8 C), temperature source Temporal, resp. rate 16, height _0  (1.549 m), weight 89 lb 9.6 oz (40.6 kg), SpO2 96 %. Body mass index is 16.93 kg/m.     10/19/2021    3:31 PM 09/23/2021    4:03 PM 09/23/2021    7:35 AM  Vitals with BMI  Height _1     Weight 89 lbs 10 oz    BMI 83.66    Systolic 294 765 465  Diastolic 50 46 57  [name] 83 62 82     Physical Exam Vitals reviewed.  Constitutional:      Appearance: She is underweight. She is not ill-appearing.  Neck:     Vascular: Carotid bruit (bilateral) present. No JVD.  Cardiovascular:     Rate and Rhythm: Normal rate and regular rhythm.     Pulses:          Femoral pulses are 1+ on the right side and 2+ on the left side.      Popliteal pulses are 0 on the right side and 2+ on the left side.       Dorsalis pedis pulses are 0 on the right side and 0 on the left side.       Posterior tibial pulses are 0 on the right side and 0 on the left side.     Heart sounds: Murmur heard.     Harsh midsystolic murmur is present with a grade of 3/6 at the upper right sternal border and apex radiating to the neck.     No gallop.  Pulmonary:     Effort: Pulmonary effort is normal.     Breath sounds: Wheezing (bilateral diffuse) and rales (bilateral diffuse) present.  Musculoskeletal:        General: No swelling.    Laboratory examination:   Lab Results  Component Value Date   NA 140 09/23/2021   K 3.8 09/23/2021   CO2 22 09/23/2021   GLUCOSE 97 09/23/2021   BUN 15 09/23/2021   CREATININE 1.25 (H) 09/23/2021   CALCIUM 9.1 09/23/2021   GFRNONAA 43 (L) 09/23/2021       Latest Ref Rng & Units 09/23/2021    4:56 AM 09/22/2021   12:18 AM 09/21/2021    2:20 AM  CMP  Glucose 70 - 99 mg/dL 97  145  122   BUN 8 - 23 mg/dL _2 Creatinine 0.44 - 1.00 mg/dL 1.25  1.05  1.09   Sodium 135 - 145 mmol/L 140  139  142   Potassium 3.5 - 5.1 mmol/L 3.8  3.6  4.1   Chloride 98 - 111 mmol/L 110  108  109    CO2 22 - 32 mmol/L _3 Calcium 8.9 - 10.3 mg/dL 9.1  9.0  9.5   Total Protein 6.5 - 8.1 g/dL   6.4   Total Bilirubin 0.3 - 1.2  mg/dL   0.8   Alkaline Phos 38 - 126 U/L   51   AST 15 - 41 U/L   32   ALT 0 - 44 U/L   13       Latest Ref Rng & Units 09/23/2021    4:56 AM 09/22/2021    6:13 PM 09/22/2021   12:18 AM  CBC  WBC 4.0 - 10.5 K/uL 3.8  4.9  4.2   Hemoglobin 12.0 - 15.0 g/dL 7.5  8.4  7.6   Hematocrit 36.0 - 46.0 % 22.5  25.7  23.3   Platelets 150 - 400 K/uL 160  195  179    Lipid Panel     Component Value Date/Time   CHOL 203 (H) 07/13/2021 1020   TRIG 151 (H) 07/13/2021 1020   HDL 64 07/13/2021 1020   LDLCALC 113 (H) 07/13/2021 1020    External labs:   Labs 08/29/2021:  C-reactive protein 0.93.  Sodium 138, potassium 4.1, BUN 24, creatinine 1.0, EGFR 57.  mL.  Total cholesterol 209, triglycerides 81, HDL 46, LDL 144. Allergies   Allergies  Allergen Reactions   Bactrim [Sulfamethoxazole-Trimethoprim] Diarrhea and Nausea And Vomiting   Bee Venom Anaphylaxis     Final Medications at End of Visit    Current Meds  Medication Sig   albuterol (VENTOLIN HFA) 108 (90 Base) MCG/ACT inhaler Inhale 2 puffs into the lungs every 6 (six) hours as needed for wheezing or shortness of breath.   carboxymethylcellulose (REFRESH PLUS) 0.5 % SOLN Place 1 drop into both eyes 3 (three) times daily as needed (dry eyes).   Cyanocobalamin (VITAMIN B 12) 500 MCG TABS Take 500 mcg by mouth daily.   digoxin (LANOXIN) 0.125 MG tablet Take 1 tablet (0.125 mg total) by mouth daily. TAKE 1 TABLET(0.125 MG) BY MOUTH DAILY Strength: 0.125 mg   dorzolamide (TRUSOPT) 2 % ophthalmic solution Place 1 drop into the left eye 3 (three) times daily.   DULoxetine (CYMBALTA) 60 MG capsule Take 60 mg by mouth daily.   ELIQUIS 2.5 MG TABS tablet TAKE 1 TABLET(2.5 MG) BY MOUTH TWICE DAILY (Patient taking differently: Take 2.5 mg by mouth 2 (two) times daily.)   EPINEPHrine 0.3 mg/0.3 mL IJ SOAJ  injection Inject 0.3 mg into the muscle as needed for anaphylaxis.   ferrous sulfate 325 (65 FE) MG EC tablet Take 1 tablet (325 mg total) by mouth daily with breakfast.   Fluticasone-Umeclidin-Vilant (TRELEGY ELLIPTA) 100-62.5-25 MCG/ACT AEPB Inhale 1 puff into the lungs daily.   folic acid (FOLVITE) 1 MG tablet Take 1 mg by mouth daily.   hydrOXYzine (ATARAX) 10 MG tablet Take 10 mg by mouth daily as needed for anxiety.   latanoprost (XALATAN) 0.005 % ophthalmic solution Place 1 drop into the left eye at bedtime.   metoprolol succinate (TOPROL-XL) 25 MG 24 hr tablet Take 1 tablet (25 mg total) by mouth daily.   nicotine (NICODERM CQ - DOSED IN MG/24 HOURS) 21 mg/24hr patch Place 1 patch (21 mg total) onto the skin daily. (Patient taking differently: Place 14 mg onto the skin daily.)   ondansetron (ZOFRAN) 4 MG tablet Take 1 tablet (4 mg total) by mouth every 8 (eight) hours as needed for nausea or vomiting.   OXYGEN Inhale 1 L into the lungs daily as needed (shortness of breath).   pantoprazole (PROTONIX) 40 MG tablet Take 1 tablet (40 mg total) by mouth 2 (two) times daily.   polyethylene glycol powder (GLYCOLAX/MIRALAX) 17 GM/SCOOP powder  Take 17 g by mouth daily as needed for constipation.   pregabalin (LYRICA) 75 MG capsule Take 75 mg by mouth 2 (two) times daily.   rosuvastatin (CRESTOR) 40 MG tablet Take 40 mg by mouth daily.   Spacer/Aero-Holding Chambers (VORTEX VALVED HOLDING CHAMBER) DEVI by Does not apply route.   traZODone (DESYREL) 100 MG tablet Take 100 mg by mouth at bedtime as needed for sleep.    Medications after today's encounter: Current Outpatient Medications  Medication Instructions   albuterol (VENTOLIN HFA) 108 (90 Base) MCG/ACT inhaler 2 puffs, Inhalation, Every 6 hours PRN   carboxymethylcellulose (REFRESH PLUS) 0.5 % SOLN 1 drop, Both Eyes, 3 times daily PRN   digoxin (LANOXIN) 0.125 mg, Oral, Daily, TAKE 1 TABLET(0.125 MG) BY MOUTH DAILY<BR>Strength: 0.125 mg    dorzolamide (TRUSOPT) 2 % ophthalmic solution 1 drop, Left Eye, 3 times daily   DULoxetine (CYMBALTA) 60 mg, Oral, Daily   ELIQUIS 2.5 MG TABS tablet TAKE 1 TABLET(2.5 MG) BY MOUTH TWICE DAILY   EPINEPHrine (EPI-PEN) 0.3 mg, Intramuscular, As needed   ferrous sulfate 325 mg, Oral, Daily with breakfast   Fluticasone-Umeclidin-Vilant (TRELEGY ELLIPTA) 100-62.5-25 MCG/ACT AEPB 1 puff, Inhalation, Daily   folic acid (FOLVITE) 1 mg, Oral, Daily   hydrOXYzine (ATARAX) 10 mg, Oral, Daily PRN   latanoprost (XALATAN) 0.005 % ophthalmic solution 1 drop, Left Eye, Daily at bedtime   metoprolol succinate (TOPROL-XL) 25 mg, Oral, Daily   nicotine (NICODERM CQ - DOSED IN MG/24 HOURS) 21 mg, Transdermal, Daily   ondansetron (ZOFRAN) 4 mg, Oral, Every 8 hours PRN   OXYGEN 1 L, Inhalation, Daily PRN   pantoprazole (PROTONIX) 40 mg, Oral, 2 times daily   polyethylene glycol powder (GLYCOLAX/MIRALAX) 17 g, Oral, Daily PRN   pregabalin (LYRICA) 75 mg, Oral, 2 times daily   REPATHA 140 MG/ML SOSY INJECT 1 ML UNDER THE SKIN EVERY 14 DAYS AS DIRECTED   rosuvastatin (CRESTOR) 40 mg, Oral, Daily   Spacer/Aero-Holding Chambers (VORTEX VALVED HOLDING CHAMBER) DEVI Does not apply   traZODone (DESYREL) 100 mg, Oral, At bedtime PRN   Vitamin B 12 500 mcg, Oral, Daily    Radiology:   Chest x-ray PA and lateral view 06/11/2019: Cardiomegaly with atherosclerotic changes of the aorta. New small left pleural effusion. COPD changes with fine reticular prominence and hyperinflation.  Cardiac Studies:   Echocardiogram 08/22/2016: - Left ventricle: The cavity size was normal. There was severe  concentric hypertrophy. Cordal SAM is noted. Systolic function   was vigorous. The estimated ejection fraction was in the range of  65% to 70%. There was dynamic obstruction during Valsalvain the mid cavity, with mid-cavity obliteration, a peak velocity of 421 cm/sec, and a peak gradient of 71 mm Hg. Doppler parameters are consistent  with abnormal left ventricular relaxation (grade 1  diastolic dysfunction). The E/e&' ratio is >15, suggesting  elevated LV filling pressure. - Mitral valve: Mildly thickened leaflets. Cordal SAM is noted.   There was trivial regurgitation. - Left atrium: The atrium was normal in size. - Inferior vena cava: The vessel was normal in size. The   respirophasic diameter changes were in the normal range (>= 50%), consistent with normal central venous pressure.   Impressions: - LVEF 65-70%, severe concentric LVH with cordal SAM, dynamic   mid-cavitary gradient of 41 mmHg at rest that increases to 71   mmHg with valsalva.  PCV MYOCARDIAL PERFUSION WITH LEXISCAN 06/22/2020 Lexiscan nuclear stress test performed using 1-day protocol. Normal myocardial perfusion. Stress LVEF  calculated 44%, but visually appears normal. Low risk study.   PCV ECHOCARDIOGRAM COMPLETE 07/14/2021  Narrative Echocardiogram 07/14/2021: Left ventricle cavity is normal in size. Severe concentric hypertrophy of the left ventricle. Normal global wall motion. Normal LV systolic function with EF 57%. Diastolic function not assessed due to severity of mitral regurgitation. Left atrial cavity is severely dilated. Structurally normal trileaflet aortic valve.  Trace aortic regurgitation. No significant valvular stenosis. Increased LVOT velocity due to LVOT obstruction. Chordal SAM seen. LVOT gradient at least 89 mmHg. Structurally normal mitral valve. Moderate (Grade III) mitral regurgitation. Mild tricuspid regurgitation. Estimated pulmonary artery systolic pressure 41 mmHg. Compared to previous study on 06/17/2020. chordal SAM more prominent. Peak LVOT gradient better appreciated, and appears to be at least 89 mmHg. Differentials include 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. Clinical correlation recommended. No significant change from 06/17/2020.  Carotid artery duplex 07/14/2021: Duplex suggests stenosis in the right  internal carotid artery (50-69%). Duplex suggests stenosis in the left internal carotid artery (>=70%). The left PSV internal/common carotid artery ratio of 5.29 is consistent with a stenosis of >70%. Duplex suggests stenosis in the left external carotid artery (<50%). Antegrade right vertebral artery flow. Antegrade left vertebral artery flow. No significant change since prior study 12/30/2020. Follow up in six months is appropriate if clinically indicated.  Carotid artery duplex 10/13/2021: Moderate scattered plaque in the bilateral carotid arteries. Borderline  elevated velocities in the left internal carotid artery, with left ICA/CCA  ratio 2.04, suggesting 50-69% stenosis.  Left carotid artery endarterectomy site is patent. Normal right velocities and ratio. Bilateral antegrade vertebral artery flow.  Echocardiogram 07/29/2021:   NORMAL LEFT VENTRICULAR SYSTOLIC FUNCTION WITH SEVERE LVH. Hyperdynamic left ventricular systolic function >33% with severe left    ventricular hypertrophy - there is systolic anterior motion of the mitral  valve leaflet - pattern consistent with hypertrophic obstruction CM.    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 Resting gradient of 50 mmHg.    - There is severe mitral valve regurgitation present with SAM of the mitral valve.  Compared to office echocardiogram 07/06/2021, peak LVOT gradient was 89 mmHg and PA pressure was estimated at 41 mmHg.   EKG:   07/23/2021: Sinus rhythm at a rate of 68 bpm.  Left atrial enlargement.  Normal axis.  LVH with secondary repolarization abnormality, cannot exclude lateral ischemia.  Compared EKG 09/30/2020, no significant change.  08/13/2020: Normal sinus rhythm at rate of 93 bpm, left atrial enlargement, normal axis.  Anteroseptal infarct old.  LVH with repolarization abnormality, cannot exclude lateral ischemia.  No significant change from 06/04/2020.     Assessment      ICD-10-CM   1. Paroxysmal atrial fibrillation (HCC)  I48.0 CBC    CBC    2. Primary hypertension  I10     3. History of left-sided carotid endarterectomy 08/14/2021  Z98.890     4. Tobacco use disorder  F17.200     5. Mixed hyperlipidemia  E78.2 Lipoprotein A (LPA)    Lipid Panel With LDL/HDL Ratio    Lipoprotein A (LPA)    Lipid Panel With LDL/HDL Ratio    6. PUD (peptic ulcer disease)  K27.9       CHA2DS2-VASc Score is 5.  Yearly risk of stroke: 7.2% (F, A, HTN, Vasc Dz).  Score of 1=0.6; 2=2.2; 3=3.2; 4=4.8; 5=7.2; 6=9.8; 7=>9.8) -(CHF; HTN; vasc disease DM,  Female = 1; Age <65 =0; 65-74 = 1,  >75 =2;  stroke/embolism= 2).    No orders of the defined types were placed in this encounter.   Orders Placed This Encounter  Procedures   Lipoprotein A (LPA)    Standing Status:   Future    Number of Occurrences:   1    Standing Expiration Date:   10/20/2022   Lipid Panel With LDL/HDL Ratio    Standing Status:   Future    Number of Occurrences:   1    Standing Expiration Date:   10/20/2022   CBC    Standing Status:   Future    Number of Occurrences:   1    Standing Expiration Date:   10/20/2022   Recommendations:   Kelly [name] is a 81 y.o. Caucasian female patient with paroxysmal atrial fibrillation on chronic Eliquis therapy, hypertension, hyperlipidemia, chronic back pain and lumbar stenosis with radiculopathy, ongoing tobacco use disorder with COPD, excess alcohol drinking, PUD presents for f/u of A. Fib and hypertension.  Patient was admitted to [facility] on 09/21/2021 with acute GI bleed and found to have erosive gastritis by endoscopy and also Eliquis was held and advised to restart postdischarge.  In the interim she also presented with left central retinal artery occlusion and acute left-sided vision loss and underwent left carotid endarterectomy on 08/04/2021 at [facility].    Presently doing well, no clinical evidence of heart failure, she  has not had any recurrence of GI bleed, has dark stools due to iron supplements.  On questioning, she was also taking Excedrin at least 3-4 times a week and also since carotid endarterectomy was on aspirin 81 mg daily which has since been discontinued after recent GI bleed on 09/21/2021.  I have strictly recommended that she do not use any NSAIDs and the risk of bleeding.  Her son-in-law was present and these questions were answered in detail.  She was previously on Repatha, she has discontinued this and does not want to be on injectables.  She is presently tolerating Crestor, previously was on atorvastatin.  I would like to obtain CBC along with lipid profile testing in 1 month and see her back in 6 weeks for close monitoring.  I have encouraged her to quit smoking.  She will need continued carotid artery surveillance.  She is also being followed at [facility] for the same.  However she has recently had carotid artery duplex and also echocardiogram.  She was scheduled for the same tests by the end of this month, which I will discontinue for now.  Labs and procedures reviewed.  This was a 60-minute office visit encounter.     Adrian Prows, MD, [facility] 10/19/2021, 9:56 PM Office: [phone] Fax: [phone] Pager: [phone]

## 2021-10-19 NOTE — Patient Instructions (Signed)
Please go fasting after 6 to 8 hours to [facility] sometime around 11/19/2021 to get cholesterol and also blood count checked.

## 2021-11-27 LAB — CBC
Hematocrit: 32.2 % — ABNORMAL LOW (ref 34.0–46.6)
Hemoglobin: 10.6 g/dL — ABNORMAL LOW (ref 11.1–15.9)
MCH: 30.7 pg (ref 26.6–33.0)
MCHC: 32.9 g/dL (ref 31.5–35.7)
MCV: 93 fL (ref 79–97)
Platelets: 192 10*3/uL (ref 150–450)
RBC: 3.45 x10E6/uL — ABNORMAL LOW (ref 3.77–5.28)
RDW: 12.6 % (ref 11.7–15.4)
WBC: 5.6 10*3/uL (ref 3.4–10.8)

## 2021-11-27 LAB — LIPID PANEL WITH LDL/HDL RATIO
Cholesterol, Total: 287 mg/dL — ABNORMAL HIGH (ref 100–199)
HDL: 55 mg/dL (ref >39)
LDL Chol Calc (NIH): 201 mg/dL — ABNORMAL HIGH (ref 0–99)
LDL/HDL Ratio: 3.7 ratio — ABNORMAL HIGH (ref 0.0–3.2)
Triglycerides: 166 mg/dL — ABNORMAL HIGH (ref 0–149)
VLDL Cholesterol Cal: 31 mg/dL (ref 5–40)

## 2021-11-27 LAB — LIPOPROTEIN A (LPA): Lipoprotein (a): 23.1 nmol/L (ref <75.0)

## 2021-11-30 ENCOUNTER — Ambulatory Visit: Payer: Medicare Other | Admitting: Cardiology

## 2021-11-30 ENCOUNTER — Encounter: Payer: Self-pay | Admitting: Cardiology

## 2021-11-30 VITALS — BP 139/79 | HR 72 | Resp 16 | Ht 61.0 in | Wt 87.0 lb

## 2021-11-30 DIAGNOSIS — I480 Paroxysmal atrial fibrillation: Secondary | ICD-10-CM

## 2021-11-30 DIAGNOSIS — Z9889 Other specified postprocedural states: Secondary | ICD-10-CM

## 2021-11-30 DIAGNOSIS — I10 Essential (primary) hypertension: Secondary | ICD-10-CM

## 2021-11-30 DIAGNOSIS — E782 Mixed hyperlipidemia: Secondary | ICD-10-CM

## 2021-11-30 MED ORDER — DILTIAZEM HCL ER COATED BEADS 120 MG PO CP24: 120.0000 mg | ORAL_CAPSULE | Freq: Every day | ORAL | 3 refills | Status: DC

## 2021-11-30 NOTE — Patient Instructions (Signed)
Please discontinued digoxin.  I started you on a new medicine called diltiazem CD 120 mg daily.  Do not stop any of the other medications.

## 2021-11-30 NOTE — Progress Notes (Signed)
Primary Physician/Referring:  Joya Gaskins, FNP  Patient ID: Kelly [name], female    DOB: [birth year], 81 y.o.   MRN: [id number]  Chief Complaint  Patient presents with   Atrial Fibrillation   Hypertension   Hyperlipidemia   Follow-up    6 week    HPI:    Kelly [name]  is a 81 y.o. Caucasian female patient with paroxysmal atrial fibrillation on chronic Eliquis therapy, hypertension, hyperlipidemia, chronic back pain and lumbar stenosis with radiculopathy, ongoing tobacco use disorder with COPD, excess alcohol drinking, PUD with UGIB on 09/21/2021. Eliquis restarted in OP basis.   She also presented with left central retinal artery occlusion and acute left-sided vision loss and underwent left carotid endarterectomy on 08/04/2021 at [facility].   I had seen her 6 weeks ago, patient was taking Excedrin, was also drinking alcohol and also was still smoking.  After long discussions, she now presents for follow-up and states that she has not been taking any further aspirin, has been taking Tylenol and also Lyrica has been helping her significantly with back pain.  She also quit drinking alcohol and quit smoking cigarettes.  Past Medical History:  Diagnosis Date   Anemia    Anxiety    Arthritis    B12 deficiency 05/2015   B12 was 184   Cancer (HCC)    CHF (congestive heart failure) (HCC)    COPD (chronic obstructive pulmonary disease) (HCC)    Crohn's disease, small intestine ([place]) 02/19/2009   Depression 10/2013   chronic recurrent major depressive disorder   Dysrhythmia    H/O vitamin D deficiency 08/04/2008   Heart murmur    Hypertension    Hypothyroidism    Pneumonia    Recurrent dislocation of hip, right    Past Surgical History:  Procedure Laterality Date   BIOPSY  09/22/2021   Procedure: BIOPSY;  Surgeon: Mauri Pole, MD;  Location: [facility];  Service: Gastroenterology;;   CYSTOSCOPY WITH RETROGRADE PYELOGRAM, URETEROSCOPY AND  STENT PLACEMENT Left 02/17/2021   Procedure: CYSTOSCOPY WITH  RETROGRADE PYELOGRAM, LEFT URETEROSCOPY , BIOPSY AND tumor ablation  STENT PLACEMENT;  Surgeon: Alexis Frock, MD;  Location: [facility];  Service: Urology;  Laterality: Left;   ESOPHAGOGASTRODUODENOSCOPY (EGD) WITH PROPOFOL N/A 09/22/2021   Procedure: ESOPHAGOGASTRODUODENOSCOPY (EGD) WITH PROPOFOL;  Surgeon: Mauri Pole, MD;  Location: [place];  Service: Gastroenterology;  Laterality: N/A;   HOLMIUM LASER APPLICATION Left 07/20/2593   Procedure: HOLMIUM LASER APPLICATION;  Surgeon: Alexis Frock, MD;  Location: [facility];  Service: Urology;  Laterality: Left;   JOINT REPLACEMENT     Right hip, 2015   LAPAROSCOPY  06/03/2016   Duodenal ulcer repair   TONSILLECTOMY     TUBAL LIGATION     Family History  Problem Relation Age of Onset   Heart disease Mother    Alcohol abuse Mother    Colon cancer Sister    Breast cancer Sister    Heart attack Brother    Cancer Neg Hx    Diabetes Neg Hx     Social History   Tobacco Use   Smoking status: Former    Packs/day: 1.00    Years: 58.00    Total pack years: 58.00    Types: Cigarettes    Quit date: 09/2021    Years since quitting: 0.2   Smokeless tobacco: Never   Tobacco comments:    trying to quit, wearing nicotine patch. Cautioned against smoking and wearing patch.1/4 ppd 03/18/21  Substance Use Topics   Alcohol use: Yes    Comment: OCC   Marital Status: Married  ROS  Review of Systems  Cardiovascular:  Positive for dyspnea on exertion. Negative for chest pain and leg swelling.  Respiratory:  Positive for cough and wheezing.    Objective  Blood pressure 139/79, [name], resp. rate 16, height _0  (1.549 m), weight 87 lb (39.5 kg), SpO2 94 %. Body mass index is 16.44 kg/m.     11/30/2021    9:34 AM 10/19/2021    3:31 PM 09/23/2021    4:03 PM  Vitals with BMI  Height _1  _2    Weight 87 lbs 89 lbs 10 oz   BMI 85.46 27.03   Systolic 500 938 182  Diastolic  79 50 46  [name] 83 62     Physical Exam Vitals reviewed.  Constitutional:      Appearance: She is underweight. She is not ill-appearing.  Neck:     Vascular: Carotid bruit (bilateral) present. No JVD.  Cardiovascular:     Rate and Rhythm: Normal rate and regular rhythm.     Pulses:          Femoral pulses are 1+ on the right side and 2+ on the left side.      Popliteal pulses are 0 on the right side and 2+ on the left side.       Dorsalis pedis pulses are 0 on the right side and 0 on the left side.       Posterior tibial pulses are 0 on the right side and 0 on the left side.     Heart sounds: Murmur heard.     Harsh midsystolic murmur is present with a grade of 3/6 at the upper right sternal border and apex radiating to the neck.     No gallop.  Pulmonary:     Effort: Pulmonary effort is normal.     Breath sounds: Rales (occasional scattered) present.  Musculoskeletal:        General: No swelling.    Laboratory examination:   Lab Results  Component Value Date   NA 140 09/23/2021   K 3.8 09/23/2021   CO2 22 09/23/2021   GLUCOSE 97 09/23/2021   BUN 15 09/23/2021   CREATININE 1.25 (H) 09/23/2021   CALCIUM 9.1 09/23/2021   GFRNONAA 43 (L) 09/23/2021       Latest Ref Rng & Units 09/23/2021    4:56 AM 09/22/2021   12:18 AM 09/21/2021    2:20 AM  CMP  Glucose 70 - 99 mg/dL 97  145  122   BUN 8 - 23 mg/dL _3 Creatinine 0.44 - 1.00 mg/dL 1.25  1.05  1.09   Sodium 135 - 145 mmol/L 140  139  142   Potassium 3.5 - 5.1 mmol/L 3.8  3.6  4.1   Chloride 98 - 111 mmol/L 110  108  109   CO2 22 - 32 mmol/L _4 Calcium 8.9 - 10.3 mg/dL 9.1  9.0  9.5   Total Protein 6.5 - 8.1 g/dL   6.4   Total Bilirubin 0.3 - 1.2 mg/dL   0.8   Alkaline Phos 38 - 126 U/L   51   AST 15 - 41 U/L   32   ALT 0 - 44 U/L   13       Latest Ref Rng & Units 11/26/2021  9:03 AM 09/23/2021    4:56 AM 09/22/2021    6:13 PM  CBC  WBC 3.4 - 10.8 x10E3/uL 5.6  3.8  4.9   Hemoglobin 11.1 -  15.9 g/dL 10.6  7.5  8.4   Hematocrit 34.0 - 46.6 % 32.2  22.5  25.7   Platelets 150 - 450 x10E3/uL 192  160  195    Lipid Panel     Component Value Date/Time   CHOL 287 (H) 11/26/2021 0903   TRIG 166 (H) 11/26/2021 0903   HDL 55 11/26/2021 0903   LDLCALC 201 (H) 11/26/2021 0903    External labs:   Labs 08/29/2021:  C-reactive protein 0.93.  Sodium 138, potassium 4.1, BUN 24, creatinine 1.0, EGFR 57.  mL.  Total cholesterol 209, triglycerides 81, HDL 46, LDL 144. Allergies   Allergies  Allergen Reactions   Bactrim [Sulfamethoxazole-Trimethoprim] Diarrhea and Nausea And Vomiting   Bee Venom Anaphylaxis    Medications   Current Outpatient Medications:    albuterol (VENTOLIN HFA) 108 (90 Base) MCG/ACT inhaler, Inhale 2 puffs into the lungs every 6 (six) hours as needed for wheezing or shortness of breath., Disp: 8 g, Rfl: 6   carboxymethylcellulose (REFRESH PLUS) 0.5 % SOLN, Place 1 drop into both eyes 3 (three) times daily as needed (dry eyes)., Disp: , Rfl:    diltiazem (CARDIZEM CD) 120 MG 24 hr capsule, Take 1 capsule (120 mg total) by mouth daily., Disp: 90 capsule, Rfl: 3   dorzolamide (TRUSOPT) 2 % ophthalmic solution, Place 1 drop into the left eye 3 (three) times daily., Disp: , Rfl:    DULoxetine (CYMBALTA) 60 MG capsule, Take 60 mg by mouth daily., Disp: , Rfl:    ELIQUIS 2.5 MG TABS tablet, TAKE 1 TABLET(2.5 MG) BY MOUTH TWICE DAILY (Patient taking differently: Take 2.5 mg by mouth 2 (two) times daily.), Disp: 180 tablet, Rfl: 1   EPINEPHrine 0.3 mg/0.3 mL IJ SOAJ injection, Inject 0.3 mg into the muscle as needed for anaphylaxis., Disp: , Rfl:    ferrous sulfate 325 (65 FE) MG EC tablet, Take 1 tablet (325 mg total) by mouth daily with breakfast., Disp: 90 tablet, Rfl: 1   Fluticasone-Umeclidin-Vilant (TRELEGY ELLIPTA) 100-62.5-25 MCG/ACT AEPB, Inhale 1 puff into the lungs daily., Disp: 1 each, Rfl: 11   folic acid (FOLVITE) 1 MG tablet, Take 1 mg by mouth daily.,  Disp: , Rfl:    hydrOXYzine (ATARAX) 10 MG tablet, Take 10 mg by mouth daily as needed for anxiety., Disp: , Rfl:    latanoprost (XALATAN) 0.005 % ophthalmic solution, Place 1 drop into the left eye at bedtime., Disp: , Rfl:    metoprolol succinate (TOPROL-XL) 25 MG 24 hr tablet, Take 1 tablet (25 mg total) by mouth daily., Disp: 30 tablet, Rfl: 0   nicotine (NICODERM CQ - DOSED IN MG/24 HOURS) 21 mg/24hr patch, Place 1 patch (21 mg total) onto the skin daily. (Patient taking differently: Place 14 mg onto the skin daily.), Disp: 28 patch, Rfl: 0   ondansetron (ZOFRAN) 4 MG tablet, Take 1 tablet (4 mg total) by mouth every 8 (eight) hours as needed for nausea or vomiting., Disp: 15 tablet, Rfl: 1   OXYGEN, Inhale 1 L into the lungs daily as needed (shortness of breath)., Disp: , Rfl:    pantoprazole (PROTONIX) 40 MG tablet, Take 1 tablet (40 mg total) by mouth 2 (two) times daily., Disp: 180 tablet, Rfl: 0   pregabalin (LYRICA) 75 MG capsule, Take 75 mg  by mouth 2 (two) times daily., Disp: , Rfl:    REPATHA 140 MG/ML SOSY, INJECT 1 ML UNDER THE SKIN EVERY 14 DAYS AS DIRECTED, Disp: 2 mL, Rfl: 3   rosuvastatin (CRESTOR) 40 MG tablet, Take 40 mg by mouth daily., Disp: , Rfl:    Spacer/Aero-Holding Chambers (VORTEX VALVED HOLDING CHAMBER) DEVI, by Does not apply route., Disp: , Rfl:    traZODone (DESYREL) 100 MG tablet, Take 100 mg by mouth at bedtime as needed for sleep., Disp: , Rfl:    Radiology:   Chest x-ray PA and lateral view 06/11/2019: Cardiomegaly with atherosclerotic changes of the aorta. New small left pleural effusion. COPD changes with fine reticular prominence and hyperinflation.  Cardiac Studies:   PCV MYOCARDIAL PERFUSION WITH LEXISCAN 06/22/2020 Lexiscan nuclear stress test performed using 1-day protocol. Normal myocardial perfusion. Stress LVEF calculated 44%, but visually appears normal. Low risk study.   Carotid artery duplex 10/13/2021: Moderate scattered plaque in the  bilateral carotid arteries. Borderline elevated velocities in the left internal carotid artery, with left ICA/CCA  ratio 2.04, suggesting 50-69% stenosis.  Left carotid artery endarterectomy site is patent. Normal right velocities and ratio. Bilateral antegrade vertebral artery flow.  Echocardiogram 07/29/2021:   NORMAL LEFT VENTRICULAR SYSTOLIC FUNCTION WITH SEVERE LVH. Hyperdynamic left ventricular systolic function >74% with severe left    ventricular hypertrophy - there is systolic anterior motion of the mitral  valve leaflet - pattern consistent with hypertrophic obstruction CM.    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 Resting gradient of 50 mmHg.    - There is severe mitral valve regurgitation present with SAM of the mitral valve.  Compared to office echocardiogram 07/06/2021, peak LVOT gradient was 89 mmHg and PA pressure was estimated at 41 mmHg.  EKG:   EKG 11/30/2021: Ectopic atrial rhythm ventricular rate 71 bpm.  Normal axis.  Anteroseptal infarct old.  LVH with repolarization abnormality, cannot exclude lateral ischemia.  Compared to 07/22/2021, ectopic atrial rhythm is new.  Assessment     ICD-10-CM   1. Paroxysmal atrial fibrillation (HCC)  I48.0 EKG 12-Lead    diltiazem (CARDIZEM CD) 120 MG 24 hr capsule    2. Primary hypertension  I10     3. History of left-sided carotid endarterectomy 08/14/2021  Z98.890     4. Mixed hyperlipidemia  E78.2       CHA2DS2-VASc Score is 5.  Yearly risk of stroke: 7.2% (F, A, HTN, Vasc Dz).  Score of 1=0.6; 2=2.2; 3=3.2; 4=4.8; 5=7.2; 6=9.8; 7=>9.8) -(CHF; HTN; vasc disease DM,  Female = 1; Age <65 =0; 65-74 = 1,  >75 =2; stroke/embolism= 2).    Meds ordered this encounter  Medications   diltiazem (CARDIZEM CD) 120 MG 24 hr capsule    Sig: Take 1 capsule (120 mg total) by mouth daily.    Dispense:  90 capsule    Refill:  3    Discontinue Digoxin   Orders Placed This Encounter   Procedures   EKG 12-Lead   Recommendations:   Kelly [name] is a 81 y.o. Caucasian female patient with paroxysmal atrial fibrillation on chronic Eliquis therapy, hypertension, hyperlipidemia, chronic back pain and lumbar stenosis with radiculopathy, ongoing tobacco use disorder with COPD, excess alcohol drinking, PUD with UGIB on 09/21/2021. Eliquis restarted in OP basis.   She also presented with left central retinal artery occlusion and acute left-sided vision loss and underwent left carotid endarterectomy on 08/04/2021 at [facility].  1. Paroxysmal atrial fibrillation [facility]) Patient is presently maintaining sinus rhythm.  I will discontinue digoxin in view of severe LVH and hyperdynamic LVEF with intraventricular pressure gradient.  I will add diltiazem CD1 20 mg daily along with continued metoprolol.  2. Primary hypertension Blood pressure is under excellent control, continue present medications.  3. History of left-sided carotid endarterectomy 08/14/2021 Recent follow-up carotid artery duplex in September had revealed patent left ICA site however velocity was in the 50% stenosis range.  We will continue 6 monthly surveillance, presently being followed at [facility].  She also has developed left eye vision loss and has an appointment to see them as well.  4. Mixed hyperlipidemia On her last office visit patient did not want to be back on Repatha, however after my visit, she has restarted Repatha.  Although LDL is elevated, suspect LDL will normalize, she will need repeat lipid profile testing at some point in 4 to 6 weeks.  Since last office visit after my discussions, she is completely quit smoking cigarettes, has completely quit drinking alcohol and has been compliant with taking all her medications.  Office visit in 3 months.   Adrian Prows, MD, [facility] 11/30/2021, 10:14 AM Office: [phone] Fax: [phone] Pager: [phone]

## 2021-12-02 ENCOUNTER — Ambulatory Visit: Payer: Medicare Other | Admitting: Cardiology

## 2021-12-12 ENCOUNTER — Emergency Department (HOSPITAL_COMMUNITY): Payer: Medicare Other

## 2021-12-12 ENCOUNTER — Other Ambulatory Visit: Payer: Self-pay

## 2021-12-12 ENCOUNTER — Inpatient Hospital Stay (HOSPITAL_COMMUNITY)
Admission: EM | Admit: 2021-12-12 | Discharge: 2021-12-13 | DRG: 871 | Discharge status: Against Medical Advice | Payer: Medicare Other | Attending: Internal Medicine | Admitting: Internal Medicine

## 2021-12-12 ENCOUNTER — Encounter (HOSPITAL_COMMUNITY): Payer: Self-pay | Admitting: Internal Medicine

## 2021-12-12 DIAGNOSIS — Z803 Family history of malignant neoplasm of breast: Secondary | ICD-10-CM | POA: Diagnosis not present

## 2021-12-12 DIAGNOSIS — Z9981 Dependence on supplemental oxygen: Secondary | ICD-10-CM

## 2021-12-12 DIAGNOSIS — U071 COVID-19: Secondary | ICD-10-CM

## 2021-12-12 DIAGNOSIS — Z811 Family history of alcohol abuse and dependence: Secondary | ICD-10-CM

## 2021-12-12 DIAGNOSIS — E039 Hypothyroidism, unspecified: Secondary | ICD-10-CM | POA: Diagnosis present

## 2021-12-12 DIAGNOSIS — Z7951 Long term (current) use of inhaled steroids: Secondary | ICD-10-CM

## 2021-12-12 DIAGNOSIS — Z5329 Procedure and treatment not carried out because of patient's decision for other reasons: Secondary | ICD-10-CM | POA: Diagnosis present

## 2021-12-12 DIAGNOSIS — R636 Underweight: Secondary | ICD-10-CM | POA: Diagnosis present

## 2021-12-12 DIAGNOSIS — Z79899 Other long term (current) drug therapy: Secondary | ICD-10-CM | POA: Diagnosis not present

## 2021-12-12 DIAGNOSIS — F419 Anxiety disorder, unspecified: Secondary | ICD-10-CM | POA: Diagnosis present

## 2021-12-12 DIAGNOSIS — Z800 Family history of malignant neoplasm of digestive organs: Secondary | ICD-10-CM

## 2021-12-12 DIAGNOSIS — I480 Paroxysmal atrial fibrillation: Secondary | ICD-10-CM | POA: Diagnosis present

## 2021-12-12 DIAGNOSIS — R0902 Hypoxemia: Secondary | ICD-10-CM | POA: Diagnosis not present

## 2021-12-12 DIAGNOSIS — D649 Anemia, unspecified: Secondary | ICD-10-CM | POA: Diagnosis present

## 2021-12-12 DIAGNOSIS — I5033 Acute on chronic diastolic (congestive) heart failure: Secondary | ICD-10-CM | POA: Diagnosis present

## 2021-12-12 DIAGNOSIS — I10 Essential (primary) hypertension: Secondary | ICD-10-CM | POA: Diagnosis not present

## 2021-12-12 DIAGNOSIS — R0600 Dyspnea, unspecified: Secondary | ICD-10-CM

## 2021-12-12 DIAGNOSIS — Z681 Body mass index (BMI) 19 or less, adult: Secondary | ICD-10-CM

## 2021-12-12 DIAGNOSIS — E782 Mixed hyperlipidemia: Secondary | ICD-10-CM | POA: Diagnosis present

## 2021-12-12 DIAGNOSIS — Z8249 Family history of ischemic heart disease and other diseases of the circulatory system: Secondary | ICD-10-CM

## 2021-12-12 DIAGNOSIS — Z7901 Long term (current) use of anticoagulants: Secondary | ICD-10-CM

## 2021-12-12 DIAGNOSIS — J449 Chronic obstructive pulmonary disease, unspecified: Secondary | ICD-10-CM | POA: Diagnosis present

## 2021-12-12 DIAGNOSIS — J9621 Acute and chronic respiratory failure with hypoxia: Secondary | ICD-10-CM | POA: Diagnosis present

## 2021-12-12 DIAGNOSIS — A4189 Other specified sepsis: Secondary | ICD-10-CM | POA: Diagnosis present

## 2021-12-12 DIAGNOSIS — I110 Hypertensive heart disease with heart failure: Secondary | ICD-10-CM | POA: Diagnosis present

## 2021-12-12 DIAGNOSIS — Z87891 Personal history of nicotine dependence: Secondary | ICD-10-CM

## 2021-12-12 DIAGNOSIS — H4052X0 Glaucoma secondary to other eye disorders, left eye, stage unspecified: Secondary | ICD-10-CM | POA: Diagnosis present

## 2021-12-12 DIAGNOSIS — J9601 Acute respiratory failure with hypoxia: Secondary | ICD-10-CM | POA: Diagnosis present

## 2021-12-12 DIAGNOSIS — F17200 Nicotine dependence, unspecified, uncomplicated: Secondary | ICD-10-CM | POA: Diagnosis present

## 2021-12-12 HISTORY — DX: COVID-19: U07.1

## 2021-12-12 LAB — I-STAT ARTERIAL BLOOD GAS, ED
Acid-base deficit: 7 mmol/L — ABNORMAL HIGH (ref 0.0–2.0)
Bicarbonate: 20.4 mmol/L (ref 20.0–28.0)
Calcium, Ion: 1.22 mmol/L (ref 1.15–1.40)
HCT: 30 % — ABNORMAL LOW (ref 36.0–46.0)
Hemoglobin: 10.2 g/dL — ABNORMAL LOW (ref 12.0–15.0)
O2 Saturation: 92 %
Potassium: 4.1 mmol/L (ref 3.5–5.1)
Sodium: 139 mmol/L (ref 135–145)
TCO2: 22 mmol/L (ref 22–32)
pCO2 arterial: 47.8 mmHg (ref 32–48)
pH, Arterial: 7.239 — ABNORMAL LOW (ref 7.35–7.45)
pO2, Arterial: 75 mmHg — ABNORMAL LOW (ref 83–108)

## 2021-12-12 LAB — CBC WITH DIFFERENTIAL/PLATELET
Abs Immature Granulocytes: 0.1 10*3/uL — ABNORMAL HIGH (ref 0.00–0.07)
Basophils Absolute: 0 10*3/uL (ref 0.0–0.1)
Basophils Relative: 0 %
Eosinophils Absolute: 0.1 10*3/uL (ref 0.0–0.5)
Eosinophils Relative: 1 %
HCT: 30.5 % — ABNORMAL LOW (ref 36.0–46.0)
Hemoglobin: 9.8 g/dL — ABNORMAL LOW (ref 12.0–15.0)
Immature Granulocytes: 1 %
Lymphocytes Relative: 7 %
Lymphs Abs: 0.6 10*3/uL — ABNORMAL LOW (ref 0.7–4.0)
MCH: 31.5 pg (ref 26.0–34.0)
MCHC: 32.1 g/dL (ref 30.0–36.0)
MCV: 98.1 fL (ref 80.0–100.0)
Monocytes Absolute: 0.6 10*3/uL (ref 0.1–1.0)
Monocytes Relative: 7 %
Neutro Abs: 7.6 10*3/uL (ref 1.7–7.7)
Neutrophils Relative %: 84 %
Platelets: 176 10*3/uL (ref 150–400)
RBC: 3.11 MIL/uL — ABNORMAL LOW (ref 3.87–5.11)
RDW: 14.3 % (ref 11.5–15.5)
WBC: 9 10*3/uL (ref 4.0–10.5)
nRBC: 0 % (ref 0.0–0.2)

## 2021-12-12 LAB — RESP PANEL BY RT-PCR (FLU A&B, COVID) ARPGX2
Influenza A by PCR: NEGATIVE
Influenza B by PCR: NEGATIVE
SARS Coronavirus 2 by RT PCR: POSITIVE — AB

## 2021-12-12 LAB — URINALYSIS, ROUTINE W REFLEX MICROSCOPIC
Bilirubin Urine: NEGATIVE
Glucose, UA: NEGATIVE mg/dL
Hgb urine dipstick: NEGATIVE
Ketones, ur: NEGATIVE mg/dL
Leukocytes,Ua: NEGATIVE
Nitrite: NEGATIVE
Protein, ur: NEGATIVE mg/dL
Specific Gravity, Urine: 1.009 (ref 1.005–1.030)
pH: 5 (ref 5.0–8.0)

## 2021-12-12 LAB — COMPREHENSIVE METABOLIC PANEL
ALT: 30 U/L (ref 0–44)
AST: 51 U/L — ABNORMAL HIGH (ref 15–41)
Albumin: 3.6 g/dL (ref 3.5–5.0)
Alkaline Phosphatase: 55 U/L (ref 38–126)
Anion gap: 10 (ref 5–15)
BUN: 15 mg/dL (ref 8–23)
CO2: 20 mmol/L — ABNORMAL LOW (ref 22–32)
Calcium: 8.6 mg/dL — ABNORMAL LOW (ref 8.9–10.3)
Chloride: 108 mmol/L (ref 98–111)
Creatinine, Ser: 1.06 mg/dL — ABNORMAL HIGH (ref 0.44–1.00)
GFR, Estimated: 53 mL/min — ABNORMAL LOW (ref >60)
Glucose, Bld: 165 mg/dL — ABNORMAL HIGH (ref 70–99)
Potassium: 4.1 mmol/L (ref 3.5–5.1)
Sodium: 138 mmol/L (ref 135–145)
Total Bilirubin: 0.4 mg/dL (ref 0.3–1.2)
Total Protein: 6.2 g/dL — ABNORMAL LOW (ref 6.5–8.1)

## 2021-12-12 LAB — BRAIN NATRIURETIC PEPTIDE: B Natriuretic Peptide: 943 pg/mL — ABNORMAL HIGH (ref 0.0–100.0)

## 2021-12-12 LAB — APTT: aPTT: 36 seconds (ref 24–36)

## 2021-12-12 LAB — PROTIME-INR
INR: 1.1 (ref 0.8–1.2)
Prothrombin Time: 14.3 seconds (ref 11.4–15.2)

## 2021-12-12 LAB — TROPONIN I (HIGH SENSITIVITY): Troponin I (High Sensitivity): 73 ng/L — ABNORMAL HIGH (ref <18)

## 2021-12-12 LAB — LACTIC ACID, PLASMA: Lactic Acid, Venous: 1.6 mmol/L (ref 0.5–1.9)

## 2021-12-12 MED ORDER — PREDNISOLONE ACETATE 1 % OP SUSP
1.0000 [drp] | Freq: Every day | OPHTHALMIC | Status: DC
  Administered 2021-12-12 – 2021-12-13 (×5): 1 [drp] via OPHTHALMIC
  Filled 2021-12-12: qty 5

## 2021-12-12 MED ORDER — DULOXETINE HCL 30 MG PO CPEP
60.0000 mg | ORAL_CAPSULE | Freq: Every day | ORAL | Status: DC
  Administered 2021-12-13: 60 mg via ORAL
  Filled 2021-12-12: qty 2

## 2021-12-12 MED ORDER — LATANOPROST 0.005 % OP SOLN
1.0000 [drp] | Freq: Every day | OPHTHALMIC | Status: DC
  Administered 2021-12-12: 1 [drp] via OPHTHALMIC
  Filled 2021-12-12: qty 2.5

## 2021-12-12 MED ORDER — POLYVINYL ALCOHOL 1.4 % OP SOLN: 1.0000 [drp] | Freq: Three times a day (TID) | OPHTHALMIC | Status: DC | PRN

## 2021-12-12 MED ORDER — DILTIAZEM HCL 25 MG/5ML IV SOLN: 5.0000 mg | Freq: Once | INTRAVENOUS | Status: DC

## 2021-12-12 MED ORDER — TRAZODONE HCL 50 MG PO TABS
100.0000 mg | ORAL_TABLET | Freq: Every evening | ORAL | Status: DC | PRN
  Administered 2021-12-12: 100 mg via ORAL
  Filled 2021-12-12: qty 2

## 2021-12-12 MED ORDER — ACETAMINOPHEN 500 MG PO TABS
1000.0000 mg | ORAL_TABLET | Freq: Once | ORAL | Status: AC
  Administered 2021-12-12: 1000 mg via ORAL
  Filled 2021-12-12: qty 2

## 2021-12-12 MED ORDER — ONDANSETRON HCL 4 MG/2ML IJ SOLN: 4.0000 mg | Freq: Four times a day (QID) | INTRAMUSCULAR | Status: DC | PRN

## 2021-12-12 MED ORDER — LACTATED RINGERS IV BOLUS (SEPSIS)
1000.0000 mL | Freq: Once | INTRAVENOUS | Status: AC
  Administered 2021-12-12: 1000 mL via INTRAVENOUS

## 2021-12-12 MED ORDER — LORAZEPAM 2 MG/ML IJ SOLN
0.5000 mg | Freq: Once | INTRAMUSCULAR | Status: AC
  Administered 2021-12-12: 0.5 mg via INTRAVENOUS

## 2021-12-12 MED ORDER — FLUTICASONE FUROATE-VILANTEROL 100-25 MCG/ACT IN AEPB
1.0000 | INHALATION_SPRAY | Freq: Every day | RESPIRATORY_TRACT | Status: DC
  Administered 2021-12-13: 1 via RESPIRATORY_TRACT
  Filled 2021-12-12: qty 28

## 2021-12-12 MED ORDER — HYDRALAZINE HCL 20 MG/ML IJ SOLN: 5.0000 mg | INTRAMUSCULAR | Status: DC | PRN

## 2021-12-12 MED ORDER — ACETAMINOPHEN 325 MG PO TABS: 650.0000 mg | ORAL_TABLET | Freq: Four times a day (QID) | ORAL | Status: DC | PRN

## 2021-12-12 MED ORDER — LORAZEPAM 1 MG PO TABS: 0.5000 mg | ORAL_TABLET | Freq: Once | ORAL | Status: DC

## 2021-12-12 MED ORDER — LORAZEPAM 2 MG/ML IJ SOLN
INTRAMUSCULAR | Status: AC
  Filled 2021-12-12: qty 1

## 2021-12-12 MED ORDER — IPRATROPIUM-ALBUTEROL 0.5-2.5 (3) MG/3ML IN SOLN
3.0000 mL | RESPIRATORY_TRACT | Status: DC
  Administered 2021-12-12 (×2): 3 mL via RESPIRATORY_TRACT
  Filled 2021-12-12 (×2): qty 3

## 2021-12-12 MED ORDER — DILTIAZEM HCL-DEXTROSE 125-5 MG/125ML-% IV SOLN (PREMIX)
5.0000 mg/h | INTRAVENOUS | Status: DC
  Administered 2021-12-12: 5 mg/h via INTRAVENOUS
  Filled 2021-12-12: qty 125

## 2021-12-12 MED ORDER — FUROSEMIDE 10 MG/ML IJ SOLN
20.0000 mg | Freq: Once | INTRAMUSCULAR | Status: AC
  Administered 2021-12-12: 20 mg via INTRAVENOUS
  Filled 2021-12-12: qty 2

## 2021-12-12 MED ORDER — UMECLIDINIUM BROMIDE 62.5 MCG/ACT IN AEPB
1.0000 | INHALATION_SPRAY | Freq: Every day | RESPIRATORY_TRACT | Status: DC
  Administered 2021-12-13: 1 via RESPIRATORY_TRACT
  Filled 2021-12-12: qty 7

## 2021-12-12 MED ORDER — PREDNISONE 5 MG PO TABS: 50.0000 mg | ORAL_TABLET | Freq: Every day | ORAL | Status: DC

## 2021-12-12 MED ORDER — DORZOLAMIDE HCL 2 % OP SOLN
1.0000 [drp] | Freq: Three times a day (TID) | OPHTHALMIC | Status: DC
  Administered 2021-12-12 – 2021-12-13 (×2): 1 [drp] via OPHTHALMIC
  Filled 2021-12-12: qty 10

## 2021-12-12 MED ORDER — NICOTINE 14 MG/24HR TD PT24
14.0000 mg | MEDICATED_PATCH | Freq: Every day | TRANSDERMAL | Status: DC
  Administered 2021-12-12 – 2021-12-13 (×2): 14 mg via TRANSDERMAL
  Filled 2021-12-12 (×2): qty 1

## 2021-12-12 MED ORDER — APIXABAN 2.5 MG PO TABS
2.5000 mg | ORAL_TABLET | Freq: Two times a day (BID) | ORAL | Status: DC
  Administered 2021-12-12 (×2): 2.5 mg via ORAL
  Filled 2021-12-12 (×2): qty 1

## 2021-12-12 MED ORDER — SODIUM CHLORIDE 0.9 % IV SOLN
2.0000 g | INTRAVENOUS | Status: DC
  Administered 2021-12-12 – 2021-12-13 (×2): 2 g via INTRAVENOUS
  Filled 2021-12-12 (×2): qty 20

## 2021-12-12 MED ORDER — ONDANSETRON HCL 4 MG PO TABS: 4.0000 mg | ORAL_TABLET | Freq: Four times a day (QID) | ORAL | Status: DC | PRN

## 2021-12-12 MED ORDER — PANTOPRAZOLE SODIUM 40 MG PO TBEC
40.0000 mg | DELAYED_RELEASE_TABLET | Freq: Two times a day (BID) | ORAL | Status: DC
  Administered 2021-12-12 – 2021-12-13 (×2): 40 mg via ORAL
  Filled 2021-12-12 (×2): qty 1

## 2021-12-12 MED ORDER — HYDROXYZINE HCL 10 MG PO TABS
10.0000 mg | ORAL_TABLET | Freq: Every day | ORAL | Status: DC | PRN
  Administered 2021-12-12: 10 mg via ORAL
  Filled 2021-12-12: qty 1

## 2021-12-12 MED ORDER — DILTIAZEM LOAD VIA INFUSION
10.0000 mg | Freq: Once | INTRAVENOUS | Status: AC
  Administered 2021-12-12: 10 mg via INTRAVENOUS
  Filled 2021-12-12: qty 10

## 2021-12-12 MED ORDER — NIRMATRELVIR/RITONAVIR (PAXLOVID)TABLET: 3.0000 | ORAL_TABLET | Freq: Two times a day (BID) | ORAL | Status: DC

## 2021-12-12 MED ORDER — CARBOXYMETHYLCELLULOSE SODIUM 0.5 % OP SOLN: 1.0000 [drp] | Freq: Three times a day (TID) | OPHTHALMIC | Status: DC | PRN

## 2021-12-12 MED ORDER — LACTATED RINGERS IV SOLN: INTRAVENOUS | Status: DC

## 2021-12-12 MED ORDER — PREGABALIN 25 MG PO CAPS
75.0000 mg | ORAL_CAPSULE | Freq: Two times a day (BID) | ORAL | Status: DC
  Administered 2021-12-12 – 2021-12-13 (×3): 75 mg via ORAL
  Filled 2021-12-12 (×3): qty 3

## 2021-12-12 MED ORDER — GUAIFENESIN-DM 100-10 MG/5ML PO SYRP: 10.0000 mL | ORAL_SOLUTION | ORAL | Status: DC | PRN

## 2021-12-12 MED ORDER — SODIUM CHLORIDE 0.9 % IV SOLN
500.0000 mg | INTRAVENOUS | Status: DC
  Administered 2021-12-12 – 2021-12-13 (×2): 500 mg via INTRAVENOUS
  Filled 2021-12-12 (×2): qty 5

## 2021-12-12 MED ORDER — SODIUM CHLORIDE 0.9% FLUSH
3.0000 mL | Freq: Two times a day (BID) | INTRAVENOUS | Status: DC
  Administered 2021-12-12 (×2): 3 mL via INTRAVENOUS

## 2021-12-12 MED ORDER — NITROGLYCERIN IN D5W 200-5 MCG/ML-% IV SOLN: 0.0000 ug/min | INTRAVENOUS | Status: DC

## 2021-12-12 MED ORDER — FUROSEMIDE 10 MG/ML IJ SOLN
40.0000 mg | Freq: Two times a day (BID) | INTRAMUSCULAR | Status: DC
  Administered 2021-12-12 – 2021-12-13 (×2): 40 mg via INTRAVENOUS
  Filled 2021-12-12 (×2): qty 4

## 2021-12-12 MED ORDER — MOLNUPIRAVIR EUA 200MG CAPSULE
4.0000 | ORAL_CAPSULE | Freq: Two times a day (BID) | ORAL | Status: DC
  Administered 2021-12-12 – 2021-12-13 (×3): 800 mg via ORAL
  Filled 2021-12-12: qty 4

## 2021-12-12 MED ORDER — APRACLONIDINE HCL 1 % OP SOLN
1.0000 [drp] | Freq: Three times a day (TID) | OPHTHALMIC | Status: DC
  Administered 2021-12-12 – 2021-12-13 (×2): 1 [drp] via OPHTHALMIC
  Filled 2021-12-12: qty 2.4

## 2021-12-12 MED ORDER — APRACLONIDINE HCL 0.5 % OP SOLN
1.0000 [drp] | Freq: Three times a day (TID) | OPHTHALMIC | Status: DC
  Filled 2021-12-12: qty 5

## 2021-12-12 MED ORDER — ACETAMINOPHEN 650 MG RE SUPP: 650.0000 mg | Freq: Four times a day (QID) | RECTAL | Status: DC | PRN

## 2021-12-12 MED ORDER — HYDROCOD POLI-CHLORPHE POLI ER 10-8 MG/5ML PO SUER: 5.0000 mL | Freq: Two times a day (BID) | ORAL | Status: DC | PRN

## 2021-12-12 MED ORDER — ROSUVASTATIN CALCIUM 20 MG PO TABS
40.0000 mg | ORAL_TABLET | Freq: Every day | ORAL | Status: DC
  Administered 2021-12-13: 40 mg via ORAL
  Filled 2021-12-12: qty 2

## 2021-12-12 MED ORDER — METHYLPREDNISOLONE SODIUM SUCC 125 MG IJ SOLR
80.0000 mg | INTRAMUSCULAR | Status: DC
  Administered 2021-12-12 – 2021-12-13 (×2): 80 mg via INTRAVENOUS
  Filled 2021-12-12 (×2): qty 2

## 2021-12-12 MED ORDER — METHYLPREDNISOLONE SODIUM SUCC 40 MG IJ SOLR: 1.0000 mg/kg | Freq: Two times a day (BID) | INTRAMUSCULAR | Status: DC

## 2021-12-12 NOTE — ED Triage Notes (Addendum)
Pt in from home via [facility] for sob, sats were 80% on 2L home O2. Arrives a&ox4, sats 98% on 15L NRB, given '125mg'$  Solumedrol enroute. temp 101.2 for EMS VS en route: 148/74 90-130 afib 80% on home 2LNC CBG 102

## 2021-12-12 NOTE — ED Provider Notes (Signed)
[facility] EMERGENCY DEPARTMENT Provider Note   CSN: [id number] Arrival date & time: 12/12/21  6945     History  Chief Complaint  Patient presents with   Shortness of Breath    Kelly [name] is a 81 y.o. female.  81 year old female who presents the ER today for dyspnea.  Patient states that she does not normally wear oxygen but has not as needed at home.  She states that a couple days ago she started having worsening cough and shortness of breath.  Not productive.  She started wearing oxygen which seemed to make it better.  Tonight it got worse again so she called EMS.  EMS gave her steroids and brought her here with no other interventions.  Initially had a nonrebreather as she was 80% on 2 L reportedly.  Prior to my evaluation she is back on the 2 L and satting in the mid 90s to low 90s with tachypnea but no distress.  She denies any chest pain right now.  No recent illnesses.  No known sick contacts.  No lower extremity swelling.   Shortness of Breath      Home Medications Prior to Admission medications   Medication Sig Start Date End Date Taking? Authorizing Provider  albuterol (VENTOLIN HFA) 108 (90 Base) MCG/ACT inhaler Inhale 2 puffs into the lungs every 6 (six) hours as needed for wheezing or shortness of breath. 03/18/21   Maryjane Hurter, MD  carboxymethylcellulose (REFRESH PLUS) 0.5 % SOLN Place 1 drop into both eyes 3 (three) times daily as needed (dry eyes).    [provider]  diltiazem (CARDIZEM CD) 120 MG 24 hr capsule Take 1 capsule (120 mg total) by mouth daily. 11/30/21 11/25/22  Adrian Prows, MD  dorzolamide (TRUSOPT) 2 % ophthalmic solution Place 1 drop into the left eye 3 (three) times daily. 08/23/21   [provider]  DULoxetine (CYMBALTA) 60 MG capsule Take 60 mg by mouth daily. 08/19/20   [provider]  ELIQUIS 2.5 MG TABS tablet TAKE 1 TABLET(2.5 MG) BY MOUTH TWICE DAILY Patient taking differently: Take 2.5 mg by  mouth 2 (two) times daily. 08/19/21   Adrian Prows, MD  EPINEPHrine 0.3 mg/0.3 mL IJ SOAJ injection Inject 0.3 mg into the muscle as needed for anaphylaxis.    [provider]  ferrous sulfate 325 (65 FE) MG EC tablet Take 1 tablet (325 mg total) by mouth daily with breakfast. 08/02/16   Martinique, Betty G, MD  Fluticasone-Umeclidin-Vilant (TRELEGY ELLIPTA) 100-62.5-25 MCG/ACT AEPB Inhale 1 puff into the lungs daily. 03/18/21   Maryjane Hurter, MD  folic acid (FOLVITE) 1 MG tablet Take 1 mg by mouth daily.    [provider]  hydrOXYzine (ATARAX) 10 MG tablet Take 10 mg by mouth daily as needed for anxiety. 08/12/21   [provider]  latanoprost (XALATAN) 0.005 % ophthalmic solution Place 1 drop into the left eye at bedtime. 08/31/21   [provider]  metoprolol succinate (TOPROL-XL) 25 MG 24 hr tablet Take 1 tablet (25 mg total) by mouth daily. 09/24/21   Rick Duff, MD  nicotine (NICODERM CQ - DOSED IN MG/24 HOURS) 21 mg/24hr patch Place 1 patch (21 mg total) onto the skin daily. Patient taking differently: Place 14 mg onto the skin daily. 06/04/20   Adrian Prows, MD  ondansetron (ZOFRAN) 4 MG tablet Take 1 tablet (4 mg total) by mouth every 8 (eight) hours as needed for nausea or vomiting. 02/28/21 02/28/22  Pokhrel,  Laxman, MD  OXYGEN Inhale 1 L into the lungs daily as needed (shortness of breath).    [provider]  pantoprazole (PROTONIX) 40 MG tablet Take 1 tablet (40 mg total) by mouth 2 (two) times daily. 09/23/21   Rick Duff, MD  pregabalin (LYRICA) 75 MG capsule Take 75 mg by mouth 2 (two) times daily. 08/19/21   [provider]  REPATHA 140 MG/ML SOSY INJECT 1 ML UNDER THE SKIN EVERY 14 DAYS AS DIRECTED 05/11/21   Cantwell, Celeste C, PA-C  rosuvastatin (CRESTOR) 40 MG tablet Take 40 mg by mouth daily. 07/29/21   [provider]  Spacer/Aero-Holding Chambers (VORTEX VALVED HOLDING CHAMBER) DEVI by Does not apply route.     [provider]  traZODone (DESYREL) 100 MG tablet Take 100 mg by mouth at bedtime as needed for sleep. 08/25/21   [provider]      Allergies    Bactrim [sulfamethoxazole-trimethoprim] and Bee venom    Review of Systems   Review of Systems  Respiratory:  Positive for shortness of breath.     Physical Exam Updated Vital Signs BP (!) 105/53   Pulse (!) 113   Temp (!) 100.7 F (38.2 C)   Resp (!) 30   Wt 38.1 kg   SpO2 92%   BMI 15.87 kg/m  Physical Exam Vitals and nursing note reviewed.  Constitutional:      General: She is not in acute distress.    Appearance: She is well-developed. She is not diaphoretic.  HENT:     Head: Normocephalic and atraumatic.  Cardiovascular:     Rate and Rhythm: Normal rate and regular rhythm.  Pulmonary:     Effort: Tachypnea present. No respiratory distress.     Breath sounds: No stridor. Decreased breath sounds and wheezing present.  Chest:     Chest wall: No mass.  Abdominal:     General: There is no distension.  Musculoskeletal:     Cervical back: Normal range of motion.     Right lower leg: No edema.     Left lower leg: No edema.  Skin:    General: Skin is warm and dry.  Neurological:     General: No focal deficit present.     Mental Status: She is alert.     ED Results / Procedures / Treatments   Labs (all labs ordered are listed, but only abnormal results are displayed) Labs Reviewed  CBC WITH DIFFERENTIAL/PLATELET - Abnormal; Notable for the following components:      Result Value   RBC 3.11 (*)    Hemoglobin 9.8 (*)    HCT 30.5 (*)    Lymphs Abs 0.6 (*)    Abs Immature Granulocytes 0.10 (*)    All other components within normal limits  RESP PANEL BY RT-PCR (FLU A&B, COVID) ARPGX2  CULTURE, BLOOD (ROUTINE X 2)  CULTURE, BLOOD (ROUTINE X 2)  URINE CULTURE  SARS CORONAVIRUS 2 BY RT PCR  PROTIME-INR  APTT  LACTIC ACID, PLASMA  LACTIC ACID, PLASMA  COMPREHENSIVE METABOLIC PANEL  URINALYSIS,  ROUTINE W REFLEX MICROSCOPIC  BRAIN NATRIURETIC PEPTIDE  TROPONIN I (HIGH SENSITIVITY)    EKG EKG Interpretation  Date/Time:  Sunday December 12 2021 03:41:30 EST Ventricular Rate:  113 PR Interval:  182 QRS Duration: 86 QT Interval:  314 QTC Calculation: 430 R Axis:   64 Text Interpretation: Sinus tachycardia Minimal voltage criteria for LVH, may be normal variant ( Sokolow-Lyon ) ST & Marked T-wave abnormality,  consider inferolateral ischemia Abnormal ECG When compared with ECG of 21-Sep-2021 02:13, PREVIOUS ECG IS PRESENT  given the LVH, overall similar to september aside from lead 1 Confirmed by Merrily Pew [phone]) on 12/12/2021 4:15:29 AM  Radiology No results found.  Procedures .Critical Care  Performed by: Merrily Pew, MD Authorized by: Merrily Pew, MD   Critical care provider statement:    Critical care time (minutes):  30   Critical care was necessary to treat or prevent imminent or life-threatening deterioration of the following conditions:  Sepsis and respiratory failure   Critical care was time spent personally by me on the following activities:  Development of treatment plan with patient or surrogate, discussions with consultants, evaluation of patient's response to treatment, examination of patient, ordering and review of laboratory studies, ordering and review of radiographic studies, ordering and performing treatments and interventions, pulse oximetry, re-evaluation of patient's condition and review of old charts     Medications Ordered in ED Medications  lactated ringers infusion (has no administration in time range)  lactated ringers bolus 1,000 mL (has no administration in time range)  cefTRIAXone (ROCEPHIN) 2 g in sodium chloride 0.9 % 100 mL IVPB (2 g Intravenous New Bag/Given 12/12/21 0419)  azithromycin (ZITHROMAX) 500 mg in sodium chloride 0.9 % 250 mL IVPB (500 mg Intravenous New Bag/Given 12/12/21 0425)  ipratropium-albuterol (DUONEB) 0.5-2.5 (3)  MG/3ML nebulizer solution 3 mL (3 mLs Nebulization Given 12/12/21 0418)  acetaminophen (TYLENOL) tablet 1,000 mg (1,000 mg Oral Given 12/12/21 0427)    ED Course/ Medical Decision Making/ A&P                           Medical Decision Making Amount and/or Complexity of Data Reviewed Labs: ordered. Radiology: ordered. ECG/medicine tests: ordered.  Risk OTC drugs. Prescription drug management. Decision regarding hospitalization.   CXR done and showed diffuse opacities but seems more prominent in RLL, pending rads read (independently viewed and interpreted by myself and radiology read reviewed).  Febrile, tachycardic, tachypneic and hypoxic. Sepsis called. Suspect likely CAP. Albuterol will be given for help with wheezing with history of COPD. Will give fluids slowly given history of heart failure without confirmation of severe sepsis or shock. Abx started for suspected resp source.   Patietn with worsening resp symptoms, HR higher, BP higher. Suspect the fluids along with albuterol related sympathetic tachy causing her to go into pulmonary edema as her bnp came back elevated and her covid positive (explaining the fever) rather than bacterial pneumonia. Started bipap/dilt infusion. Covid treatment initiated, already had solumedrol with EMS.   Final Clinical Impression(s) / ED Diagnoses Final diagnoses:  None    Rx / DC Orders ED Discharge Orders     None         Mana Morison, Corene Cornea, MD 12/12/21 2307

## 2021-12-12 NOTE — ED Notes (Signed)
Afib with RVR MD notified. EKG completed. Respiratory notified. Bipap started. Stopped IV fluid bolus. Gave lasix, cardizem bolus and drip. Patient anxious but consolable. Erling Conte, RN

## 2021-12-12 NOTE — Sepsis Progress Note (Signed)
Elink following code sepsis °

## 2021-12-12 NOTE — H&P (Signed)
History and Physical    Patient: Kelly [name] MGQ:[id number] DOB: Jun 02, 1940 DOA: 12/12/2021 DOS: the patient was seen and examined on 12/12/2021 PCP: Joya Gaskins, FNP  Patient coming from: Home - lives with daughter and SIL; NOK: Daughter, Virgilio Frees, [phone]   Chief Complaint: SOB  HPI: Kelly [name] is a 81 y.o. female with medical history significant of COPD on 2L home O2; afib; chronic pain; neovascular glaucoma; Crohn's' depression; HTN; and hypothyroidism presenting with SOB.   She has had worsening cough and SOB for the last few days, but it became acutely worse overnight.  Her SIL has been sick but it didn't occur to her that she might have COVID.  On presentation, she was 80% on 2L [place] O2 and so was placed on NRB.  She was returned to [place] O2 and treated for sepsis from PNA.  She developed afib with RVR and there was concern about CHF.  She was started on Dilt drip and placed on BIPAP.  At the time of my evaluation, she was improved on BIPAP and able to converse with the mask.  She subsequently was able to come off the BIPAP.  She also developed hypotension on Dilt and this was stopped with normalization of BP and HR.    ER Course:  On Dilt and BIPAP for pulmonary edema.  Days of worsening SOB, dry cough despite home O2.  Significantly worse overnight.  93% on 2L, RR 30.  Called Code Sepsis, gave some fluid.  CXR with ?RLL PNA and interstitial changes.  COVID +.  BNP 900.  Afib, HR 140s.  Started on BIPAP.  Will start NTG drip.     Review of Systems: As mentioned in the history of present illness. All other systems reviewed and are negative. Past Medical History:  Diagnosis Date   Anemia    Anxiety    Arthritis    B12 deficiency 05/2015   B12 was 184   Cancer (HCC)    CHF (congestive heart failure) (HCC)    COPD (chronic obstructive pulmonary disease) (HCC)    Crohn's disease, small intestine ([place]) 02/19/2009   Depression 10/2013   chronic recurrent  major depressive disorder   Dysrhythmia    H/O vitamin D deficiency 08/04/2008   Heart murmur    Hypertension    Hypothyroidism    Pneumonia    Recurrent dislocation of hip, right    Past Surgical History:  Procedure Laterality Date   BIOPSY  09/22/2021   Procedure: BIOPSY;  Surgeon: Mauri Pole, MD;  Location: [facility];  Service: Gastroenterology;;   CYSTOSCOPY WITH RETROGRADE PYELOGRAM, URETEROSCOPY AND STENT PLACEMENT Left 02/17/2021   Procedure: CYSTOSCOPY WITH  RETROGRADE PYELOGRAM, LEFT URETEROSCOPY , BIOPSY AND tumor ablation  STENT PLACEMENT;  Surgeon: Alexis Frock, MD;  Location: [facility];  Service: Urology;  Laterality: Left;   ESOPHAGOGASTRODUODENOSCOPY (EGD) WITH PROPOFOL N/A 09/22/2021   Procedure: ESOPHAGOGASTRODUODENOSCOPY (EGD) WITH PROPOFOL;  Surgeon: Mauri Pole, MD;  Location: [facility];  Service: Gastroenterology;  Laterality: N/A;   HOLMIUM LASER APPLICATION Left 09/24/3380   Procedure: HOLMIUM LASER APPLICATION;  Surgeon: Alexis Frock, MD;  Location: [facility];  Service: Urology;  Laterality: Left;   JOINT REPLACEMENT     Right hip, 2015   LAPAROSCOPY  06/03/2016   Duodenal ulcer repair   TONSILLECTOMY     TUBAL LIGATION     Social History:  reports that she quit smoking about 2 months ago. Her smoking use included cigarettes. She has a  58.00 pack-year smoking history. She has never used smokeless tobacco. She reports current alcohol use. She reports that she does not use drugs.  Allergies  Allergen Reactions   Bactrim [Sulfamethoxazole-Trimethoprim] Diarrhea and Nausea And Vomiting   Bee Venom Anaphylaxis    Family History  Problem Relation Age of Onset   Heart disease Mother    Alcohol abuse Mother    Colon cancer Sister    Breast cancer Sister    Heart attack Brother    Cancer Neg Hx    Diabetes Neg Hx     Prior to Admission medications   Medication Sig Start Date End Date Taking? Authorizing Provider  albuterol (VENTOLIN HFA)  108 (90 Base) MCG/ACT inhaler Inhale 2 puffs into the lungs every 6 (six) hours as needed for wheezing or shortness of breath. 03/18/21   Maryjane Hurter, MD  carboxymethylcellulose (REFRESH PLUS) 0.5 % SOLN Place 1 drop into both eyes 3 (three) times daily as needed (dry eyes).    [provider]  diltiazem (CARDIZEM CD) 120 MG 24 hr capsule Take 1 capsule (120 mg total) by mouth daily. 11/30/21 11/25/22  Adrian Prows, MD  dorzolamide (TRUSOPT) 2 % ophthalmic solution Place 1 drop into the left eye 3 (three) times daily. 08/23/21   [provider]  DULoxetine (CYMBALTA) 60 MG capsule Take 60 mg by mouth daily. 08/19/20   [provider]  ELIQUIS 2.5 MG TABS tablet TAKE 1 TABLET(2.5 MG) BY MOUTH TWICE DAILY Patient taking differently: Take 2.5 mg by mouth 2 (two) times daily. 08/19/21   Adrian Prows, MD  EPINEPHrine 0.3 mg/0.3 mL IJ SOAJ injection Inject 0.3 mg into the muscle as needed for anaphylaxis.    [provider]  ferrous sulfate 325 (65 FE) MG EC tablet Take 1 tablet (325 mg total) by mouth daily with breakfast. 08/02/16   Martinique, Betty G, MD  Fluticasone-Umeclidin-Vilant (TRELEGY ELLIPTA) 100-62.5-25 MCG/ACT AEPB Inhale 1 puff into the lungs daily. 03/18/21   Maryjane Hurter, MD  folic acid (FOLVITE) 1 MG tablet Take 1 mg by mouth daily.    [provider]  hydrOXYzine (ATARAX) 10 MG tablet Take 10 mg by mouth daily as needed for anxiety. 08/12/21   [provider]  latanoprost (XALATAN) 0.005 % ophthalmic solution Place 1 drop into the left eye at bedtime. 08/31/21   [provider]  metoprolol succinate (TOPROL-XL) 25 MG 24 hr tablet Take 1 tablet (25 mg total) by mouth daily. 09/24/21   Rick Duff, MD  nicotine (NICODERM CQ - DOSED IN MG/24 HOURS) 21 mg/24hr patch Place 1 patch (21 mg total) onto the skin daily. Patient taking differently: Place 14 mg onto the skin daily. 06/04/20   Adrian Prows, MD  ondansetron (ZOFRAN) 4 MG tablet  Take 1 tablet (4 mg total) by mouth every 8 (eight) hours as needed for nausea or vomiting. 02/28/21 02/28/22  Pokhrel, Corrie Mckusick, MD  OXYGEN Inhale 1 L into the lungs daily as needed (shortness of breath).    [provider]  pantoprazole (PROTONIX) 40 MG tablet Take 1 tablet (40 mg total) by mouth 2 (two) times daily. 09/23/21   Rick Duff, MD  pregabalin (LYRICA) 75 MG capsule Take 75 mg by mouth 2 (two) times daily. 08/19/21   [provider]  REPATHA 140 MG/ML SOSY INJECT 1 ML UNDER THE SKIN EVERY 14 DAYS AS DIRECTED 05/11/21   Cantwell, Celeste C, PA-C  rosuvastatin (CRESTOR) 40 MG tablet Take 40 mg by mouth  daily. 07/29/21   [provider]  Spacer/Aero-Holding Chambers (VORTEX VALVED HOLDING CHAMBER) DEVI by Does not apply route.    [provider]  traZODone (DESYREL) 100 MG tablet Take 100 mg by mouth at bedtime as needed for sleep. 08/25/21   [provider]    Physical Exam: Vitals:   12/12/21 1030 12/12/21 1100 12/12/21 1130 12/12/21 1200  BP: (!) 70/43 (!) 92/46 (!) 95/46 (!) 93/44  Pulse:  64 68 71  Resp:  17 (!) 28 18  Temp:      TempSrc:      SpO2:  97% 97% 99%  Weight:      Height:       General:  Appears ill but currently stable, on BIPAP this AM Eyes:  PERRL, EOMI, normal lids, iris ENT:  grossly normal hearing, lips; BIPAP in place Neck:  no LAD, masses or thyromegaly Cardiovascular:  Irregularly irregular with mild tachycardia, 2+ systolic murmur, no r/g. No LE edema.  Respiratory:   Scant scattered rhonchi.  Mildly increased respiratory effort on BIPAP. Abdomen:  soft, NT, ND Skin:  no rash or induration seen on limited exam Musculoskeletal:  grossly normal tone BUE/BLE, good ROM, no bony abnormality Psychiatric:  blunted mood and affect, speech appropriate but limited by BIPAP, AOx3 Neurologic:  CN 2-12 grossly intact, moves all extremities in coordinated fashion   Radiological Exams on Admission: Independently  reviewed - see discussion in A/P where applicable  DG Chest 2 View  Result Date: 12/12/2021 CLINICAL DATA:  Possible sepsis, shortness of breath. EXAM: CHEST - 2 VIEW COMPARISON:  09/21/2021. FINDINGS: Heart is enlarged and the mediastinal contour stable. Atherosclerotic calcification of the aorta is noted. The pulmonary vasculature is distended. Interstitial prominence is noted bilaterally and mild airspace disease is present at the lung bases. There are small bilateral pleural effusions. No pneumothorax. No acute osseous abnormality. IMPRESSION: 1. Cardiomegaly with pulmonary vascular congestion. 2. Interstitial prominence with mild airspace disease at the lung bases, possible edema or infiltrate. 3. Small bilateral pleural effusions. Electronically Signed   By: Brett Fairy M.D.   On: 12/12/2021 04:58    EKG: Independently reviewed.   0341 - Sinus tachycardia with rate 113; marked ST changes concerning for ischemia in V4-6 but similar to 11/14 0620 - Afib with rate 131; improved ST changes in V4-6   Labs on Admission: I have personally reviewed the available labs and imaging studies at the time of the admission.  Pertinent labs:    CO2 20 Glucose 165 BUN 15/Creatinine 1.06/GFR 53 AST 51 BNP 943; 1747.8 in 02/2021 HS troponin 73 Lactate 1.6 WBC 9 Hgb 9.8 INR 1.1 COVID POSITIVE Blood cultures x 2 pending ABG: 7.239/47.8/75/20.4 UA WNL   Assessment and Plan: Principal Problem:   COVID-19 virus infection Active Problems:   Tobacco use disorder   Chronic obstructive pulmonary disease (HCC)   Hypothyroidism, unspecified   Paroxysmal atrial fibrillation (HCC)   Mixed hyperlipidemia   Primary hypertension    COVID with sepsis physiology -Patient with presenting with SOB and cough, hypoxia on usual home O2 into the 80s and respiratory distress -She has a usual 2L home O2 requirement and is currently requiring 3L [place] O2, having weaned off BIPAP -COVID POSITIVE -The patient has  comorbidities which may increase the risk for ARDS/MODS including: age, HTN, COPD, CHF -Exam is concerning for development of ARDS/MODS due to respiratory distress -Pertinent labs concerning for COVID include lymphopenia; increased BUN/Creatinine -CXR with multifocal opacities which may be  c/w COVID vs. Multifocal PNA or edema -Will treat with broad-spectrum antibiotics given sepsis physiology at presentation -Will admit for further evaluation, close monitoring, and treatment -Monitor on telemetry x at least 24 hours -At this time, will attempt to avoid use of aerosolized medications and use HFAs instead -Will order steroids and molnupiravir -If the patient shows clinical deterioration, consider transfer to ICU with PCCM consultation -PT/OT consults -Encourage mobilization/ambulation as much as possible -Continue Trelegy  Afib -Her acute illness led her to go into RVR -She was started on IV Dilt and had improved HR -She is off the Dilt now with marginal BP and stabilized HR -Continue Eliquis  Chronic diastolic CHF -Echo in 05/275 with preserved EF -BNP is actually better than in February -Lower suspicion for acute exacerbation at this time -Cardiology has been consulted   Anxiety -Continue duloxetine, hydroxyzine, Lyrica, trazodone  HTN -Diltiazem and Toprol XL are on hold for now given marginal BPs  HLD -Continue rosuvastatin -Hold Repatha  Neovascular glaucoma -Continue dorzolamide, prednisolone  Hypothyroidism -Continue Synthroid  Tobacco dependence -She reports smoking a few cigs/month at this time -Ongoing cessation is encouraged -Nicotine patch continued     Advance Care Planning:   Code Status: Full Code   Consults: Cardiology; PT/OT; CHF navigator; TOC team  DVT Prophylaxis: Eliquis  Family Communication: None present; I spoke with her daughter by telephone following the admission  Severity of Illness: The appropriate patient status for this patient  is INPATIENT. Inpatient status is judged to be reasonable and necessary in order to provide the required intensity of service to ensure the patient's safety. The patient's presenting symptoms, physical exam findings, and initial radiographic and laboratory data in the context of their chronic comorbidities is felt to place them at high risk for further clinical deterioration. Furthermore, it is not anticipated that the patient will be medically stable for discharge from the hospital within 2 midnights of admission.   * I certify that at the point of admission it is my clinical judgment that the patient will require inpatient hospital care spanning beyond 2 midnights from the point of admission due to high intensity of service, high risk for further deterioration and high frequency of surveillance required.*  Author: Karmen Bongo, MD 12/12/2021 12:57 PM  For on call review [email or web address].

## 2021-12-12 NOTE — Consult Note (Signed)
CARDIOLOGY CONSULT NOTE  Patient ID: Kelly [name] MRN: [id number] DOB/AGE: 04/25/1940 81 y.o.  Admit date: 12/12/2021 Referring Physician  Dr Lorin Mercy Primary Physician:  Joya Gaskins, FNP Reason for Consultation  Afib RVR  Patient ID: Kelly [name], female    DOB: 04-27-1940, 81 y.o.   MRN: [id number]  Chief Complaint  Patient presents with   Shortness of Breath   HPI:    Kelly [name]  is a 81 y.o. female with paroxysmal atrial fibrillation on chronic Eliquis therapy, hypertension, hyperlipidemia, ongoing tobacco use disorder with COPD, excess alcohol drinking, PUD with UGIB on 09/21/2021 who presented to the hospital with shortness of breath. Patient noted to be in Afib RVR in addition she tested positive for acute covid infection. She was placed on BiPAP and diuresed. Her breathing improved significantly and her heart rate is now controlled after receiving cardizem. Patient states she felt like she was going to die last night but this morning she feels much better. She is breathing easier and not requiring BiPAP around the clock. She denies chest pain, diaphoresis, syncope, claudication.   Past Medical History:  Diagnosis Date   Anemia    Anxiety    Arthritis    B12 deficiency 05/2015   B12 was 184   Cancer (HCC)    CHF (congestive heart failure) (HCC)    COPD (chronic obstructive pulmonary disease) (HCC)    Crohn's disease, small intestine ([place]) 02/19/2009   Depression 10/2013   chronic recurrent major depressive disorder   Dysrhythmia    H/O vitamin D deficiency 08/04/2008   Heart murmur    Hypertension    Hypothyroidism    Pneumonia    Recurrent dislocation of hip, right    Past Surgical History:  Procedure Laterality Date   BIOPSY  09/22/2021   Procedure: BIOPSY;  Surgeon: Mauri Pole, MD;  Location: [facility];  Service: Gastroenterology;;   CYSTOSCOPY WITH RETROGRADE PYELOGRAM, URETEROSCOPY AND STENT PLACEMENT Left 02/17/2021   Procedure: CYSTOSCOPY  WITH  RETROGRADE PYELOGRAM, LEFT URETEROSCOPY , BIOPSY AND tumor ablation  STENT PLACEMENT;  Surgeon: Alexis Frock, MD;  Location: [facility];  Service: Urology;  Laterality: Left;   ESOPHAGOGASTRODUODENOSCOPY (EGD) WITH PROPOFOL N/A 09/22/2021   Procedure: ESOPHAGOGASTRODUODENOSCOPY (EGD) WITH PROPOFOL;  Surgeon: Mauri Pole, MD;  Location: [facility];  Service: Gastroenterology;  Laterality: N/A;   HOLMIUM LASER APPLICATION Left 07/25/2954   Procedure: HOLMIUM LASER APPLICATION;  Surgeon: Alexis Frock, MD;  Location: [facility];  Service: Urology;  Laterality: Left;   JOINT REPLACEMENT     Right hip, 2015   LAPAROSCOPY  06/03/2016   Duodenal ulcer repair   TONSILLECTOMY     TUBAL LIGATION     Social History   Tobacco Use   Smoking status: Every Day    Packs/day: 1.00    Years: 58.00    Total pack years: 58.00    Types: Cigarettes   Smokeless tobacco: Never   Tobacco comments:    trying to quit, wearing nicotine patch. Cautioned against smoking and wearing patch.1/4 ppd 03/18/21  Substance Use Topics   Alcohol use: Yes    Comment: h/o heavy use, not currently    Family History  Problem Relation Age of Onset   Heart disease Mother    Alcohol abuse Mother    Colon cancer Sister    Breast cancer Sister    Heart attack Brother    Cancer Neg Hx    Diabetes Neg Hx     Marital Status: Married  ROS  Review of Systems  Respiratory:  Positive for cough, shortness of breath and wheezing.    Objective      12/12/2021   12:00 PM 12/12/2021   11:30 AM 12/12/2021   11:00 AM  Vitals with BMI  Systolic 93 95 92  Diastolic 44 46 46  Pulse 71 68 64    Blood pressure (!) 93/44, pulse 71, temperature (!) 96.5 F (35.8 C), temperature source Temporal, resp. rate 18, height '5\' 1"'$  (1.549 m), weight 38 kg, SpO2 99 %.    Physical Exam Vitals reviewed.  HENT:     Head: Normocephalic and atraumatic.  Neck:     Vascular: Carotid bruit present.  Cardiovascular:     Rate and  Rhythm: Normal rate. Rhythm irregular.     Pulses: Normal pulses.     Heart sounds: Murmur heard.  Pulmonary:     Effort: No respiratory distress.     Breath sounds: Decreased breath sounds and wheezing present.  Abdominal:     General: Bowel sounds are normal.  Musculoskeletal:     Right lower leg: No edema.     Left lower leg: No edema.  Skin:    General: Skin is warm and dry.  Neurological:     Mental Status: She is alert.    Laboratory examination:   Recent Labs    09/22/21 0018 09/23/21 0456 12/12/21 0411 12/12/21 0758  NA 139 140 138 139  K 3.6 3.8 4.1 4.1  CL 108 110 108  --   CO2 20* 22 20*  --   GLUCOSE 145* 97 165*  --   BUN '17 15 15  '$ --   CREATININE 1.05* 1.25* 1.06*  --   CALCIUM 9.0 9.1 8.6*  --   GFRNONAA 53* 43* 53*  --    estimated creatinine clearance is 25 mL/min (A) (by C-G formula based on SCr of 1.06 mg/dL (H)).     Latest Ref Rng & Units 12/12/2021    7:58 AM 12/12/2021    4:11 AM 09/23/2021    4:56 AM  CMP  Glucose 70 - 99 mg/dL  165  97   BUN 8 - 23 mg/dL  15  15   Creatinine 0.44 - 1.00 mg/dL  1.06  1.25   Sodium 135 - 145 mmol/L 139  138  140   Potassium 3.5 - 5.1 mmol/L 4.1  4.1  3.8   Chloride 98 - 111 mmol/L  108  110   CO2 22 - 32 mmol/L  20  22   Calcium 8.9 - 10.3 mg/dL  8.6  9.1   Total Protein 6.5 - 8.1 g/dL  6.2    Total Bilirubin 0.3 - 1.2 mg/dL  0.4    Alkaline Phos 38 - 126 U/L  55    AST 15 - 41 U/L  51    ALT 0 - 44 U/L  30        Latest Ref Rng & Units 12/12/2021    7:58 AM 12/12/2021    4:11 AM 11/26/2021    9:03 AM  CBC  WBC 4.0 - 10.5 K/uL  9.0  5.6   Hemoglobin 12.0 - 15.0 g/dL 10.2  9.8  10.6   Hematocrit 36.0 - 46.0 % 30.0  30.5  32.2   Platelets 150 - 400 K/uL  176  192    Lipid Panel Recent Labs    07/13/21 1020 11/26/21 0903  CHOL 203* 287*  TRIG 151* 166*  LDLCALC  113* 201*  HDL 64 55    HEMOGLOBIN A1C No results found for: "HGBA1C", "MPG" TSH No results for input(s): "TSH" in the last  8760 hours. BNP (last 3 results) Recent Labs    02/18/21 1446 12/12/21 0412  BNP 1,747.8* 943.0*   Cardiac Panel (last 3 results) Recent Labs    12/12/21 0411  TROPONINIHS 73*     Medications and allergies   Allergies  Allergen Reactions   Bactrim [Sulfamethoxazole-Trimethoprim] Diarrhea and Nausea And Vomiting   Bee Venom Anaphylaxis     Current Meds  Medication Sig   acetaminophen (TYLENOL) 500 MG tablet Take 1,000 mg by mouth every 6 (six) hours as needed for mild pain.   albuterol (VENTOLIN HFA) 108 (90 Base) MCG/ACT inhaler Inhale 2 puffs into the lungs every 6 (six) hours as needed for wheezing or shortness of breath.   apraclonidine (IOPIDINE) 0.5 % ophthalmic solution Place 1 drop into the left eye 3 (three) times daily.   carboxymethylcellulose (REFRESH PLUS) 0.5 % SOLN Place 1 drop into both eyes 3 (three) times daily as needed (dry eyes).   diltiazem (CARDIZEM CD) 120 MG 24 hr capsule Take 1 capsule (120 mg total) by mouth daily.   dorzolamide (TRUSOPT) 2 % ophthalmic solution Place 1 drop into the left eye 3 (three) times daily.   DULoxetine (CYMBALTA) 60 MG capsule Take 60 mg by mouth daily.   ELIQUIS 2.5 MG TABS tablet TAKE 1 TABLET(2.5 MG) BY MOUTH TWICE DAILY (Patient taking differently: Take 2.5 mg by mouth 2 (two) times daily.)   EPINEPHrine 0.3 mg/0.3 mL IJ SOAJ injection Inject 0.3 mg into the muscle as needed for anaphylaxis.   ferrous sulfate 325 (65 FE) MG EC tablet Take 1 tablet (325 mg total) by mouth daily with breakfast.   Fluticasone-Umeclidin-Vilant (TRELEGY ELLIPTA) 100-62.5-25 MCG/ACT AEPB Inhale 1 puff into the lungs daily.   folic acid (FOLVITE) 1 MG tablet Take 1 mg by mouth daily.   hydrOXYzine (ATARAX) 10 MG tablet Take 10 mg by mouth daily as needed for anxiety.   latanoprost (XALATAN) 0.005 % ophthalmic solution Place 1 drop into the left eye at bedtime.   metoprolol succinate (TOPROL-XL) 25 MG 24 hr tablet Take 1 tablet (25 mg total) by  mouth daily.   nicotine (NICODERM CQ - DOSED IN MG/24 HOURS) 21 mg/24hr patch Place 1 patch (21 mg total) onto the skin daily. (Patient taking differently: Place 14 mg onto the skin daily.)   OXYGEN Inhale 1 L into the lungs daily as needed (shortness of breath).   pantoprazole (PROTONIX) 40 MG tablet Take 1 tablet (40 mg total) by mouth 2 (two) times daily.   prednisoLONE acetate (PRED FORTE) 1 % ophthalmic suspension Place 1 drop into the left eye 6 (six) times daily. X 10 days   pregabalin (LYRICA) 50 MG capsule Take 50 mg by mouth 3 (three) times daily as needed (pain).   rosuvastatin (CRESTOR) 40 MG tablet Take 40 mg by mouth daily.    Scheduled Meds:  apixaban  2.5 mg Oral BID   apraclonidine  1 drop Left Eye TID   dorzolamide  1 drop Left Eye TID   [START ON 12/13/2021] DULoxetine  60 mg Oral Daily   fluticasone furoate-vilanterol  1 puff Inhalation Daily   And   umeclidinium bromide  1 puff Inhalation Daily   furosemide  40 mg Intravenous BID   latanoprost  1 drop Left Eye QHS   methylPREDNISolone (SOLU-MEDROL) injection  80 mg  Intravenous Q24H   Followed by   Derrill Memo ON 12/15/2021] predniSONE  50 mg Oral Daily   molnupiravir EUA  4 capsule Oral BID   nicotine  14 mg Transdermal Daily   pantoprazole  40 mg Oral BID   prednisoLONE acetate  1 drop Left Eye 6 X Daily   pregabalin  75 mg Oral BID   [START ON 12/13/2021] rosuvastatin  40 mg Oral Daily   sodium chloride flush  3 mL Intravenous Q12H   Continuous Infusions:  azithromycin Stopped (12/12/21 0530)   cefTRIAXone (ROCEPHIN)  IV Stopped (12/12/21 0530)   diltiazem (CARDIZEM) infusion Stopped (12/12/21 1045)   PRN Meds:.acetaminophen **OR** acetaminophen, chlorpheniramine-HYDROcodone, guaiFENesin-dextromethorphan, hydrALAZINE, hydrOXYzine, ondansetron **OR** ondansetron (ZOFRAN) IV, polyvinyl alcohol, traZODone   I/O last 3 completed shifts: In: 350 [IV Piggyback:350] Out: 100 [Urine:100] No intake/output data  recorded.  Net IO Since Admission: 250 mL [12/12/21 1258]   Radiology:   Imaging results have been reviewed and DG Chest 2 View  Result Date: 12/12/2021 CLINICAL DATA:  Possible sepsis, shortness of breath. EXAM: CHEST - 2 VIEW COMPARISON:  09/21/2021. FINDINGS: Heart is enlarged and the mediastinal contour stable. Atherosclerotic calcification of the aorta is noted. The pulmonary vasculature is distended. Interstitial prominence is noted bilaterally and mild airspace disease is present at the lung bases. There are small bilateral pleural effusions. No pneumothorax. No acute osseous abnormality. IMPRESSION: 1. Cardiomegaly with pulmonary vascular congestion. 2. Interstitial prominence with mild airspace disease at the lung bases, possible edema or infiltrate. 3. Small bilateral pleural effusions. Electronically Signed   By: Brett Fairy M.D.   On: 12/12/2021 04:58    Cardiac Studies:   PCV MYOCARDIAL PERFUSION WITH LEXISCAN 06/22/2020 Lexiscan nuclear stress test performed using 1-day protocol. Normal myocardial perfusion. Stress LVEF calculated 44%, but visually appears normal. Low risk study.   Carotid artery duplex 10/13/2021: Moderate scattered plaque in the bilateral carotid arteries. Borderline elevated velocities in the left internal carotid artery, with left ICA/CCA  ratio 2.04, suggesting 50-69% stenosis.  Left carotid artery endarterectomy site is patent. Normal right velocities and ratio. Bilateral antegrade vertebral artery flow.   Echocardiogram 07/29/2021:   NORMAL LEFT VENTRICULAR SYSTOLIC FUNCTION WITH SEVERE LVH. Hyperdynamic left ventricular systolic function >64% with severe left    ventricular hypertrophy - there is systolic anterior motion of the mitral  valve leaflet - pattern consistent with hypertrophic obstruction CM.    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 Resting gradient of 50 mmHg.    - There is severe  mitral valve regurgitation present with SAM of the mitral valve.  Compared to office echocardiogram 07/06/2021, peak LVOT gradient was 89 mmHg and PA pressure was estimated at 41 mmHg.   EKG: 12/12/2021 Ectopic atrial rhythm ventricular rate 68 bpm.  Normal axis. LVH with repolarization abnormality  EKG 11/30/2021: Ectopic atrial rhythm ventricular rate 71 bpm.  Normal axis.  Anteroseptal infarct old.  LVH with repolarization abnormality, cannot exclude lateral ischemia.  Compared to 07/22/2021, ectopic atrial rhythm is new.   Assessment   Afib RVR currently CVR in patient with PAF  Acute covid infection  AHRF due to above   History of left-sided carotid endarterectomy 08/14/2021  Hypertension  Hyperlipidemia   Recommendations:   Labs and imaging reviewed Maintain on telemetry Covid isolation protocol in place Supplemental o2 as needed, BiPAP PRN Restart home cardiac meds as appropriate Optimize electrolytes, K>4 and Mag>2 Suspect Afib RVR due to acute covid infection  IV cardizem or lopressor for sustained tachycardia    Floydene Flock, DO, [facility] 12/12/2021, 12:58 PM Office: [phone]

## 2021-12-13 DIAGNOSIS — R0902 Hypoxemia: Secondary | ICD-10-CM | POA: Diagnosis not present

## 2021-12-13 DIAGNOSIS — U071 COVID-19: Secondary | ICD-10-CM | POA: Diagnosis not present

## 2021-12-13 DIAGNOSIS — I10 Essential (primary) hypertension: Secondary | ICD-10-CM

## 2021-12-13 DIAGNOSIS — J9601 Acute respiratory failure with hypoxia: Secondary | ICD-10-CM | POA: Diagnosis present

## 2021-12-13 LAB — URINE CULTURE: Culture: NO GROWTH

## 2021-12-13 LAB — BASIC METABOLIC PANEL
Anion gap: 9 (ref 5–15)
BUN: 20 mg/dL (ref 8–23)
CO2: 25 mmol/L (ref 22–32)
Calcium: 8.3 mg/dL — ABNORMAL LOW (ref 8.9–10.3)
Chloride: 105 mmol/L (ref 98–111)
Creatinine, Ser: 0.98 mg/dL (ref 0.44–1.00)
GFR, Estimated: 58 mL/min — ABNORMAL LOW (ref >60)
Glucose, Bld: 143 mg/dL — ABNORMAL HIGH (ref 70–99)
Potassium: 3.7 mmol/L (ref 3.5–5.1)
Sodium: 139 mmol/L (ref 135–145)

## 2021-12-13 LAB — CBC
HCT: 26.8 % — ABNORMAL LOW (ref 36.0–46.0)
Hemoglobin: 9 g/dL — ABNORMAL LOW (ref 12.0–15.0)
MCH: 31.4 pg (ref 26.0–34.0)
MCHC: 33.6 g/dL (ref 30.0–36.0)
MCV: 93.4 fL (ref 80.0–100.0)
Platelets: 159 10*3/uL (ref 150–400)
RBC: 2.87 MIL/uL — ABNORMAL LOW (ref 3.87–5.11)
RDW: 14 % (ref 11.5–15.5)
WBC: 6.9 10*3/uL (ref 4.0–10.5)
nRBC: 0 % (ref 0.0–0.2)

## 2021-12-13 LAB — C-REACTIVE PROTEIN: CRP: 2.1 mg/dL — ABNORMAL HIGH (ref <1.0)

## 2021-12-13 MED ORDER — METOPROLOL SUCCINATE ER 25 MG PO TB24
25.0000 mg | ORAL_TABLET | Freq: Every day | ORAL | Status: DC
  Administered 2021-12-13: 25 mg via ORAL
  Filled 2021-12-13: qty 1

## 2021-12-13 MED ORDER — DILTIAZEM HCL ER COATED BEADS 120 MG PO CP24
120.0000 mg | ORAL_CAPSULE | Freq: Every day | ORAL | Status: DC
  Administered 2021-12-13: 120 mg via ORAL
  Filled 2021-12-13: qty 1

## 2021-12-13 NOTE — Evaluation (Signed)
Physical Therapy Evaluation Patient Details Name: Kelly [name] MRN: [id number] DOB: 10/02/1940 Today's Date: 12/13/2021  History of Present Illness  Patient is a 81 y.o.  female with history of COPD on home O2, PAF, HTN, hypothyroidism-who presented with shortness of breath-was found to have COVID-19 infection triggering A-fib RVR with HFpEF exacerbation concerning worsening hypoxemia requiring BiPAP temporarily.  Clinical Impression  Patient presents with decreased mobility due to generalized weakness and decreased balance and activity tolerance.  Previously was mobilizing intermittently with walker or cane and able to assist in the home with IADL's.  Currently minguard A for mobility in the room with RW and fatigues though SpO2 WNL.  She will benefit from skilled PT in the acute setting and from follow up [place] at d/c.  Patient reports she had [facility] and aide recently.       Recommendations for follow up therapy are one component of a multi-disciplinary discharge planning process, led by the attending physician.  Recommendations may be updated based on patient status, additional functional criteria and insurance authorization.  Follow Up Recommendations Home health PT [facility] aide)      Assistance Recommended at Discharge Intermittent Supervision/Assistance  Patient can return home with the following  A little help with walking and/or transfers;Assist for transportation;Help with stairs or ramp for entrance;A little help with bathing/dressing/bathroom    Equipment Recommendations None recommended by PT  Recommendations for Other Services       Functional Status Assessment Patient has had a recent decline in their functional status and demonstrates the ability to make significant improvements in function in a reasonable and predictable amount of time.     Precautions / Restrictions Precautions Precautions: Fall      Mobility  Bed Mobility Overal bed mobility: Needs  Assistance Bed Mobility: Sit to Supine       Sit to supine: Min guard   General bed mobility comments: assist for lines back into stretcher    Transfers Overall transfer level: Needs assistance Equipment used: Rolling walker (2 wheels) Transfers: Sit to/from Stand Sit to Stand: Supervision           General transfer comment: up to stand from [facility] where NT had assisted her    Ambulation/Gait Ambulation/Gait assistance: Supervision, Min guard Gait Distance (Feet): 100 Feet (back and forth in room x 4) Assistive device: Rolling walker (2 wheels) Gait Pattern/deviations: Step-through pattern, Decreased stride length       General Gait Details: some issues with catching walker on chair leg as moving about the room, 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" and decreased depth perception awaiting cataract surgery on R eye, so minguard for safety at times around obstacles  Stairs            Wheelchair Mobility    Modified Rankin (Stroke Patients Only)       Balance Overall balance assessment: Needs assistance   Sitting balance-Leahy Scale: Normal Sitting balance - Comments: leaning over to floor to don shoes while on BSC     Standing balance-Leahy Scale: Fair Standing balance comment: can stand static without UE support moving to [facility] with NT no walker, using walker for ambulation in the room                             Pertinent Vitals/Pain Pain Assessment Pain Assessment: Faces Faces Pain Scale: Hurts a little bit Pain Location: general malaise per pt Pain Descriptors / Indicators: Discomfort  Pain Intervention(s): Monitored during session, Repositioned    Home Living Family/patient expects to be discharged to:: Private residence Living Arrangements: Other relatives Available Help at Discharge: Family;Available 24 hours/day Type of Home: House Home Access: Stairs to enter Entrance Stairs-Rails: None Entrance Stairs-Number of Steps:  5 Alternate Level Stairs-Number of Steps: stays on main Home Layout: Multi-level Home Equipment: Grab bars - tub/shower;Shower seat - built Medical sales representative (2 wheels) Additional Comments: enjoys doing a little cooking/chores with her daughter    Prior Function Prior Level of Function : Independent/Modified Independent             Mobility Comments: walking with RW and couple of falls without injury       Hand Dominance   Dominant Hand: Right    Extremity/Trunk Assessment   Upper Extremity Assessment Upper Extremity Assessment: Generalized weakness    Lower Extremity Assessment Lower Extremity Assessment: Generalized weakness    Cervical / Trunk Assessment Cervical / Trunk Assessment: Normal  Communication   Communication: No difficulties  Cognition Arousal/Alertness: Awake/alert Behavior During Therapy: WFL for tasks assessed/performed Overall Cognitive Status: Within Functional Limits for tasks assessed                                          General Comments      Exercises     Assessment/Plan    PT Assessment Patient needs continued PT services  PT Problem List Decreased strength;Decreased balance;Decreased mobility;Decreased activity tolerance;Cardiopulmonary status limiting activity       PT Treatment Interventions DME instruction;Functional mobility training;Balance training;Patient/family education;Therapeutic activities;Gait training;Stair training;Therapeutic exercise    PT Goals (Current goals can be found in the Care Plan section)  Acute Rehab PT Goals Patient Stated Goal: to return to independent PT Goal Formulation: With patient Time For Goal Achievement: 12/27/21 Potential to Achieve Goals: Good    Frequency Min 3X/week     Co-evaluation               AM-PAC PT "6 Clicks" Mobility  Outcome Measure Help needed turning from your back to your side while in a flat bed without using bedrails?: None Help needed  moving from lying on your back to sitting on the side of a flat bed without using bedrails?: None Help needed moving to and from a bed to a chair (including a wheelchair)?: A Little Help needed standing up from a chair using your arms (e.g., wheelchair or bedside chair)?: A Little Help needed to walk in hospital room?: A Little Help needed climbing 3-5 steps with a railing? : Total 6 Click Score: 18    End of Session Equipment Utilized During Treatment: Gait belt Activity Tolerance: Patient tolerated treatment well Patient left: in bed;with call bell/phone within reach   PT Visit Diagnosis: Other abnormalities of gait and mobility (R26.89);Muscle weakness (generalized) (M62.81)    Time: 7673-4193 PT Time Calculation (min) (ACUTE ONLY): 28 min   Charges:   PT Evaluation $PT Eval Low Complexity: 1 Low PT Treatments $Gait Training: 8-22 mins        Magda Kiel, PT Acute Rehabilitation Services Office:[phone] 12/13/2021   Reginia Naas 12/13/2021, 12:52 PM

## 2021-12-13 NOTE — ED Notes (Signed)
Patient called to nurses station and expressed intent to leave. Patient encouraged to stay until morning as patient had previously stated that she was told she would be discharged then. Patient insisted on leaving. Daughter present, patient left with daughter. Patient alert, oriented, ambulating around room on room air independently with steady gait and in no apparent distress.

## 2021-12-13 NOTE — Evaluation (Addendum)
Occupational Therapy Evaluation/Discharge Patient Details Name: Kelly [name] MRN: [id number] DOB: [birth year] Today's Date: 12/13/2021   History of Present Illness Patient is a 81 y.o.  female with history of COPD on home O2, PAF, HTN, hypothyroidism-who presented with shortness of breath-was found to have COVID-19 infection triggering A-fib RVR with HFpEF exacerbation concerning worsening hypoxemia requiring BiPAP temporarily.   Clinical Impression   PTA, pt lives with family and typically Independent in ADLs, household IADLs and mobility. Pt presents now at baseline for ADLs with intermittent assist only needed for line mgmt during in-room mobility, peri care and LB dressing. 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 Pt denies SOB during activity, reports only wearing 2 L O2 at home prn. No further skilled OT services needed at acute level or at DC.      Recommendations for follow up therapy are one component of a multi-disciplinary discharge planning process, led by the attending physician.  Recommendations may be updated based on patient status, additional functional criteria and insurance authorization.   Follow Up Recommendations  No OT follow up     Assistance Recommended at Discharge PRN  Patient can return home with the following Assist for transportation;Assistance with cooking/housework    Functional Status Assessment  Patient has not had a recent decline in their functional status  Equipment Recommendations  None recommended by OT    Recommendations for Other Services       Precautions / Restrictions Precautions Precautions: Fall;Other (comment) Precaution Comments: wears 2 L O2 prn Restrictions Weight Bearing Restrictions: No      Mobility Bed Mobility Overal bed mobility: Modified Independent Bed Mobility: Supine to Sit, Sit to Supine     Supine to  sit: Modified independent (Device/Increase time) Sit to supine: Modified independent (Device/Increase time)        Transfers Overall transfer level: Independent Equipment used: None Transfers: Sit to/from Stand Sit to Stand: Independent                  Balance Overall balance assessment: Needs assistance Sitting-balance support: No upper extremity supported, Feet supported Sitting balance-Leahy Scale: Normal       Standing balance-Leahy Scale: Good Standing balance comment: can static stand and ambulate a short distance without AD                           ADL either performed or assessed with clinical judgement   ADL Overall ADL's : At baseline                                       General ADL Comments: able to mobilize in room without AD, bathe LB at sink and brush teeth. assist for lines/wrap around brief but pt assisting as well. 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 also reports cataract in R eye with plans to remove once L eye deemed stable     Perception  Praxis      Pertinent Vitals/Pain Pain Assessment Pain Assessment: No/denies pain     Hand Dominance Right   Extremity/Trunk Assessment Upper Extremity Assessment Upper Extremity Assessment: Overall WFL for tasks assessed   Lower Extremity Assessment Lower Extremity Assessment: Defer to PT evaluation   Cervical / Trunk Assessment Cervical / Trunk Assessment: Normal   Communication Communication Communication: No difficulties   Cognition Arousal/Alertness:  Awake/alert Behavior During Therapy: WFL for tasks assessed/performed Overall Cognitive Status: Within Functional Limits for tasks assessed                                       General Comments  daughter present initially    Exercises     Shoulder Instructions      Home Living Family/patient expects to be discharged to:: Private residence Living Arrangements: Other relatives;Children Available Help at Discharge: Family;Available 24 hours/day Type of Home: House Home Access: Stairs to enter [employer] of Steps: 5 Entrance Stairs-Rails: None Home Layout: Multi-level Alternate Level Stairs-Number of Steps: stays on main   Bathroom Shower/Tub: Occupational psychologist: Standard Bathroom Accessibility: Yes   Home Equipment: Grab bars - tub/shower;Shower seat - built Medical sales representative (2 wheels)   Additional Comments: enjoys doing a little cooking/chores with her daughter      Prior Functioning/Environment Prior Level of Function : Independent/Modified Independent             Mobility Comments: reports no use of AD typically ADLs Comments: Independent with ADLs, does share IADLs though cooking has become increasingly difficult since L eye hemorrhage        OT Problem List: Impaired vision/perception      OT Treatment/Interventions:      OT Goals(Current goals can be found in the care plan section) Acute Rehab OT Goals Patient Stated Goal: home tomorrow OT Goal Formulation: All assessment and education complete, DC therapy  OT Frequency:      Co-evaluation              AM-PAC OT "6 Clicks" Daily Activity     Outcome Measure Help from another person eating meals?: None Help from another person taking care of personal grooming?: None Help from another person toileting, which includes using toliet, bedpan, or urinal?: None Help from another person bathing (including washing, rinsing, drying)?: None Help from another  person to put on and taking off regular upper body clothing?: None Help from another person to put on and taking off regular lower body clothing?: None 6 Click Score: 24   End of Session    Activity Tolerance: Patient tolerated treatment well Patient left: in bed;with call bell/phone within reach  OT Visit Diagnosis: Muscle weakness (generalized) (M62.81)                Time: 2423-5361 OT Time Calculation (min): 34 min Charges:  OT General Charges $OT Visit: 1 Visit OT Evaluation $OT Eval Low Complexity: 1 Low OT Treatments $Self Care/Home Management : 8-22 mins  Malachy Chamber, OTR/L Acute Rehab Services Office: [phone]   Layla Maw 12/13/2021, 2:27 PM

## 2021-12-13 NOTE — Progress Notes (Signed)
PROGRESS NOTE        PATIENT DETAILS Name: Kelly [name] Age: 81 y.o. Sex: female Date of Birth: [birth year] Admit Date: 12/12/2021 Admitting Physician Karmen Bongo, MD JJO:[id number], Corliss Skains, FNP  Brief Summary: Patient is a 81 y.o.  female with history of COPD on home O2, PAF, HTN, hypothyroidism-who presented with shortness of breath-was found to have COVID-19 infection triggering A-fib RVR with HFpEF exacerbation concerning worsening hypoxemia requiring BiPAP on admission.  Significant events: 11/26>> admit to [facility]-COVID-19 infection-A-fib RVR-HFpEF exacerbation-required BiPAP on admission.  Significant studies: 11/26>> CXR: Likely CHF.  Significant microbiology data: 11/26>> blood culture: No growth 11/26>> COVID PCR: Positive 11/26>> influenza A/B: Negative  Procedures: None  Consults: Cardiology  Subjective: Feels much better-stable on just 3 L of oxygen.  Liberated off BiPAP yesterday evening/overnight.  Objective: Vitals: Blood pressure (!) 133/57, pulse 65, temperature 98.2 F (36.8 C), temperature source Temporal, resp. rate (!) 27, height '5\' 1"'$  (1.549 m), weight 38 kg, SpO2 100 %.   Exam: Gen Exam:Alert awake-not in any distress HEENT:atraumatic, normocephalic Chest: Some scattered wheezing. CVS:S1S2 regular Abdomen:soft non tender, non distended Extremities:no edema Neurology: Non focal Skin: no rash  Pertinent Labs/Radiology:    Latest Ref Rng & Units 12/13/2021    4:45 AM 12/12/2021    7:58 AM 12/12/2021    4:11 AM  CBC  WBC 4.0 - 10.5 K/uL 6.9   9.0   Hemoglobin 12.0 - 15.0 g/dL 9.0  10.2  9.8   Hematocrit 36.0 - 46.0 % 26.8  30.0  30.5   Platelets 150 - 400 K/uL 159   176     Lab Results  Component Value Date   NA 139 12/13/2021   K 3.7 12/13/2021   CL 105 12/13/2021   CO2 25 12/13/2021      Assessment/Plan: Sepsis COVID-19 infection Sepsis physiology resolved Check procalcitonin-if negative-can  discontinue Rocephin/Zithromax Continue molnupiravir CRP only minimally elevated-CXR more consistent with CHF than COVID pneumonitis.  Hence begin rapid steroids taper  Acute on chronic hypoxic respiratory failure secondary to HFpEF exacerbation in the setting of A-fib RVR/COVID-19 infection Hypoxia much better-required BiPAP on admission Stable on 2-3 L of oxygen (home regimen 2 L at baseline) On IV Lasix  HFpEF exacerbation Triggered by A-fib RVR/COVID-19 infection Respiratory status much better Remains on IV Lasix-maintain negative balance Follow weights/electrolytes  PAF RVR Briefly required Cardizem infusion-spontaneously converted back to sinus rhythm Resume usual doses of Cardizem/beta-blocker Continue Eliquis Appreciate cardiology input  Chronic normocytic anemia Chronic issue Stable for outpatient follow-up  COPD Chronic hypoxic respiratory failure on 2 L of oxygen at home Not in exacerbation Continue bronchodilators  HTN BP stable this morning Continue Cardizem/beta-blocker  HLD Continue statin Resume Repatha on discharge  Hypothyroidism Continue Synthroid  Glaucoma Continue dorzolamide/prednisolone eyedrops  Tobacco dependence Counseled Transdermal nicotine  Underweight: Estimated body mass index is 15.83 kg/m as calculated from the following:   Height as of this encounter: '5\' 1"'$  (1.549 m).   Weight as of this encounter: 38 kg.   Code status:   Code Status: Full Code   DVT Prophylaxis: apixaban (ELIQUIS) tablet 2.5 mg Start: 12/12/21 1000 apixaban (ELIQUIS) tablet 2.5 mg    Family Communication:  Son in law-Joseph [phone] updated over the phone at patient's request.  Disposition Plan: Status is: Inpatient Remains inpatient appropriate because: Resolving hypoxia/sepsis   Planned Discharge  Destination:Home [place] 11/28 if clinical improvement continues   Diet: Diet Order             Diet Heart Room service appropriate?  Yes; Fluid consistency: Thin  Diet effective ____                     Antimicrobial agents: Anti-infectives (From admission, onward)    Start     Dose/Rate Route Frequency Ordered Stop   12/12/21 1000  nirmatrelvir/ritonavir EUA (PAXLOVID) 3 tablet  Status:  Discontinued        3 tablet Oral 2 times daily 12/12/21 0553 12/12/21 0602   12/12/21 0645  molnupiravir EUA (LAGEVRIO) capsule 800 mg        4 capsule Oral 2 times daily 12/12/21 7893 12/17/21 0959   12/12/21 0415  cefTRIAXone (ROCEPHIN) 2 g in sodium chloride 0.9 % 100 mL IVPB        2 g 200 mL/hr over 30 Minutes Intravenous Every 24 hours 12/12/21 0411 12/17/21 0414   12/12/21 0415  azithromycin (ZITHROMAX) 500 mg in sodium chloride 0.9 % 250 mL IVPB        500 mg 250 mL/hr over 60 Minutes Intravenous Every 24 hours 12/12/21 0411 12/17/21 0414        MEDICATIONS: Scheduled Meds:  apixaban  2.5 mg Oral BID   apraclonidine  1 drop Left Eye TID   diltiazem  120 mg Oral Daily   dorzolamide  1 drop Left Eye TID   DULoxetine  60 mg Oral Daily   fluticasone furoate-vilanterol  1 puff Inhalation Daily   And   umeclidinium bromide  1 puff Inhalation Daily   furosemide  40 mg Intravenous BID   latanoprost  1 drop Left Eye QHS   methylPREDNISolone (SOLU-MEDROL) injection  80 mg Intravenous Q24H   Followed by   Derrill Memo ON 12/15/2021] predniSONE  50 mg Oral Daily   metoprolol succinate  25 mg Oral Daily   molnupiravir EUA  4 capsule Oral BID   nicotine  14 mg Transdermal Daily   pantoprazole  40 mg Oral BID   prednisoLONE acetate  1 drop Left Eye 6 X Daily   pregabalin  75 mg Oral BID   rosuvastatin  40 mg Oral Daily   sodium chloride flush  3 mL Intravenous Q12H   Continuous Infusions:  azithromycin Stopped (12/13/21 0425)   cefTRIAXone (ROCEPHIN)  IV Stopped (12/13/21 0359)   PRN Meds:.acetaminophen **OR** acetaminophen, chlorpheniramine-HYDROcodone, guaiFENesin-dextromethorphan, hydrALAZINE, hydrOXYzine,  ondansetron **OR** ondansetron (ZOFRAN) IV, polyvinyl alcohol, traZODone   I have personally reviewed following labs and imaging studies  LABORATORY DATA: CBC: Recent Labs  Lab 12/12/21 0411 12/12/21 0758 12/13/21 0445  WBC 9.0  --  6.9  NEUTROABS 7.6  --   --   HGB 9.8* 10.2* 9.0*  HCT 30.5* 30.0* 26.8*  MCV 98.1  --  93.4  PLT 176  --  810    Basic Metabolic Panel: Recent Labs  Lab 12/12/21 0411 12/12/21 0758 12/13/21 0445  NA 138 139 139  K 4.1 4.1 3.7  CL 108  --  105  CO2 20*  --  25  GLUCOSE 165*  --  143*  BUN 15  --  20  CREATININE 1.06*  --  0.98  CALCIUM 8.6*  --  8.3*    GFR: Estimated Creatinine Clearance: 27 mL/min (by C-G formula based on SCr of 0.98 mg/dL).  Liver Function Tests: Recent Labs  Lab 12/12/21 0411  AST  51*  ALT 30  ALKPHOS 55  BILITOT 0.4  PROT 6.2*  ALBUMIN 3.6   No results for input(s): "LIPASE", "AMYLASE" in the last 168 hours. No results for input(s): "AMMONIA" in the last 168 hours.  Coagulation Profile: Recent Labs  Lab 12/12/21 0411  INR 1.1    Cardiac Enzymes: No results for input(s): "CKTOTAL", "CKMB", "CKMBINDEX", "TROPONINI" in the last 168 hours.  BNP (last 3 results) No results for input(s): "PROBNP" in the last 8760 hours.  Lipid Profile: No results for input(s): "CHOL", "HDL", "LDLCALC", "TRIG", "CHOLHDL", "LDLDIRECT" in the last 72 hours.  Thyroid Function Tests: No results for input(s): "TSH", "T4TOTAL", "FREET4", "T3FREE", "THYROIDAB" in the last 72 hours.  Anemia Panel: No results for input(s): "VITAMINB12", "FOLATE", "FERRITIN", "TIBC", "IRON", "RETICCTPCT" in the last 72 hours.  Urine analysis:    Component Value Date/Time   COLORURINE YELLOW 12/12/2021 1220   APPEARANCEUR CLEAR 12/12/2021 1220   LABSPEC 1.009 12/12/2021 1220   PHURINE 5.0 12/12/2021 1220   GLUCOSEU NEGATIVE 12/12/2021 1220   HGBUR NEGATIVE 12/12/2021 1220   BILIRUBINUR NEGATIVE 12/12/2021 1220   KETONESUR NEGATIVE  12/12/2021 1220   PROTEINUR NEGATIVE 12/12/2021 1220   NITRITE NEGATIVE 12/12/2021 1220   LEUKOCYTESUR NEGATIVE 12/12/2021 1220    Sepsis Labs: Lactic Acid, Venous    Component Value Date/Time   LATICACIDVEN 1.6 12/12/2021 0411    MICROBIOLOGY: Recent Results (from the past 240 hour(s))  Blood Culture (routine x 2)     Status: None (Preliminary result)   Collection Time: 12/12/21  3:53 AM   Specimen: BLOOD  Result Value Ref Range Status   Specimen Description BLOOD SITE NOT SPECIFIED  Final   Special Requests   Final    BOTTLES DRAWN AEROBIC AND ANAEROBIC Blood Culture adequate volume   Culture   Final    NO GROWTH 1 DAY Performed at [facility], [place] [address] [place], [place] [address]    Report Status PENDING  Incomplete  Resp Panel by RT-PCR (Flu A&B, Covid) Anterior Nasal Swab     Status: Abnormal   Collection Time: 12/12/21  4:45 AM   Specimen: Anterior Nasal Swab  Result Value Ref Range Status   SARS Coronavirus 2 by RT PCR POSITIVE (A) NEGATIVE Final    Comment: (NOTE) SARS-CoV-2 target nucleic acids are DETECTED.  The SARS-CoV-2 RNA is generally detectable in upper respiratory specimens during the acute phase of infection. Positive results are indicative of the presence of the identified virus, but do not rule out bacterial infection or co-infection with other pathogens not detected by the test. Clinical correlation with patient history and other diagnostic information is necessary to determine patient infection status. The expected result is Negative.  Fact Sheet for Patients: EntrepreneurPulse.com.au  Fact Sheet for Healthcare Providers: IncredibleEmployment.be  This test is not yet approved or cleared by the [place] FDA and  has been authorized for detection and/or diagnosis of SARS-CoV-2 by FDA under an Emergency Use Authorization (EUA).  This EUA will remain in effect (meaning this test can be used) for  the duration of  the COVID-19 declaration under Section 564(b)(1) of the A ct, 21 U.S.C. section 360bbb-3(b)(1), unless the authorization is terminated or revoked sooner.     Influenza A by PCR NEGATIVE NEGATIVE Final   Influenza B by PCR NEGATIVE NEGATIVE Final    Comment: (NOTE) The Xpert Xpress SARS-CoV-2/FLU/RSV plus assay is intended as an aid in the diagnosis of influenza from Nasopharyngeal swab specimens and should  not be used as a sole basis for treatment. Nasal washings and aspirates are unacceptable for Xpert Xpress SARS-CoV-2/FLU/RSV testing.  Fact Sheet for Patients: EntrepreneurPulse.com.au  Fact Sheet for Healthcare Providers: IncredibleEmployment.be  This test is not yet approved or cleared by the [place] FDA and has been authorized for detection and/or diagnosis of SARS-CoV-2 by FDA under an Emergency Use Authorization (EUA). This EUA will remain in effect (meaning this test can be used) for the duration of the COVID-19 declaration under Section 564(b)(1) of the Act, 21 U.S.C. section 360bbb-3(b)(1), unless the authorization is terminated or revoked.  Performed at [facility], [place] [address] [place], [place] [address]   Blood Culture (routine x 2)     Status: None (Preliminary result)   Collection Time: 12/12/21  4:55 AM   Specimen: BLOOD LEFT FOREARM  Result Value Ref Range Status   Specimen Description BLOOD LEFT FOREARM  Final   Special Requests   Final    BOTTLES DRAWN AEROBIC AND ANAEROBIC Blood Culture adequate volume   Culture   Final    NO GROWTH 1 DAY Performed at [facility], [place] [address], [place], [place] [address]    Report Status PENDING  Incomplete    RADIOLOGY STUDIES/RESULTS: DG Chest 2 View  Result Date: 12/12/2021 CLINICAL DATA:  Possible sepsis, shortness of breath. EXAM: CHEST - 2 VIEW COMPARISON:  09/21/2021. FINDINGS: Heart is enlarged and the mediastinal contour stable.  Atherosclerotic calcification of the aorta is noted. The pulmonary vasculature is distended. Interstitial prominence is noted bilaterally and mild airspace disease is present at the lung bases. There are small bilateral pleural effusions. No pneumothorax. No acute osseous abnormality. IMPRESSION: 1. Cardiomegaly with pulmonary vascular congestion. 2. Interstitial prominence with mild airspace disease at the lung bases, possible edema or infiltrate. 3. Small bilateral pleural effusions. Electronically Signed   By: Brett Fairy M.D.   On: 12/12/2021 04:58     LOS: 1 day   Oren Binet, MD  [facility]    To contact the attending provider between 7A-7P or the covering provider during after hours 7P-7A, please log into the web site [email or web address] and access using universal [place] password for that web site. If you do not have the password, please call the hospital operator.  12/13/2021, 9:27 AM

## 2021-12-13 NOTE — Progress Notes (Signed)
Heart Failure Navigator Progress Note  Assessed for Heart & Vascular TOC clinic readiness.  Patient does not meet criteria due to [facility] patient.   Navigator will sign off at this time.    Amarisa Wilinski, BSN, RN Heart Failure Nurse Navigator Secure Chat Only   

## 2021-12-16 NOTE — Discharge Summary (Signed)
PATIENT DETAILS Name: Kelly [name] Age: 81 y.o. Sex: female Date of Birth: 1940/02/18 MRN: [id number]. Admitting Physician: Jonetta Osgood, MD [id number], Corliss Skains, FNP  Admit Date: 12/12/2021 Discharge date: 12/13/2021  Patient left AGAINST MEDICAL ADVICE  Recommendations for Outpatient Follow-up:  Follow up with PCP in 1-2 weeks Please obtain CMP/CBC in one week  Admitted From:  Home  Disposition: Home   Discharge Condition: fair  CODE STATUS:   Code Status: Prior   Diet recommendation:  Diet Order     None        Brief Summary: Patient is a 81 y.o.  female with history of COPD on home O2, PAF, HTN, hypothyroidism-who presented with shortness of breath-was found to have COVID-19 infection triggering A-fib RVR with HFpEF exacerbation concerning worsening hypoxemia requiring BiPAP on admission.   Significant events: 11/26>> admit to [facility]-COVID-19 infection-A-fib RVR-HFpEF exacerbation-required BiPAP on admission. 11/27>> left AMA   Significant studies: 11/26>> CXR: Likely CHF.   Significant microbiology data: 11/26>> blood culture: No growth 11/26>> COVID PCR: Positive 11/26>> influenza A/B: Negative   Procedures: None   Consults: Cardiology    Brief Hospital Course: Patient is a 81 y.o.  female with history of COPD on home O2, PAF, HTN, hypothyroidism-who presented with shortness of breath-was found to have COVID-19 infection triggering A-fib RVR with HFpEF exacerbation concerning worsening hypoxemia requiring BiPAP on admission.  Patient was managed with supportive care including diuretics-with rapid clinical improvement.  Plan was to monitor for 1 additional day, however patient left AGAINST MEDICAL ADVICE on 11/27.  See below for further details.   Significant events: 11/26>> admit to [facility]-COVID-19 infection-A-fib RVR-HFpEF exacerbation-required BiPAP on admission.    Significant studies: 11/26>> CXR: Likely CHF.   Significant  microbiology data: 11/26>> blood culture: No growth 11/26>> COVID PCR: Positive 11/26>> influenza A/B: Negative   Procedures: None   Consults: Cardiology    Assessment/Plan: Sepsis COVID-19 infection Sepsis physiology rapidly improved Empirically was on Rocephin/Zithromax-with plans to discontinue once procalcitonin was  negative. Was also maintained on molnupiravir Unfortunately-patient signed out AMA on 11/27 before above plans could materialize.     Acute on chronic hypoxic respiratory failure secondary to HFpEF exacerbation in the setting of A-fib RVR/COVID-19 infection Hypoxia much better-required BiPAP on admission Stable on 2-3 L of oxygen (home regimen 2 L at baseline) Was on IV Lasix   HFpEF exacerbation Triggered by A-fib RVR/COVID-19 infection Respiratory status much better Remains on IV Lasix-maintain negative balance Plan is to follow volume status closely and transition to oral furosemide Unfortunately-signed out AMA on 11/27.   PAF RVR Briefly required Cardizem infusion-spontaneously converted back to sinus rhythm Was maintained on Cardizem/beta-blocker/Eliquis.   Cardiology was following.     Chronic normocytic anemia Chronic issue Stable for outpatient follow-up   COPD Chronic hypoxic respiratory failure on 2 L of oxygen at home Not in exacerbation Continue bronchodilators   HTN BP stable  Continue Cardizem/beta-blocker   HLD Continue statin Resume Repatha on discharge   Hypothyroidism Continue Synthroid   Glaucoma Continue dorzolamide/prednisolone eyedrops   Tobacco dependence Counseled Transdermal nicotine   Underweight: Estimated body mass index is 15.83 kg/m as calculated from the following:   Height as of this encounter: '5\' 1"'$  (1.549 m).   Weight as of this encounter: 38 kg.     Discharge Diagnoses:  Principal Problem:   COVID-19 virus infection Active Problems:   Tobacco use disorder   Chronic obstructive pulmonary  disease (HCC)   Hypothyroidism, unspecified  Paroxysmal atrial fibrillation (HCC)   Mixed hyperlipidemia   Primary hypertension   Acute hypoxemic respiratory failure [facility])   Discharge Instructions:  Activity:  As tolerated    Allergies as of 12/13/2021       Reactions   Bactrim [sulfamethoxazole-trimethoprim] Diarrhea, Nausea And Vomiting   Bee Venom Anaphylaxis        Medication List-note this was the medication list prior to admission, patient left the hospital [place] before medication reconciliation could be even done.     ASK your doctor about these medications    acetaminophen 500 MG tablet Commonly known as: TYLENOL Take 1,000 mg by mouth every 6 (six) hours as needed for mild pain.   albuterol 108 (90 Base) MCG/ACT inhaler Commonly known as: VENTOLIN HFA Inhale 2 puffs into the lungs every 6 (six) hours as needed for wheezing or shortness of breath.   apraclonidine 0.5 % ophthalmic solution Commonly known as: IOPIDINE Place 1 drop into the left eye 3 (three) times daily.   carboxymethylcellulose 0.5 % Soln Commonly known as: REFRESH PLUS Place 1 drop into both eyes 3 (three) times daily as needed (dry eyes).   diltiazem 120 MG 24 hr capsule Commonly known as: CARDIZEM CD Take 1 capsule (120 mg total) by mouth daily.   dorzolamide 2 % ophthalmic solution Commonly known as: TRUSOPT Place 1 drop into the left eye 3 (three) times daily.   DULoxetine 60 MG capsule Commonly known as: CYMBALTA Take 60 mg by mouth daily.   Eliquis 2.5 MG Tabs tablet Generic drug: apixaban TAKE 1 TABLET(2.5 MG) BY MOUTH TWICE DAILY   EPINEPHrine 0.3 mg/0.3 mL Soaj injection Commonly known as: EPI-PEN Inject 0.3 mg into the muscle as needed for anaphylaxis.   ferrous sulfate 325 (65 FE) MG EC tablet Take 1 tablet (325 mg total) by mouth daily with breakfast.   folic acid 1 MG tablet Commonly known as: FOLVITE Take 1 mg by mouth daily.   hydrOXYzine  10 MG tablet Commonly known as: ATARAX Take 10 mg by mouth daily as needed for anxiety.   latanoprost 0.005 % ophthalmic solution Commonly known as: XALATAN Place 1 drop into the left eye at bedtime.   metoprolol succinate 25 MG 24 hr tablet Commonly known as: TOPROL-XL Take 1 tablet (25 mg total) by mouth daily.   nicotine 21 mg/24hr patch Commonly known as: NICODERM CQ - dosed in mg/24 hours Place 1 patch (21 mg total) onto the skin daily.   OXYGEN Inhale 1 L into the lungs daily as needed (shortness of breath).   pantoprazole 40 MG tablet Commonly known as: PROTONIX Take 1 tablet (40 mg total) by mouth 2 (two) times daily.   prednisoLONE acetate 1 % ophthalmic suspension Commonly known as: PRED FORTE Place 1 drop into the left eye 6 (six) times daily. X 10 days   pregabalin 50 MG capsule Commonly known as: LYRICA Take 50 mg by mouth 3 (three) times daily as needed (pain).   Repatha 140 MG/ML Sosy Generic drug: Evolocumab INJECT 1 ML UNDER THE SKIN EVERY 14 DAYS AS DIRECTED   rosuvastatin 40 MG tablet Commonly known as: CRESTOR Take 40 mg by mouth daily.   traZODone 100 MG tablet Commonly known as: DESYREL Take 100 mg by mouth at bedtime as needed for sleep.   Trelegy Ellipta 100-62.5-25 MCG/ACT Aepb Generic drug: Fluticasone-Umeclidin-Vilant Inhale 1 puff into the lungs daily.   Vortex Valved [employer] by Does not apply route.  Allergies  Allergen Reactions   Bactrim [Sulfamethoxazole-Trimethoprim] Diarrhea and Nausea And Vomiting   Bee Venom Anaphylaxis     Other Procedures/Studies: DG Chest 2 View  Result Date: 12/12/2021 CLINICAL DATA:  Possible sepsis, shortness of breath. EXAM: CHEST - 2 VIEW COMPARISON:  09/21/2021. FINDINGS: Heart is enlarged and the mediastinal contour stable. Atherosclerotic calcification of the aorta is noted. The pulmonary vasculature is distended. Interstitial prominence is noted bilaterally and mild  airspace disease is present at the lung bases. There are small bilateral pleural effusions. No pneumothorax. No acute osseous abnormality. IMPRESSION: 1. Cardiomegaly with pulmonary vascular congestion. 2. Interstitial prominence with mild airspace disease at the lung bases, possible edema or infiltrate. 3. Small bilateral pleural effusions. Electronically Signed   By: Brett Fairy M.D.   On: 12/12/2021 04:58     TODAY-DAY OF DISCHARGE:  Subjective:   Kelly [name] signed out AMA  Objective:   Blood pressure (!) 112/52, [name] 79, temperature 98.2 F (36.8 C), temperature source Temporal, resp. rate 19, height '5\' 1"'$  (1.549 m), weight 38 kg, SpO2 100 %. No intake or output data in the 24 hours ending 12/16/21 1429 Filed Weights   12/12/21 0347 12/12/21 1023  Weight: 38.1 kg 38 kg    Exam: Awake Alert, Oriented *3   PERTINENT RADIOLOGIC STUDIES: No results found.   PERTINENT LAB RESULTS: CBC: No results for input(s): "WBC", "HGB", "HCT", "PLT" in the last 72 hours. CMET CMP     Component Value Date/Time   NA 139 12/13/2021 0445   K 3.7 12/13/2021 0445   CL 105 12/13/2021 0445   CO2 25 12/13/2021 0445   GLUCOSE 143 (H) 12/13/2021 0445   BUN 20 12/13/2021 0445   CREATININE 0.98 12/13/2021 0445   CALCIUM 8.3 (L) 12/13/2021 0445   PROT 6.2 (L) 12/12/2021 0411   ALBUMIN 3.6 12/12/2021 0411   AST 51 (H) 12/12/2021 0411   ALT 30 12/12/2021 0411   ALKPHOS 55 12/12/2021 0411   BILITOT 0.4 12/12/2021 0411   GFRNONAA 58 (L) 12/13/2021 0445    GFR Estimated Creatinine Clearance: 27 mL/min (by C-G formula based on SCr of 0.98 mg/dL). No results for input(s): "LIPASE", "AMYLASE" in the last 72 hours. No results for input(s): "CKTOTAL", "CKMB", "CKMBINDEX", "TROPONINI" in the last 72 hours. Invalid input(s): "POCBNP" No results for input(s): "DDIMER" in the last 72 hours. No results for input(s): "HGBA1C" in the last 72 hours. No results for input(s): "CHOL", "HDL",  "LDLCALC", "TRIG", "CHOLHDL", "LDLDIRECT" in the last 72 hours. No results for input(s): "TSH", "T4TOTAL", "T3FREE", "THYROIDAB" in the last 72 hours.  Invalid input(s): "FREET3" No results for input(s): "VITAMINB12", "FOLATE", "FERRITIN", "TIBC", "IRON", "RETICCTPCT" in the last 72 hours. Coags: No results for input(s): "INR" in the last 72 hours.  Invalid input(s): "PT" Microbiology: Recent Results (from the past 240 hour(s))  Blood Culture (routine x 2)     Status: None (Preliminary result)   Collection Time: 12/12/21  3:53 AM   Specimen: BLOOD  Result Value Ref Range Status   Specimen Description BLOOD SITE NOT SPECIFIED  Final   Special Requests   Final    BOTTLES DRAWN AEROBIC AND ANAEROBIC Blood Culture adequate volume   Culture   Final    NO GROWTH 4 DAYS Performed at [facility], [address], [place], [place] [address]    Report Status PENDING  Incomplete  Resp Panel by RT-PCR (Flu A&B, Covid) Anterior Nasal Swab     Status: Abnormal  Collection Time: 12/12/21  4:45 AM   Specimen: Anterior Nasal Swab  Result Value Ref Range Status   SARS Coronavirus 2 by RT PCR POSITIVE (A) NEGATIVE Final    Comment: (NOTE) SARS-CoV-2 target nucleic acids are DETECTED.  The SARS-CoV-2 RNA is generally detectable in upper respiratory specimens during the acute phase of infection. Positive results are indicative of the presence of the identified virus, but do not rule out bacterial infection or co-infection with other pathogens not detected by the test. Clinical correlation with patient history and other diagnostic information is necessary to determine patient infection status. The expected result is Negative.  Fact Sheet for Patients: EntrepreneurPulse.com.au  Fact Sheet for Healthcare Providers: IncredibleEmployment.be  This test is not yet approved or cleared by the [place] FDA and  has been authorized for detection and/or  diagnosis of SARS-CoV-2 by FDA under an Emergency Use Authorization (EUA).  This EUA will remain in effect (meaning this test can be used) for the duration of  the COVID-19 declaration under Section 564(b)(1) of the A ct, 21 U.S.C. section 360bbb-3(b)(1), unless the authorization is terminated or revoked sooner.     Influenza A by PCR NEGATIVE NEGATIVE Final   Influenza B by PCR NEGATIVE NEGATIVE Final    Comment: (NOTE) The Xpert Xpress SARS-CoV-2/FLU/RSV plus assay is intended as an aid in the diagnosis of influenza from Nasopharyngeal swab specimens and should not be used as a sole basis for treatment. Nasal washings and aspirates are unacceptable for Xpert Xpress SARS-CoV-2/FLU/RSV testing.  Fact Sheet for Patients: EntrepreneurPulse.com.au  Fact Sheet for Healthcare Providers: IncredibleEmployment.be  This test is not yet approved or cleared by the [place] FDA and has been authorized for detection and/or diagnosis of SARS-CoV-2 by FDA under an Emergency Use Authorization (EUA). This EUA will remain in effect (meaning this test can be used) for the duration of the COVID-19 declaration under Section 564(b)(1) of the Act, 21 U.S.C. section 360bbb-3(b)(1), unless the authorization is terminated or revoked.  Performed at [facility], [place] [address], [place], [place] [address]   Blood Culture (routine x 2)     Status: None (Preliminary result)   Collection Time: 12/12/21  4:55 AM   Specimen: BLOOD LEFT FOREARM  Result Value Ref Range Status   Specimen Description BLOOD LEFT FOREARM  Final   Special Requests   Final    BOTTLES DRAWN AEROBIC AND ANAEROBIC Blood Culture adequate volume   Culture   Final    NO GROWTH 4 DAYS Performed at [facility], [place] [address], [place], [place] [address]    Report Status PENDING  Incomplete  Urine Culture     Status: None   Collection Time: 12/12/21 12:20 PM   Specimen: In/Out Cath  Urine  Result Value Ref Range Status   Specimen Description IN/OUT CATH URINE  Final   Special Requests NONE  Final   Culture   Final    NO GROWTH Performed at [facility], [place] [address], [place], [place] [address]    Report Status 12/13/2021 FINAL  Final    FURTHER DISCHARGE INSTRUCTIONS:  Get Medicines reviewed and adjusted: Please take all your medications with you for your next visit with your Primary MD  Laboratory/radiological data: Please request your Primary MD to go over all hospital tests and procedure/radiological results at the follow up, please ask your Primary MD to get all Hospital records sent to his/her office.  In some cases, they will be  blood work, cultures and biopsy results pending at the time of your discharge. Please request that your primary care M.D. goes through all the records of your hospital data and follows up on these results.  Also Note the following: If you experience worsening of your admission symptoms, develop shortness of breath, life threatening emergency, suicidal or homicidal thoughts you must seek medical attention immediately by calling 911 or calling your MD immediately  if symptoms less severe.  You must read complete instructions/literature along with all the possible adverse reactions/side effects for all the Medicines you take and that have been prescribed to you. Take any new Medicines after you have completely understood and accpet all the possible adverse reactions/side effects.   Do not drive when taking Pain medications or sleeping medications (Benzodaizepines)  Do not take more than prescribed Pain, Sleep and Anxiety Medications. It is not advisable to combine anxiety,sleep and pain medications without talking with your primary care practitioner  Special Instructions: If you have smoked or chewed Tobacco  in the last 2 yrs please stop smoking, stop any regular Alcohol  and or any Recreational drug use.  Wear Seat belts  while driving.  Please note: You were cared for by a hospitalist during your hospital stay. Once you are discharged, your primary care physician will handle any further medical issues. Please note that NO REFILLS for any discharge medications will be authorized once you are discharged, as it is imperative that you return to your primary care physician (or establish a relationship with a primary care physician if you do not have one) for your post hospital discharge needs so that they can reassess your need for medications and monitor your lab values.  Total Time spent coordinating discharge including counseling, education and face to face time equals greater than 30 minutes.  SignedOren Binet 12/16/2021 2:29 PM

## 2021-12-17 LAB — CULTURE, BLOOD (ROUTINE X 2)
Culture: NO GROWTH
Culture: NO GROWTH
Special Requests: ADEQUATE
Special Requests: ADEQUATE

## 2022-01-19 ENCOUNTER — Other Ambulatory Visit: Payer: Medicare Other

## 2022-01-26 ENCOUNTER — Ambulatory Visit: Payer: Medicare Other | Admitting: Cardiology

## 2022-02-02 ENCOUNTER — Other Ambulatory Visit: Payer: Medicare Other

## 2022-02-08 IMAGING — DX DG CHEST 1V PORT
1 series · 1 of 1 positions shown · non-contrast
Comparison: Previous studies including the examination of
02/18/2021

CLINICAL DATA: Shortness of breath

EXAM:
PORTABLE CHEST 1 VIEW

[chest ap]
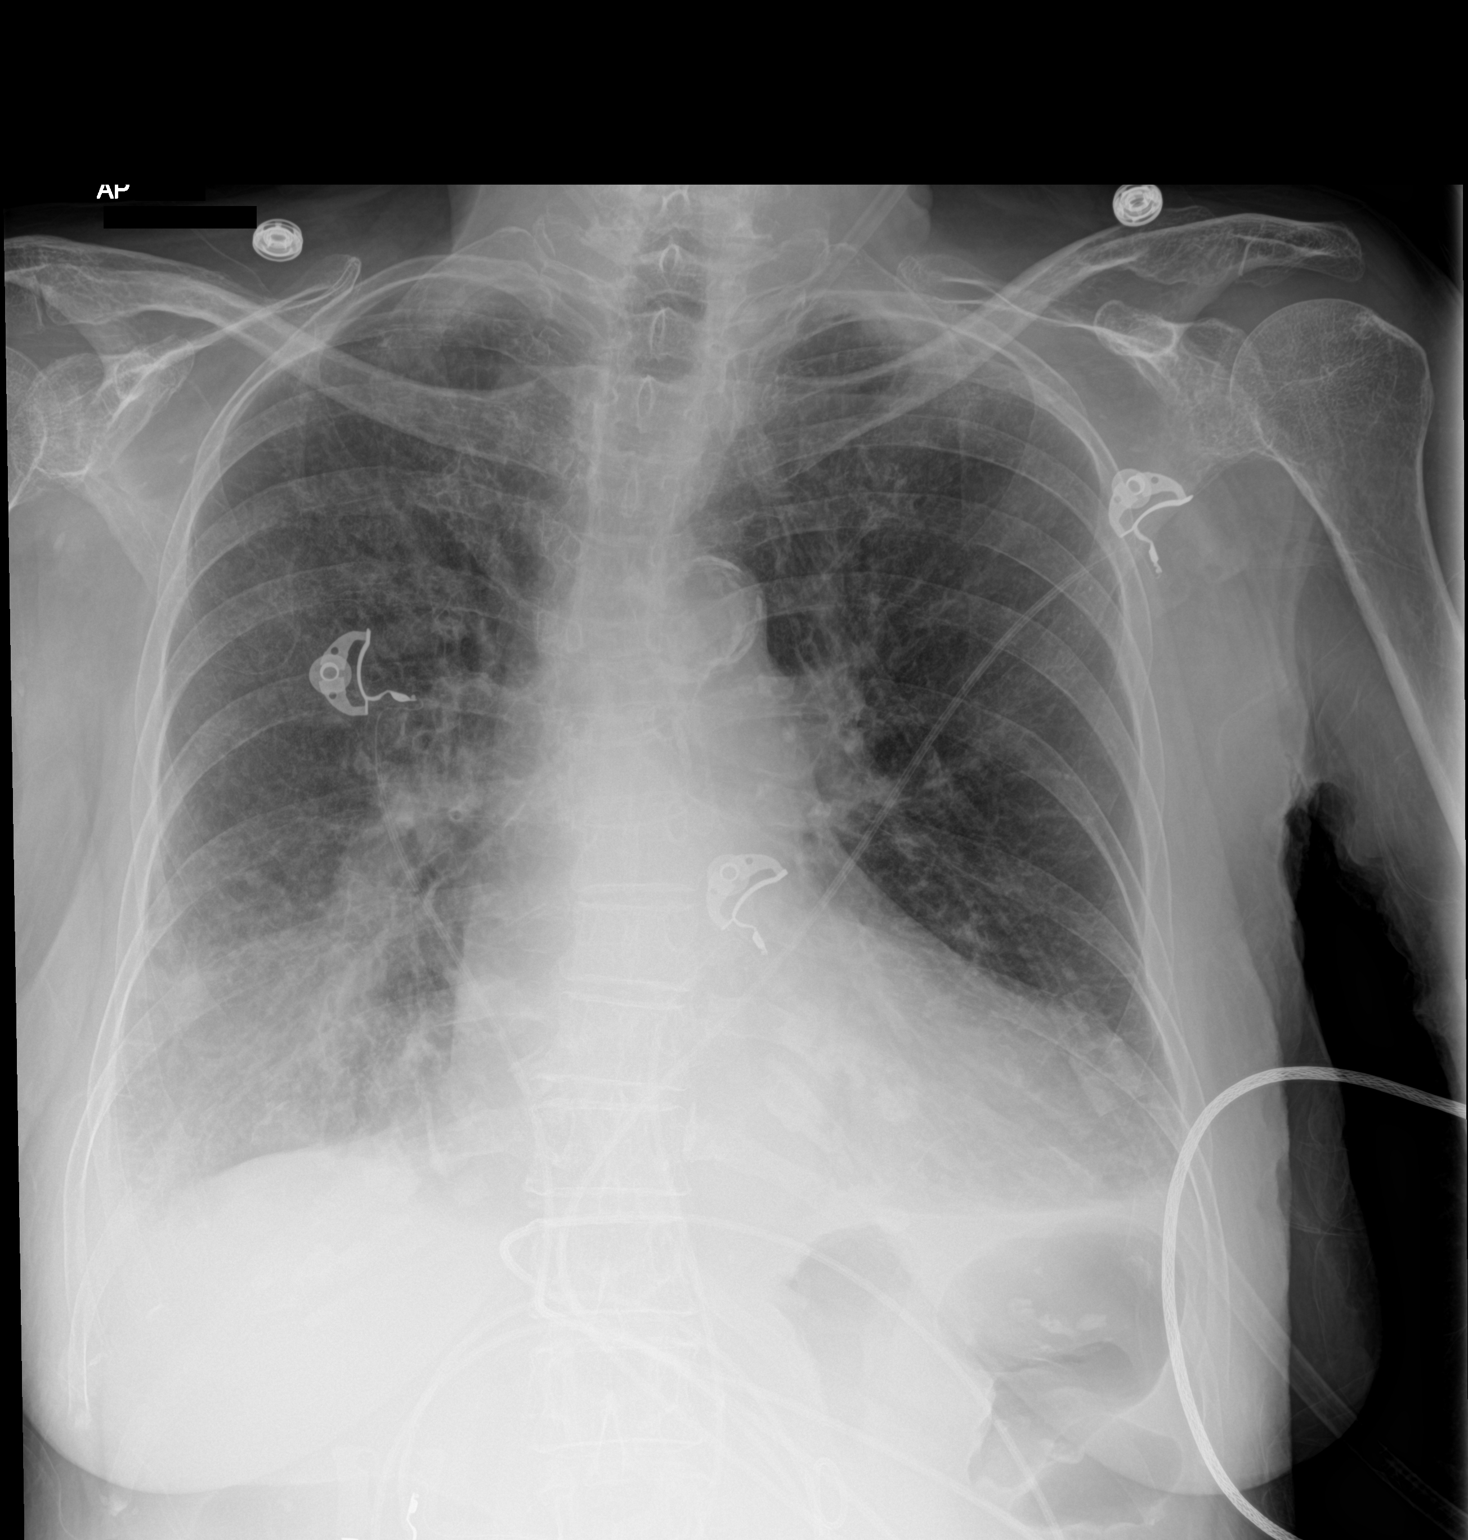

[1 of 1 positions shown; findings below may reference images not displayed]

FINDINGS: Transverse diameter of heart is increased. There are no signs of
alveolar pulmonary edema. Low position of diaphragms suggests
possible COPD. There is haziness in both lower lung fields
suggesting bilateral pleural effusions. Possibility of underlying
infiltrates is not excluded. There is slight prominence of
interstitial markings in the parahilar regions with interval
improvement in the left parahilar region. There is no pneumothorax.
IMPRESSION: Cardiomegaly. Bilateral pleural effusions, more so on the right
side. Possibility of underlying infiltrates is not excluded. There
is slight improvement in aeration of left parahilar region, possibly
suggesting decrease in interstitial edema.

## 2022-02-24 ENCOUNTER — Encounter: Payer: Self-pay | Admitting: Cardiology

## 2022-02-24 ENCOUNTER — Ambulatory Visit: Payer: Medicare Other | Admitting: Cardiology

## 2022-02-24 VITALS — BP 136/54 | HR 79 | Ht 61.0 in | Wt 86.0 lb

## 2022-02-24 DIAGNOSIS — I480 Paroxysmal atrial fibrillation: Secondary | ICD-10-CM

## 2022-02-24 DIAGNOSIS — I10 Essential (primary) hypertension: Secondary | ICD-10-CM

## 2022-02-24 DIAGNOSIS — I421 Obstructive hypertrophic cardiomyopathy: Secondary | ICD-10-CM

## 2022-02-24 DIAGNOSIS — K279 Peptic ulcer, site unspecified, unspecified as acute or chronic, without hemorrhage or perforation: Secondary | ICD-10-CM

## 2022-02-24 MED ORDER — DILTIAZEM HCL ER COATED BEADS 180 MG PO CP24: 180.0000 mg | ORAL_CAPSULE | Freq: Every day | ORAL | 3 refills | Status: DC

## 2022-02-24 NOTE — Progress Notes (Signed)
Primary Physician/Referring:  Joya Gaskins, FNP  Patient ID: Kelly [name], female    DOB: [birth year], 82 y.o.   MRN: [id number]  Chief Complaint  Patient presents with   Paroxysmal atrial fibrillation   Primary hypertension    HPI:    Kelly [name]  is a 82 y.o. Caucasian female patient with paroxysmal atrial fibrillation on chronic Eliquis therapy, hypertension, hyperlipidemia, chronic back pain and lumbar stenosis with radiculopathy, ongoing tobacco use disorder with COPD, excess alcohol drinking, PUD with UGIB on 09/21/2021. Eliquis restarted in OP basis.   She also presented with left central retinal artery occlusion and acute left-sided vision loss and underwent left carotid endarterectomy on 08/04/2021 at [facility]  She was admitted again at [facility] via emergency room with hypoxemic respiratory failure, recurrent GI bleed and received blood transfusion on 02/06/2022 EGD revealed nonbleeding superficial duodenal ulcer and gastritis.  In November 2023, when I evaluated her, she had stopped using NSAIDs and also had quit smoking and drinking alcohol after long discussion with me.  This is a 17-monthoffice visit.  She has remained abstinent from tobacco use, has now been occasionally drinking a glass of wine, is trying her best to take care of herself.  She has not had any recurrence of dark stools.  She is back on Eliquis.  Past Medical History:  Diagnosis Date   Anemia    Anxiety    Arthritis    B12 deficiency 05/2015   B12 was 184   Cancer (HCC)    CHF (congestive heart failure) (HCC)    COPD (chronic obstructive pulmonary disease) (HCC)    Crohn's disease, small intestine ([place] 02/19/2009   Depression 10/2013   chronic recurrent major depressive disorder   Dysrhythmia    H/O vitamin D deficiency 08/04/2008   Heart murmur    Hypertension    Hypothyroidism    Pneumonia    Recurrent dislocation of hip, right    Past Surgical History:   Procedure Laterality Date   BIOPSY  09/22/2021   Procedure: BIOPSY;  Surgeon: NMauri Pole MD;  [facility] [facility];  Service: Gastroenterology;;   CYSTOSCOPY WITH RETROGRADE PYELOGRAM, URETEROSCOPY AND STENT PLACEMENT Left 02/17/2021   Procedure: CYSTOSCOPY WITH  RETROGRADE PYELOGRAM, LEFT URETEROSCOPY , BIOPSY AND tumor ablation  STENT PLACEMENT;  Surgeon: MAlexis Frock MD;  Location: [facility];  Service: Urology;  Laterality: Left;   ESOPHAGOGASTRODUODENOSCOPY (EGD) WITH PROPOFOL N/A 09/22/2021   Procedure: ESOPHAGOGASTRODUODENOSCOPY (EGD) WITH PROPOFOL;  Surgeon: NMauri Pole MD;  [place]  Service: Gastroenterology;  Laterality: N/A;   HOLMIUM LASER APPLICATION Left 24/0/1027  Procedure: HOLMIUM LASER APPLICATION;  Surgeon: MAlexis Frock MD;  Location: [facility];  Service: Urology;  Laterality: Left;   JOINT REPLACEMENT     Right hip, 2015   LAPAROSCOPY  06/03/2016   Duodenal ulcer repair   TONSILLECTOMY     TUBAL LIGATION     Family History  Problem Relation Age of Onset   Heart disease Mother    Alcohol abuse Mother    Colon cancer Sister    Breast cancer Sister    Heart attack Brother    Cancer Neg Hx    Diabetes Neg Hx     Social History   Tobacco Use   Smoking status: Former    Packs/day: 1.00    Years: 58.00    Total pack years: 58.00    Types: Cigarettes    Quit date: 01/17/2022  Years since quitting: 0.1   Smokeless tobacco: Never   Tobacco comments:    trying to quit, wearing nicotine patch. Cautioned against smoking and wearing patch.1/4 ppd 03/18/21  Substance Use Topics   Alcohol use: Yes    Comment: h/o heavy use, not currently   Marital Status: Married  ROS  Review of Systems  Cardiovascular:  Positive for dyspnea on exertion. Negative for chest pain and leg swelling.  Respiratory:  Positive for cough and wheezing.    Objective  Blood pressure (!) 136/54, [name] 79, height '5\' 1"'$  (1.549 m), weight 86 lb (39 kg), SpO2 99  %. Body mass index is 16.25 kg/m.     02/24/2022   11:49 AM 12/13/2021    1:31 PM 12/13/2021   11:00 AM  Vitals with BMI  Height '5\' 1"'$     Weight 86 lbs    BMI 60.73    Systolic 710 626 948  Diastolic 54 52 46  [name] 79  79     Physical Exam Vitals reviewed.  Constitutional:      Appearance: She is underweight. She is ill-appearing.  Neck:     Vascular: Carotid bruit (bilateral) present. No JVD.  Cardiovascular:     Rate and Rhythm: Normal rate and regular rhythm.     Pulses:          Femoral pulses are 1+ on the right side and 2+ on the left side.      Popliteal pulses are 0 on the right side and 2+ on the left side.       Dorsalis pedis pulses are 0 on the right side and 0 on the left side.       Posterior tibial pulses are 0 on the right side and 0 on the left side.     Heart sounds: Murmur heard.     Harsh midsystolic murmur is present with a grade of 3/6 at the upper right sternal border and apex radiating to the neck.     No gallop.  Pulmonary:     Effort: Pulmonary effort is normal.     Breath sounds: Rales (occasional scattered) present.  Musculoskeletal:        General: No swelling.    Laboratory examination:   Lab Results  Component Value Date   NA 139 12/13/2021   K 3.7 12/13/2021   CO2 25 12/13/2021   GLUCOSE 143 (H) 12/13/2021   BUN 20 12/13/2021   CREATININE 0.98 12/13/2021   CALCIUM 8.3 (L) 12/13/2021   GFRNONAA 58 (L) 12/13/2021       Latest Ref Rng & Units 12/13/2021    4:45 AM 12/12/2021    7:58 AM 12/12/2021    4:11 AM  CMP  Glucose 70 - 99 mg/dL 143   165   BUN 8 - 23 mg/dL 20   15   Creatinine 0.44 - 1.00 mg/dL 0.98   1.06   Sodium 135 - 145 mmol/L 139  139  138   Potassium 3.5 - 5.1 mmol/L 3.7  4.1  4.1   Chloride 98 - 111 mmol/L 105   108   CO2 22 - 32 mmol/L 25   20   Calcium 8.9 - 10.3 mg/dL 8.3   8.6   Total Protein 6.5 - 8.1 g/dL   6.2   Total Bilirubin 0.3 - 1.2 mg/dL   0.4   Alkaline Phos 38 - 126 U/L   55   AST 15 - 41 U/L    51  ALT 0 - 44 U/L   30       Latest Ref Rng & Units 12/13/2021    4:45 AM 12/12/2021    7:58 AM 12/12/2021    4:11 AM  CBC  WBC 4.0 - 10.5 K/uL 6.9   9.0   Hemoglobin 12.0 - 15.0 g/dL 9.0  10.2  9.8   Hematocrit 36.0 - 46.0 % 26.8  30.0  30.5   Platelets 150 - 400 K/uL 159   176    Lipid Panel     Component Value Date/Time   CHOL 287 (H) 11/26/2021 0903   TRIG 166 (H) 11/26/2021 0903   HDL 55 11/26/2021 0903   LDLCALC 201 (H) 11/26/2021 0903    External labs:   Labs 02/06/2022:  Hb 6.2/HCT 19.2.  Hemoccult positive.  Labs 02/10/2022:  Hb 7.9/HCT 24.9, platelets 226.  Serum glucose 189, BUN 5, creatinine 0.9, EGFR 60 mL, potassium 3.6.  Labs 08/29/2021:  C-reactive protein 0.93.  Sodium 138, potassium 4.1, BUN 24, creatinine 1.0, EGFR 57.  mL.  Total cholesterol 209, triglycerides 81, HDL 46, LDL 144. Allergies   Allergies  Allergen Reactions   Bactrim [Sulfamethoxazole-Trimethoprim] Diarrhea and Nausea And Vomiting   Bee Venom Anaphylaxis   Trazodone Other (See Comments)    Severe weakness, unable to get up    Medications   Current Outpatient Medications:    acetaminophen (TYLENOL) 500 MG tablet, Take 1,000 mg by mouth every 6 (six) hours as needed for mild pain., Disp: , Rfl:    albuterol (VENTOLIN HFA) 108 (90 Base) MCG/ACT inhaler, Inhale 2 puffs into the lungs every 6 (six) hours as needed for wheezing or shortness of breath., Disp: 8 g, Rfl: 6   apraclonidine (IOPIDINE) 0.5 % ophthalmic solution, Place 1 drop into the left eye 3 (three) times daily., Disp: , Rfl:    carboxymethylcellulose (REFRESH PLUS) 0.5 % SOLN, Place 1 drop into both eyes 3 (three) times daily as needed (dry eyes)., Disp: , Rfl:    digoxin (LANOXIN) 0.125 MG tablet, Take 0.125 mg by mouth daily., Disp: , Rfl:    dorzolamide (TRUSOPT) 2 % ophthalmic solution, Place 1 drop into the left eye 3 (three) times daily., Disp: , Rfl:    DULoxetine (CYMBALTA) 60 MG capsule, Take 60 mg by  mouth daily., Disp: , Rfl:    ELIQUIS 2.5 MG TABS tablet, TAKE 1 TABLET(2.5 MG) BY MOUTH TWICE DAILY (Patient taking differently: Take 2.5 mg by mouth 2 (two) times daily.), Disp: 180 tablet, Rfl: 1   EPINEPHrine 0.3 mg/0.3 mL IJ SOAJ injection, Inject 0.3 mg into the muscle as needed for anaphylaxis., Disp: , Rfl:    ezetimibe (ZETIA) 10 MG tablet, Take 10 mg by mouth daily., Disp: , Rfl:    ferrous sulfate 325 (65 FE) MG EC tablet, Take 1 tablet (325 mg total) by mouth daily with breakfast., Disp: 90 tablet, Rfl: 1   Fluticasone-Umeclidin-Vilant (TRELEGY ELLIPTA) 100-62.5-25 MCG/ACT AEPB, Inhale 1 puff into the lungs daily., Disp: 1 each, Rfl: 11   folic acid (FOLVITE) 1 MG tablet, Take 1 mg by mouth daily., Disp: , Rfl:    furosemide (LASIX) 20 MG tablet, Take 20 mg by mouth daily., Disp: , Rfl:    latanoprost (XALATAN) 0.005 % ophthalmic solution, Place 1 drop into the left eye at bedtime., Disp: , Rfl:    metoprolol succinate (TOPROL-XL) 25 MG 24 hr tablet, Take 1 tablet (25 mg total) by mouth daily., Disp: 30 tablet, Rfl: 0  nicotine (NICODERM CQ - DOSED IN MG/24 HOURS) 21 mg/24hr patch, Place 1 patch (21 mg total) onto the skin daily. (Patient taking differently: Place 14 mg onto the skin daily.), Disp: 28 patch, Rfl: 0   OXYGEN, Inhale 1 L into the lungs daily as needed (shortness of breath)., Disp: , Rfl:    pregabalin (LYRICA) 75 MG capsule, Take 75 mg by mouth 3 (three) times daily., Disp: , Rfl:    rosuvastatin (CRESTOR) 40 MG tablet, Take 40 mg by mouth daily., Disp: , Rfl:    Spacer/Aero-Holding Chambers (VORTEX VALVED HOLDING CHAMBER) DEVI, by Does not apply route., Disp: , Rfl:    diltiazem (CARDIZEM CD) 180 MG 24 hr capsule, Take 1 capsule (180 mg total) by mouth daily., Disp: 90 capsule, Rfl: 3   REPATHA 140 MG/ML SOSY, INJECT 1 ML UNDER THE SKIN EVERY 14 DAYS AS DIRECTED (Patient not taking: Reported on 12/12/2021), Disp: 2 mL, Rfl: 3   traZODone (DESYREL) 100 MG tablet, Take 100  mg by mouth at bedtime as needed for sleep. (Patient not taking: Reported on 12/12/2021), Disp: , Rfl:    Radiology:   Chest x-ray PA and lateral view 06/11/2019: Cardiomegaly with atherosclerotic changes of the aorta. New small left pleural effusion. COPD changes with fine reticular prominence and hyperinflation.  Cardiac Studies:   PCV MYOCARDIAL PERFUSION WITH LEXISCAN 06/22/2020 Lexiscan nuclear stress test performed using 1-day protocol. Normal myocardial perfusion. Stress LVEF calculated 44%, but visually appears normal. Low risk study.   Carotid artery duplex 10/13/2021: Moderate scattered plaque in the bilateral carotid arteries. Borderline elevated velocities in the left internal carotid artery, with left ICA/CCA  ratio 2.04, suggesting 50-69% stenosis.  Left carotid artery endarterectomy site is patent. Normal right velocities and ratio. Bilateral antegrade vertebral artery flow.  Echocardiogram 07/29/2021:   NORMAL LEFT VENTRICULAR SYSTOLIC FUNCTION WITH SEVERE LVH. Hyperdynamic left ventricular systolic function >14% with severe left    ventricular hypertrophy - there is systolic anterior motion of the mitral  valve leaflet - pattern consistent with hypertrophic obstruction CM.    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 Resting gradient of 50 mmHg.    - There is severe mitral valve regurgitation present with SAM of the mitral valve.  Compared to office echocardiogram 07/06/2021, peak LVOT gradient was 89 mmHg and PA pressure was estimated at 41 mmHg.  EKG:   EKG 02/24/2022: Sinus rhythm/normal sinus rhythm at rate of 82 bpm, left atrial enlargement, normal axis.  LVH with repolarization abnormality, cannot exclude inferior and lateral ischemia, consider dig effect.  Compared to 11/30/2021, Ectopic atrial rhythm no longer present.    Assessment     ICD-10-CM   1. Paroxysmal atrial fibrillation (HCC)  I48.0 EKG 12-Lead     diltiazem (CARDIZEM CD) 180 MG 24 hr capsule    CANCELED: EKG 12-Lead    2. HOCM (hypertrophic obstructive cardiomyopathy) (HCC)  I42.1 diltiazem (CARDIZEM CD) 180 MG 24 hr capsule    3. Primary hypertension  I10     4. PUD (peptic ulcer disease)  K27.9       CHA2DS2-VASc Score is 5.  Yearly risk of stroke: 7.2% (F, A, HTN, Vasc Dz).  Score of 1=0.6; 2=2.2; 3=3.2; 4=4.8; 5=7.2; 6=9.8; 7=>9.8) -(CHF; HTN; vasc disease DM,  Female = 1; Age <65 =0; 65-74 = 1,  >75 =2; stroke/embolism= 2).    Meds ordered this encounter  Medications   diltiazem (CARDIZEM CD) 180 MG 24 hr  capsule    Sig: Take 1 capsule (180 mg total) by mouth daily.    Dispense:  90 capsule    Refill:  3    Discontinue Digoxin   Orders Placed This Encounter  Procedures   EKG 12-Lead   Recommendations:   Kelly [name] is a 82 y.o. 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 Eliquis restarted in OP basis.   She also presented with left central retinal artery occlusion and acute left-sided vision loss and underwent left carotid endarterectomy on 08/04/2021 at [facility].    She was admitted again at [facility] via emergency room with hypoxemic respiratory failure, recurrent GI bleed and received blood transfusion on 02/06/2022 EGD revealed nonbleeding superficial duodenal ulcer and gastritis.  1. Paroxysmal atrial fibrillation [facility]) Patient is maintaining sinus rhythm.  She is back on Eliquis, fortunately her GI bleed revealed essentially a nonhealing ulcer.  She is back on PPI, she has not used any aspirin or NSAID products and has been very compliant with her medications.  She only takes Tylenol and Lyrica  for her chronic pain syndrome.  2. HOCM (hypertrophic obstructive cardiomyopathy) ([place]) In view of HOCM, I will increase diltiazem from 120 mg to 180 mg daily, she is also on a small dose of metoprolol which we will continue, would like to reduce the heart rate as much as possible to the lowest level to improve both diastolic compliance and mitral regurgitation due to [facility].  I would like to repeat echocardiogram when she is clinically stable.  3. Primary hypertension Blood pressure is well-controlled.  4. PUD (peptic ulcer disease) As dictated above, she has had recurrent GI bleed, she is severely anemic, I will request her PCP to check CBC again and also iron levels and would strongly recommend and consider IV iron infusion if iron deficient.  I would like to see her back in 2 months for follow-up at which time I may consider repeating echocardiogram.  This was a 40-minute office visit encounter in review of external records, hospitalization records, reconciliation of her labs and discussion with the patient and her son-in-law at the bedside.    Adrian Prows, MD, [facility] 02/24/2022, 12:42 PM Office: [phone] Fax: [phone] Pager: [phone]

## 2022-02-24 NOTE — Progress Notes (Deleted)
EKG 02/24/2022: Sinus rhythm/normal sinus rhythm at rate of 82 bpm, left atrial enlargement, normal axis.  LVH with repolarization abnormality, cannot exclude inferior and lateral ischemia, consider dig effect.  Compared to 11/30/2021, Ectopic atrial rhythm no longer present

## 2022-03-02 ENCOUNTER — Ambulatory Visit: Payer: Medicare Other | Admitting: Cardiology

## 2022-03-15 ENCOUNTER — Other Ambulatory Visit: Payer: Self-pay | Admitting: Cardiology

## 2022-04-25 ENCOUNTER — Ambulatory Visit: Payer: Medicare Other | Admitting: Cardiology

## 2022-05-16 ENCOUNTER — Ambulatory Visit: Payer: Medicare Other | Admitting: Cardiology

## 2022-05-16 ENCOUNTER — Encounter: Payer: Self-pay | Admitting: Cardiology

## 2022-05-16 VITALS — BP 122/60 | HR 73 | Ht 61.0 in | Wt 100.2 lb

## 2022-05-16 DIAGNOSIS — I421 Obstructive hypertrophic cardiomyopathy: Secondary | ICD-10-CM

## 2022-05-16 DIAGNOSIS — I480 Paroxysmal atrial fibrillation: Secondary | ICD-10-CM

## 2022-05-16 DIAGNOSIS — I6523 Occlusion and stenosis of bilateral carotid arteries: Secondary | ICD-10-CM

## 2022-05-16 MED ORDER — DILTIAZEM HCL ER COATED BEADS 240 MG PO CP24
240.0000 mg | ORAL_CAPSULE | Freq: Every day | ORAL | 3 refills | Status: DC
Start: 2022-05-16 — End: 2022-12-19

## 2022-05-16 NOTE — Progress Notes (Signed)
Primary Physician/Referring:  Trisha Mangle, FNP  Patient ID: Kelly [name], female    DOB: 1940-06-10, 82 y.o.   MRN: [id number]  Chief Complaint  Patient presents with   HOCM (hypertrophic obstructive cardiomyopathy) (HCC)   Follow-up    HPI:    Kelly [name]  is a 82 y.o. Caucasian female patient with paroxysmal atrial fibrillation on chronic Eliquis therapy, hypertension, hyperlipidemia, chronic back pain and lumbar stenosis with radiculopathy, ongoing tobacco use disorder with COPD, excess alcohol drinking, PUD with UGIB on 09/21/2021. Eliquis restarted in OP basis.   She also presented with left central retinal artery occlusion and acute left-sided vision loss and underwent left carotid endarterectomy on 08/04/2021 at [facility].  She was admitted again at [facility] via emergency room with hypoxemic respiratory failure, recurrent GI bleed and received blood transfusion on 02/06/2022 EGD revealed nonbleeding superficial duodenal ulcer and gastritis.  Since she established with me, she has stopped using NSAIDs, she is already quit smoking and drinking heavy alcohol, but now admits that she has been smoking about 2 to at most 3 cigarettes a week and may be 2-3 glasses of wine in a month at most.  She is overall feeling well.  States that since she started iron infusions, her energy level seems to improved.  Trying her best to take care of herself.  She has not had any recurrence of dark stools.  She is back on Eliquis.  Past Medical History:  Diagnosis Date   Anemia    Anxiety    Arthritis    B12 deficiency 05/2015   B12 was 184   Cancer (HCC)    CHF (congestive heart failure) (HCC)    COPD (chronic obstructive pulmonary disease) (HCC)    Crohn's disease, small intestine (HCC) 02/19/2009   Depression 10/2013   chronic recurrent major depressive disorder   Dysrhythmia    H/O vitamin D deficiency 08/04/2008   Heart murmur    Hypertension     Hypothyroidism    Pneumonia    Recurrent dislocation of hip, right    Past Surgical History:  Procedure Laterality Date   BIOPSY  09/22/2021   Procedure: BIOPSY;  Surgeon: Napoleon Form, MD;  Location: [facility];  Service: Gastroenterology;;   CYSTOSCOPY WITH RETROGRADE PYELOGRAM, URETEROSCOPY AND STENT PLACEMENT Left 02/17/2021   Procedure: CYSTOSCOPY WITH  RETROGRADE PYELOGRAM, LEFT URETEROSCOPY , BIOPSY AND tumor ablation  STENT PLACEMENT;  Surgeon: Sebastian Ache, MD;  Location: [facility];  Service: Urology;  Laterality: Left;   ESOPHAGOGASTRODUODENOSCOPY (EGD) WITH PROPOFOL N/A 09/22/2021   Procedure: ESOPHAGOGASTRODUODENOSCOPY (EGD) WITH PROPOFOL;  Surgeon: Napoleon Form, MD;  Location: [facility];  Service: Gastroenterology;  Laterality: N/A;   HOLMIUM LASER APPLICATION Left 02/17/2021   Procedure: HOLMIUM LASER APPLICATION;  Surgeon: Sebastian Ache, MD;  Location: [facility];  Service: Urology;  Laterality: Left;   JOINT REPLACEMENT     Right hip, 2015   LAPAROSCOPY  06/03/2016   Duodenal ulcer repair   TONSILLECTOMY     TUBAL LIGATION     Family History  Problem Relation Age of Onset   Heart disease Mother    Alcohol abuse Mother    Colon cancer Sister    Breast cancer Sister    Heart attack Brother    Cancer Neg Hx    Diabetes Neg Hx     Social History   Tobacco Use   Smoking status: Former    Packs/day: 1.00    Years: 58.00  Additional pack years: 0.00    Total pack years: 58.00    Types: Cigarettes    Quit date: 01/17/2022    Years since quitting: 0.3   Smokeless tobacco: Never   Tobacco comments:    trying to quit, wearing nicotine patch. Cautioned against smoking and wearing patch.1/4 ppd 03/18/21  Substance Use Topics   Alcohol use: Yes    Comment: h/o heavy use, not currently   Marital Status: Married  ROS  Review of Systems  Cardiovascular:  Positive for dyspnea on exertion. Negative for chest pain and leg swelling.  Respiratory:  Positive for  cough and wheezing.    Objective  Blood pressure 122/60, pulse 73, height 5\' 1"  (1.549 m), weight 100 lb 3.2 oz (45.5 kg), SpO2 95 %. Body mass index is 18.93 kg/m.     05/16/2022    2:08 PM 02/24/2022   11:49 AM 12/13/2021    1:31 PM  Vitals with BMI  Height 5\' 1"  5\' 1"    Weight 100 lbs 3 oz 86 lbs   BMI 18.94 16.26   Systolic 122 136 811  Diastolic 60 54 52  Pulse 73 79      Physical Exam Vitals reviewed.  Constitutional:      Appearance: She is underweight. She is ill-appearing.  Neck:     Vascular: Carotid bruit (bilateral) present. No JVD.  Cardiovascular:     Rate and Rhythm: Normal rate and regular rhythm.     Pulses:          Femoral pulses are 1+ on the right side and 2+ on the left side.      Popliteal pulses are 0 on the right side and 2+ on the left side.       Dorsalis pedis pulses are 0 on the right side and 0 on the left side.       Posterior tibial pulses are 0 on the right side and 0 on the left side.     Heart sounds: Murmur heard.     Harsh midsystolic murmur is present with a grade of 3/6 at the upper right sternal border and apex radiating to the neck.     No gallop.  Pulmonary:     Effort: Pulmonary effort is normal.     Breath sounds: Rales (occasional scattered) present.  Musculoskeletal:     Right lower leg: No edema.     Left lower leg: No edema.    Laboratory examination:   Lab Results  Component Value Date   NA 139 12/13/2021   K 3.7 12/13/2021   CO2 25 12/13/2021   GLUCOSE 143 (H) 12/13/2021   BUN 20 12/13/2021   CREATININE 0.98 12/13/2021   CALCIUM 8.3 (L) 12/13/2021   GFRNONAA 58 (L) 12/13/2021       Latest Ref Rng & Units 12/13/2021    4:45 AM 12/12/2021    7:58 AM 12/12/2021    4:11 AM  CMP  Glucose 70 - 99 mg/dL 914   782   BUN 8 - 23 mg/dL 20   15   Creatinine 9.56 - 1.00 mg/dL 2.13   0.86   Sodium 578 - 145 mmol/L 139  139  138   Potassium 3.5 - 5.1 mmol/L 3.7  4.1  4.1   Chloride 98 - 111 mmol/L 105   108   CO2 22 -  32 mmol/L 25   20   Calcium 8.9 - 10.3 mg/dL 8.3   8.6   Total Protein  6.5 - 8.1 g/dL   6.2   Total Bilirubin 0.3 - 1.2 mg/dL   0.4   Alkaline Phos 38 - 126 U/L   55   AST 15 - 41 U/L   51   ALT 0 - 44 U/L   30       Latest Ref Rng & Units 12/13/2021    4:45 AM 12/12/2021    7:58 AM 12/12/2021    4:11 AM  CBC  WBC 4.0 - 10.5 K/uL 6.9   9.0   Hemoglobin 12.0 - 15.0 g/dL 9.0  62.9  9.8   Hematocrit 36.0 - 46.0 % 26.8  30.0  30.5   Platelets 150 - 400 K/uL 159   176    Lipid Panel     Component Value Date/Time   CHOL 287 (H) 11/26/2021 0903   TRIG 166 (H) 11/26/2021 0903   HDL 55 11/26/2021 0903   LDLCALC 201 (H) 11/26/2021 0903    External labs:   Labs 02/06/2022:  Hb 6.2/HCT 19.2.  Hemoccult positive.  Labs 02/10/2022:  Hb 7.9/HCT 24.9, platelets 226.  Serum glucose 189, BUN 5, creatinine 0.9, EGFR 60 mL, potassium 3.6.  Labs 08/29/2021:  C-reactive protein 0.93.  Sodium 138, potassium 4.1, BUN 24, creatinine 1.0, EGFR 57.  mL.  Total cholesterol 209, triglycerides 81, HDL 46, LDL 144. Allergies   Allergies  Allergen Reactions   Bactrim [Sulfamethoxazole-Trimethoprim] Diarrhea and Nausea And Vomiting   Bee Venom Anaphylaxis   Trazodone Other (See Comments)    Severe weakness, unable to get up    Medications   Current Outpatient Medications:    acetaminophen (TYLENOL) 500 MG tablet, Take 1,000 mg by mouth every 6 (six) hours as needed for mild pain., Disp: , Rfl:    albuterol (VENTOLIN HFA) 108 (90 Base) MCG/ACT inhaler, Inhale 2 puffs into the lungs every 6 (six) hours as needed for wheezing or shortness of breath., Disp: 8 g, Rfl: 6   apraclonidine (IOPIDINE) 0.5 % ophthalmic solution, Place 1 drop into the left eye 3 (three) times daily., Disp: , Rfl:    carboxymethylcellulose (REFRESH PLUS) 0.5 % SOLN, Place 1 drop into both eyes 3 (three) times daily as needed (dry eyes)., Disp: , Rfl:    dorzolamide (TRUSOPT) 2 % ophthalmic solution, Place 1 drop into  the left eye 3 (three) times daily., Disp: , Rfl:    DULoxetine (CYMBALTA) 60 MG capsule, Take 60 mg by mouth daily., Disp: , Rfl:    ELIQUIS 2.5 MG TABS tablet, TAKE 1 TABLET(2.5 MG) BY MOUTH TWICE DAILY, Disp: 180 tablet, Rfl: 1   EPINEPHrine 0.3 mg/0.3 mL IJ SOAJ injection, Inject 0.3 mg into the muscle as needed for anaphylaxis., Disp: , Rfl:    ezetimibe (ZETIA) 10 MG tablet, Take 10 mg by mouth daily., Disp: , Rfl:    ferrous sulfate 325 (65 FE) MG EC tablet, Take 1 tablet (325 mg total) by mouth daily with breakfast., Disp: 90 tablet, Rfl: 1   Fluticasone-Umeclidin-Vilant (TRELEGY ELLIPTA) 100-62.5-25 MCG/ACT AEPB, Inhale 1 puff into the lungs daily., Disp: 1 each, Rfl: 11   folic acid (FOLVITE) 1 MG tablet, Take 1 mg by mouth daily., Disp: , Rfl:    latanoprost (XALATAN) 0.005 % ophthalmic solution, Place 1 drop into the left eye at bedtime., Disp: , Rfl:    metoprolol succinate (TOPROL-XL) 25 MG 24 hr tablet, Take 1 tablet (25 mg total) by mouth daily., Disp: 30 tablet, Rfl: 0   nicotine (NICODERM CQ -  DOSED IN MG/24 HOURS) 21 mg/24hr patch, Place 1 patch (21 mg total) onto the skin daily. (Patient taking differently: Place 14 mg onto the skin daily.), Disp: 28 patch, Rfl: 0   OXYGEN, Inhale 1 L into the lungs daily as needed (shortness of breath)., Disp: , Rfl:    pregabalin (LYRICA) 75 MG capsule, Take 75 mg by mouth 3 (three) times daily., Disp: , Rfl:    rosuvastatin (CRESTOR) 40 MG tablet, Take 40 mg by mouth daily., Disp: , Rfl:    Spacer/Aero-Holding Chambers (VORTEX VALVED HOLDING CHAMBER) DEVI, by Does not apply route., Disp: , Rfl:    diltiazem (CARDIZEM CD) 240 MG 24 hr capsule, Take 1 capsule (240 mg total) by mouth daily., Disp: 90 capsule, Rfl: 3   Radiology:   Chest x-ray PA and lateral view 06/11/2019: Cardiomegaly with atherosclerotic changes of the aorta. New small left pleural effusion. COPD changes with fine reticular prominence and hyperinflation.  Cardiac  Studies:   PCV MYOCARDIAL PERFUSION WITH LEXISCAN 06/22/2020 Lexiscan nuclear stress test performed using 1-day protocol. Normal myocardial perfusion. Stress LVEF calculated 44%, but visually appears normal. Low risk study.   .Echocardiogram 07/29/2021:   NORMAL LEFT VENTRICULAR SYSTOLIC FUNCTION WITH SEVERE LVH. Hyperdynamic left ventricular systolic function >70% with severe left    ventricular hypertrophy - there is systolic anterior motion of the mitral  valve leaflet - pattern consistent with hypertrophic obstruction CM.    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 Resting gradient of 50 mmHg.    - There is severe mitral valve regurgitation present with SAM of the mitral valve.  Compared to office echocardiogram 07/06/2021, peak LVOT gradient was 89 mmHg and PA pressure was estimated at 41 mmHg.  Carotid artery duplex 04/13/2022 ([facility]): 1. Moderate scattered atherosclerotic plaques within the bilateral carotid artery. 2. Evidence of hemodynamically significant stenosis in the LEFT internal carotid artery. 3. No evidence of hemodynamic stenosis in the RIGHT internal carotid artery. 4.  Compared to our study 10/13/2021, does not appear to have any significant change.  Study suggests restenosis in the left carotid endarterectomy   EKG:   EKG 05/16/2022: Normal sinus rhythm at the rate of 67 bpm, left atrial enlargement, LVH with repolarization abnormality, cannot exclude inferior and lateral ischemia.  Compared to 08/25/2022, no change.  Assessment     ICD-10-CM   1. HOCM (hypertrophic obstructive cardiomyopathy) (HCC)  I42.1 EKG 12-Lead    diltiazem (CARDIZEM CD) 240 MG 24 hr capsule    CANCELED: EKG 12-Lead    2. Paroxysmal atrial fibrillation (HCC)  I48.0 diltiazem (CARDIZEM CD) 240 MG 24 hr capsule    3. Asymptomatic bilateral carotid artery stenosis  I65.23       CHA2DS2-VASc Score is 5.  Yearly risk of stroke: 7.2%  (F, A, HTN, Vasc Dz).  Score of 1=0.6; 2=2.2; 3=3.2; 4=4.8; 5=7.2; 6=9.8; 7=>9.8) -(CHF; HTN; vasc disease DM,  Female = 1; Age <65 =0; 65-74 = 1,  >75 =2; stroke/embolism= 2).    Meds ordered this encounter  Medications   diltiazem (CARDIZEM CD) 240 MG 24 hr capsule    Sig: Take 1 capsule (240 mg total) by mouth daily.    Dispense:  90 capsule    Refill:  3    Discontinue Digoxin   Orders Placed This Encounter  Procedures   EKG 12-Lead   Recommendations:   Kelly [name] is a 82 y.o. Caucasian female patient with hypertrophic obstructive cardiomyopathy with  severe MR due to [facility], paroxysmal atrial fibrillation on chronic Eliquis therapy, hypertension, hyperlipidemia, chronic back pain and lumbar stenosis with radiculopathy, ongoing tobacco use disorder with COPD, excess alcohol drinking, PUD with UGIB on 09/21/2021. Eliquis restarted in OP basis.   She also presented with left central retinal artery occlusion and acute left-sided vision loss and underwent left carotid endarterectomy on 08/04/2021 at [facility].    She was admitted again at [facility] via emergency room with hypoxemic respiratory failure, recurrent GI bleed and received blood transfusion on 02/06/2022 EGD revealed nonbleeding superficial duodenal ulcer and gastritis.  1. HOCM (hypertrophic obstructive cardiomyopathy) (HCC) Patient is presently doing well, I started her on diltiazem which she is tolerating, blood pressure is stable, will increase the dose to 240 mg daily from 180 mg daily to induce bradycardia so that we can reduce LVOT obstruction.  I will discontinue digoxin in view of HOCM. - EKG 12-Lead - diltiazem (CARDIZEM CD) 240 MG 24 hr capsule; Take 1 capsule (240 mg total) by mouth daily.  Dispense: 90 capsule; Refill: 3  2. Paroxysmal atrial fibrillation [facility]) Patient is maintaining sinus rhythm.  Continue anticoagulation with Eliquis, as dictated above will increase dose of diltiazem to 240  mg daily - diltiazem (CARDIZEM CD) 240 MG 24 hr capsule; Take 1 capsule (240 mg total) by mouth daily.  Dispense: 90 capsule; Refill: 3  3. Asymptomatic bilateral carotid artery stenosis I reviewed the results of the carotid artery duplex that was performed at [facility], she has moderate restenosis in the left carotid endarterectomy site, I will take over the monitoring of the carotid stenosis in our office, we will repeat the study in 6 months and I will see him back at that time.  Since patient established with me, she has almost completely quit drinking alcohol and used to drink heavy alcohol, no for the past 3 months has had about 2 to 3 glasses of wine, had been smoking about 1 pack of cigarettes a day, now smokes about 1 to 2 cigarettes in 2 weeks.  She appears motivated to make complete lifestyle changes.  I will see her back in 6 months for follow-up.  She is now getting iron infusions for GI bleed and chronic iron deficiency anemia.  She feels improved with regard to her overall energy and strength.    Kelly Decamp, MD, [facility] 05/16/2022, 2:33 PM Office: [phone] Fax: [phone] Pager: [phone]

## 2022-07-15 ENCOUNTER — Other Ambulatory Visit (HOSPITAL_BASED_OUTPATIENT_CLINIC_OR_DEPARTMENT_OTHER): Payer: Self-pay | Admitting: Nurse Practitioner

## 2022-07-15 ENCOUNTER — Ambulatory Visit (HOSPITAL_BASED_OUTPATIENT_CLINIC_OR_DEPARTMENT_OTHER)
Admission: RE | Admit: 2022-07-15 | Discharge: 2022-07-15 | Disposition: A | Discharge status: Home / Self Care | Payer: Medicare Other | Source: Ambulatory Visit | Attending: Nurse Practitioner | Admitting: Nurse Practitioner

## 2022-07-15 DIAGNOSIS — S0990XA Unspecified injury of head, initial encounter: Secondary | ICD-10-CM

## 2022-08-12 ENCOUNTER — Other Ambulatory Visit: Payer: Self-pay | Admitting: Cardiology

## 2022-08-31 NOTE — Telephone Encounter (Signed)
done

## 2022-09-28 ENCOUNTER — Other Ambulatory Visit: Payer: Self-pay

## 2022-09-28 DIAGNOSIS — I6523 Occlusion and stenosis of bilateral carotid arteries: Secondary | ICD-10-CM

## 2022-09-28 DIAGNOSIS — I10 Essential (primary) hypertension: Secondary | ICD-10-CM

## 2022-09-28 DIAGNOSIS — I480 Paroxysmal atrial fibrillation: Secondary | ICD-10-CM

## 2022-10-31 ENCOUNTER — Other Ambulatory Visit: Payer: Medicare Other

## 2022-11-15 ENCOUNTER — Ambulatory Visit: Payer: Self-pay | Admitting: Cardiology

## 2022-11-26 ENCOUNTER — Other Ambulatory Visit: Payer: Self-pay | Admitting: Cardiology

## 2022-11-28 NOTE — Telephone Encounter (Signed)
Prescription refill request for Eliquis received. Indication:afib Last office visit:4/24 Scr:0.98  6/24 Age: 82 Weight:45.5  kg  Prescription refilled

## 2022-12-12 ENCOUNTER — Inpatient Hospital Stay (HOSPITAL_COMMUNITY)
Admission: EM | Admit: 2022-12-12 | Discharge: 2022-12-19 | DRG: 193 | Disposition: A | Discharge status: Home / Self Care | Payer: Medicare Other | Attending: Internal Medicine | Admitting: Internal Medicine

## 2022-12-12 ENCOUNTER — Other Ambulatory Visit: Payer: Self-pay

## 2022-12-12 ENCOUNTER — Emergency Department (HOSPITAL_COMMUNITY): Payer: Medicare Other

## 2022-12-12 ENCOUNTER — Encounter (HOSPITAL_COMMUNITY): Payer: Self-pay | Admitting: Emergency Medicine

## 2022-12-12 ENCOUNTER — Inpatient Hospital Stay (HOSPITAL_COMMUNITY): Payer: Medicare Other

## 2022-12-12 DIAGNOSIS — I340 Nonrheumatic mitral (valve) insufficiency: Secondary | ICD-10-CM

## 2022-12-12 DIAGNOSIS — J9601 Acute respiratory failure with hypoxia: Secondary | ICD-10-CM | POA: Diagnosis present

## 2022-12-12 DIAGNOSIS — Z8249 Family history of ischemic heart disease and other diseases of the circulatory system: Secondary | ICD-10-CM | POA: Diagnosis not present

## 2022-12-12 DIAGNOSIS — F339 Major depressive disorder, recurrent, unspecified: Secondary | ICD-10-CM | POA: Diagnosis present

## 2022-12-12 DIAGNOSIS — I517 Cardiomegaly: Secondary | ICD-10-CM | POA: Diagnosis present

## 2022-12-12 DIAGNOSIS — D509 Iron deficiency anemia, unspecified: Secondary | ICD-10-CM | POA: Diagnosis present

## 2022-12-12 DIAGNOSIS — M1990 Unspecified osteoarthritis, unspecified site: Secondary | ICD-10-CM | POA: Diagnosis present

## 2022-12-12 DIAGNOSIS — Z87891 Personal history of nicotine dependence: Secondary | ICD-10-CM | POA: Diagnosis not present

## 2022-12-12 DIAGNOSIS — I110 Hypertensive heart disease with heart failure: Secondary | ICD-10-CM | POA: Diagnosis present

## 2022-12-12 DIAGNOSIS — K5000 Crohn's disease of small intestine without complications: Secondary | ICD-10-CM | POA: Diagnosis present

## 2022-12-12 DIAGNOSIS — Z888 Allergy status to other drugs, medicaments and biological substances status: Secondary | ICD-10-CM

## 2022-12-12 DIAGNOSIS — M24451 Recurrent dislocation, right hip: Secondary | ICD-10-CM | POA: Diagnosis present

## 2022-12-12 DIAGNOSIS — Z79899 Other long term (current) drug therapy: Secondary | ICD-10-CM

## 2022-12-12 DIAGNOSIS — Z7951 Long term (current) use of inhaled steroids: Secondary | ICD-10-CM

## 2022-12-12 DIAGNOSIS — R911 Solitary pulmonary nodule: Secondary | ICD-10-CM | POA: Diagnosis present

## 2022-12-12 DIAGNOSIS — I4719 Other supraventricular tachycardia: Secondary | ICD-10-CM | POA: Diagnosis present

## 2022-12-12 DIAGNOSIS — E785 Hyperlipidemia, unspecified: Secondary | ICD-10-CM | POA: Diagnosis present

## 2022-12-12 DIAGNOSIS — T501X5A Adverse effect of loop [high-ceiling] diuretics, initial encounter: Secondary | ICD-10-CM | POA: Diagnosis not present

## 2022-12-12 DIAGNOSIS — R0902 Hypoxemia: Secondary | ICD-10-CM | POA: Diagnosis not present

## 2022-12-12 DIAGNOSIS — E039 Hypothyroidism, unspecified: Secondary | ICD-10-CM | POA: Diagnosis present

## 2022-12-12 DIAGNOSIS — R0602 Shortness of breath: Secondary | ICD-10-CM | POA: Diagnosis present

## 2022-12-12 DIAGNOSIS — Z9981 Dependence on supplemental oxygen: Secondary | ICD-10-CM | POA: Diagnosis not present

## 2022-12-12 DIAGNOSIS — N179 Acute kidney failure, unspecified: Secondary | ICD-10-CM | POA: Diagnosis present

## 2022-12-12 DIAGNOSIS — I480 Paroxysmal atrial fibrillation: Secondary | ICD-10-CM | POA: Diagnosis present

## 2022-12-12 DIAGNOSIS — G8929 Other chronic pain: Secondary | ICD-10-CM | POA: Diagnosis present

## 2022-12-12 DIAGNOSIS — Z91030 Bee allergy status: Secondary | ICD-10-CM | POA: Diagnosis not present

## 2022-12-12 DIAGNOSIS — Z8711 Personal history of peptic ulcer disease: Secondary | ICD-10-CM | POA: Diagnosis not present

## 2022-12-12 DIAGNOSIS — I5033 Acute on chronic diastolic (congestive) heart failure: Secondary | ICD-10-CM | POA: Diagnosis present

## 2022-12-12 DIAGNOSIS — D649 Anemia, unspecified: Secondary | ICD-10-CM

## 2022-12-12 DIAGNOSIS — I421 Obstructive hypertrophic cardiomyopathy: Secondary | ICD-10-CM | POA: Diagnosis present

## 2022-12-12 DIAGNOSIS — J189 Pneumonia, unspecified organism: Principal | ICD-10-CM

## 2022-12-12 DIAGNOSIS — I952 Hypotension due to drugs: Secondary | ICD-10-CM | POA: Diagnosis not present

## 2022-12-12 DIAGNOSIS — Z882 Allergy status to sulfonamides status: Secondary | ICD-10-CM

## 2022-12-12 DIAGNOSIS — Z7901 Long term (current) use of anticoagulants: Secondary | ICD-10-CM

## 2022-12-12 DIAGNOSIS — M549 Dorsalgia, unspecified: Secondary | ICD-10-CM | POA: Diagnosis present

## 2022-12-12 DIAGNOSIS — Z8719 Personal history of other diseases of the digestive system: Secondary | ICD-10-CM

## 2022-12-12 DIAGNOSIS — J440 Chronic obstructive pulmonary disease with acute lower respiratory infection: Secondary | ICD-10-CM | POA: Diagnosis present

## 2022-12-12 DIAGNOSIS — F419 Anxiety disorder, unspecified: Secondary | ICD-10-CM | POA: Diagnosis present

## 2022-12-12 DIAGNOSIS — I5031 Acute diastolic (congestive) heart failure: Secondary | ICD-10-CM

## 2022-12-12 DIAGNOSIS — R231 Pallor: Secondary | ICD-10-CM | POA: Diagnosis present

## 2022-12-12 LAB — IRON AND TIBC
Iron: 24 ug/dL — ABNORMAL LOW (ref 28–170)
Saturation Ratios: 5 % — ABNORMAL LOW (ref 10.4–31.8)
TIBC: 486 ug/dL — ABNORMAL HIGH (ref 250–450)
UIBC: 462 ug/dL

## 2022-12-12 LAB — CBC
HCT: 25.4 % — ABNORMAL LOW (ref 36.0–46.0)
Hemoglobin: 7.5 g/dL — ABNORMAL LOW (ref 12.0–15.0)
MCH: 31.3 pg (ref 26.0–34.0)
MCHC: 29.5 g/dL — ABNORMAL LOW (ref 30.0–36.0)
MCV: 105.8 fL — ABNORMAL HIGH (ref 80.0–100.0)
Platelets: 301 10*3/uL (ref 150–400)
RBC: 2.4 MIL/uL — ABNORMAL LOW (ref 3.87–5.11)
RDW: 16.1 % — ABNORMAL HIGH (ref 11.5–15.5)
WBC: 13 10*3/uL — ABNORMAL HIGH (ref 4.0–10.5)
nRBC: 0.3 % — ABNORMAL HIGH (ref 0.0–0.2)

## 2022-12-12 LAB — URINALYSIS, COMPLETE (UACMP) WITH MICROSCOPIC
Bilirubin Urine: NEGATIVE
Glucose, UA: 50 mg/dL — AB
Ketones, ur: NEGATIVE mg/dL
Nitrite: NEGATIVE
Protein, ur: 100 mg/dL — AB
RBC / HPF: >50 RBC/hpf (ref 0–5)
Specific Gravity, Urine: 1.016 (ref 1.005–1.030)
pH: 5 (ref 5.0–8.0)

## 2022-12-12 LAB — BLOOD GAS, VENOUS
Acid-base deficit: 6.4 mmol/L — ABNORMAL HIGH (ref 0.0–2.0)
Bicarbonate: 20.2 mmol/L (ref 20.0–28.0)
O2 Saturation: 71.1 %
Patient temperature: 37
pCO2, Ven: 43 mm[Hg] — ABNORMAL LOW (ref 44–60)
pH, Ven: 7.28 (ref 7.25–7.43)
pO2, Ven: 44 mm[Hg] (ref 32–45)

## 2022-12-12 LAB — CBC WITH DIFFERENTIAL/PLATELET
Abs Immature Granulocytes: 0.07 10*3/uL (ref 0.00–0.07)
Basophils Absolute: 0 10*3/uL (ref 0.0–0.1)
Basophils Relative: 0 %
Eosinophils Absolute: 0 10*3/uL (ref 0.0–0.5)
Eosinophils Relative: 0 %
HCT: 25.2 % — ABNORMAL LOW (ref 36.0–46.0)
Hemoglobin: 7.7 g/dL — ABNORMAL LOW (ref 12.0–15.0)
Immature Granulocytes: 1 %
Lymphocytes Relative: 5 %
Lymphs Abs: 0.7 10*3/uL (ref 0.7–4.0)
MCH: 31.7 pg (ref 26.0–34.0)
MCHC: 30.6 g/dL (ref 30.0–36.0)
MCV: 103.7 fL — ABNORMAL HIGH (ref 80.0–100.0)
Monocytes Absolute: 0.5 10*3/uL (ref 0.1–1.0)
Monocytes Relative: 4 %
Neutro Abs: 12 10*3/uL — ABNORMAL HIGH (ref 1.7–7.7)
Neutrophils Relative %: 90 %
Platelets: 280 10*3/uL (ref 150–400)
RBC: 2.43 MIL/uL — ABNORMAL LOW (ref 3.87–5.11)
RDW: 16 % — ABNORMAL HIGH (ref 11.5–15.5)
WBC: 13.3 10*3/uL — ABNORMAL HIGH (ref 4.0–10.5)
nRBC: 0 % (ref 0.0–0.2)

## 2022-12-12 LAB — VITAMIN B12: Vitamin B-12: 290 pg/mL (ref 180–914)

## 2022-12-12 LAB — BRAIN NATRIURETIC PEPTIDE: B Natriuretic Peptide: 1067.4 pg/mL — ABNORMAL HIGH (ref 0.0–100.0)

## 2022-12-12 LAB — RETICULOCYTES
Immature Retic Fract: 33.2 % — ABNORMAL HIGH (ref 2.3–15.9)
RBC.: 2.36 MIL/uL — ABNORMAL LOW (ref 3.87–5.11)
Retic Count, Absolute: 125.8 10*3/uL (ref 19.0–186.0)
Retic Ct Pct: 5.3 % — ABNORMAL HIGH (ref 0.4–3.1)

## 2022-12-12 LAB — BASIC METABOLIC PANEL
Anion gap: 11 (ref 5–15)
BUN: 25 mg/dL — ABNORMAL HIGH (ref 8–23)
CO2: 20 mmol/L — ABNORMAL LOW (ref 22–32)
Calcium: 8.9 mg/dL (ref 8.9–10.3)
Chloride: 105 mmol/L (ref 98–111)
Creatinine, Ser: 1.39 mg/dL — ABNORMAL HIGH (ref 0.44–1.00)
GFR, Estimated: 38 mL/min — ABNORMAL LOW (ref >60)
Glucose, Bld: 225 mg/dL — ABNORMAL HIGH (ref 70–99)
Potassium: 3.8 mmol/L (ref 3.5–5.1)
Sodium: 136 mmol/L (ref 135–145)

## 2022-12-12 LAB — FOLATE: Folate: 44.8 ng/mL (ref >5.9)

## 2022-12-12 LAB — POC OCCULT BLOOD, ED: Fecal Occult Bld: NEGATIVE

## 2022-12-12 LAB — FERRITIN: Ferritin: 54 ng/mL (ref 11–307)

## 2022-12-12 LAB — RESP PANEL BY RT-PCR (RSV, FLU A&B, COVID)  RVPGX2
Influenza A by PCR: NEGATIVE
Influenza B by PCR: NEGATIVE
Resp Syncytial Virus by PCR: NEGATIVE
SARS Coronavirus 2 by RT PCR: NEGATIVE

## 2022-12-12 LAB — CREATININE, URINE, RANDOM: Creatinine, Urine: 148 mg/dL

## 2022-12-12 LAB — SODIUM, URINE, RANDOM: Sodium, Ur: 29 mmol/L

## 2022-12-12 MED ORDER — UMECLIDINIUM BROMIDE 62.5 MCG/ACT IN AEPB
1.0000 | INHALATION_SPRAY | Freq: Every day | RESPIRATORY_TRACT | Status: DC
  Administered 2022-12-14 – 2022-12-19 (×5): 1 via RESPIRATORY_TRACT
  Filled 2022-12-12 (×2): qty 7

## 2022-12-12 MED ORDER — METOPROLOL SUCCINATE ER 25 MG PO TB24
25.0000 mg | ORAL_TABLET | Freq: Every day | ORAL | Status: DC
  Administered 2022-12-13 – 2022-12-14 (×2): 25 mg via ORAL
  Filled 2022-12-12 (×2): qty 1

## 2022-12-12 MED ORDER — SODIUM CHLORIDE 0.9% IV SOLUTION: Freq: Once | INTRAVENOUS | Status: AC

## 2022-12-12 MED ORDER — ALBUTEROL SULFATE (2.5 MG/3ML) 0.083% IN NEBU
10.0000 mg/h | INHALATION_SOLUTION | Freq: Once | RESPIRATORY_TRACT | Status: AC
  Administered 2022-12-12: 10 mg/h via RESPIRATORY_TRACT
  Filled 2022-12-12: qty 12

## 2022-12-12 MED ORDER — MAGNESIUM SULFATE 2 GM/50ML IV SOLN
2.0000 g | INTRAVENOUS | Status: AC
  Administered 2022-12-12: 2 g via INTRAVENOUS
  Filled 2022-12-12: qty 50

## 2022-12-12 MED ORDER — FLUTICASONE FUROATE-VILANTEROL 100-25 MCG/ACT IN AEPB
1.0000 | INHALATION_SPRAY | Freq: Every day | RESPIRATORY_TRACT | Status: DC
  Administered 2022-12-14 – 2022-12-19 (×5): 1 via RESPIRATORY_TRACT
  Filled 2022-12-12 (×2): qty 28

## 2022-12-12 MED ORDER — EZETIMIBE 10 MG PO TABS
10.0000 mg | ORAL_TABLET | Freq: Every day | ORAL | Status: DC
  Administered 2022-12-13 – 2022-12-19 (×7): 10 mg via ORAL
  Filled 2022-12-12 (×7): qty 1

## 2022-12-12 MED ORDER — ALBUTEROL SULFATE (2.5 MG/3ML) 0.083% IN NEBU
2.5000 mg | INHALATION_SOLUTION | Freq: Three times a day (TID) | RESPIRATORY_TRACT | Status: DC
  Administered 2022-12-13 – 2022-12-15 (×7): 2.5 mg via RESPIRATORY_TRACT
  Filled 2022-12-12 (×6): qty 3

## 2022-12-12 MED ORDER — MAGNESIUM SULFATE 50 % IJ SOLN: 2.0000 g | Freq: Once | INTRAMUSCULAR | Status: DC

## 2022-12-12 MED ORDER — IPRATROPIUM BROMIDE 0.02 % IN SOLN
0.5000 mg | Freq: Once | RESPIRATORY_TRACT | Status: AC
Start: 2022-12-12 — End: 2022-12-12
  Administered 2022-12-12: 0.5 mg via RESPIRATORY_TRACT
  Filled 2022-12-12: qty 2.5

## 2022-12-12 MED ORDER — APIXABAN 2.5 MG PO TABS
2.5000 mg | ORAL_TABLET | Freq: Two times a day (BID) | ORAL | Status: DC
  Administered 2022-12-12 – 2022-12-19 (×14): 2.5 mg via ORAL
  Filled 2022-12-12 (×14): qty 1

## 2022-12-12 MED ORDER — SODIUM CHLORIDE 0.9% FLUSH
3.0000 mL | Freq: Two times a day (BID) | INTRAVENOUS | Status: DC
  Administered 2022-12-12 – 2022-12-19 (×12): 3 mL via INTRAVENOUS

## 2022-12-12 MED ORDER — ACETAMINOPHEN 650 MG RE SUPP: 650.0000 mg | Freq: Four times a day (QID) | RECTAL | Status: DC | PRN

## 2022-12-12 MED ORDER — POLYETHYLENE GLYCOL 3350 17 G PO PACK: 17.0000 g | PACK | Freq: Every day | ORAL | Status: DC | PRN

## 2022-12-12 MED ORDER — ENOXAPARIN SODIUM 40 MG/0.4ML IJ SOSY: 40.0000 mg | PREFILLED_SYRINGE | INTRAMUSCULAR | Status: DC

## 2022-12-12 MED ORDER — SODIUM CHLORIDE 0.9 % IV SOLN
500.0000 mg | Freq: Every day | INTRAVENOUS | Status: AC
  Administered 2022-12-12 – 2022-12-14 (×3): 500 mg via INTRAVENOUS
  Filled 2022-12-12 (×3): qty 5

## 2022-12-12 MED ORDER — IPRATROPIUM-ALBUTEROL 0.5-2.5 (3) MG/3ML IN SOLN
3.0000 mL | RESPIRATORY_TRACT | Status: DC
  Administered 2022-12-12 (×2): 3 mL via RESPIRATORY_TRACT
  Filled 2022-12-12 (×2): qty 3

## 2022-12-12 MED ORDER — ROSUVASTATIN CALCIUM 20 MG PO TABS
40.0000 mg | ORAL_TABLET | Freq: Every day | ORAL | Status: DC
  Administered 2022-12-12 – 2022-12-19 (×8): 40 mg via ORAL
  Filled 2022-12-12 (×8): qty 2

## 2022-12-12 MED ORDER — FUROSEMIDE 10 MG/ML IJ SOLN: 20.0000 mg | Freq: Once | INTRAMUSCULAR | Status: DC

## 2022-12-12 MED ORDER — SODIUM CHLORIDE 0.9 % IV SOLN
1.0000 g | Freq: Every day | INTRAVENOUS | Status: DC
  Administered 2022-12-12 – 2022-12-13 (×2): 1 g via INTRAVENOUS
  Filled 2022-12-12 (×2): qty 10

## 2022-12-12 MED ORDER — ACETAMINOPHEN 325 MG PO TABS
650.0000 mg | ORAL_TABLET | Freq: Four times a day (QID) | ORAL | Status: DC | PRN
  Administered 2022-12-14: 650 mg via ORAL
  Filled 2022-12-12: qty 2

## 2022-12-12 MED ORDER — DILTIAZEM HCL ER COATED BEADS 120 MG PO CP24
240.0000 mg | ORAL_CAPSULE | Freq: Every day | ORAL | Status: DC
  Administered 2022-12-13 – 2022-12-16 (×4): 240 mg via ORAL
  Filled 2022-12-12 (×4): qty 2

## 2022-12-12 NOTE — ED Notes (Signed)
ED TO INPATIENT HANDOFF REPORT  Name/Age/Gender Kelly [name] 82 y.o. female  Code Status    Code Status Orders  (From admission, onward)           Start     Ordered   12/12/22 1841  Full code  Continuous       Question:  By:  Answer:  Consent: discussion documented in EHR   12/12/22 1845           Code Status History     Date Active Date Inactive Code Status Order ID Comments User Context   12/12/2021 0843 12/13/2021 2340 Full Code [id number]  Jonah Blue, MD ED   09/21/2021 [phone] 09/23/2021 2206 DNR [id number]  Marolyn Haller, MD ED   02/28/2021 0318 02/28/2021 1733 Full Code [id number]  Howerter, Chaney Born, DO ED   02/17/2021 2335 02/21/2021 1946 Full Code [id number]  Sebastian Ache, MD Inpatient       Home/SNF/Other Home  Chief Complaint Hypoxia [R09.02]  Level of Care/Admitting Diagnosis ED Disposition     ED Disposition  Admit   Condition  --   Comment  Hospital Area: [facility] [[id number]]  Level of Care: Telemetry [5]  Admit to tele based on following criteria: Acute CHF  May admit patient to [facility] or [facility] if equivalent level of care is available:: No  Covid Evaluation: Symptomatic Person Under Investigation (PUI) or recent exposure (last 10 days) *Testing Required*  Diagnosis: Hypoxia [300808]  Admitting Physician: Nolberto Hanlon [[id number]]  Attending Physician: Nolberto Hanlon [[id number]]  Certification:: I certify this patient will need inpatient services for at least 2 midnights  Expected Medical Readiness: 12/15/2022          Medical History Past Medical History:  Diagnosis Date   Anemia    Anxiety    Arthritis    B12 deficiency 05/2015   B12 was 184   Cancer (HCC)    CHF (congestive heart failure) (HCC)    COPD (chronic obstructive pulmonary disease) (HCC)    Crohn's disease, small intestine (HCC) 02/19/2009   Depression 10/2013   chronic recurrent major depressive disorder   Dysrhythmia    H/O vitamin D  deficiency 08/04/2008   Heart murmur    Hypertension    Hypothyroidism    Pneumonia    Recurrent dislocation of hip, right     Allergies Allergies  Allergen Reactions   Bee Venom Anaphylaxis   Sulfamethoxazole-Trimethoprim Diarrhea and Nausea And Vomiting    Other Reaction(s): GI Intolerance   Trazodone Other (See Comments)    Severe weakness, unable to get up  Other Reaction(s): Fatique    IV Location/Drains/Wounds Patient Lines/Drains/Airways Status     Active Line/Drains/Airways     Name Placement date Placement time Site Days   Peripheral IV 12/12/22 20 G 1" Left;Posterior Forearm 12/12/22  1356  Forearm  less than 1   Ureteral Drain/Stent Left ureter 5 Fr. 02/17/21  1636  Left ureter  663            Labs/Imaging Results for orders placed or performed during the hospital encounter of 12/12/22 (from the past 48 hour(s))  Basic metabolic panel     Status: Abnormal   Collection Time: 12/12/22  2:53 PM  Result Value Ref Range   Sodium 136 135 - 145 mmol/L   Potassium 3.8 3.5 - 5.1 mmol/L   Chloride 105 98 - 111 mmol/L   CO2 20 (L) 22 - 32 mmol/L   Glucose, Bld 225 (H)  70 - 99 mg/dL    Comment: Glucose reference range applies only to samples taken after fasting for at least 8 hours.   BUN 25 (H) 8 - 23 mg/dL   Creatinine, Ser 6.38 (H) 0.44 - 1.00 mg/dL   Calcium 8.9 8.9 - 75.6 mg/dL   GFR, Estimated 38 (L) >60 mL/min    Comment: (NOTE) Calculated using the CKD-EPI Creatinine Equation (2021)    Anion gap 11 5 - 15    Comment: Performed at [facility], [address], [place], [place] [address]  CBC with Differential/Platelet     Status: Abnormal   Collection Time: 12/12/22  2:53 PM  Result Value Ref Range   WBC 13.3 (H) 4.0 - 10.5 K/uL   RBC 2.43 (L) 3.87 - 5.11 MIL/uL   Hemoglobin 7.7 (L) 12.0 - 15.0 g/dL   HCT 51.8 (L) 84.1 - 66.0 %   MCV 103.7 (H) 80.0 - 100.0 fL   MCH 31.7 26.0 - 34.0 pg   MCHC 30.6 30.0 - 36.0 g/dL   RDW 63.0 (H)  16.0 - 15.5 %   Platelets 280 150 - 400 K/uL   nRBC 0.0 0.0 - 0.2 %   Neutrophils Relative % 90 %   Neutro Abs 12.0 (H) 1.7 - 7.7 K/uL   Lymphocytes Relative 5 %   Lymphs Abs 0.7 0.7 - 4.0 K/uL   Monocytes Relative 4 %   Monocytes Absolute 0.5 0.1 - 1.0 K/uL   Eosinophils Relative 0 %   Eosinophils Absolute 0.0 0.0 - 0.5 K/uL   Basophils Relative 0 %   Basophils Absolute 0.0 0.0 - 0.1 K/uL   Immature Granulocytes 1 %   Abs Immature Granulocytes 0.07 0.00 - 0.07 K/uL    Comment: Performed at [facility], [address] [place], [place] [address]  Brain natriuretic peptide     Status: Abnormal   Collection Time: 12/12/22  2:53 PM  Result Value Ref Range   B Natriuretic Peptide 1,067.4 (H) 0.0 - 100.0 pg/mL    Comment: Performed at [facility], [address], [place], [place] [address]  Blood gas, venous     Status: Abnormal   Collection Time: 12/12/22  2:53 PM  Result Value Ref Range   pH, Ven 7.28 7.25 - 7.43   pCO2, Ven 43 (L) 44 - 60 mmHg   pO2, Ven 44 32 - 45 mmHg   Bicarbonate 20.2 20.0 - 28.0 mmol/L   Acid-base deficit 6.4 (H) 0.0 - 2.0 mmol/L   O2 Saturation 71.1 %   Patient temperature 37.0     Comment: Performed at [facility], [address], [place], [place] [address]  Resp panel by RT-PCR (RSV, Flu A&B, Covid) Anterior Nasal Swab     Status: None   Collection Time: 12/12/22  2:58 PM   Specimen: Anterior Nasal Swab  Result Value Ref Range   SARS Coronavirus 2 by RT PCR NEGATIVE NEGATIVE    Comment: (NOTE) SARS-CoV-2 target nucleic acids are NOT DETECTED.  The SARS-CoV-2 RNA is generally detectable in upper respiratory specimens during the acute phase of infection. The lowest concentration of SARS-CoV-2 viral copies this assay can detect is 138 copies/mL. A negative result does not preclude SARS-Cov-2 infection and should not be used as the sole basis for treatment or other patient management decisions.  A negative result may occur with  improper specimen collection/handling, submission of specimen other than nasopharyngeal swab, presence of viral mutation(s)  within the areas targeted by this assay, and inadequate number of viral copies(<138 copies/mL). A negative result must be combined with clinical observations, patient history, and epidemiological information. The expected result is Negative.  Fact Sheet for Patients:  BloggerCourse.com  Fact Sheet for Healthcare Providers:  SeriousBroker.it  This test is no t yet approved or cleared by the [place] FDA and  has been authorized for detection and/or diagnosis of SARS-CoV-2 by FDA under an Emergency Use Authorization (EUA). This EUA will remain  in effect (meaning this test can be used) for the duration of the COVID-19 declaration under Section 564(b)(1) of the Act, 21 U.S.C.section 360bbb-3(b)(1), unless the authorization is terminated  or revoked sooner.       Influenza A by PCR NEGATIVE NEGATIVE   Influenza B by PCR NEGATIVE NEGATIVE    Comment: (NOTE) The Xpert Xpress SARS-CoV-2/FLU/RSV plus assay is intended as an aid in the diagnosis of influenza from Nasopharyngeal swab specimens and should not be used as a sole basis for treatment. Nasal washings and aspirates are unacceptable for Xpert Xpress SARS-CoV-2/FLU/RSV testing.  Fact Sheet for Patients: BloggerCourse.com  Fact Sheet for Healthcare Providers: SeriousBroker.it  This test is not yet approved or cleared by the [place] FDA and has been authorized for detection and/or diagnosis of SARS-CoV-2 by FDA under an Emergency Use Authorization (EUA). This EUA will remain in effect (meaning this test can be used) for the duration of the COVID-19 declaration under Section 564(b)(1) of the Act, 21 U.S.C. section 360bbb-3(b)(1), unless the authorization is  terminated or revoked.     Resp Syncytial Virus by PCR NEGATIVE NEGATIVE    Comment: (NOTE) Fact Sheet for Patients: BloggerCourse.com  Fact Sheet for Healthcare Providers: SeriousBroker.it  This test is not yet approved or cleared by the [place] FDA and has been authorized for detection and/or diagnosis of SARS-CoV-2 by FDA under an Emergency Use Authorization (EUA). This EUA will remain in effect (meaning this test can be used) for the duration of the COVID-19 declaration under Section 564(b)(1) of the Act, 21 U.S.C. section 360bbb-3(b)(1), unless the authorization is terminated or revoked.  Performed at [facility], [address], [place], [place] [address]   POC occult blood, ED Provider will collect     Status: None   Collection Time: 12/12/22  3:54 PM  Result Value Ref Range   Fecal Occult Bld NEGATIVE NEGATIVE   DG Chest Port 1 View  Result Date: 12/12/2022 CLINICAL DATA:  Shortness of breath EXAM: PORTABLE CHEST 1 VIEW COMPARISON:  X-ray 12/12/2021 and older. FINDINGS: Calcified aorta. Enlarged cardiopericardial silhouette. Hyperinflation with some interstitial changes including some Kerley B lines consistent with some interstitial edema. Question tiny pleural effusions. No pneumothorax overlapping cardiac leads. Film is under penetrated. IMPRESSION: Enlarged heart with some interstitial edema.  Calcified aorta. Under penetrated radiograph Electronically Signed   By: Karen Kays M.D.   On: 12/12/2022 15:54    Pending Labs Unresulted Labs (From admission, onward)     Start     Ordered   12/19/22 0500  Creatinine, serum  (enoxaparin (LOVENOX)    CrCl >/= 30 ml/min)  Weekly,   R     Comments: while on enoxaparin therapy    12/12/22 1845   12/13/22 0500  APTT  Tomorrow morning,   R        12/12/22 1845   12/13/22 0500  Protime-INR  Tomorrow morning,   R        12/12/22  1845   12/13/22 0500   Basic metabolic panel  Tomorrow morning,   R        12/12/22 1845   12/13/22 0500  CBC  Tomorrow morning,   R        12/12/22 1845   12/12/22 1928  Hepatic function panel  Add-on,   AD        12/12/22 1927   12/12/22 1917  Urinalysis, Complete w Microscopic -Urine, Clean Catch  Once,   R       Question:  Specimen Source  Answer:  Urine, Clean Catch   12/12/22 1916   12/12/22 1917  Creatinine, urine, random  Once,   R        12/12/22 1916   12/12/22 1917  Sodium, urine, random  Once,   R        12/12/22 1916   12/12/22 1841  CBC  (enoxaparin (LOVENOX)    CrCl >/= 30 ml/min)  Once,   R       Comments: Baseline for enoxaparin therapy IF NOT ALREADY DRAWN.  Notify MD if PLT < 100 K.    12/12/22 1845   12/12/22 1841  Creatinine, serum  (enoxaparin (LOVENOX)    CrCl >/= 30 ml/min)  Once,   R       Comments: Baseline for enoxaparin therapy IF NOT ALREADY DRAWN.    12/12/22 1845   12/12/22 1831  Culture, blood (Routine X 2) w Reflex to ID Panel  BLOOD CULTURE X 2,   R (with STAT occurrences)      12/12/22 1830   12/12/22 1830  Haptoglobin  ONCE - URGENT,   URGENT        12/12/22 1830   12/12/22 1829  Vitamin B12  (Anemia Panel (PNL))  Once,   URGENT        12/12/22 1830   12/12/22 1829  Folate  (Anemia Panel (PNL))  Once,   URGENT        12/12/22 1830   12/12/22 1829  Iron and TIBC  (Anemia Panel (PNL))  Once,   URGENT        12/12/22 1830   12/12/22 1829  Ferritin  (Anemia Panel (PNL))  Once,   URGENT        12/12/22 1830   12/12/22 1829  Reticulocytes  (Anemia Panel (PNL))  Once,   URGENT        12/12/22 1830   12/12/22 1829  Protein electrophoresis, serum  Once,   URGENT        12/12/22 1830   12/12/22 1829  Type and screen [facility]  Once,   STAT       Comments: [facility]    12/12/22 1830   12/12/22 1829  CBC  Once,   STAT        12/12/22 1830            Vitals/Pain Today's Vitals   12/12/22 1510 12/12/22 1545 12/12/22 1605  12/12/22 1903  BP:  (!) 113/52 116/75   Pulse:  (!) 102  100  Resp:  20  (!) 23  Temp:  98.2 F (36.8 C)  99.3 F (37.4 C)  TempSrc:  Axillary  Oral  SpO2: 95% 100%  91%  Weight:    105 lb (47.6 kg)  Height:    5\' 1"  (1.549 m)    Isolation Precautions Airborne and Contact precautions  Medications Medications  cefTRIAXone (ROCEPHIN) 1 g in sodium  chloride 0.9 % 100 mL IVPB (has no administration in time range)  azithromycin (ZITHROMAX) 500 mg in sodium chloride 0.9 % 250 mL IVPB (has no administration in time range)  ipratropium-albuterol (DUONEB) 0.5-2.5 (3) MG/3ML nebulizer solution 3 mL (has no administration in time range)  enoxaparin (LOVENOX) injection 40 mg (has no administration in time range)  acetaminophen (TYLENOL) tablet 650 mg (has no administration in time range)    Or  acetaminophen (TYLENOL) suppository 650 mg (has no administration in time range)  polyethylene glycol (MIRALAX / GLYCOLAX) packet 17 g (has no administration in time range)  sodium chloride flush (NS) 0.9 % injection 3 mL (has no administration in time range)  albuterol (PROVENTIL) (2.5 MG/3ML) 0.083% nebulizer solution (10 mg/hr Nebulization Given 12/12/22 1510)  ipratropium (ATROVENT) nebulizer solution 0.5 mg (0.5 mg Nebulization Given 12/12/22 1510)  magnesium sulfate IVPB 2 g 50 mL (0 g Intravenous Stopped 12/12/22 1839)    Mobility walks

## 2022-12-12 NOTE — H&P (Signed)
History and Physical    Patient: Kelly [name] ZOX:[id number] DOB: 06-Oct-1940 DOA: 12/12/2022 DOS: the patient was seen and examined on 12/12/2022 PCP: Trisha Mangle, FNP  Patient coming from: Home  Chief Complaint:  Chief Complaint  Patient presents with   Shortness of Breath   HPI: Kelly [name] is a 82 y.o. female with medical history significant of reported COPD and CHF.  Patient does not necessarily use supplemental oxygen at home.  Although it is available to her and she reports using it as needed which is very seldom.  Patient reports that generally she is able to get up and move around, do her sheets or cook dinner without any marked sensation of shortness of breath.  However since getting up out of bed yesterday a.m., patient has been reported sensation of shortness of breath just going to the bathroom or to the kitchen.  Patient did not think it would persist and therefore tried to manage it at home.  However today she was barely able to function and her shortness of breath was so bad that she was feeling lightheaded/dizzy when she would exert herself.  Patient again reports that she slept well last evening, did not have any shortness of breath in bed.  Patient denies any chest pain any cough no sputum production no leg swelling or cramping no fever.  Patient came to the ER via EMS because of her sensation of dizziness as reported above today.  Patient was given methylprednisolone in the ambulance.  However patient reports very poor response to same in terms of shortness of breath.  Patient has further received bronchodilator therapy in the ER including DuoNebs and magnesium sulfate.  Patient was transiently put on the BiPAP in the ER.  Which patient removed on her own and is now currently on 3 liters per minute of supplementary oxygen.  Patient reports much improvement in shortness of breath but not back to baseline yet.  Medical evaluation is sought  No melena, no diahrea, no  bruising, no nose bleeidng no gum bleeding. Review of Systems: As mentioned in the history of present illness. All other systems reviewed and are negative. Past Medical History:  Diagnosis Date   Anemia    Anxiety    Arthritis    B12 deficiency 05/2015   B12 was 184   Cancer (HCC)    CHF (congestive heart failure) (HCC)    COPD (chronic obstructive pulmonary disease) (HCC)    Crohn's disease, small intestine (HCC) 02/19/2009   Depression 10/2013   chronic recurrent major depressive disorder   Dysrhythmia    H/O vitamin D deficiency 08/04/2008   Heart murmur    Hypertension    Hypothyroidism    Pneumonia    Recurrent dislocation of hip, right    Past Surgical History:  Procedure Laterality Date   BIOPSY  09/22/2021   Procedure: BIOPSY;  Surgeon: Napoleon Form, MD;  Location: [facility];  Service: Gastroenterology;;   CYSTOSCOPY WITH RETROGRADE PYELOGRAM, URETEROSCOPY AND STENT PLACEMENT Left 02/17/2021   Procedure: CYSTOSCOPY WITH  RETROGRADE PYELOGRAM, LEFT URETEROSCOPY , BIOPSY AND tumor ablation  STENT PLACEMENT;  Surgeon: Sebastian Ache, MD;  Location: [facility];  Service: Urology;  Laterality: Left;   ESOPHAGOGASTRODUODENOSCOPY (EGD) WITH PROPOFOL N/A 09/22/2021   Procedure: ESOPHAGOGASTRODUODENOSCOPY (EGD) WITH PROPOFOL;  Surgeon: Napoleon Form, MD;  Location: [facility];  Service: Gastroenterology;  Laterality: N/A;   HOLMIUM LASER APPLICATION Left 02/17/2021   Procedure: HOLMIUM LASER APPLICATION;  Surgeon: Sebastian Ache, MD;  Location: [facility];  Service: Urology;  Laterality: Left;   JOINT REPLACEMENT     Right hip, 2015   LAPAROSCOPY  06/03/2016   Duodenal ulcer repair   TONSILLECTOMY     TUBAL LIGATION     Social History:  reports that she quit smoking about 10 months ago. Her smoking use included cigarettes. She started smoking about 58 years ago. She has a 58 pack-year smoking history. She has never used smokeless tobacco. She reports current alcohol use.  She reports that she does not use drugs.  Allergies  Allergen Reactions   Bee Venom Anaphylaxis   Sulfamethoxazole-Trimethoprim Diarrhea and Nausea And Vomiting    Other Reaction(s): GI Intolerance   Trazodone Other (See Comments)    Severe weakness, unable to get up  Other Reaction(s): Fatique    Family History  Problem Relation Age of Onset   Heart disease Mother    Alcohol abuse Mother    Colon cancer Sister    Breast cancer Sister    Heart attack Brother    Cancer Neg Hx    Diabetes Neg Hx     Prior to Admission medications   Medication Sig Start Date End Date Taking? Authorizing Provider  acetaminophen (TYLENOL) 500 MG tablet Take 1,000 mg by mouth every 6 (six) hours as needed for mild pain.    [provider]  albuterol (VENTOLIN HFA) 108 (90 Base) MCG/ACT inhaler Inhale 2 puffs into the lungs every 6 (six) hours as needed for wheezing or shortness of breath. 03/18/21   Omar Person, MD  apraclonidine (IOPIDINE) 0.5 % ophthalmic solution Place 1 drop into the left eye 3 (three) times daily. 12/01/21   [provider]  carboxymethylcellulose (REFRESH PLUS) 0.5 % SOLN Place 1 drop into both eyes 3 (three) times daily as needed (dry eyes).    [provider]  diltiazem (CARDIZEM CD) 240 MG 24 hr capsule Take 1 capsule (240 mg total) by mouth daily. 05/16/22 05/11/23  Yates Decamp, MD  dorzolamide (TRUSOPT) 2 % ophthalmic solution Place 1 drop into the left eye 3 (three) times daily. 08/23/21   [provider]  DULoxetine (CYMBALTA) 60 MG capsule Take 60 mg by mouth daily. 08/19/20   [provider]  ELIQUIS 2.5 MG TABS tablet TAKE 1 TABLET(2.5 MG) BY MOUTH TWICE DAILY 11/28/22   Yates Decamp, MD  EPINEPHrine 0.3 mg/0.3 mL IJ SOAJ injection Inject 0.3 mg into the muscle as needed for anaphylaxis.    [provider]  ezetimibe (ZETIA) 10 MG tablet Take 10 mg by mouth daily.    [provider]  ferrous sulfate 325 (65 FE)  MG EC tablet Take 1 tablet (325 mg total) by mouth daily with breakfast. 08/02/16   [place], Betty G, MD  Fluticasone-Umeclidin-Vilant (TRELEGY ELLIPTA) 100-62.5-25 MCG/ACT AEPB Inhale 1 puff into the lungs daily. 03/18/21   Omar Person, MD  folic acid (FOLVITE) 1 MG tablet Take 1 mg by mouth daily.    [provider]  latanoprost (XALATAN) 0.005 % ophthalmic solution Place 1 drop into the left eye at bedtime. 08/31/21   [provider]  metoprolol succinate (TOPROL-XL) 25 MG 24 hr tablet Take 1 tablet (25 mg total) by mouth daily. 09/24/21   Marolyn Haller, MD  nicotine (NICODERM CQ - DOSED IN MG/24 HOURS) 21 mg/24hr patch Place 1 patch (21 mg total) onto the skin daily. Patient taking differently: Place 14 mg onto the skin daily. 06/04/20   Yates Decamp, MD  OXYGEN Inhale 1 L into the lungs daily as needed (shortness of breath).    [provider]  pregabalin (LYRICA) 75 MG capsule Take 75 mg by mouth 3 (three) times daily.    [provider]  rosuvastatin (CRESTOR) 40 MG tablet Take 40 mg by mouth daily. 07/29/21   [provider]  Spacer/Aero-Holding Chambers (VORTEX VALVED HOLDING CHAMBER) DEVI by Does not apply route.    [provider]    Physical Exam: Vitals:   12/12/22 1510 12/12/22 1545 12/12/22 1605 12/12/22 1903  BP:  (!) 113/52 116/75   Pulse:  (!) 102  100  Resp:  20  (!) 23  Temp:  98.2 F (36.8 C)  99.3 F (37.4 C)  TempSrc:  Axillary  Oral  SpO2: 95% 100%  91%  Weight:    47.6 kg  Height:    5\' 1"  (1.549 m)   General: Patient is alert and awake gives a coherent account of her symptoms patient's tachypnea is appreciated going up to 30/min during simple exam maneuvers such as sitting up, laying down etc.  Otherwise patient appears to be in no distress. Respiratory exam: Bilateral basilar crackles posteriorly as well as in the interscapular area, as well as in the left supraclavicular area posteriorly.  Anteriorly  crackles are heard in the mid lung field on the right side. Cardiovascular exam S1-S2 soft, loud systolic murmur is heard.  This actually makes even respiratory exam difficult Abdomen soft nontender Extremities warm without edema no focal motor deficit. Data Reviewed:  Labs on Admission:  Results for orders placed or performed during the hospital encounter of 12/12/22 (from the past 24 hour(s))  Basic metabolic panel     Status: Abnormal   Collection Time: 12/12/22  2:53 PM  Result Value Ref Range   Sodium 136 135 - 145 mmol/L   Potassium 3.8 3.5 - 5.1 mmol/L   Chloride 105 98 - 111 mmol/L   CO2 20 (L) 22 - 32 mmol/L   Glucose, Bld 225 (H) 70 - 99 mg/dL   BUN 25 (H) 8 - 23 mg/dL   Creatinine, Ser 4.09 (H) 0.44 - 1.00 mg/dL   Calcium 8.9 8.9 - 81.1 mg/dL   GFR, Estimated 38 (L) >60 mL/min   Anion gap 11 5 - 15  CBC with Differential/Platelet     Status: Abnormal   Collection Time: 12/12/22  2:53 PM  Result Value Ref Range   WBC 13.3 (H) 4.0 - 10.5 K/uL   RBC 2.43 (L) 3.87 - 5.11 MIL/uL   Hemoglobin 7.7 (L) 12.0 - 15.0 g/dL   HCT 91.4 (L) 78.2 - 95.6 %   MCV 103.7 (H) 80.0 - 100.0 fL   MCH 31.7 26.0 - 34.0 pg   MCHC 30.6 30.0 - 36.0 g/dL   RDW 21.3 (H) 08.6 - 57.8 %   Platelets 280 150 - 400 K/uL   nRBC 0.0 0.0 - 0.2 %   Neutrophils Relative % 90 %   Neutro Abs 12.0 (H) 1.7 - 7.7 K/uL   Lymphocytes Relative 5 %   Lymphs Abs 0.7 0.7 - 4.0 K/uL   Monocytes Relative 4 %   Monocytes Absolute 0.5 0.1 - 1.0 K/uL   Eosinophils Relative 0 %   Eosinophils Absolute 0.0 0.0 - 0.5 K/uL   Basophils Relative 0 %   Basophils Absolute 0.0 0.0 - 0.1 K/uL   Immature Granulocytes 1 %   Abs Immature Granulocytes 0.07 0.00 - 0.07 K/uL  Brain natriuretic  peptide     Status: Abnormal   Collection Time: 12/12/22  2:53 PM  Result Value Ref Range   B Natriuretic Peptide 1,067.4 (H) 0.0 - 100.0 pg/mL  Blood gas, venous     Status: Abnormal   Collection Time: 12/12/22  2:53 PM  Result Value Ref  Range   pH, Ven 7.28 7.25 - 7.43   pCO2, Ven 43 (L) 44 - 60 mmHg   pO2, Ven 44 32 - 45 mmHg   Bicarbonate 20.2 20.0 - 28.0 mmol/L   Acid-base deficit 6.4 (H) 0.0 - 2.0 mmol/L   O2 Saturation 71.1 %   Patient temperature 37.0   Resp panel by RT-PCR (RSV, Flu A&B, Covid) Anterior Nasal Swab     Status: None   Collection Time: 12/12/22  2:58 PM   Specimen: Anterior Nasal Swab  Result Value Ref Range   SARS Coronavirus 2 by RT PCR NEGATIVE NEGATIVE   Influenza A by PCR NEGATIVE NEGATIVE   Influenza B by PCR NEGATIVE NEGATIVE   Resp Syncytial Virus by PCR NEGATIVE NEGATIVE  POC occult blood, ED Provider will collect     Status: None   Collection Time: 12/12/22  3:54 PM  Result Value Ref Range   Fecal Occult Bld NEGATIVE NEGATIVE   Basic Metabolic Panel: Recent Labs  Lab 12/12/22 1453  NA 136  K 3.8  CL 105  CO2 20*  GLUCOSE 225*  BUN 25*  CREATININE 1.39*  CALCIUM 8.9   Liver Function Tests: No results for input(s): "AST", "ALT", "ALKPHOS", "BILITOT", "PROT", "ALBUMIN" in the last 168 hours. No results for input(s): "LIPASE", "AMYLASE" in the last 168 hours. No results for input(s): "AMMONIA" in the last 168 hours. CBC: Recent Labs  Lab 12/12/22 1453  WBC 13.3*  NEUTROABS 12.0*  HGB 7.7*  HCT 25.2*  MCV 103.7*  PLT 280   Cardiac Enzymes: No results for input(s): "CKTOTAL", "CKMB", "CKMBINDEX", "TROPONINIHS" in the last 168 hours.  BNP (last 3 results) No results for input(s): "PROBNP" in the last 8760 hours. CBG: No results for input(s): "GLUCAP" in the last 168 hours.  Radiological Exams on Admission:  DG Chest Port 1 View  Result Date: 12/12/2022 CLINICAL DATA:  Shortness of breath EXAM: PORTABLE CHEST 1 VIEW COMPARISON:  X-ray 12/12/2021 and older. FINDINGS: Calcified aorta. Enlarged cardiopericardial silhouette. Hyperinflation with some interstitial changes including some Kerley B lines consistent with some interstitial edema. Question tiny pleural  effusions. No pneumothorax overlapping cardiac leads. Film is under penetrated. IMPRESSION: Enlarged heart with some interstitial edema.  Calcified aorta. Under penetrated radiograph Electronically Signed   By: Karen Kays M.D.   On: 12/12/2022 15:54    EKG: Independently reviewed. Fluctuating upsloaping ST elevation in V1 (1mm) and V3 (2mm) . Downsloaping ST depression in V5/6, Lead II/III. LVH.   These changes are more pronounced, but not entirely new since 05/16/2022   Assessment and Plan: CAP (community acquired pneumonia) See HPI for presentation.  Basically patient presenting with 2 days of progressive shortness of breath necessitating transient BiPAP therapy in the ER now stabilized on 3 L/min of supplementary oxygen.  With chest x-ray finding of interstitial opacification and bibasilar haziness noted by author.  On exam crackles are heard rather diffusely.  Without any expiratory wheezes appreciated.  Patient is s/p methylprednisolone in the EMS.  As well as DuoNeb therapy in the ER.  At this time given association with leukocytosis, patient has to be covered for community-acquired pneumonia.  Check viral panel.  Check blood culture, treat with ceftriaxone and azithromycin.  Differentials include patient having left heart failure induced pulmonary edema +/- pleural effusion given her history of marked left ventricular hypertrophy as well as left atrial enlargement.  Unfortunately patient does not have much of her diastolic blood pressure for me to reduce her afterload at this time.  Further is already having AKI. Chek trop. Check CT chest to dilineate what we are dealing with better.  AKI (acute kidney injury) (HCC) Baseline patient creatinine seems to be from 1.06-1.25.  12 months ago it was actually within normal limits.  Today creatinine noted to be 1.39.  I will check urinalysis sodium and creatinine.  Monitor intake and output.  Patient does not report any urinary symptoms.  I suspect  patient's creatinine elevation may be due to hypoxemia.  Regardless I hesitate to give patient fluids at this time because of her pulmonary crackles and risk of deteriorating her pulmonary status.  We will monitor patient's trend of creatinine with oxygen supplementation .  Paroxysmal atrial fibrillation (HCC) This is chrnic diagnoss documentedin chart. Noted on EKG today. C.w. eliquis  Severe concentric left ventricular hypertrophy Based on Echo in 07/14/2021: LVOT gradient at least 89 mmHg.  Severe concentric hypertrophy of  the left ventricle. Normal global wall motion. Normal LV systolic function  with EF 57%. Diastolic function not assessed due to severity of mitral  regurgitation.  Left atrial cavity is severely dilated.  Patinet may be having decompensation of her LVH. Will check troponin. Repeat echo at this time. Consider cardio input basedon echo finding.s  Anemia, iron deficiency This is a chronic diagnosis, patient is noted to have pallor and a hemoglobin of 7.7 at this time.  I will repeat this, if confirmed, I would report that the patient is symptomatic based on her current presentation.  And patient will be transfused.  Patient is agreeable to the plan and consented. Anemia wor k up ordered. Including haptoglocbin and spep  Hemocult done by ER provider reported negatie to me verballyl    Home med rec pending pharmacy input.    Advance Care Planning:   Code Status: Full Code   Consults: none at this time.   Family Communication: per patient.  Severity of Illness: The appropriate patient status for this patient is INPATIENT. Inpatient status is judged to be reasonable and necessary in order to provide the required intensity of service to ensure the patient's safety. The patient's presenting symptoms, physical exam findings, and initial radiographic and laboratory data in the context of their chronic comorbidities is felt to place them at high risk for further clinical  deterioration. Furthermore, it is not anticipated that the patient will be medically stable for discharge from the hospital within 2 midnights of admission.   * I certify that at the point of admission it is my clinical judgment that the patient will require inpatient hospital care spanning beyond 2 midnights from the point of admission due to high intensity of service, high risk for further deterioration and high frequency of surveillance required.*  Author: Nolberto Hanlon, MD 12/12/2022 7:11 PM  For on call review [email or web address].

## 2022-12-12 NOTE — Assessment & Plan Note (Signed)
Based on Echo in 07/14/2021: LVOT gradient at least 89 mmHg.  Severe concentric hypertrophy of  the left ventricle. Normal global wall motion. Normal LV systolic function  with EF 57%. Diastolic function not assessed due to severity of mitral  regurgitation.  Left atrial cavity is severely dilated.  Patinet may be having decompensation of her LVH. Will check troponin. Repeat echo at this time. Consider cardio input basedon echo finding.s

## 2022-12-12 NOTE — ED Provider Notes (Signed)
[place] [facility]ERGENCY DEPARTMENT AT [facility] Provider Note   CSN: [id number] Arrival date & time: 12/12/22  1355     History  Chief Complaint  Patient presents with   Shortness of Breath    Kelly [name] is a 82 y.o. female with history of COPD on home oxygen as needed, A-fib on Eliquis, hypothyroidism, HFpEF, presents with concern for gradual onset shortness of breath for the past couple days, but worsened this morning.  Reports it is harder to talk been normal.  Denies any chest pain abdominal pain.  She has tried her home oxygen and inhalers without relief.  Denies any lower extremity swelling.  Denies any fever, chills, cough.   Shortness of Breath      Home Medications Prior to Admission medications   Medication Sig Start Date End Date Taking? Authorizing Provider  acetaminophen (TYLENOL) 500 MG tablet Take 1,000 mg by mouth every 6 (six) hours as needed for mild pain.    [provider]  albuterol (VENTOLIN HFA) 108 (90 Base) MCG/ACT inhaler Inhale 2 puffs into the lungs every 6 (six) hours as needed for wheezing or shortness of breath. 03/18/21   Omar Person, MD  apraclonidine (IOPIDINE) 0.5 % ophthalmic solution Place 1 drop into the left eye 3 (three) times daily. 12/01/21   [provider]  carboxymethylcellulose (REFRESH PLUS) 0.5 % SOLN Place 1 drop into both eyes 3 (three) times daily as needed (dry eyes).    [provider]  diltiazem (CARDIZEM CD) 240 MG 24 hr capsule Take 1 capsule (240 mg total) by mouth daily. 05/16/22 05/11/23  Yates Decamp, MD  dorzolamide (TRUSOPT) 2 % ophthalmic solution Place 1 drop into the left eye 3 (three) times daily. 08/23/21   [provider]  DULoxetine (CYMBALTA) 60 MG capsule Take 60 mg by mouth daily. 08/19/20   [provider]  ELIQUIS 2.5 MG TABS tablet TAKE 1 TABLET(2.5 MG) BY MOUTH TWICE DAILY 11/28/22   Yates Decamp, MD  EPINEPHrine 0.3 mg/0.3 mL IJ SOAJ injection Inject  0.3 mg into the muscle as needed for anaphylaxis.    [provider]  ezetimibe (ZETIA) 10 MG tablet Take 10 mg by mouth daily.    [provider]  ferrous sulfate 325 (65 FE) MG EC tablet Take 1 tablet (325 mg total) by mouth daily with breakfast. 08/02/16   [place], Betty G, MD  Fluticasone-Umeclidin-Vilant (TRELEGY ELLIPTA) 100-62.5-25 MCG/ACT AEPB Inhale 1 puff into the lungs daily. 03/18/21   Omar Person, MD  folic acid (FOLVITE) 1 MG tablet Take 1 mg by mouth daily.    [provider]  latanoprost (XALATAN) 0.005 % ophthalmic solution Place 1 drop into the left eye at bedtime. 08/31/21   [provider]  metoprolol succinate (TOPROL-XL) 25 MG 24 hr tablet Take 1 tablet (25 mg total) by mouth daily. 09/24/21   Marolyn Haller, MD  nicotine (NICODERM CQ - DOSED IN MG/24 HOURS) 21 mg/24hr patch Place 1 patch (21 mg total) onto the skin daily. Patient taking differently: Place 14 mg onto the skin daily. 06/04/20   Yates Decamp, MD  OXYGEN Inhale 1 L into the lungs daily as needed (shortness of breath).    [provider]  pregabalin (LYRICA) 75 MG capsule Take 75 mg by mouth 3 (three) times daily.    [provider]  rosuvastatin (CRESTOR) 40 MG tablet Take 40 mg by mouth daily. 07/29/21   [provider]  Spacer/Aero-Holding Deretha Emory (VORTEX  VALVED HOLDING CHAMBER) DEVI by Does not apply route.    [provider]      Allergies    Bactrim [sulfamethoxazole-trimethoprim], Bee venom, and Trazodone    Review of Systems   Review of Systems  Respiratory:  Positive for shortness of breath.     Physical Exam Updated Vital Signs BP 116/75   Pulse (!) 102   Temp 98.2 F (36.8 C) (Axillary)   Resp 20   SpO2 100%  Physical Exam Vitals and nursing note reviewed.  Constitutional:      General: She is not in acute distress.    Appearance: She is well-developed.  HENT:     Head: Normocephalic and atraumatic.  Eyes:      Conjunctiva/sclera: Conjunctivae normal.  Cardiovascular:     Rate and Rhythm: Normal rate and regular rhythm.     Heart sounds: Murmur heard.  Pulmonary:     Effort: Pulmonary effort is normal. No respiratory distress.     Breath sounds: Normal breath sounds.     Comments: Diffuse wheezing in all lung fields.  Increased work of breathing, talking in short sentences. Abdominal:     Palpations: Abdomen is soft.     Tenderness: There is no abdominal tenderness.  Musculoskeletal:        General: No swelling.     Cervical back: Neck supple.     Comments: No lower extremity edema  Skin:    General: Skin is warm and dry.     Capillary Refill: Capillary refill takes less than 2 seconds.  Neurological:     Mental Status: She is alert.  Psychiatric:        Mood and Affect: Mood normal.     ED Results / Procedures / Treatments   Labs (all labs ordered are listed, but only abnormal results are displayed) Labs Reviewed  BASIC METABOLIC PANEL - Abnormal; Notable for the following components:      Result Value   CO2 20 (*)    Glucose, Bld 225 (*)    BUN 25 (*)    Creatinine, Ser 1.39 (*)    GFR, Estimated 38 (*)    All other components within normal limits  CBC WITH DIFFERENTIAL/PLATELET - Abnormal; Notable for the following components:   WBC 13.3 (*)    RBC 2.43 (*)    Hemoglobin 7.7 (*)    HCT 25.2 (*)    MCV 103.7 (*)    RDW 16.0 (*)    Neutro Abs 12.0 (*)    All other components within normal limits  BRAIN NATRIURETIC PEPTIDE - Abnormal; Notable for the following components:   B Natriuretic Peptide 1,067.4 (*)    All other components within normal limits  BLOOD GAS, VENOUS - Abnormal; Notable for the following components:   pCO2, Ven 43 (*)    Acid-base deficit 6.4 (*)    All other components within normal limits  RESP PANEL BY RT-PCR (RSV, FLU A&B, COVID)  RVPGX2  POC OCCULT BLOOD, ED    EKG EKG Interpretation Date/Time:  Monday December 12 2022 14:29:51  EST Ventricular Rate:  101 PR Interval:  144 QRS Duration:  89 QT Interval:  354 QTC Calculation: 455 R Axis:   62  Text Interpretation: Atrial fibrillation vs sinus arrhythmia LVH with secondary repolarization abnormality TWI is not new Confirmed by Derwood Kaplan ([id number]) on 12/12/2022 2:38:01 PM  Radiology DG Chest Port 1 View  Result Date: 12/12/2022 CLINICAL DATA:  Shortness of breath EXAM: PORTABLE CHEST  1 VIEW COMPARISON:  X-ray 12/12/2021 and older. FINDINGS: Calcified aorta. Enlarged cardiopericardial silhouette. Hyperinflation with some interstitial changes including some Kerley B lines consistent with some interstitial edema. Question tiny pleural effusions. No pneumothorax overlapping cardiac leads. Film is under penetrated. IMPRESSION: Enlarged heart with some interstitial edema.  Calcified aorta. Under penetrated radiograph Electronically Signed   By: Karen Kays M.D.   On: 12/12/2022 15:54    Procedures .Critical Care  Performed by: Arabella Merles, PA-C Authorized by: Arabella Merles, PA-C   Critical care provider statement:    Critical care time (minutes):  35   Critical care was necessary to treat or prevent imminent or life-threatening deterioration of the following conditions: Dyspnea.   Critical care was time spent personally by me on the following activities:  Discussions with consultants, development of treatment plan with patient or surrogate, evaluation of patient's response to treatment, obtaining history from patient or surrogate, review of old charts, re-evaluation of patient's condition, ordering and review of radiographic studies, ordering and review of laboratory studies and ordering and performing treatments and interventions   Care discussed with: admitting provider       Medications Ordered in ED Medications  magnesium sulfate IVPB 2 g 50 mL (2 g Intravenous New Bag/Given 12/12/22 1715)  albuterol (PROVENTIL) (2.5 MG/3ML) 0.083% nebulizer  solution (10 mg/hr Nebulization Given 12/12/22 1510)  ipratropium (ATROVENT) nebulizer solution 0.5 mg (0.5 mg Nebulization Given 12/12/22 1510)    ED Course/ Medical Decision Making/ A&P                                 Medical Decision Making Amount and/or Complexity of Data Reviewed Labs: ordered. Radiology: ordered.  Risk Prescription drug management.     Differential diagnosis includes but is not limited to COPD exacerbation, CHF, COVID, flu, pneumonia, ACS, pleural effusion  ED Course:  Patient presents with noticeable shortness of breath.  She was talking in short sentences upon arrival, increased respiratory rate.  She was starting 95% on room air initially.  She was started on BiPAP.  Afebrile, but tachycardic to 102 upon arrival.  Upon recheck after about 1 hour of BiPAP and albuterol and ipratropium treatment, she remains tachycardic and short of breath.  Her flu, COVID, RSV are negative.  She denied any sick symptoms, low suspicion for any viral URI that could be contributing to shortness of breath at this time.  She did have a elevated WBC at 13.3.  Hemoglobin low at 7.7, appears her baseline is in the nines.  She denies any bloody or melanotic stools.  Hemoccult negative.  Suspect this could be contributing to her shortness of breath, will need to have this value trended inpatient.  Creatinine elevated at 1.39, no recent baseline. Patient's BNP is elevated at 1067, however, this appears to be at baseline.  She does not have any lower extremity edema, low suspicion for fluid overload at this time.  Chest x-ray does show enlarged cardiac silhouette, some interstitial changes and possible pleural effusion.  This been gradual in onset, patient denies any abdominal pain, chest pain.  EKG with A-fib versus sinus arrhythmia.  Low concern for ACS at this time.  I discussed the case with Dr. Charmayne Sheer, will plan for admission for further treatment of her dyspnea and monitoring of  hemoglobin.  Impression: Dyspnea Low hemoglobin  Disposition:  Admission with Dr. Charmayne Sheer  Lab Tests: I Ordered, and personally interpreted labs.  The pertinent results include:   CBC with leukocytosis of 13.3, hemoglobin 7.7 BMP with elevated creatinine at 1.39 BNP 1067 VBG with acid-base deficit 6.4 COVID, flu, RSV negative Hemoccult negative  Imaging Studies ordered: I ordered imaging studies including chest x-ray I independently visualized the imaging with scope of interpretation limited to determining acute life threatening conditions related to emergency care. Imaging showed enlarged cardiac silhouette, interstitial edema I agree with the radiologist interpretation   Cardiac Monitoring: / EKG: The patient was maintained on a cardiac monitor.  I personally viewed and interpreted the cardiac monitored which showed an underlying rhythm of: afib vs sinus arrhythmia   Consultations Obtained: I requested consultation with the hospitalist Dr. Samara Deist,  and discussed lab and imaging findings as well as pertinent plan - they recommend: admission for further treatment and monitoring              Final Clinical Impression(s) / ED Diagnoses Final diagnoses:  SOB (shortness of breath)  Low hemoglobin    Rx / DC Orders ED Discharge Orders     None         Arabella Merles, Cordelia Poche 12/12/22 1755    Lorre Nick, MD 12/14/22 0900

## 2022-12-12 NOTE — Assessment & Plan Note (Signed)
This is chrnic diagnoss documentedin chart. Noted on EKG today. C.w. eliquis

## 2022-12-12 NOTE — ED Notes (Signed)
Provided patient with patient socks for comfort. Patient resting in bed on cell phone at this time

## 2022-12-12 NOTE — ED Triage Notes (Signed)
Pt BIB EMS from home, c/o SOB, increasing work of breathing with exertion, and expiratory wheezing. 85% on 2 liters, 10mg  total nebulizers, 125 mg solumedrol. 6 liters noted with sats 95%  BP 120/68 P 80 RR 24-30 labored SpO2 95% 20 LFA

## 2022-12-12 NOTE — ED Notes (Signed)
Nurse messaged back to ok for patient to come upstairs at this time.

## 2022-12-12 NOTE — Progress Notes (Signed)
CAT stopped.

## 2022-12-12 NOTE — Assessment & Plan Note (Signed)
Baseline patient creatinine seems to be from 1.06-1.25.  12 months ago it was actually within normal limits.  Today creatinine noted to be 1.39.  I will check urinalysis sodium and creatinine.  Monitor intake and output.  Patient does not report any urinary symptoms.  I suspect patient's creatinine elevation may be due to hypoxemia.  Regardless I hesitate to give patient fluids at this time because of her pulmonary crackles and risk of deteriorating her pulmonary status.  We will monitor patient's trend of creatinine with oxygen supplementation .

## 2022-12-12 NOTE — Assessment & Plan Note (Signed)
See HPI for presentation.  Basically patient presenting with 2 days of progressive shortness of breath necessitating transient BiPAP therapy in the ER now stabilized on 3 L/min of supplementary oxygen.  With chest x-ray finding of interstitial opacification and bibasilar haziness noted by author.  On exam crackles are heard rather diffusely.  Without any expiratory wheezes appreciated.  Patient is s/p methylprednisolone in the EMS.  As well as DuoNeb therapy in the ER.  At this time given association with leukocytosis, patient has to be covered for community-acquired pneumonia.  Check viral panel.  Check blood culture, treat with ceftriaxone and azithromycin.  Differentials include patient having left heart failure induced pulmonary edema +/- pleural effusion given her history of marked left ventricular hypertrophy as well as left atrial enlargement.  Unfortunately patient does not have much of her diastolic blood pressure for me to reduce her afterload at this time.  Further is already having AKI. Chek trop. Check CT chest to dilineate what we are dealing with better.

## 2022-12-12 NOTE — ED Notes (Signed)
Patient resting in bed.

## 2022-12-12 NOTE — Progress Notes (Signed)
Received call from Paramedic at approximately 0608 stating PT has been removed from BiPAP and placed on nasal cannula and that PT does not appear to be in respiratory distress at this time.

## 2022-12-12 NOTE — ED Notes (Signed)
Patient provided with a sandwhich

## 2022-12-12 NOTE — ED Notes (Signed)
Messaged floor nurse to see if patient is able to come up. Have not heard anything since report was in at 1932. Nurse okd for patient to come up

## 2022-12-12 NOTE — Assessment & Plan Note (Addendum)
This is a chronic diagnosis, patient is noted to have pallor and a hemoglobin of 7.7 at this time.  I will repeat this, if confirmed, I would report that the patient is symptomatic based on her current presentation.  And patient will be transfused.  Patient is agreeable to the plan and consented. Anemia wor k up ordered. Including haptoglocbin and spep  Hemocult done by ER provider reported negatie to me verballyl

## 2022-12-12 NOTE — ED Notes (Signed)
Nurse messaged floor nurse to see if patient can come up. No communication after report was put in at 1932.

## 2022-12-13 ENCOUNTER — Other Ambulatory Visit: Payer: Self-pay

## 2022-12-13 ENCOUNTER — Inpatient Hospital Stay (HOSPITAL_COMMUNITY): Payer: Medicare Other

## 2022-12-13 DIAGNOSIS — R0902 Hypoxemia: Secondary | ICD-10-CM | POA: Diagnosis not present

## 2022-12-13 DIAGNOSIS — I5031 Acute diastolic (congestive) heart failure: Secondary | ICD-10-CM

## 2022-12-13 LAB — BLOOD CULTURE ID PANEL (REFLEXED) - BCID2

## 2022-12-13 LAB — CBC
HCT: 27.7 % — ABNORMAL LOW (ref 36.0–46.0)
Hemoglobin: 8.4 g/dL — ABNORMAL LOW (ref 12.0–15.0)
MCH: 31.1 pg (ref 26.0–34.0)
MCHC: 30.3 g/dL (ref 30.0–36.0)
MCV: 102.6 fL — ABNORMAL HIGH (ref 80.0–100.0)
Platelets: 215 10*3/uL (ref 150–400)
RBC: 2.7 MIL/uL — ABNORMAL LOW (ref 3.87–5.11)
RDW: 16.8 % — ABNORMAL HIGH (ref 11.5–15.5)
WBC: 7.1 10*3/uL (ref 4.0–10.5)
nRBC: 0.7 % — ABNORMAL HIGH (ref 0.0–0.2)

## 2022-12-13 LAB — ECHOCARDIOGRAM COMPLETE
Area-P 1/2: 3.54 cm2
Height: 62 in
S' Lateral: 2 cm
Weight: 1686.08 [oz_av]

## 2022-12-13 LAB — PREPARE RBC (CROSSMATCH)

## 2022-12-13 LAB — RESPIRATORY PANEL BY PCR

## 2022-12-13 LAB — HEPATIC FUNCTION PANEL
ALT: 17 U/L (ref 0–44)
AST: 25 U/L (ref 15–41)
Albumin: 3.6 g/dL (ref 3.5–5.0)
Alkaline Phosphatase: 51 U/L (ref 38–126)
Bilirubin, Direct: <0.1 mg/dL (ref 0.0–0.2)
Total Bilirubin: 0.3 mg/dL (ref <1.2)
Total Protein: 6.5 g/dL (ref 6.5–8.1)

## 2022-12-13 LAB — CREATININE, SERUM
Creatinine, Ser: 1.4 mg/dL — ABNORMAL HIGH (ref 0.44–1.00)
GFR, Estimated: 38 mL/min — ABNORMAL LOW (ref >60)

## 2022-12-13 LAB — BASIC METABOLIC PANEL
Anion gap: 9 (ref 5–15)
BUN: 29 mg/dL — ABNORMAL HIGH (ref 8–23)
CO2: 20 mmol/L — ABNORMAL LOW (ref 22–32)
Calcium: 8.5 mg/dL — ABNORMAL LOW (ref 8.9–10.3)
Chloride: 110 mmol/L (ref 98–111)
Creatinine, Ser: 1.09 mg/dL — ABNORMAL HIGH (ref 0.44–1.00)
GFR, Estimated: 51 mL/min — ABNORMAL LOW (ref >60)
Glucose, Bld: 174 mg/dL — ABNORMAL HIGH (ref 70–99)
Potassium: 4.2 mmol/L (ref 3.5–5.1)
Sodium: 139 mmol/L (ref 135–145)

## 2022-12-13 LAB — PROTIME-INR
INR: 1.3 — ABNORMAL HIGH (ref 0.8–1.2)
Prothrombin Time: 16.4 s — ABNORMAL HIGH (ref 11.4–15.2)

## 2022-12-13 LAB — APTT: aPTT: 38 s — ABNORMAL HIGH (ref 24–36)

## 2022-12-13 LAB — ABO/RH: ABO/RH(D): O POS

## 2022-12-13 LAB — TROPONIN I (HIGH SENSITIVITY)
Troponin I (High Sensitivity): 32 ng/L — ABNORMAL HIGH (ref <18)
Troponin I (High Sensitivity): 28 ng/L — ABNORMAL HIGH (ref <18)

## 2022-12-13 MED ORDER — PREGABALIN 75 MG PO CAPS
75.0000 mg | ORAL_CAPSULE | Freq: Three times a day (TID) | ORAL | Status: DC
  Administered 2022-12-13 – 2022-12-19 (×19): 75 mg via ORAL
  Filled 2022-12-13 (×19): qty 1

## 2022-12-13 MED ORDER — IPRATROPIUM-ALBUTEROL 0.5-2.5 (3) MG/3ML IN SOLN: 3.0000 mL | RESPIRATORY_TRACT | Status: DC | PRN

## 2022-12-13 MED ORDER — FERROUS SULFATE 325 (65 FE) MG PO TABS
325.0000 mg | ORAL_TABLET | Freq: Every day | ORAL | Status: DC
  Administered 2022-12-14 – 2022-12-19 (×6): 325 mg via ORAL
  Filled 2022-12-13 (×6): qty 1

## 2022-12-13 MED ORDER — NICOTINE 14 MG/24HR TD PT24
14.0000 mg | MEDICATED_PATCH | Freq: Every day | TRANSDERMAL | Status: DC
  Administered 2022-12-13 – 2022-12-19 (×7): 14 mg via TRANSDERMAL
  Filled 2022-12-13 (×7): qty 1

## 2022-12-13 MED ORDER — DULOXETINE HCL 60 MG PO CPEP
60.0000 mg | ORAL_CAPSULE | Freq: Every day | ORAL | Status: DC
  Administered 2022-12-13 – 2022-12-19 (×7): 60 mg via ORAL
  Filled 2022-12-13 (×7): qty 1

## 2022-12-13 MED ORDER — MIRTAZAPINE 15 MG PO TABS
7.5000 mg | ORAL_TABLET | Freq: Every day | ORAL | Status: DC
  Administered 2022-12-13 – 2022-12-18 (×6): 7.5 mg via ORAL
  Filled 2022-12-13 (×6): qty 1

## 2022-12-13 NOTE — Hospital Course (Addendum)
Kelly [name] is a 82 y.o. female with a history of COPD and heart failure.  Patient presented secondary to shortness of breath with evidence of pneumonia with associated respiratory failure on admission. Presentation also concerning for possible heart failure secondary to interstitial edema seen on imaging. Patient managed with antibiotics empirically, in addition to oxygen supplementation.

## 2022-12-13 NOTE — Progress Notes (Signed)
   12/13/22 1945  BiPAP/CPAP/SIPAP  BiPAP/CPAP/SIPAP Pt Type Adult  Reason BIPAP/CPAP not in use Other(comment) (Bipap not indicated at this time.)  BiPAP/CPAP /SiPAP Vitals  SpO2 99 %  Bilateral Breath Sounds Diminished

## 2022-12-13 NOTE — Progress Notes (Signed)
PROGRESS NOTE    Kelly [name]  ZOX:[id number] DOB: 11-Aug-1940 DOA: 12/12/2022 PCP: Trisha Mangle, FNP   Brief Narrative: Kelly [name] is a 82 y.o. female with a history of COPD and heart failure.  Patient presented secondary to shortness of breath with evidence of pneumonia with associated respiratory failure on admission. Presentation also concerning for possible heart failure secondary to interstitial edema seen on imaging. Patient managed with antibiotics empirically, in addition to oxygen supplementation.   Assessment and Plan:  Community acquired pneumonia CT chest imaging significant for right lower lobe airspace disease concerning for pneumonia, in addition to multifocal ground-glass and nodular ground-glass opacities throughout both lungs. Associated leukocytosis and respiratory distress. Patient started empirically on Ceftriaxone and Azithromycin. Blood cultures obtained and are pending. COVID-19, influenza and RSV negative. -Continue Ceftriaxone and azithromycin -Check RVP  Interstitial pulmonary edema Noted on imaging. Concern for possible acute on chronic diastolic heart failure. BNP elevated at 1,067. Transthoracic Echocardiogram ordered is pending. Lasix ordered but not given. Respiratory symptoms improved. -Follow-up Transthoracic Echocardiogram  Acute respiratory failure with hypoxia Possible multifactorial in setting of pneumonia and pulmonary edema and complicated by COPD. Patient requiring BiPAP initially and transitioned to nasal canula. Weaned to 1 L/min. -Wean to room air as able -Ambulatory pulse ox prior to discharge  AKI Baseline creatinine of 1. Creatinine of 1.39 on  admission with peak to 1.4. Resolved.  COPD Patient is on Spiriva, albuterol and Trelegy as an outpatient. Stable. -Continue Breo Ellipta, Incruse Ellipta and albuterol scheduled -Add DuoNeb PRN  Paroxysmal atrial fibrillation Patient is on diltiazem and metoprolol for rate  control, in addition to Eliquis for stroke risk reduction. -Continue metoprolol succinate, diltiazem and Eliquis  Hypertrophic obstructive cardiomyopathy Noted in history.  Iron deficiency anemia Baseline hemoglobin between 8-10. Chronic and stable at this time.  Hyperlipidemia -Continue Crestor and Zetia  Depression -Continue Cymbalta  Ground-glass opacities Noted on CT imaging. Possible infectious versus inflammatory, but neoplasm is not excluded. Recommendation for follow-up CT chest in 3 months for reevaluation.   DVT prophylaxis: Eliquis Code Status:   Code Status: Full Code Family Communication: None at bedside Disposition Plan: Discharge home likely in 24 hours if continues to improve, and pending culture data   Consultants:  None  Procedures:  Transthoracic Echocardiogram  Antimicrobials: Ceftriaxone Azithromycin    Subjective: Patient reports breathing much better than on admission. Some confusion overnight per patient. No other concerns. Feels better today.  Objective: BP 118/68 (BP Location: Right Arm)   Pulse 98   Temp 97.6 F (36.4 C) (Oral)   Resp 18   Ht 5\' 2"  (1.575 m)   Wt 47.8 kg   SpO2 97%   BMI 19.27 kg/m   Examination:  General exam: Appears calm and comfortable Respiratory system: Respiratory effort normal. Cardiovascular system: S1 & S2 heard, RRR. 3/6 systolic murmur. Gastrointestinal system: Abdomen is nondistended, soft and nontender.  Normal bowel sounds heard. Central nervous system: Alert and oriented. No focal neurological deficits. Musculoskeletal: No edema. No calf tenderness Psychiatry: Judgement and insight appear normal. Mood & affect appropriate.    Data Reviewed: I have personally reviewed following labs and imaging studies  CBC Lab Results  Component Value Date   WBC 7.1 12/13/2022   RBC 2.70 (L) 12/13/2022   HGB 8.4 (L) 12/13/2022   HCT 27.7 (L) 12/13/2022   MCV 102.6 (H) 12/13/2022   MCH 31.1 12/13/2022    PLT 215 12/13/2022   MCHC 30.3 12/13/2022   RDW 16.8 (  H) 12/13/2022   LYMPHSABS 0.7 12/12/2022   MONOABS 0.5 12/12/2022   EOSABS 0.0 12/12/2022   BASOSABS 0.0 12/12/2022     Last metabolic panel Lab Results  Component Value Date   NA 139 12/13/2022   K 4.2 12/13/2022   CL 110 12/13/2022   CO2 20 (L) 12/13/2022   BUN 29 (H) 12/13/2022   CREATININE 1.09 (H) 12/13/2022   GLUCOSE 174 (H) 12/13/2022   GFRNONAA 51 (L) 12/13/2022   CALCIUM 8.5 (L) 12/13/2022   PHOS 3.4 09/13/2020   PROT 6.5 12/12/2022   ALBUMIN 3.6 12/12/2022   BILITOT 0.3 12/12/2022   ALKPHOS 51 12/12/2022   AST 25 12/12/2022   ALT 17 12/12/2022   ANIONGAP 9 12/13/2022    GFR: Estimated Creatinine Clearance: 30 mL/min (A) (by C-G formula based on SCr of 1.09 mg/dL (H)).  Recent Results (from the past 240 hour(s))  Resp panel by RT-PCR (RSV, Flu A&B, Covid) Anterior Nasal Swab     Status: None   Collection Time: 12/12/22  2:58 PM   Specimen: Anterior Nasal Swab  Result Value Ref Range Status   SARS Coronavirus 2 by RT PCR NEGATIVE NEGATIVE Final    Comment: (NOTE) SARS-CoV-2 target nucleic acids are NOT DETECTED.  The SARS-CoV-2 RNA is generally detectable in upper respiratory specimens during the acute phase of infection. The lowest concentration of SARS-CoV-2 viral copies this assay can detect is 138 copies/mL. A negative result does not preclude SARS-Cov-2 infection and should not be used as the sole basis for treatment or other patient management decisions. A negative result may occur with  improper specimen collection/handling, submission of specimen other than nasopharyngeal swab, presence of viral mutation(s) within the areas targeted by this assay, and inadequate number of viral copies(<138 copies/mL). A negative result must be combined with clinical observations, patient history, and epidemiological information. The expected result is Negative.  Fact Sheet for Patients:   BloggerCourse.com  Fact Sheet for Healthcare Providers:  SeriousBroker.it  This test is no t yet approved or cleared by the [place] FDA and  has been authorized for detection and/or diagnosis of SARS-CoV-2 by FDA under an Emergency Use Authorization (EUA). This EUA will remain  in effect (meaning this test can be used) for the duration of the COVID-19 declaration under Section 564(b)(1) of the Act, 21 U.S.C.section 360bbb-3(b)(1), unless the authorization is terminated  or revoked sooner.       Influenza A by PCR NEGATIVE NEGATIVE Final   Influenza B by PCR NEGATIVE NEGATIVE Final    Comment: (NOTE) The Xpert Xpress SARS-CoV-2/FLU/RSV plus assay is intended as an aid in the diagnosis of influenza from Nasopharyngeal swab specimens and should not be used as a sole basis for treatment. Nasal washings and aspirates are unacceptable for Xpert Xpress SARS-CoV-2/FLU/RSV testing.  Fact Sheet for Patients: BloggerCourse.com  Fact Sheet for Healthcare Providers: SeriousBroker.it  This test is not yet approved or cleared by the [place] FDA and has been authorized for detection and/or diagnosis of SARS-CoV-2 by FDA under an Emergency Use Authorization (EUA). This EUA will remain in effect (meaning this test can be used) for the duration of the COVID-19 declaration under Section 564(b)(1) of the Act, 21 U.S.C. section 360bbb-3(b)(1), unless the authorization is terminated or revoked.     Resp Syncytial Virus by PCR NEGATIVE NEGATIVE Final    Comment: (NOTE) Fact Sheet for Patients: BloggerCourse.com  Fact Sheet for Healthcare Providers: SeriousBroker.it  This test is not yet approved or cleared  by the [place] and has been authorized for detection and/or diagnosis of SARS-CoV-2 by FDA under an Emergency Use  Authorization (EUA). This EUA will remain in effect (meaning this test can be used) for the duration of the COVID-19 declaration under Section 564(b)(1) of the Act, 21 U.S.C. section 360bbb-3(b)(1), unless the authorization is terminated or revoked.  Performed at [facility], [address], [place], [place] [address]       Radiology Studies: CT CHEST WO CONTRAST  Result Date: 12/12/2022 CLINICAL DATA:  Shortness of breath. EXAM: CT CHEST WITHOUT CONTRAST TECHNIQUE: Multidetector CT imaging of the chest was performed following the standard protocol without IV contrast. RADIATION DOSE REDUCTION: This exam was performed according to the departmental dose-optimization program which includes automated exposure control, adjustment of the mA and/or kV according to patient size and/or use of iterative reconstruction technique. COMPARISON:  Chest CT 02/18/2021 FINDINGS: Cardiovascular: Heart is enlarged. There is no pericardial effusion. The aorta is normal in size. There are atherosclerotic calcifications of the aorta and coronary arteries. Mediastinum/Nodes: No enlarged mediastinal or axillary lymph nodes. Thyroid gland, trachea, and esophagus demonstrate no significant findings. Lungs/Pleura: There is a trace right pleural effusion. There is smooth interlobular septal thickening in the lung apices and lung bases. There is a small amount of airspace consolidation with air bronchograms in the right lower lobe. Atelectatic changes are seen in the lingula and medial aspect of the right middle lobe. There are multifocal ill-defined ground-glass and nodular ground-glass opacities scattered throughout both lungs. The largest area is in the right upper lobe measuring 2.1 x 1.5 cm image 4/58. There is no evidence for pneumothorax. Solid nodule noted in the left lower lobe measuring 5 mm image 4/89. Upper Abdomen: No acute abnormality. Musculoskeletal: No chest wall mass or suspicious bone  lesions identified. IMPRESSION: 1. Cardiomegaly with trace right pleural effusion and smooth interlobular septal thickening compatible with pulmonary edema. 2. Small amount of airspace consolidation in the right lower lobe worrisome for pneumonia. 3. Multifocal ill-defined ground-glass and nodular ground-glass opacities scattered throughout both lungs. Findings may be infectious/inflammatory, but neoplasm is not excluded. Follow-up CT recommended in 3 months to re-evaluate. 4. Left solid pulmonary nodule measuring 5 mm. Per Fleischner Society Guidelines, no routine follow-up imaging is recommended. These guidelines do not apply to immunocompromised patients and patients with cancer. Follow up in patients with significant comorbidities as clinically warranted. For lung cancer screening, adhere to Lung-RADS guidelines. Reference: Radiology. 2017; 284(1):228-43. 5. Aortic Atherosclerosis (ICD10-I70.0). Electronically Signed   By: Darliss Cheney M.D.   On: 12/12/2022 21:07   DG Chest Port 1 View  Result Date: 12/12/2022 CLINICAL DATA:  Shortness of breath EXAM: PORTABLE CHEST 1 VIEW COMPARISON:  X-ray 12/12/2021 and older. FINDINGS: Calcified aorta. Enlarged cardiopericardial silhouette. Hyperinflation with some interstitial changes including some Kerley B lines consistent with some interstitial edema. Question tiny pleural effusions. No pneumothorax overlapping cardiac leads. Film is under penetrated. IMPRESSION: Enlarged heart with some interstitial edema.  Calcified aorta. Under penetrated radiograph Electronically Signed   By: Karen Kays M.D.   On: 12/12/2022 15:54      LOS: 1 day    Jacquelin Hawking, MD [facility] 12/13/2022, 8:42 AM   If 7PM-7AM, please contact night-coverage [email or web address]

## 2022-12-13 NOTE — Progress Notes (Signed)
Mobility Specialist - Progress Note  (RA) Pre-mobility: 107 bpm HR, 91% SpO2 During mobility: 112 bpm HR, 90% SpO2 Post-mobility: 97 bpm HR, 94% SPO2   12/13/22 1130  Mobility  Activity Ambulated independently in hallway  Level of Assistance Standby assist, set-up cues, supervision of patient - no hands on  Assistive Device None  Distance Ambulated (ft) 150 ft  Range of Motion/Exercises Active  Activity Response Tolerated well  Mobility Referral Yes  $Mobility charge 1 Mobility  Mobility Specialist Start Time (ACUTE ONLY) 1115  Mobility Specialist Stop Time (ACUTE ONLY) 1130  Mobility Specialist Time Calculation (min) (ACUTE ONLY) 15 min   Pt was found in bed and agreeable to ambulate. Grew fatigued with session. At EOS returned to bed with all needs met. Call bell in reach and RN notified.  Billey Chang Mobility Specialist

## 2022-12-13 NOTE — Progress Notes (Signed)
PHARMACY - PHYSICIAN COMMUNICATION CRITICAL VALUE ALERT - BLOOD CULTURE IDENTIFICATION (BCID)  Kelly [name] is an 82 y.o. female who presented to [facility] on 12/12/2022 with a chief complaint of SOB; treating for CAP  Assessment:  1/4 BCx bottles growing CoNS (not S. epi or lugdunensis); methicillin resistance not tested for this group; suspect contamination at this time  Name of physician (or Provider) ContactedCaleb Popp  Current antibiotics: Rocephin, Azithromycin  Changes to prescribed antibiotics recommended: Would continue CAP coverage as long as no new s/s bacteremia Patient is on recommended antibiotics - No changes needed  Results for orders placed or performed during the hospital encounter of 12/12/22  Blood Culture ID Panel (Reflexed) (Collected: 12/12/2022  8:09 PM)  Result Value Ref Range   Enterococcus faecalis NOT DETECTED NOT DETECTED   Enterococcus Faecium NOT DETECTED NOT DETECTED   Listeria monocytogenes NOT DETECTED NOT DETECTED   Staphylococcus species DETECTED (A) NOT DETECTED   Staphylococcus aureus (BCID) NOT DETECTED NOT DETECTED   Staphylococcus epidermidis NOT DETECTED NOT DETECTED   Staphylococcus lugdunensis NOT DETECTED NOT DETECTED   Streptococcus species NOT DETECTED NOT DETECTED   Streptococcus agalactiae NOT DETECTED NOT DETECTED   Streptococcus pneumoniae NOT DETECTED NOT DETECTED   Streptococcus pyogenes NOT DETECTED NOT DETECTED   A.calcoaceticus-baumannii NOT DETECTED NOT DETECTED   Bacteroides fragilis NOT DETECTED NOT DETECTED   Enterobacterales NOT DETECTED NOT DETECTED   Enterobacter cloacae complex NOT DETECTED NOT DETECTED   Escherichia coli NOT DETECTED NOT DETECTED   Klebsiella aerogenes NOT DETECTED NOT DETECTED   Klebsiella oxytoca NOT DETECTED NOT DETECTED   Klebsiella pneumoniae NOT DETECTED NOT DETECTED   Proteus species NOT DETECTED NOT DETECTED   Salmonella species NOT DETECTED NOT DETECTED   Serratia marcescens NOT  DETECTED NOT DETECTED   Haemophilus influenzae NOT DETECTED NOT DETECTED   Neisseria meningitidis NOT DETECTED NOT DETECTED   Pseudomonas aeruginosa NOT DETECTED NOT DETECTED   Stenotrophomonas maltophilia NOT DETECTED NOT DETECTED   Candida albicans NOT DETECTED NOT DETECTED   Candida auris NOT DETECTED NOT DETECTED   Candida glabrata NOT DETECTED NOT DETECTED   Candida krusei NOT DETECTED NOT DETECTED   Candida parapsilosis NOT DETECTED NOT DETECTED   Candida tropicalis NOT DETECTED NOT DETECTED   Cryptococcus neoformans/gattii NOT DETECTED NOT DETECTED    Anyelo Mccue A 12/13/2022  6:12 PM

## 2022-12-13 NOTE — Evaluation (Addendum)
Clinical/Bedside Swallow Evaluation Patient Details  Name: Kelly [name] MRN: [id number] Date of Birth: September 24, 1940  Today's Date: 12/13/2022 Time: SLP Start Time (ACUTE ONLY): 1145 SLP Stop Time (ACUTE ONLY): 1222 SLP Time Calculation (min) (ACUTE ONLY): 37 min  Past Medical History:  Past Medical History:  Diagnosis Date   Anemia    Anxiety    Arthritis    B12 deficiency 05/2015   B12 was 184   Cancer (HCC)    CHF (congestive heart failure) (HCC)    COPD (chronic obstructive pulmonary disease) (HCC)    Crohn's disease, small intestine (HCC) 02/19/2009   Depression 10/2013   chronic recurrent major depressive disorder   Dysrhythmia    H/O vitamin D deficiency 08/04/2008   Heart murmur    Hypertension    Hypothyroidism    Pneumonia    Recurrent dislocation of hip, right    Past Surgical History:  Past Surgical History:  Procedure Laterality Date   BIOPSY  09/22/2021   Procedure: BIOPSY;  Surgeon: Napoleon Form, MD;  Location: [facility];  Service: Gastroenterology;;   CYSTOSCOPY WITH RETROGRADE PYELOGRAM, URETEROSCOPY AND STENT PLACEMENT Left 02/17/2021   Procedure: CYSTOSCOPY WITH  RETROGRADE PYELOGRAM, LEFT URETEROSCOPY , BIOPSY AND tumor ablation  STENT PLACEMENT;  Surgeon: Sebastian Ache, MD;  Location: [facility];  Service: Urology;  Laterality: Left;   ESOPHAGOGASTRODUODENOSCOPY (EGD) WITH PROPOFOL N/A 09/22/2021   Procedure: ESOPHAGOGASTRODUODENOSCOPY (EGD) WITH PROPOFOL;  Surgeon: Napoleon Form, MD;  Location: [facility];  Service: Gastroenterology;  Laterality: N/A;   HOLMIUM LASER APPLICATION Left 02/17/2021   Procedure: HOLMIUM LASER APPLICATION;  Surgeon: Sebastian Ache, MD;  Location: [facility];  Service: Urology;  Laterality: Left;   JOINT REPLACEMENT     Right hip, 2015   LAPAROSCOPY  06/03/2016   Duodenal ulcer repair   TONSILLECTOMY     TUBAL LIGATION     HPI:  82 yo female adm to [facility] with respiratory difficulties, PMH + for gastric ulcer, COPD,  GERD, gastric ulcer repair, chest imaging concerning for pna.  Swallow evaluation completed.  Pt chest imaging concerning for pna and found to have ground glass opacities.    Assessment / Plan / Recommendation  Clinical Impression  Patient alert and friendly, denies h/o dysphagia - occasional choking with foods and difficulties with pills *requiring her to distract herself when swallowing*.  No focal CN deficits apparent - poor dentition noted but pt is chosing foods she can manage. She did not pass 3 ounce Yale water challenge due to requiring frequent  rest breaks and then coughing after completion.  Observed her consuming her lunch including mashed potatoes, beef, and coffee.  No clinical indication of dysphagia with lunch meal and respiratory status stable throughout.   Can not rule out silent aspiration given pt not passing 3 ounce Yale swallow screen- however doubtful this is occuring. Pt reports primary issues is with food only - coughing sometimes and pills sticking.  SLP advised her to alternative means to take her medications - for which she advised she may  try.  Known h/o GERD - and pt was placed on PPI (protonix) 40 mg BID by Dr Lavon Paganini in 09/2021 after endoscopy showed ulcer with stigmata.  Advised to general aspiration precautions.     No SLP follow up needed. Thanks for this consult.   SLP Visit Diagnosis: Dysphagia, unspecified (R13.10)    Aspiration Risk  Mild aspiration risk    Diet Recommendation Regular;Thin liquid    Liquid Administration via: Cup;Straw  Medication Administration: Other (Comment) (as tolerated) Supervision: Patient able to self feed Compensations: Slow rate;Small sips/bites Postural Changes: Seated upright at 90 degrees    Other  Recommendations Oral Care Recommendations: Oral care BID    Recommendations for follow up therapy are one component of a multi-disciplinary discharge planning process, led by the attending physician.  Recommendations may be  updated based on patient status, additional functional criteria and insurance authorization.  Follow up Recommendations No SLP follow up      Assistance Recommended at Discharge  N/a  Functional Status Assessment Patient has not had a recent decline in their functional status  Frequency and Duration            Prognosis   N/a     Swallow Study   General Date of Onset: 12/13/22 HPI: 82 yo female adm to [facility] with respiratory difficulties, PMH + for gastric ulcer, COPD, GERD, gastric ulcer repair, chest imaging concerning for pna.  Swallow evaluation completed.  Pt chest imaging concerning for pna and found to have ground glass opacities. Type of Study: Bedside Swallow Evaluation Previous Swallow Assessment: endoscopy:  09/2021 gastritis, Protonix 40 BID ordered per GI Diet Prior to this Study: Regular;Thin liquids (Level 0) Temperature Spikes Noted: No Respiratory Status: Room air History of Recent Intubation: No Behavior/Cognition: Alert;Cooperative;Pleasant mood Oral Cavity Assessment: Within Functional Limits Oral Care Completed by SLP: No Oral Cavity - Dentition: Other (Comment);Missing dentition (missing tooth) Vision: Functional for self-feeding Self-Feeding Abilities: Able to feed self Patient Positioning: Upright in bed Baseline Vocal Quality: Normal Volitional Cough: Strong Volitional Swallow: Able to elicit    Oral/Motor/Sensory Function Overall Oral Motor/Sensory Function: Within functional limits   Ice Chips Ice chips: Not tested   Thin Liquid Thin Liquid: Within functional limits Presentation: Cup Other Comments: cough after sequential swallows; not with single small sips    Nectar Thick Nectar Thick Liquid: Not tested   Honey Thick Honey Thick Liquid: Not tested   Puree Puree: Within functional limits Presentation: Self Fed;Spoon   Solid     Solid: Within functional limits Presentation: Self Fed      Chales Abrahams 12/13/2022,12:47 PM  Rolena Infante,  MS [facility] SLP Acute Rehab Services Office [phone]

## 2022-12-14 ENCOUNTER — Inpatient Hospital Stay (HOSPITAL_COMMUNITY): Payer: Medicare Other

## 2022-12-14 ENCOUNTER — Other Ambulatory Visit: Payer: Self-pay | Admitting: Home Health

## 2022-12-14 DIAGNOSIS — I421 Obstructive hypertrophic cardiomyopathy: Secondary | ICD-10-CM

## 2022-12-14 DIAGNOSIS — R0902 Hypoxemia: Secondary | ICD-10-CM | POA: Diagnosis not present

## 2022-12-14 LAB — HAPTOGLOBIN: Haptoglobin: 128 mg/dL (ref 41–333)

## 2022-12-14 LAB — CBC
HCT: 30.1 % — ABNORMAL LOW (ref 36.0–46.0)
Hemoglobin: 9 g/dL — ABNORMAL LOW (ref 12.0–15.0)
MCH: 31.4 pg (ref 26.0–34.0)
MCHC: 29.9 g/dL — ABNORMAL LOW (ref 30.0–36.0)
MCV: 104.9 fL — ABNORMAL HIGH (ref 80.0–100.0)
Platelets: 235 10*3/uL (ref 150–400)
RBC: 2.87 MIL/uL — ABNORMAL LOW (ref 3.87–5.11)
RDW: 17.6 % — ABNORMAL HIGH (ref 11.5–15.5)
WBC: 15.9 10*3/uL — ABNORMAL HIGH (ref 4.0–10.5)
nRBC: 0 % (ref 0.0–0.2)

## 2022-12-14 LAB — MAGNESIUM: Magnesium: 2.2 mg/dL (ref 1.7–2.4)

## 2022-12-14 LAB — BASIC METABOLIC PANEL
Anion gap: 10 (ref 5–15)
BUN: 27 mg/dL — ABNORMAL HIGH (ref 8–23)
CO2: 21 mmol/L — ABNORMAL LOW (ref 22–32)
Calcium: 8.2 mg/dL — ABNORMAL LOW (ref 8.9–10.3)
Chloride: 112 mmol/L — ABNORMAL HIGH (ref 98–111)
Creatinine, Ser: 1 mg/dL (ref 0.44–1.00)
GFR, Estimated: 56 mL/min — ABNORMAL LOW (ref >60)
Glucose, Bld: 134 mg/dL — ABNORMAL HIGH (ref 70–99)
Potassium: 4.1 mmol/L (ref 3.5–5.1)
Sodium: 143 mmol/L (ref 135–145)

## 2022-12-14 MED ORDER — SODIUM CHLORIDE 0.9 % IV SOLN
2.0000 g | Freq: Two times a day (BID) | INTRAVENOUS | Status: DC
  Administered 2022-12-14 – 2022-12-19 (×10): 2 g via INTRAVENOUS
  Filled 2022-12-14 (×10): qty 12.5

## 2022-12-14 MED ORDER — FUROSEMIDE 10 MG/ML IJ SOLN
20.0000 mg | Freq: Once | INTRAMUSCULAR | Status: AC
  Administered 2022-12-14: 20 mg via INTRAVENOUS
  Filled 2022-12-14: qty 2

## 2022-12-14 MED ORDER — METOPROLOL SUCCINATE ER 50 MG PO TB24
50.0000 mg | ORAL_TABLET | Freq: Every day | ORAL | Status: DC
  Administered 2022-12-15 – 2022-12-16 (×2): 50 mg via ORAL
  Filled 2022-12-14 (×2): qty 1

## 2022-12-14 NOTE — Progress Notes (Signed)
Patient expressed that she was SOB upon ambulating to BR and back this AM. Vitals checked, pt was 96% on RA but placed on 2L O2 [place] for comfort. Respirations were elevated to 30. Scheduled albuterol treatment given with some relief. Dr. Sunnie Nielsen aware, CXR and cardiology consult ordered. Lasix ordered by cardiology; administered. Patient is voiding well and symptomatically reports she feels better. Fever of 101.13F this afternoon with no new complaints; tylenol administered. Dr. Sunnie Nielsen aware; blood cultures and cefepime ordered; administered. 98.3 upon recheck. Patient is resting comfortably in the bed at this time.

## 2022-12-14 NOTE — Progress Notes (Signed)
PROGRESS NOTE    Kelly [name]  VWU:[id number] DOB: [birth year] DOA: 12/12/2022 PCP: Trisha Mangle, FNP   Brief Narrative: 82 year old with past medical history significant for COPD, heart failure presents with shortness of breath found to have pneumonia respiratory failure, also in the differential heart failure   Assessment & Plan:   Principal Problem:   Hypoxia Active Problems:   Anemia, iron deficiency   Severe concentric left ventricular hypertrophy   Paroxysmal atrial fibrillation (HCC)   AKI (acute kidney injury) (HCC)   CAP (community acquired pneumonia)  1-Acute hypoxic respiratory failure Community-acquired pneumonia Interstitial pulmonary edema -worsening dyspnea this am on exertion. Chest x ray improved edema.  -spike fevr. Change ceftriaxone to cefepime./  -repeat blood culture.  -Cardiology consulted for HF -continue nebulizer.   AKI: Resolved.  Cr down to 1.0 form 1.4  COPD:Continue with Incruse, Breo. Albuterol.   Paroxysmal A-fib: Continue with Cardizem and eliquis.   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 Cardiology consulted.  -received IV lasix during this hospitalization.  IV lasix times one today.   Blood culture positive for Staph Hominis.  Repeat Blood culture.   Iron deficiency anemia: Continue with ferrous sulfate.   Hyperlipidemia: On Crestor.   Depression: Continue with remeron.   Groundglass opacity: Needs follow up CT chest     Estimated body mass index is 19.27 kg/m as calculated from the following:   Height as of this encounter: 5\' 2"  (1.575 m).   Weight as of this encounter: 47.8 kg.   DVT prophylaxis: Eliquis Code Status: full code Family Communication: Care discussed with patient.  Disposition Plan:  Status is: Inpatient Remains inpatient appropriate because: management of PNA    Consultants:  Cardiology   Procedures:   ECHO  Antimicrobials:  Azithromycin Cefepime.   Subjective: She report worsening dyspnea today with exertion. After coming from bathroom develops worsening SOB, improved after some rest. Report cough  Objective: Vitals:   12/13/22 2100 12/14/22 0308 12/14/22 0649 12/14/22 0753  BP:  (!) 128/58 119/62 (!) 140/62  Pulse:  83 80   Resp:  15 17 (!) 30  Temp:  98.7 F (37.1 C) 98.8 F (37.1 C) 97.8 F (36.6 C)  TempSrc:   Oral Oral  SpO2: 98% 93% 95% 96%  Weight:      Height:        Intake/Output Summary (Last 24 hours) at 12/14/2022 0758 Last data filed at 12/14/2022 0347 Gross per 24 hour  Intake 918.92 ml  Output --  Net 918.92 ml   Filed Weights   12/12/22 1903 12/12/22 2200  Weight: 47.6 kg 47.8 kg    Examination:  General exam: Appears calm and comfortable  Respiratory system: BL crackles.  Cardiovascular system: S1 & S2 heard, RRR. No JVD, murmurs, rubs, gallops or clicks. No pedal edema. Gastrointestinal system: Abdomen is nondistended, soft and nontender. No organomegaly or masses felt. Normal bowel sounds heard. Central nervous system: Alert and oriented. No focal neurological deficits. Extremities: Symmetric 5 x 5 power.    Data Reviewed: I have personally reviewed following labs and imaging studies  CBC: Recent Labs  Lab 12/12/22 1453 12/12/22 1845 12/13/22 0633 12/14/22 0427  WBC 13.3* 13.0* 7.1 15.9*  NEUTROABS 12.0*  --   --   --   HGB 7.7* 7.5* 8.4* 9.0*  HCT 25.2* 25.4* 27.7* 30.1*  MCV 103.7* 105.8* 102.6* 104.9*  PLT 280 301 215 235   Basic Metabolic Panel: Recent Labs  Lab 12/12/22 1453 12/12/22 2306 12/13/22 0633 12/14/22 0427  NA 136  --  139 143  K 3.8  --  4.2 4.1  CL 105  --  110 112*  CO2 20*  --  20* 21*  GLUCOSE 225*  --  174* 134*  BUN 25*  --  29* 27*  CREATININE 1.39* 1.40* 1.09* 1.00  CALCIUM 8.9  --  8.5* 8.2*   GFR: Estimated Creatinine Clearance: 32.7 mL/min (by C-G formula based on SCr of 1  mg/dL). Liver Function Tests: Recent Labs  Lab 12/12/22 2306  AST 25  ALT 17  ALKPHOS 51  BILITOT 0.3  PROT 6.5  ALBUMIN 3.6   No results for input(s): "LIPASE", "AMYLASE" in the last 168 hours. No results for input(s): "AMMONIA" in the last 168 hours. Coagulation Profile: Recent Labs  Lab 12/13/22 0633  INR 1.3*   Cardiac Enzymes: No results for input(s): "CKTOTAL", "CKMB", "CKMBINDEX", "TROPONINI" in the last 168 hours. BNP (last 3 results) No results for input(s): "PROBNP" in the last 8760 hours. HbA1C: No results for input(s): "HGBA1C" in the last 72 hours. CBG: No results for input(s): "GLUCAP" in the last 168 hours. Lipid Profile: No results for input(s): "CHOL", "HDL", "LDLCALC", "TRIG", "CHOLHDL", "LDLDIRECT" in the last 72 hours. Thyroid Function Tests: No results for input(s): "TSH", "T4TOTAL", "FREET4", "T3FREE", "THYROIDAB" in the last 72 hours. Anemia Panel: Recent Labs    12/12/22 1845  VITAMINB12 290  FOLATE 44.8  FERRITIN 54  TIBC 486*  IRON 24*  RETICCTPCT 5.3*   Sepsis Labs: No results for input(s): "PROCALCITON", "LATICACIDVEN" in the last 168 hours.  Recent Results (from the past 240 hour(s))  Resp panel by RT-PCR (RSV, Flu A&B, Covid) Anterior Nasal Swab     Status: None   Collection Time: 12/12/22  2:58 PM   Specimen: Anterior Nasal Swab  Result Value Ref Range Status   SARS Coronavirus 2 by RT PCR NEGATIVE NEGATIVE Final    Comment: (NOTE) SARS-CoV-2 target nucleic acids are NOT DETECTED.  The SARS-CoV-2 RNA is generally detectable in upper respiratory specimens during the acute phase of infection. The lowest concentration of SARS-CoV-2 viral copies this assay can detect is 138 copies/mL. A negative result does not preclude SARS-Cov-2 infection and should not be used as the sole basis for treatment or other patient management decisions. A negative result may occur with  improper specimen collection/handling, submission of specimen  other than nasopharyngeal swab, presence of viral mutation(s) within the areas targeted by this assay, and inadequate number of viral copies(<138 copies/mL). A negative result must be combined with clinical observations, patient history, and epidemiological information. The expected result is Negative.  Fact Sheet for Patients:  BloggerCourse.com  Fact Sheet for Healthcare Providers:  SeriousBroker.it  This test is no t yet approved or cleared by the [place] FDA and  has been authorized for detection and/or diagnosis of SARS-CoV-2 by FDA under an Emergency Use Authorization (EUA). This EUA will remain  in effect (meaning this test can be used) for the duration of the COVID-19 declaration under Section 564(b)(1) of the Act, 21 U.S.C.section 360bbb-3(b)(1), unless the authorization is terminated  or revoked sooner.       Influenza A by PCR NEGATIVE NEGATIVE Final   Influenza B by PCR NEGATIVE NEGATIVE Final    Comment: (NOTE) The Xpert Xpress SARS-CoV-2/FLU/RSV plus assay is intended as an aid in the diagnosis of influenza from Nasopharyngeal swab specimens and should not be used as a sole  basis for treatment. Nasal washings and aspirates are unacceptable for Xpert Xpress SARS-CoV-2/FLU/RSV testing.  Fact Sheet for Patients: BloggerCourse.com  Fact Sheet for Healthcare Providers: SeriousBroker.it  This test is not yet approved or cleared by the [place] FDA and has been authorized for detection and/or diagnosis of SARS-CoV-2 by FDA under an Emergency Use Authorization (EUA). This EUA will remain in effect (meaning this test can be used) for the duration of the COVID-19 declaration under Section 564(b)(1) of the Act, 21 U.S.C. section 360bbb-3(b)(1), unless the authorization is terminated or revoked.     Resp Syncytial Virus by PCR NEGATIVE NEGATIVE Final     Comment: (NOTE) Fact Sheet for Patients: BloggerCourse.com  Fact Sheet for Healthcare Providers: SeriousBroker.it  This test is not yet approved or cleared by the [place] FDA and has been authorized for detection and/or diagnosis of SARS-CoV-2 by FDA under an Emergency Use Authorization (EUA). This EUA will remain in effect (meaning this test can be used) for the duration of the COVID-19 declaration under Section 564(b)(1) of the Act, 21 U.S.C. section 360bbb-3(b)(1), unless the authorization is terminated or revoked.  Performed at [facility], [address] [place], [place] [address]   Culture, blood (Routine X 2) w Reflex to ID Panel     Status: None (Preliminary result)   Collection Time: 12/12/22  8:09 PM   Specimen: BLOOD LEFT FOREARM  Result Value Ref Range Status   Specimen Description   Final    BLOOD LEFT FOREARM Performed at [facility], [address] [place], [place] [address]    Special Requests   Final    BOTTLES DRAWN AEROBIC AND ANAEROBIC Blood Culture adequate volume Performed at [facility], [address], [place], [place] [address]    Culture  Setup Time   Final    GRAM POSITIVE COCCI ANAEROBIC BOTTLE ONLY CRITICAL RESULT CALLED TO, READ BACK BY AND VERIFIED WITH: Roni Bread [id number] @ 1807 FH Performed at [facility], [address], [place], [place] [address]    Culture GRAM POSITIVE COCCI  Final   Report Status PENDING  Incomplete  Blood Culture ID Panel (Reflexed)     Status: Abnormal   Collection Time: 12/12/22  8:09 PM  Result Value Ref Range Status   Enterococcus faecalis NOT DETECTED NOT DETECTED Final   Enterococcus Faecium NOT DETECTED NOT DETECTED Final   Listeria monocytogenes NOT DETECTED NOT DETECTED Final   Staphylococcus species DETECTED (A) NOT DETECTED Final    Comment: CRITICAL RESULT CALLED TO, READ BACK BY AND  VERIFIED WITH: PHARMD D. [id number] @ 1807 FH    Staphylococcus aureus (BCID) NOT DETECTED NOT DETECTED Final   Staphylococcus epidermidis NOT DETECTED NOT DETECTED Final   Staphylococcus lugdunensis NOT DETECTED NOT DETECTED Final   Streptococcus species NOT DETECTED NOT DETECTED Final   Streptococcus agalactiae NOT DETECTED NOT DETECTED Final   Streptococcus pneumoniae NOT DETECTED NOT DETECTED Final   Streptococcus pyogenes NOT DETECTED NOT DETECTED Final   A.calcoaceticus-baumannii NOT DETECTED NOT DETECTED Final   Bacteroides fragilis NOT DETECTED NOT DETECTED Final   Enterobacterales NOT DETECTED NOT DETECTED Final   Enterobacter cloacae complex NOT DETECTED NOT DETECTED Final   Escherichia coli NOT DETECTED NOT DETECTED Final   Klebsiella aerogenes NOT DETECTED NOT DETECTED Final   Klebsiella oxytoca NOT DETECTED NOT DETECTED Final   Klebsiella pneumoniae NOT DETECTED NOT DETECTED Final   Proteus species NOT DETECTED NOT DETECTED Final  Salmonella species NOT DETECTED NOT DETECTED Final   Serratia marcescens NOT DETECTED NOT DETECTED Final   Haemophilus influenzae NOT DETECTED NOT DETECTED Final   Neisseria meningitidis NOT DETECTED NOT DETECTED Final   Pseudomonas aeruginosa NOT DETECTED NOT DETECTED Final   Stenotrophomonas maltophilia NOT DETECTED NOT DETECTED Final   Candida albicans NOT DETECTED NOT DETECTED Final   Candida auris NOT DETECTED NOT DETECTED Final   Candida glabrata NOT DETECTED NOT DETECTED Final   Candida krusei NOT DETECTED NOT DETECTED Final   Candida parapsilosis NOT DETECTED NOT DETECTED Final   Candida tropicalis NOT DETECTED NOT DETECTED Final   Cryptococcus neoformans/gattii NOT DETECTED NOT DETECTED Final    Comment: Performed at [facility], [address], [place], [place] [address]  Culture, blood (Routine X 2) w Reflex to ID Panel     Status: None (Preliminary result)   Collection Time: 12/12/22  8:20 PM   Specimen: BLOOD  Result  Value Ref Range Status   Specimen Description   Final    BLOOD BLOOD LEFT HAND Performed at [facility], [address] [place], [place] [address]    Special Requests   Final    BOTTLES DRAWN AEROBIC AND ANAEROBIC Blood Culture results may not be optimal due to an inadequate volume of blood received in culture bottles Performed at [facility], [address], [place], [place] [address]    Culture   Final    NO GROWTH 2 DAYS Performed at [facility], [address], [place], [place] [address]    Report Status PENDING  Incomplete  Respiratory (~20 pathogens) panel by PCR     Status: None   Collection Time: 12/13/22  9:15 AM   Specimen: Nasopharyngeal Swab; Respiratory  Result Value Ref Range Status   Adenovirus NOT DETECTED NOT DETECTED Final   Coronavirus 229E NOT DETECTED NOT DETECTED Final    Comment: (NOTE) The Coronavirus on the Respiratory Panel, DOES NOT test for the novel  Coronavirus (2019 nCoV)    Coronavirus HKU1 NOT DETECTED NOT DETECTED Final   Coronavirus NL63 NOT DETECTED NOT DETECTED Final   Coronavirus OC43 NOT DETECTED NOT DETECTED Final   Metapneumovirus NOT DETECTED NOT DETECTED Final   Rhinovirus / Enterovirus NOT DETECTED NOT DETECTED Final   Influenza A NOT DETECTED NOT DETECTED Final   Influenza B NOT DETECTED NOT DETECTED Final   Parainfluenza Virus 1 NOT DETECTED NOT DETECTED Final   Parainfluenza Virus 2 NOT DETECTED NOT DETECTED Final   Parainfluenza Virus 3 NOT DETECTED NOT DETECTED Final   Parainfluenza Virus 4 NOT DETECTED NOT DETECTED Final   Respiratory Syncytial Virus NOT DETECTED NOT DETECTED Final   Bordetella pertussis NOT DETECTED NOT DETECTED Final   Bordetella Parapertussis NOT DETECTED NOT DETECTED Final   Chlamydophila pneumoniae NOT DETECTED NOT DETECTED Final   Mycoplasma pneumoniae NOT DETECTED NOT DETECTED Final    Comment: Performed at [facility], [address],  [place], [place] [address]         Radiology Studies: ECHOCARDIOGRAM COMPLETE  Result Date: 12/13/2022    ECHOCARDIOGRAM REPORT   Patient Name:   Wilfred Rauch Date of Exam: 12/13/2022 Medical Rec #:  [id number]      Height:       62.0 in Accession #:    [id number]     Weight:       105.4 lb Date of Birth:  May 18, 1940      BSA:  1.456 m Patient Age:    82 years       BP:           138/68 mmHg Patient Gender: F              HR:           99 bpm. Exam Location:  Inpatient Procedure: 2D Echo, Color Doppler and Cardiac Doppler Indications:    CHF-Acute Diastolic I50.31  History:        Patient has prior history of Echocardiogram examinations, most                 recent 02/28/2021. CHF, COPD; Signs/Symptoms:Murmur.  Sonographer:    Darlys Gales Referring Phys: [id number] [facility] GOEL IMPRESSIONS  1. There is a severe dynamic LVOT obstruction from concentric hypertrophy and mitral valve SAM. Peak gradient with valsalva. Left ventricular ejection fraction, by estimation, is 65 to 70%. The left ventricle has normal function. The left ventricle has no regional wall motion abnormalities. There is severe concentric left ventricular hypertrophy. Left ventricular diastolic parameters are consistent with Grade III diastolic dysfunction (restrictive). Elevated left atrial pressure.  2. Right ventricular systolic function is normal. The right ventricular size is normal.  3. Left atrial size was severely dilated.  4. The mitral valve is normal in structure. Moderate mitral valve regurgitation.  5. The aortic valve is normal in structure. Aortic valve regurgitation is not visualized. FINDINGS  Left Ventricle: There is a severe dynamic LVOT obstruction from concentric hypertrophy and mitral valve SAM. Peak gradient with valsalva. Left ventricular ejection fraction, by estimation, is 65 to 70%. The left ventricle has normal function. The left ventricle has no regional wall motion abnormalities. The left  ventricular internal cavity size was small. There is severe concentric left ventricular hypertrophy. Left ventricular diastolic parameters are consistent with Grade III diastolic dysfunction (restrictive). Elevated left atrial pressure. Right Ventricle: The right ventricular size is normal. No increase in right ventricular wall thickness. Right ventricular systolic function is normal. Left Atrium: Left atrial size was severely dilated. Right Atrium: Right atrial size was normal in size. Pericardium: Trivial pericardial effusion is present. Mitral Valve: The mitral valve is normal in structure. There is mild thickening of the mitral valve leaflet(s). Mild mitral annular calcification. Moderate mitral valve regurgitation. Tricuspid Valve: The tricuspid valve is normal in structure. Tricuspid valve regurgitation is mild. Aortic Valve: The aortic valve is normal in structure. Aortic valve regurgitation is not visualized. Pulmonic Valve: The pulmonic valve was grossly normal. Pulmonic valve regurgitation is not visualized. Aorta: The aortic root and ascending aorta are structurally normal, with no evidence of dilitation. IAS/Shunts: No atrial level shunt detected by color flow Doppler.  LEFT VENTRICLE PLAX 2D LVIDd:         3.50 cm   Diastology LVIDs:         2.00 cm   LV e' medial:    6.74 cm/s LV PW:         1.40 cm   LV E/e' medial:  37.8 LV IVS:        1.80 cm   LV e' lateral:   13.10 cm/s LVOT diam:     1.80 cm   LV E/e' lateral: 19.5 LVOT Area:     2.54 cm  LEFT ATRIUM             Index        RIGHT ATRIUM  Index LA Vol (A2C):   88.5 ml 60.78 ml/m  RA Area:     12.60 cm LA Vol (A4C):   42.4 ml 29.12 ml/m  RA Volume:   23.50 ml  16.14 ml/m LA Biplane Vol: 65.4 ml 44.92 ml/m   AORTA Ao Root diam: 3.30 cm Ao Asc diam:  3.50 cm MITRAL VALVE MV Area (PHT): 3.54 cm     SHUNTS MV Decel Time: 214 msec     Systemic Diam: 1.80 cm MV E velocity: 255.00 cm/s Clearnce Hasten Electronically signed by Clearnce Hasten Signature Date/Time: 12/13/2022/9:44:34 AM    Final    CT CHEST WO CONTRAST  Result Date: 12/12/2022 CLINICAL DATA:  Shortness of breath. EXAM: CT CHEST WITHOUT CONTRAST TECHNIQUE: Multidetector CT imaging of the chest was performed following the standard protocol without IV contrast. RADIATION DOSE REDUCTION: This exam was performed according to the departmental dose-optimization program which includes automated exposure control, adjustment of the mA and/or kV according to patient size and/or use of iterative reconstruction technique. COMPARISON:  Chest CT 02/18/2021 FINDINGS: Cardiovascular: Heart is enlarged. There is no pericardial effusion. The aorta is normal in size. There are atherosclerotic calcifications of the aorta and coronary arteries. Mediastinum/Nodes: No enlarged mediastinal or axillary lymph nodes. Thyroid gland, trachea, and esophagus demonstrate no significant findings. Lungs/Pleura: There is a trace right pleural effusion. There is smooth interlobular septal thickening in the lung apices and lung bases. There is a small amount of airspace consolidation with air bronchograms in the right lower lobe. Atelectatic changes are seen in the lingula and medial aspect of the right middle lobe. There are multifocal ill-defined ground-glass and nodular ground-glass opacities scattered throughout both lungs. The largest area is in the right upper lobe measuring 2.1 x 1.5 cm image 4/58. There is no evidence for pneumothorax. Solid nodule noted in the left lower lobe measuring 5 mm image 4/89. Upper Abdomen: No acute abnormality. Musculoskeletal: No chest wall mass or suspicious bone lesions identified. IMPRESSION: 1. Cardiomegaly with trace right pleural effusion and smooth interlobular septal thickening compatible with pulmonary edema. 2. Small amount of airspace consolidation in the right lower lobe worrisome for pneumonia. 3. Multifocal ill-defined ground-glass and nodular ground-glass  opacities scattered throughout both lungs. Findings may be infectious/inflammatory, but neoplasm is not excluded. Follow-up CT recommended in 3 months to re-evaluate. 4. Left solid pulmonary nodule measuring 5 mm. Per Fleischner Society Guidelines, no routine follow-up imaging is recommended. These guidelines do not apply to immunocompromised patients and patients with cancer. Follow up in patients with significant comorbidities as clinically warranted. For lung cancer screening, adhere to Lung-RADS guidelines. Reference: Radiology. 2017; 284(1):228-43. 5. Aortic Atherosclerosis (ICD10-I70.0). Electronically Signed   By: Darliss Cheney M.D.   On: 12/12/2022 21:07   DG Chest Port 1 View  Result Date: 12/12/2022 CLINICAL DATA:  Shortness of breath EXAM: PORTABLE CHEST 1 VIEW COMPARISON:  X-ray 12/12/2021 and older. FINDINGS: Calcified aorta. Enlarged cardiopericardial silhouette. Hyperinflation with some interstitial changes including some Kerley B lines consistent with some interstitial edema. Question tiny pleural effusions. No pneumothorax overlapping cardiac leads. Film is under penetrated. IMPRESSION: Enlarged heart with some interstitial edema.  Calcified aorta. Under penetrated radiograph Electronically Signed   By: Karen Kays M.D.   On: 12/12/2022 15:54        Scheduled Meds:  albuterol  2.5 mg Nebulization TID   apixaban  2.5 mg Oral BID   diltiazem  240 mg Oral Daily   DULoxetine  60 mg Oral Daily  ezetimibe  10 mg Oral Daily   ferrous sulfate  325 mg Oral Q breakfast   fluticasone furoate-vilanterol  1 puff Inhalation Daily   And   umeclidinium bromide  1 puff Inhalation Daily   metoprolol succinate  25 mg Oral Daily   mirtazapine  7.5 mg Oral QHS   nicotine  14 mg Transdermal Daily   pregabalin  75 mg Oral TID   rosuvastatin  40 mg Oral Daily   sodium chloride flush  3 mL Intravenous Q12H   Continuous Infusions:  azithromycin 500 mg (12/13/22 1813)   cefTRIAXone (ROCEPHIN)   IV 1 g (12/13/22 1659)     LOS: 2 days    Time spent: 35 minutes    Davontay Watlington A Yavuz Kirby, MD [facility]   If 7PM-7AM, please contact night-coverage [email or web address]  12/14/2022, 7:58 AM

## 2022-12-14 NOTE — Progress Notes (Signed)
   12/14/22 2120  BiPAP/CPAP/SIPAP  Reason BIPAP/CPAP not in use Other(comment)   BiPAP currently not in room at this time, pt. remains on 2 lpm n/c. Marland Kitchen

## 2022-12-14 NOTE — Progress Notes (Signed)
Pharmacy Antibiotic Note  Kelly [name] is a 82 y.o. female admitted on 12/12/2022 with pneumonia.  Pharmacy has been consulted for Cefepime dosing.  ID: CAP with underlying COPD -Tmax 101.1, WBC 7.1>16.9 large jump, Scr 1 (not on steroids), Increase O2 requirement  Azithro 11/25>> 3d (sinusitis dosing) Rocephin 11/25>>11/27 Cefepime 11/27 11/25 BCx: 1/4 CoNS, Staph Hominis  (resistance not tested) - treat as contam  Plan: Cefepime 2g IV q 12hrs. Pharmacy will sign off. Please reconsult for further dosing assitance.   Height: 5\' 2"  (157.5 cm) Weight: 47.8 kg (105 lb 6.1 oz) IBW/kg (Calculated) : 50.1  Temp (24hrs), Avg:98.9 F (37.2 C), Min:97.8 F (36.6 C), Max:101.1 F (38.4 C)  Recent Labs  Lab 12/12/22 1453 12/12/22 1845 12/12/22 2306 12/13/22 0633 12/14/22 0427  WBC 13.3* 13.0*  --  7.1 15.9*  CREATININE 1.39*  --  1.40* 1.09* 1.00    Estimated Creatinine Clearance: 32.7 mL/min (by C-G formula based on SCr of 1 mg/dL).    Allergies  Allergen Reactions   Bee Venom Anaphylaxis   Sulfamethoxazole-Trimethoprim Diarrhea, Nausea And Vomiting and Other (See Comments)    GI Intolerance   Trazodone Other (See Comments)    Severe weakness, "unable to get up, and fatigue    Marissia Blackham S. Merilynn Finland, PharmD, BCPS Clinical Staff Pharmacist [email or web address]  Pasty Spillers 12/14/2022 1:14 PM

## 2022-12-14 NOTE — Consult Note (Addendum)
Cardiology Consultation   Patient ID: Kelly [name] MRN: [id number]; DOB: 12/06/1940  Admit date: 12/12/2022 Date of Consult: 12/14/2022  PCP:  Trisha Mangle, FNP   [facility] Providers Cardiologist:  Dr Jacinto Halim   Patient Profile:   Kelly [name] is a 82 y.o. 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  History of Present Illness:   Kelly [name] with above PMH who is currently admitted under medicine service for 2 days at [facility] hospital for acute hypoxic respiratory failure 2/2 community acquired pneumonia. She has been having progressive SOB for 48 hours prior to admission, required transient BIPAP support at ED, now on [facility] oxygen.   She also had AKI and iron deficiency anemia showing on initial labs. BNP was 1067, Hgb was 7.7, Hs trop 32 >28. FOBT negative. Viral screen negative. 1/2 blood culture +Staphylococcus. CT chest without showed cardiomegaly with trace right pleural effusion and smooth interlobular septal thickening compatible with pulmonary edema. Small amount of airspace consolidation in the right lower lobe worrisome for pneumonia.Multifocal ill-defined ground-glass and nodular ground-glass opacities scattered throughout both lungs. Echo yesterday showed severe dynamic LVOT obstruction from concentric hypertrophy and mitral valve SAM. Peak gradient with valsalva. LVEF 65-70%. No RWMA. Severe LVH. Grade III DD. Normal RV. Severe LAE. Moderate MR. She has been on IV antibiotic. Cardiology is consulted for known HOCM.   Upon encounter, she states she has been doing fairly well until 2 days before admission.   She was having increased shortness of breath with activity.  She states at baseline she is able to climb a flight of stairs, shower herself independently.  She has not experienced significant worsening of chronic shortness of breath with these activity until the past 24 -48 hours.  She denied experiencing any kind of chest pain, syncope.  She had some intermittent transient postural dizziness that resolves continuously.  She has been compliant with her cardiac medication.  She denied any orthopnea, leg edema.  She states she has not followed up with Dr. Jacinto Halim due to insurance issue, needs to verify if Dr. Verl Dicker new practice is allowed by her insurance.   She follows Dr Jacinto Halim historically, has known HOCM with severe MR due to [facility], maintained on cardizem 240mg  daily at baseline. She has paroxysmal A fib and takes Eliquis at baseline. She was last seen in the office 05/16/22 for follow up, was doing well from cardiac standpoint. Cardizem was increased to 240 and digoxin was stopped. Most recent lexiscan stress test from 06/22/20 showed normal myocardial perfusion, stress LVEF 44%, low risk. Most recent outpatient Echo was 07/14/21 showed LVEF 57%, unable assess diastolic function, severe LVH, normal global wall motion, severe LAE, No significant valvular stenosis. Increased LVOT velocity due to LVOT  obstruction. Chordal SAM seen. LVOT gradient at least 89 mmHg. Structurally normal mitral valve. Moderate (Grade III) mitral regurgitation. Mild tricuspid regurgitation. Estimated pulmonary artery systolic pressure 41 mmHg.    Past Medical History:  Diagnosis Date   Anemia    Anxiety    Arthritis    B12 deficiency 05/2015   B12 was 184   Cancer (HCC)    CHF (congestive heart failure) (HCC)  COPD (chronic obstructive pulmonary disease) (HCC)    Crohn's disease, small intestine (HCC) 02/19/2009   Depression 10/2013   chronic recurrent major depressive disorder   Dysrhythmia    H/O vitamin D deficiency  08/04/2008   Heart murmur    Hypertension    Hypothyroidism    Pneumonia    Recurrent dislocation of hip, right     Past Surgical History:  Procedure Laterality Date   BIOPSY  09/22/2021   Procedure: BIOPSY;  Surgeon: Napoleon Form, MD;  Location: [facility];  Service: Gastroenterology;;   CYSTOSCOPY WITH RETROGRADE PYELOGRAM, URETEROSCOPY AND STENT PLACEMENT Left 02/17/2021   Procedure: CYSTOSCOPY WITH  RETROGRADE PYELOGRAM, LEFT URETEROSCOPY , BIOPSY AND tumor ablation  STENT PLACEMENT;  Surgeon: Sebastian Ache, MD;  Location: [facility];  Service: Urology;  Laterality: Left;   ESOPHAGOGASTRODUODENOSCOPY (EGD) WITH PROPOFOL N/A 09/22/2021   Procedure: ESOPHAGOGASTRODUODENOSCOPY (EGD) WITH PROPOFOL;  Surgeon: Napoleon Form, MD;  Location: [facility];  Service: Gastroenterology;  Laterality: N/A;   HOLMIUM LASER APPLICATION Left 02/17/2021   Procedure: HOLMIUM LASER APPLICATION;  Surgeon: Sebastian Ache, MD;  Location: [facility];  Service: Urology;  Laterality: Left;   JOINT REPLACEMENT     Right hip, 2015   LAPAROSCOPY  06/03/2016   Duodenal ulcer repair   TONSILLECTOMY     TUBAL LIGATION       Home Medications:  Prior to Admission medications   Medication Sig Start Date End Date Taking? Authorizing Provider  albuterol (VENTOLIN HFA) 108 (90 Base) MCG/ACT inhaler Inhale 2 puffs into the lungs every 6 (six) hours as needed for wheezing or shortness of breath. 03/18/21  Yes Omar Person, MD  Artificial Tears ophthalmic solution Place 1 drop into both eyes 3 (three) times daily as needed (for dryness).   Yes [provider]  carboxymethylcellulose (REFRESH PLUS) 0.5 % SOLN Place 1 drop into both eyes 3 (three) times daily as needed (dry eyes).   Yes [provider]  Cholecalciferol (VITAMIN D3) 50 MCG (2000 UT) TABS Take 2,000 Units by mouth daily.   Yes [provider]  diltiazem (CARDIZEM CD) 240 MG 24 hr capsule Take 1 capsule (240 mg total) by  mouth daily. 05/16/22 05/11/23 Yes Yates Decamp, MD  DULoxetine (CYMBALTA) 60 MG capsule Take 60 mg by mouth daily. 08/19/20  Yes [provider]  ELIQUIS 2.5 MG TABS tablet TAKE 1 TABLET(2.5 MG) BY MOUTH TWICE DAILY Patient taking differently: Take 2.5 mg by mouth 2 (two) times daily. 11/28/22  Yes Yates Decamp, MD  EPINEPHrine 0.3 mg/0.3 mL IJ SOAJ injection Inject 0.3 mg into the muscle as needed for anaphylaxis.   Yes [provider]  ferrous sulfate 325 (65 FE) MG EC tablet Take 1 tablet (325 mg total) by mouth daily with breakfast. 08/02/16  Yes [place], Betty G, MD  Fluticasone-Umeclidin-Vilant (TRELEGY ELLIPTA) 100-62.5-25 MCG/ACT AEPB Inhale 1 puff into the lungs daily. 03/18/21  Yes Omar Person, MD  folic acid (FOLVITE) 1 MG tablet Take 1 mg by mouth daily.   Yes [provider]  metoprolol succinate (TOPROL-XL) 25 MG 24 hr tablet Take 1 tablet (25 mg total) by mouth daily. 09/24/21  Yes Marolyn Haller, MD  mirtazapine (REMERON) 7.5 MG tablet Take 7.5 mg by mouth at bedtime.   Yes [provider]  nicotine (NICODERM CQ - DOSED IN MG/24 HOURS) 21 mg/24hr patch Place 1 patch (21 mg total) onto the skin daily. 06/04/20  Yes Yates Decamp, MD  ondansetron [facility])  4 MG tablet Take 4 mg by mouth every 8 (eight) hours as needed for nausea.   Yes [provider]  OXYGEN Inhale 1-3 L/min into the lungs as needed (shortness of breath).   Yes [provider]  pantoprazole (PROTONIX) 40 MG tablet Take 40 mg by mouth daily.   Yes [provider]  pregabalin (LYRICA) 75 MG capsule Take 75 mg by mouth 3 (three) times daily.   Yes [provider]  rosuvastatin (CRESTOR) 40 MG tablet Take 40 mg by mouth daily. 07/29/21  Yes [provider]  SPIRIVA HANDIHALER 18 MCG inhalation capsule Place 18 mcg into inhaler and inhale daily as needed (for flares).   Yes [provider]  TYLENOL 325 MG tablet Take 975 mg by mouth every 8  (eight) hours as needed for headache or moderate pain (pain score 4-6).   Yes [provider]  Spacer/Aero-Holding Chambers (VORTEX VALVED HOLDING CHAMBER) DEVI by Does not apply route.    [provider]    Inpatient Medications: Scheduled Meds:  albuterol  2.5 mg Nebulization TID   apixaban  2.5 mg Oral BID   diltiazem  240 mg Oral Daily   DULoxetine  60 mg Oral Daily   ezetimibe  10 mg Oral Daily   ferrous sulfate  325 mg Oral Q breakfast   fluticasone furoate-vilanterol  1 puff Inhalation Daily   And   umeclidinium bromide  1 puff Inhalation Daily   metoprolol succinate  25 mg Oral Daily   mirtazapine  7.5 mg Oral QHS   nicotine  14 mg Transdermal Daily   pregabalin  75 mg Oral TID   rosuvastatin  40 mg Oral Daily   sodium chloride flush  3 mL Intravenous Q12H   Continuous Infusions:  azithromycin 500 mg (12/13/22 1813)   cefTRIAXone (ROCEPHIN)  IV 1 g (12/13/22 1659)   PRN Meds: acetaminophen **OR** acetaminophen, ipratropium-albuterol, polyethylene glycol  Allergies:    Allergies  Allergen Reactions   Bee Venom Anaphylaxis   Sulfamethoxazole-Trimethoprim Diarrhea, Nausea And Vomiting and Other (See Comments)    GI Intolerance   Trazodone Other (See Comments)    Severe weakness, "unable to get up, and fatigue    Social History:   Social History   Socioeconomic History   Marital status: Married    Spouse name: Not on file   Number of children: 1   Years of education: Not on file   Highest education level: Not on file  Occupational History   Not on file  Tobacco Use   Smoking status: Former    Current packs/day: 0.00    Average packs/day: 1 pack/day for 58.0 years (58.0 ttl pk-yrs)    Types: Cigarettes    Start date: 01/18/1964    Quit date: 01/17/2022    Years since quitting: 0.9   Smokeless tobacco: Never   Tobacco comments:    trying to quit, wearing nicotine patch. Cautioned against smoking and wearing patch.1/4 ppd 03/18/21  Vaping  Use   Vaping status: Never Used  Substance and Sexual Activity   Alcohol use: Yes    Comment: h/o heavy use, not currently   Drug use: No   Sexual activity: Not Currently  Other Topics Concern   Not on file  Social History Narrative   2 children 1 deceased   Social Determinants of Health   Financial Resource Strain: Low Risk  (09/01/2021)   [facility] from [facility]   Overall  Financial Resource Strain (CARDIA)    Difficulty of Paying Living Expenses: Not hard at all  Food Insecurity: No Food Insecurity (12/12/2022)   Hunger Vital Sign    Worried About Running Out of Food in the Last Year: Never true    Ran Out of Food in the Last Year: Never true  Transportation Needs: No Transportation Needs (12/12/2022)   PRAPARE - Administrator, Civil Service (Medical): No    Lack of Transportation (Non-Medical): No  Physical Activity: Not on file  Stress: Not on file  Social Connections: Not on file  Intimate Partner Violence: Not At Risk (12/12/2022)   Humiliation, Afraid, Rape, and Kick questionnaire    Fear of Current or Ex-Partner: No    Emotionally Abused: No    Physically Abused: No    Sexually Abused: No    Family History:    Family History  Problem Relation Age of Onset   Heart disease Mother    Alcohol abuse Mother    Colon cancer Sister    Breast cancer Sister    Heart attack Brother    Cancer Neg Hx    Diabetes Neg Hx      ROS:  Constitutional: Denied fever, chills, malaise, night sweats Eyes: Denied vision change or loss Ears/Nose/Mouth/Throat: Denied ear ache, sore throat, coughing, sinus pain Cardiovascular: denied chest pain/pressure Respiratory:see HPI  Gastrointestinal: Denied nausea, vomiting, abdominal pain, diarrhea Genital/Urinary: Denied dysuria, hematuria, urinary frequency/urgency Musculoskeletal: Denied muscle ache, joint pain, weakness Skin: Denied rash, wound Neuro: Denied headache,  dizziness, syncope Psych: Denied history of depression/anxiety  Endocrine: Denied history of diabetes     Physical Exam/Data:   Vitals:   12/14/22 0308 12/14/22 0649 12/14/22 0753 12/14/22 0843  BP: (!) 128/58 119/62 (!) 140/62   Pulse: 83 80    Resp: 15 17 (!) 30   Temp: 98.7 F (37.1 C) 98.8 F (37.1 C) 97.8 F (36.6 C)   TempSrc:  Oral Oral   SpO2: 93% 95% 96% 94%  Weight:      Height:        Intake/Output Summary (Last 24 hours) at 12/14/2022 0953 Last data filed at 12/14/2022 0347 Gross per 24 hour  Intake 798.92 ml  Output --  Net 798.92 ml      12/12/2022   10:00 PM 12/12/2022    7:03 PM 05/16/2022    2:08 PM  Last 3 Weights  Weight (lbs) 105 lb 6.1 oz 105 lb 100 lb 3.2 oz  Weight (kg) 47.8 kg 47.628 kg 45.45 kg     Body mass index is 19.27 kg/m.   Vitals:  Vitals:   12/14/22 0753 12/14/22 0843  BP: (!) 140/62   Pulse:    Resp: (!) 30   Temp: 97.8 F (36.6 C)   SpO2: 96% 94%   General Appearance: In no apparent distress, sitting on the commode, cachectic HEENT: Normocephalic, atraumatic.  Neck: Supple, trachea midline, no JVDs Cardiovascular: Regular rate and rhythm, normal S1-S2, grade 3 systolic murmur throughout Respiratory: Resting breathing unlabored, lungs sounds clear to auscultation bilaterally, no use of accessory muscles.  On nasal cannula oxygen mild DOE with conversation  gastrointestinal: Bowel sounds positive, abdomen soft, non-tender Extremities: Able to move all extremities in bed without difficulty, no edema of bilateral lower extremity  Musculoskeletal: Generalized muscle wasting Skin: Intact, warm, dry. No rashes or petechiae noted in exposed areas.  Neurologic: Alert, oriented to person, place and time.  no gross focal neuro deficit Psychiatric: Normal  affect. Mood is appropriate.     EKG:  The EKG was personally reviewed and demonstrates:    EKG from 12/12/22 showed sinus tachycardia 101 bpm, LVH with repolarization  abnormality, old inferolateral TWI, artifact is limiting review   Telemetry:  Telemetry was personally reviewed and demonstrates:    Sinus rhythm 90s, occasional PVC   Relevant CV Studies:   Echo 12/13/22:  1. There is a severe dynamic LVOT obstruction from concentric hypertrophy  and mitral valve SAM. Peak gradient with valsalva. Left  ventricular ejection fraction, by estimation, is 65 to 70%. The left  ventricle has normal function. The left  ventricle has no regional wall motion abnormalities. There is severe  concentric left ventricular hypertrophy. Left ventricular diastolic  parameters are consistent with Grade III diastolic dysfunction  (restrictive). Elevated left atrial pressure.   2. Right ventricular systolic function is normal. The right ventricular  size is normal.   3. Left atrial size was severely dilated.   4. The mitral valve is normal in structure. Moderate mitral valve  regurgitation.   5. The aortic valve is normal in structure. Aortic valve regurgitation is  not visualized.   Laboratory Data:  High Sensitivity Troponin:   Recent Labs  Lab 12/12/22 2306 12/13/22 0109  TROPONINIHS 32* 28*     Chemistry Recent Labs  Lab 12/12/22 1453 12/12/22 2306 12/13/22 0633 12/14/22 0427  NA 136  --  139 143  K 3.8  --  4.2 4.1  CL 105  --  110 112*  CO2 20*  --  20* 21*  GLUCOSE 225*  --  174* 134*  BUN 25*  --  29* 27*  CREATININE 1.39* 1.40* 1.09* 1.00  CALCIUM 8.9  --  8.5* 8.2*  GFRNONAA 38* 38* 51* 56*  ANIONGAP 11  --  9 10    Recent Labs  Lab 12/12/22 2306  PROT 6.5  ALBUMIN 3.6  AST 25  ALT 17  ALKPHOS 51  BILITOT 0.3   Lipids No results for input(s): "CHOL", "TRIG", "HDL", "LABVLDL", "LDLCALC", "CHOLHDL" in the last 168 hours.  Hematology Recent Labs  Lab 12/12/22 1845 12/13/22 0633 12/14/22 0427  WBC 13.0* 7.1 15.9*  RBC 2.40*  2.36* 2.70* 2.87*  HGB 7.5* 8.4* 9.0*  HCT 25.4* 27.7* 30.1*  MCV 105.8* 102.6* 104.9*   MCH 31.3 31.1 31.4  MCHC 29.5* 30.3 29.9*  RDW 16.1* 16.8* 17.6*  PLT 301 215 235   Thyroid No results for input(s): "TSH", "FREET4" in the last 168 hours.  BNP Recent Labs  Lab 12/12/22 1453  BNP 1,067.4*    DDimer No results for input(s): "DDIMER" in the last 168 hours.   Radiology/Studies:  ECHOCARDIOGRAM COMPLETE  Result Date: 12/13/2022    ECHOCARDIOGRAM REPORT   Patient Name:   Hanaan Drone Date of Exam: 12/13/2022 Medical Rec #:  [id number]      Height:       62.0 in Accession #:    [id number]     Weight:       105.4 lb Date of Birth:  10/24/1940      BSA:          1.456 m Patient Age:    82 years       BP:           138/68 mmHg Patient Gender: F              HR:           99  bpm. Exam Location:  Inpatient Procedure: 2D Echo, Color Doppler and Cardiac Doppler Indications:    CHF-Acute Diastolic I50.31  History:        Patient has prior history of Echocardiogram examinations, most                 recent 02/28/2021. CHF, COPD; Signs/Symptoms:Murmur.  Sonographer:    Darlys Gales Referring Phys: [id number] [facility] GOEL IMPRESSIONS  1. There is a severe dynamic LVOT obstruction from concentric hypertrophy and mitral valve SAM. Peak gradient with valsalva. Left ventricular ejection fraction, by estimation, is 65 to 70%. The left ventricle has normal function. The left ventricle has no regional wall motion abnormalities. There is severe concentric left ventricular hypertrophy. Left ventricular diastolic parameters are consistent with Grade III diastolic dysfunction (restrictive). Elevated left atrial pressure.  2. Right ventricular systolic function is normal. The right ventricular size is normal.  3. Left atrial size was severely dilated.  4. The mitral valve is normal in structure. Moderate mitral valve regurgitation.  5. The aortic valve is normal in structure. Aortic valve regurgitation is not visualized. FINDINGS  Left Ventricle: There is a severe dynamic LVOT obstruction from  concentric hypertrophy and mitral valve SAM. Peak gradient with valsalva. Left ventricular ejection fraction, by estimation, is 65 to 70%. The left ventricle has normal function. The left ventricle has no regional wall motion abnormalities. The left ventricular internal cavity size was small. There is severe concentric left ventricular hypertrophy. Left ventricular diastolic parameters are consistent with Grade III diastolic dysfunction (restrictive). Elevated left atrial pressure. Right Ventricle: The right ventricular size is normal. No increase in right ventricular wall thickness. Right ventricular systolic function is normal. Left Atrium: Left atrial size was severely dilated. Right Atrium: Right atrial size was normal in size. Pericardium: Trivial pericardial effusion is present. Mitral Valve: The mitral valve is normal in structure. There is mild thickening of the mitral valve leaflet(s). Mild mitral annular calcification. Moderate mitral valve regurgitation. Tricuspid Valve: The tricuspid valve is normal in structure. Tricuspid valve regurgitation is mild. Aortic Valve: The aortic valve is normal in structure. Aortic valve regurgitation is not visualized. Pulmonic Valve: The pulmonic valve was grossly normal. Pulmonic valve regurgitation is not visualized. Aorta: The aortic root and ascending aorta are structurally normal, with no evidence of dilitation. IAS/Shunts: No atrial level shunt detected by color flow Doppler.  LEFT VENTRICLE PLAX 2D LVIDd:         3.50 cm   Diastology LVIDs:         2.00 cm   LV e' medial:    6.74 cm/s LV PW:         1.40 cm   LV E/e' medial:  37.8 LV IVS:        1.80 cm   LV e' lateral:   13.10 cm/s LVOT diam:     1.80 cm   LV E/e' lateral: 19.5 LVOT Area:     2.54 cm  LEFT ATRIUM             Index        RIGHT ATRIUM           Index LA Vol (A2C):   88.5 ml 60.78 ml/m  RA Area:     12.60 cm LA Vol (A4C):   42.4 ml 29.12 ml/m  RA Volume:   23.50 ml  16.14 ml/m LA  Biplane Vol: 65.4 ml 44.92 ml/m   AORTA Ao Root diam: 3.30 cm Ao Asc  diam:  3.50 cm MITRAL VALVE MV Area (PHT): 3.54 cm     SHUNTS MV Decel Time: 214 msec     Systemic Diam: 1.80 cm MV E velocity: 255.00 cm/s Clearnce Hasten Electronically signed by Clearnce Hasten Signature Date/Time: 12/13/2022/9:44:34 AM    Final    CT CHEST WO CONTRAST  Result Date: 12/12/2022 CLINICAL DATA:  Shortness of breath. EXAM: CT CHEST WITHOUT CONTRAST TECHNIQUE: Multidetector CT imaging of the chest was performed following the standard protocol without IV contrast. RADIATION DOSE REDUCTION: This exam was performed according to the departmental dose-optimization program which includes automated exposure control, adjustment of the mA and/or kV according to patient size and/or use of iterative reconstruction technique. COMPARISON:  Chest CT 02/18/2021 FINDINGS: Cardiovascular: Heart is enlarged. There is no pericardial effusion. The aorta is normal in size. There are atherosclerotic calcifications of the aorta and coronary arteries. Mediastinum/Nodes: No enlarged mediastinal or axillary lymph nodes. Thyroid gland, trachea, and esophagus demonstrate no significant findings. Lungs/Pleura: There is a trace right pleural effusion. There is smooth interlobular septal thickening in the lung apices and lung bases. There is a small amount of airspace consolidation with air bronchograms in the right lower lobe. Atelectatic changes are seen in the lingula and medial aspect of the right middle lobe. There are multifocal ill-defined ground-glass and nodular ground-glass opacities scattered throughout both lungs. The largest area is in the right upper lobe measuring 2.1 x 1.5 cm image 4/58. There is no evidence for pneumothorax. Solid nodule noted in the left lower lobe measuring 5 mm image 4/89. Upper Abdomen: No acute abnormality. Musculoskeletal: No chest wall mass or suspicious bone lesions identified. IMPRESSION: 1. Cardiomegaly with trace  right pleural effusion and smooth interlobular septal thickening compatible with pulmonary edema. 2. Small amount of airspace consolidation in the right lower lobe worrisome for pneumonia. 3. Multifocal ill-defined ground-glass and nodular ground-glass opacities scattered throughout both lungs. Findings may be infectious/inflammatory, but neoplasm is not excluded. Follow-up CT recommended in 3 months to re-evaluate. 4. Left solid pulmonary nodule measuring 5 mm. Per Fleischner Society Guidelines, no routine follow-up imaging is recommended. These guidelines do not apply to immunocompromised patients and patients with cancer. Follow up in patients with significant comorbidities as clinically warranted. For lung cancer screening, adhere to Lung-RADS guidelines. Reference: Radiology. 2017; 284(1):228-43. 5. Aortic Atherosclerosis (ICD10-I70.0). Electronically Signed   By: Darliss Cheney M.D.   On: 12/12/2022 21:07   DG Chest Port 1 View  Result Date: 12/12/2022 CLINICAL DATA:  Shortness of breath EXAM: PORTABLE CHEST 1 VIEW COMPARISON:  X-ray 12/12/2021 and older. FINDINGS: Calcified aorta. Enlarged cardiopericardial silhouette. Hyperinflation with some interstitial changes including some Kerley B lines consistent with some interstitial edema. Question tiny pleural effusions. No pneumothorax overlapping cardiac leads. Film is under penetrated. IMPRESSION: Enlarged heart with some interstitial edema.  Calcified aorta. Under penetrated radiograph Electronically Signed   By: Karen Kays M.D.   On: 12/12/2022 15:54     Assessment and Plan:   HOCM Mitral regurgitation - Echo yesterday showed severe dynamic LVOT obstruction from concentric hypertrophy and mitral valve SAM. Peak gradient with valsalva. LVEF 65-70%. No RWMA. Severe LVH. Grade III DD. Normal RV. Severe LAE. Moderate MR. (LVOT gradient has increased comparing to 07/14/2021 echo) -Clinically speaking, she has been stable from her chronic  shortness of breath with exertion, no angina, syncope, decompensated heart failure symptoms at this time; 48 hours of acute shortness of breath likely due to CAP - avoid tachycardia, maintain euvolemia  -  she is not in A fib currently, continue  PTA diltiazem 240mg  daily and eliquis   - May consider referred to Dr. Izora Ribas outpatient  -She is unlikely a good candidate for surgery due to advanced age and complex comorbidity  Paroxysmal atrial fibrillation -She remains in sinus rhythm at this time -Continue PTA diltiazem and Eliquis  Community-acquired pneumonia COPD Anemia CKD Bacteremia -Per primary team    Risk Assessment/Risk Scores:    New York Heart Association (NYHA) Functional Class NYHA Class II  CHA2DS2-VASc Score = 5  This indicates a 7.2% annual risk of stroke. The patient's score is based upon: CHF History: 1 HTN History: 1 Diabetes History: 0 Stroke History: 0 Vascular Disease History: 0 Age Score: 2 Gender Score: 1    For questions or updates, please contact [facility] Please consult [email or web address] for contact info under   Signed, Cyndi Bender, NP  12/14/2022 9:53 AM As above, patient seen and examined.  Briefly she is an 82 year old female with past medical history of hypertrophic obstructive cardiomyopathy, paroxysmal atrial fibrillation, hypertension, hyperlipidemia, COPD, alcohol abuse, history of GI bleed admitted with possible pneumonia for evaluation of hypertrophic cardiomyopathy and possible acute diastolic congestive heart failure.  Patient has done well until 2 days prior to admission when she complained of worsening dyspnea on exertion.  No orthopnea, PND, pedal edema, chest pain, palpitations or syncope.  No bleeding.  She did have a nonproductive cough and some diaphoresis.  She was admitted and given IV fluids.  Initially her symptoms had improved but now she notes increased dyspnea on exertion.  Cardiology asked to evaluate.  Chest  CT showed probable pulmonary edema with possible right lower lobe pneumonia, groundglass opacities in the lungs and follow-up CT recommended 3 months as well as pulmonary nodule.  Echocardiogram showed normal LV function, severe asymmetric septal hypertrophy, grade 3 diastolic dysfunction, systolic anterior motion of the mitral valve with peak gradient 219 mmHg, severe left atrial enlargement, moderate mitral regurgitation.  Electrocardiogram showed sinus rhythm, left ventricular hypertrophy with repolarization abnormality.  Initial BNP 1067.  Initial hemoglobin 7.7.  1 hypertrophic obstructive cardiomyopathy-patient presented with dyspnea on exertion.  BNP was elevated and chest CT showed pulmonary edema.  She is also being treated for possible pneumonia.  I will give Lasix 20 mg IV x 1 and follow.  She has significant obstructive physiology on her echocardiogram.  Will continue Cardizem at present dose and increase Toprol to 50 mg daily.  Will titrate further as tolerated.  She can follow-up in hypertrophic cardiomyopathy clinic following discharge.  2 pneumonia-antibiotics per primary care.  3 history of paroxysmal atrial fibrillation-she is in sinus rhythm.  Will continue Cardizem and metoprolol for rate control if atrial fibrillation recurs.  Continue apixaban.  4 anemia-significantly anemic at time of admission which could have contributed to some of CHF symptoms.  Hemoglobin now improved.  Further evaluation per primary care.  Would like to keep hemoglobin 9 or greater.  5 hyperlipidemia-continue present regimen.  Olga Millers, MD

## 2022-12-15 DIAGNOSIS — I5033 Acute on chronic diastolic (congestive) heart failure: Secondary | ICD-10-CM

## 2022-12-15 DIAGNOSIS — R0902 Hypoxemia: Secondary | ICD-10-CM | POA: Diagnosis not present

## 2022-12-15 LAB — CBC
HCT: 28.5 % — ABNORMAL LOW (ref 36.0–46.0)
Hemoglobin: 8.7 g/dL — ABNORMAL LOW (ref 12.0–15.0)
MCH: 31.6 pg (ref 26.0–34.0)
MCHC: 30.5 g/dL (ref 30.0–36.0)
MCV: 103.6 fL — ABNORMAL HIGH (ref 80.0–100.0)
Platelets: 219 10*3/uL (ref 150–400)
RBC: 2.75 MIL/uL — ABNORMAL LOW (ref 3.87–5.11)
RDW: 16.9 % — ABNORMAL HIGH (ref 11.5–15.5)
WBC: 9.5 10*3/uL (ref 4.0–10.5)
nRBC: 0 % (ref 0.0–0.2)

## 2022-12-15 LAB — BASIC METABOLIC PANEL
Anion gap: 9 (ref 5–15)
BUN: 20 mg/dL (ref 8–23)
CO2: 22 mmol/L (ref 22–32)
Calcium: 8.1 mg/dL — ABNORMAL LOW (ref 8.9–10.3)
Chloride: 108 mmol/L (ref 98–111)
Creatinine, Ser: 0.93 mg/dL (ref 0.44–1.00)
GFR, Estimated: >60 mL/min (ref >60)
Glucose, Bld: 125 mg/dL — ABNORMAL HIGH (ref 70–99)
Potassium: 3.8 mmol/L (ref 3.5–5.1)
Sodium: 139 mmol/L (ref 135–145)

## 2022-12-15 MED ORDER — ALBUTEROL SULFATE (2.5 MG/3ML) 0.083% IN NEBU
2.5000 mg | INHALATION_SOLUTION | Freq: Two times a day (BID) | RESPIRATORY_TRACT | Status: DC
  Administered 2022-12-15 – 2022-12-19 (×8): 2.5 mg via RESPIRATORY_TRACT
  Filled 2022-12-15 (×8): qty 3

## 2022-12-15 MED ORDER — FUROSEMIDE 10 MG/ML IJ SOLN
20.0000 mg | Freq: Once | INTRAMUSCULAR | Status: AC
  Administered 2022-12-15: 20 mg via INTRAVENOUS
  Filled 2022-12-15: qty 2

## 2022-12-15 MED ORDER — POTASSIUM CHLORIDE CRYS ER 20 MEQ PO TBCR
40.0000 meq | EXTENDED_RELEASE_TABLET | Freq: Once | ORAL | Status: AC
  Administered 2022-12-15: 40 meq via ORAL
  Filled 2022-12-15: qty 2

## 2022-12-15 NOTE — Progress Notes (Signed)
Rounding Note    Patient Name: Kelly [name] Date of Encounter: 12/15/2022  [facility] Cardiologist: Dr Jacinto Halim  Subjective   No CP; dyspnea improved but persists  Inpatient Medications    Scheduled Meds:  albuterol  2.5 mg Nebulization TID   apixaban  2.5 mg Oral BID   diltiazem  240 mg Oral Daily   DULoxetine  60 mg Oral Daily   ezetimibe  10 mg Oral Daily   ferrous sulfate  325 mg Oral Q breakfast   fluticasone furoate-vilanterol  1 puff Inhalation Daily   And   umeclidinium bromide  1 puff Inhalation Daily   metoprolol succinate  50 mg Oral Daily   mirtazapine  7.5 mg Oral QHS   nicotine  14 mg Transdermal Daily   pregabalin  75 mg Oral TID   rosuvastatin  40 mg Oral Daily   sodium chloride flush  3 mL Intravenous Q12H   Continuous Infusions:  ceFEPime (MAXIPIME) IV 2 g (12/15/22 0121)   PRN Meds: acetaminophen **OR** acetaminophen, ipratropium-albuterol, polyethylene glycol   Vital Signs    Vitals:   12/14/22 2017 12/14/22 2110 12/15/22 0500 12/15/22 0509  BP: 123/72   (!) 132/55  Pulse: 77   92  Resp: 17   20  Temp: 98.6 F (37 C)   98.9 F (37.2 C)  TempSrc: Oral   Oral  SpO2: 94% 95%  92%  Weight:   48.2 kg   Height:        Intake/Output Summary (Last 24 hours) at 12/15/2022 0651 Last data filed at 12/15/2022 0345 Gross per 24 hour  Intake 1477.89 ml  Output 900 ml  Net 577.89 ml      12/15/2022    5:00 AM 12/12/2022   10:00 PM 12/12/2022    7:03 PM  Last 3 Weights  Weight (lbs) 106 lb 4.2 oz 105 lb 6.1 oz 105 lb  Weight (kg) 48.2 kg 47.8 kg 47.628 kg      Telemetry    Sinus with pacs- Personally Reviewed   Physical Exam   GEN: No acute distress.   Neck: No JVD Cardiac: RRR, 3/6 systolic murmur Respiratory: Clear to auscultation bilaterally. GI: Soft, nontender, non-distended  MS: No edema Neuro:  Nonfocal  Psych: Normal affect   Labs    High Sensitivity Troponin:   Recent Labs  Lab 12/12/22 2306  12/13/22 0109  TROPONINIHS 32* 28*     Chemistry Recent Labs  Lab 12/12/22 2306 12/13/22 0633 12/14/22 0427 12/14/22 1348 12/15/22 0438  NA  --  139 143  --  139  K  --  4.2 4.1  --  3.8  CL  --  110 112*  --  108  CO2  --  20* 21*  --  22  GLUCOSE  --  174* 134*  --  125*  BUN  --  29* 27*  --  20  CREATININE 1.40* 1.09* 1.00  --  0.93  CALCIUM  --  8.5* 8.2*  --  8.1*  MG  --   --   --  2.2  --   PROT 6.5  --   --   --   --   ALBUMIN 3.6  --   --   --   --   AST 25  --   --   --   --   ALT 17  --   --   --   --   ALKPHOS 51  --   --   --   --  BILITOT 0.3  --   --   --   --   GFRNONAA 38* 51* 56*  --  >60  ANIONGAP  --  9 10  --  9     Hematology Recent Labs  Lab 12/12/22 1845 12/13/22 0633 12/14/22 0427  WBC 13.0* 7.1 15.9*  RBC 2.40*  2.36* 2.70* 2.87*  HGB 7.5* 8.4* 9.0*  HCT 25.4* 27.7* 30.1*  MCV 105.8* 102.6* 104.9*  MCH 31.3 31.1 31.4  MCHC 29.5* 30.3 29.9*  RDW 16.1* 16.8* 17.6*  PLT 301 215 235    BNP Recent Labs  Lab 12/12/22 1453  BNP 1,067.4*      Radiology    DG CHEST PORT 1 VIEW  Result Date: 12/14/2022 CLINICAL DATA:  Shortness of breath EXAM: PORTABLE CHEST 1 VIEW COMPARISON:  12/12/2022 FINDINGS: Enlarged cardiopericardial silhouette. Calcified aorta. Decreasing interstitial edema with some residual. Small left effusion. No pneumothorax or consolidation overlapping cardiac leads. Film is rotated to the left. IMPRESSION: Decreasing edema with some residual. Electronically Signed   By: Karen Kays M.D.   On: 12/14/2022 10:59   ECHOCARDIOGRAM COMPLETE  Result Date: 12/13/2022    ECHOCARDIOGRAM REPORT   Patient Name:   Kelly [name] Date of Exam: 12/13/2022 Medical Rec #:  [id number]      Height:       62.0 in Accession #:    [id number]     Weight:       105.4 lb Date of Birth:  May 09, 1940      BSA:          1.456 m Patient Age:    82 years       BP:           138/68 mmHg Patient Gender: F              HR:           99 bpm. Exam  Location:  Inpatient Procedure: 2D Echo, Color Doppler and Cardiac Doppler Indications:    CHF-Acute Diastolic I50.31  History:        Patient has prior history of Echocardiogram examinations, most                 recent 02/28/2021. CHF, COPD; Signs/Symptoms:Murmur.  Sonographer:    Darlys Gales Referring Phys: [id number] [facility] GOEL IMPRESSIONS  1. There is a severe dynamic LVOT obstruction from concentric hypertrophy and mitral valve SAM. Peak gradient with valsalva. Left ventricular ejection fraction, by estimation, is 65 to 70%. The left ventricle has normal function. The left ventricle has no regional wall motion abnormalities. There is severe concentric left ventricular hypertrophy. Left ventricular diastolic parameters are consistent with Grade III diastolic dysfunction (restrictive). Elevated left atrial pressure.  2. Right ventricular systolic function is normal. The right ventricular size is normal.  3. Left atrial size was severely dilated.  4. The mitral valve is normal in structure. Moderate mitral valve regurgitation.  5. The aortic valve is normal in structure. Aortic valve regurgitation is not visualized. FINDINGS  Left Ventricle: There is a severe dynamic LVOT obstruction from concentric hypertrophy and mitral valve SAM. Peak gradient with valsalva. Left ventricular ejection fraction, by estimation, is 65 to 70%. The left ventricle has normal function. The left ventricle has no regional wall motion abnormalities. The left ventricular internal cavity size was small. There is severe concentric left ventricular hypertrophy. Left ventricular diastolic parameters are consistent with Grade III diastolic dysfunction (restrictive). Elevated left atrial pressure. Right Ventricle:  The right ventricular size is normal. No increase in right ventricular wall thickness. Right ventricular systolic function is normal. Left Atrium: Left atrial size was severely dilated. Right Atrium: Right atrial size was  normal in size. Pericardium: Trivial pericardial effusion is present. Mitral Valve: The mitral valve is normal in structure. There is mild thickening of the mitral valve leaflet(s). Mild mitral annular calcification. Moderate mitral valve regurgitation. Tricuspid Valve: The tricuspid valve is normal in structure. Tricuspid valve regurgitation is mild. Aortic Valve: The aortic valve is normal in structure. Aortic valve regurgitation is not visualized. Pulmonic Valve: The pulmonic valve was grossly normal. Pulmonic valve regurgitation is not visualized. Aorta: The aortic root and ascending aorta are structurally normal, with no evidence of dilitation. IAS/Shunts: No atrial level shunt detected by color flow Doppler.  LEFT VENTRICLE PLAX 2D LVIDd:         3.50 cm   Diastology LVIDs:         2.00 cm   LV e' medial:    6.74 cm/s LV PW:         1.40 cm   LV E/e' medial:  37.8 LV IVS:        1.80 cm   LV e' lateral:   13.10 cm/s LVOT diam:     1.80 cm   LV E/e' lateral: 19.5 LVOT Area:     2.54 cm  LEFT ATRIUM             Index        RIGHT ATRIUM           Index LA Vol (A2C):   88.5 ml 60.78 ml/m  RA Area:     12.60 cm LA Vol (A4C):   42.4 ml 29.12 ml/m  RA Volume:   23.50 ml  16.14 ml/m LA Biplane Vol: 65.4 ml 44.92 ml/m   AORTA Ao Root diam: 3.30 cm Ao Asc diam:  3.50 cm MITRAL VALVE MV Area (PHT): 3.54 cm     SHUNTS MV Decel Time: 214 msec     Systemic Diam: 1.80 cm MV E velocity: 255.00 cm/s Clearnce Hasten Electronically signed by Clearnce Hasten Signature Date/Time: 12/13/2022/9:44:34 AM    Final      Patient Profile     82 year old female with past medical history of hypertrophic obstructive cardiomyopathy, paroxysmal atrial fibrillation, hypertension, hyperlipidemia, COPD, alcohol abuse, history of GI bleed admitted with possible pneumonia for evaluation of hypertrophic cardiomyopathy and possible acute diastolic congestive heart failure. Chest CT showed probable pulmonary edema with possible right  lower lobe pneumonia, groundglass opacities in the lungs and follow-up CT recommended 3 months as well as pulmonary nodule. Echocardiogram showed normal LV function, severe asymmetric septal hypertrophy, grade 3 diastolic dysfunction, systolic anterior motion of the mitral valve with peak gradient 219 mmHg, severe left atrial enlargement, moderate mitral regurgitation.   Assessment & Plan    1 hypertrophic obstructive cardiomyopathy-patient presented with acute on chronic diastolic congestive heart failure.  She is also being treated for pneumonia.  Her symptoms are improving but remains mildly dyspneic.  Will give another 20 mg of Lasix today.  Continue Cardizem at preadmission dose and I have increase Toprol to 50 mg daily.  Titrate further as tolerated and she will follow-up in hypertrophic cardiomyopathy clinic after discharge.  Likely can be discharged tomorrow if symptoms continue to improve.   2 pneumonia-antibiotics per primary care.   3 history of paroxysmal atrial fibrillation-she is in sinus rhythm.  Will continue Cardizem and metoprolol  for rate control if atrial fibrillation recurs.  Continue apixaban.   4 anemia-significantly anemic at time of admission which could have contributed to some of CHF symptoms.  Hemoglobin now improved.  Further evaluation per primary care.  Would like to keep hemoglobin 9 or greater.   5 hyperlipidemia-continue present regimen.  For questions or updates, please contact [facility] Please consult [email or web address] for contact info under        Signed, Olga Millers, MD  12/15/2022, 6:51 AM

## 2022-12-15 NOTE — Progress Notes (Signed)
   12/15/22 2102  BiPAP/CPAP/SIPAP  Reason BIPAP/CPAP not in use Other(comment) (No need of bipap at this time. No resp distress, Pt is currently on 2 L and doing well.)

## 2022-12-15 NOTE — Progress Notes (Signed)
Mobility Specialist - Progress Note   12/15/22 1423  Mobility  Activity Ambulated independently in hallway  Level of Assistance Independent  Assistive Device None  Distance Ambulated (ft) 200 ft  Activity Response Tolerated well  Mobility Referral Yes  $Mobility charge 1 Mobility  Mobility Specialist Start Time (ACUTE ONLY) 1409  Mobility Specialist Stop Time (ACUTE ONLY) 1417  Mobility Specialist Time Calculation (min) (ACUTE ONLY) 8 min   Pt received in bed and agreeable to mobility. No complaints during session. Pt to EOB after session with all needs met.    [facility]

## 2022-12-15 NOTE — TOC CM/SW Note (Signed)
Transition of Care [facility]) - Inpatient Brief Assessment   Patient Details  Name: Kelly [name] MRN: [id number] Date of Birth: 1940/10/18  [facility] CM/SW Contact:    Darleene Cleaver, LCSW Phone Number: 12/15/2022, 2:15 PM   Clinical Narrative:  No anticipated TOC needs at this time.  Patient does have insurance and has a PCP.  Patient does not have any SDOH triggers.  Transition of Care Asessment: Insurance and Status: Insurance coverage has been reviewed Patient has primary care physician: Yes Home environment has been reviewed: Yes Prior level of function:: Indep Prior/Current Home Services: No current home services Social Determinants of Health Reivew: SDOH reviewed no interventions necessary Readmission risk has been reviewed: Yes Transition of care needs: no transition of care needs at this time

## 2022-12-15 NOTE — Progress Notes (Signed)
PROGRESS NOTE    Kelly [name]  ZOX:[id number] DOB: 1940-06-24 DOA: 12/12/2022 PCP: Trisha Mangle, FNP   Brief Narrative: 82 year old with past medical history significant for COPD, heart failure presents with shortness of breath found to have pneumonia respiratory failure, also in the differential heart failure   Assessment & Plan:   Principal Problem:   Hypoxia Active Problems:   Anemia, iron deficiency   Severe concentric left ventricular hypertrophy   Paroxysmal atrial fibrillation (HCC)   AKI (acute kidney injury) (HCC)   CAP (community acquired pneumonia)  1-Acute hypoxic respiratory failure Community-acquired pneumonia Interstitial pulmonary edema -Worsening dyspnea this am on exertion. Chest x ray improved edema.  -Spike fever 11/27. Change ceftriaxone to cefepime./  -Repeat blood culture: No growth to date.  -Cardiology consulted for HF -continue nebulizer.  -IV lasix   AKI: Resolved.  Cr down to 1.0 form 1.4  COPD:Continue with Incruse, Breo. Albuterol.   Paroxysmal A-fib: Continue with Cardizem and eliquis.   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 Cardiology consulted.  -received IV lasix during this hospitalization.  Plan to repeat IV lasix today   Blood culture positive for Staph Hominis.  Repeat Blood culture. No growth to date.   Iron deficiency anemia: Continue with ferrous sulfate.   Hyperlipidemia: On Crestor.   Depression: Continue with remeron.   Groundglass opacity: Needs follow up CT chest     Estimated body mass index is 19.44 kg/m as calculated from the following:   Height as of this encounter: 5\' 2"  (1.575 m).   Weight as of this encounter: 48.2 kg.   DVT prophylaxis: Eliquis Code Status: full code Family Communication: Care discussed with patient.  Disposition Plan:  Status is: Inpatient Remains inpatient appropriate because: management of  PNA    Consultants:  Cardiology   Procedures:  ECHO  Antimicrobials:  Azithromycin Cefepime.   Subjective: She is breathing better today. Cough improved.   Objective: Vitals:   12/14/22 2017 12/14/22 2110 12/15/22 0500 12/15/22 0509  BP: 123/72   (!) 132/55  Pulse: 77   92  Resp: 17   20  Temp: 98.6 F (37 C)   98.9 F (37.2 C)  TempSrc: Oral   Oral  SpO2: 94% 95%  92%  Weight:   48.2 kg   Height:        Intake/Output Summary (Last 24 hours) at 12/15/2022 0742 Last data filed at 12/15/2022 0706 Gross per 24 hour  Intake 2197.89 ml  Output 1400 ml  Net 797.89 ml   Filed Weights   12/12/22 1903 12/12/22 2200 12/15/22 0500  Weight: 47.6 kg 47.8 kg 48.2 kg    Examination:  General exam: NAD Respiratory system: Less BL crackles.  Cardiovascular system: S 1, S 2 RRR Gastrointestinal system: BS present, soft, nt Central nervous system: non focal.  Extremities: no edema    Data Reviewed: I have personally reviewed following labs and imaging studies  CBC: Recent Labs  Lab 12/12/22 1453 12/12/22 1845 12/13/22 0633 12/14/22 0427  WBC 13.3* 13.0* 7.1 15.9*  NEUTROABS 12.0*  --   --   --   HGB 7.7* 7.5* 8.4* 9.0*  HCT 25.2* 25.4* 27.7* 30.1*  MCV 103.7* 105.8* 102.6* 104.9*  PLT 280 301 215 235   Basic Metabolic Panel: Recent Labs  Lab 12/12/22 1453 12/12/22 2306 12/13/22 0633 12/14/22 0427 12/14/22 1348 12/15/22 0438  NA 136  --  139 143  --  139  K 3.8  --  4.2 4.1  --  3.8  CL 105  --  110 112*  --  108  CO2 20*  --  20* 21*  --  22  GLUCOSE 225*  --  174* 134*  --  125*  BUN 25*  --  29* 27*  --  20  CREATININE 1.39* 1.40* 1.09* 1.00  --  0.93  CALCIUM 8.9  --  8.5* 8.2*  --  8.1*  MG  --   --   --   --  2.2  --    GFR: Estimated Creatinine Clearance: 35.5 mL/min (by C-G formula based on SCr of 0.93 mg/dL). Liver Function Tests: Recent Labs  Lab 12/12/22 2306  AST 25  ALT 17  ALKPHOS 51  BILITOT 0.3  PROT 6.5  ALBUMIN 3.6    No results for input(s): "LIPASE", "AMYLASE" in the last 168 hours. No results for input(s): "AMMONIA" in the last 168 hours. Coagulation Profile: Recent Labs  Lab 12/13/22 0633  INR 1.3*   Cardiac Enzymes: No results for input(s): "CKTOTAL", "CKMB", "CKMBINDEX", "TROPONINI" in the last 168 hours. BNP (last 3 results) No results for input(s): "PROBNP" in the last 8760 hours. HbA1C: No results for input(s): "HGBA1C" in the last 72 hours. CBG: No results for input(s): "GLUCAP" in the last 168 hours. Lipid Profile: No results for input(s): "CHOL", "HDL", "LDLCALC", "TRIG", "CHOLHDL", "LDLDIRECT" in the last 72 hours. Thyroid Function Tests: No results for input(s): "TSH", "T4TOTAL", "FREET4", "T3FREE", "THYROIDAB" in the last 72 hours. Anemia Panel: Recent Labs    12/12/22 1845  VITAMINB12 290  FOLATE 44.8  FERRITIN 54  TIBC 486*  IRON 24*  RETICCTPCT 5.3*   Sepsis Labs: No results for input(s): "PROCALCITON", "LATICACIDVEN" in the last 168 hours.  Recent Results (from the past 240 hour(s))  Resp panel by RT-PCR (RSV, Flu A&B, Covid) Anterior Nasal Swab     Status: None   Collection Time: 12/12/22  2:58 PM   Specimen: Anterior Nasal Swab  Result Value Ref Range Status   SARS Coronavirus 2 by RT PCR NEGATIVE NEGATIVE Final    Comment: (NOTE) SARS-CoV-2 target nucleic acids are NOT DETECTED.  The SARS-CoV-2 RNA is generally detectable in upper respiratory specimens during the acute phase of infection. The lowest concentration of SARS-CoV-2 viral copies this assay can detect is 138 copies/mL. A negative result does not preclude SARS-Cov-2 infection and should not be used as the sole basis for treatment or other patient management decisions. A negative result may occur with  improper specimen collection/handling, submission of specimen other than nasopharyngeal swab, presence of viral mutation(s) within the areas targeted by this assay, and inadequate number of  viral copies(<138 copies/mL). A negative result must be combined with clinical observations, patient history, and epidemiological information. The expected result is Negative.  Fact Sheet for Patients:  BloggerCourse.com  Fact Sheet for Healthcare Providers:  SeriousBroker.it  This test is no t yet approved or cleared by the [place] FDA and  has been authorized for detection and/or diagnosis of SARS-CoV-2 by FDA under an Emergency Use Authorization (EUA). This EUA will remain  in effect (meaning this test can be used) for the duration of the COVID-19 declaration under Section 564(b)(1) of the Act, 21 U.S.C.section 360bbb-3(b)(1), unless the authorization is terminated  or revoked sooner.       Influenza A by PCR NEGATIVE NEGATIVE Final   Influenza B by PCR NEGATIVE NEGATIVE Final    Comment: (NOTE) The Xpert Xpress SARS-CoV-2/FLU/RSV plus  assay is intended as an aid in the diagnosis of influenza from Nasopharyngeal swab specimens and should not be used as a sole basis for treatment. Nasal washings and aspirates are unacceptable for Xpert Xpress SARS-CoV-2/FLU/RSV testing.  Fact Sheet for Patients: BloggerCourse.com  Fact Sheet for Healthcare Providers: SeriousBroker.it  This test is not yet approved or cleared by the [place] FDA and has been authorized for detection and/or diagnosis of SARS-CoV-2 by FDA under an Emergency Use Authorization (EUA). This EUA will remain in effect (meaning this test can be used) for the duration of the COVID-19 declaration under Section 564(b)(1) of the Act, 21 U.S.C. section 360bbb-3(b)(1), unless the authorization is terminated or revoked.     Resp Syncytial Virus by PCR NEGATIVE NEGATIVE Final    Comment: (NOTE) Fact Sheet for Patients: BloggerCourse.com  Fact Sheet for Healthcare  Providers: SeriousBroker.it  This test is not yet approved or cleared by the [place] FDA and has been authorized for detection and/or diagnosis of SARS-CoV-2 by FDA under an Emergency Use Authorization (EUA). This EUA will remain in effect (meaning this test can be used) for the duration of the COVID-19 declaration under Section 564(b)(1) of the Act, 21 U.S.C. section 360bbb-3(b)(1), unless the authorization is terminated or revoked.  Performed at [facility], [address] [place], [place] [address]   Culture, blood (Routine X 2) w Reflex to ID Panel     Status: Abnormal   Collection Time: 12/12/22  8:09 PM   Specimen: BLOOD LEFT FOREARM  Result Value Ref Range Status   Specimen Description   Final    BLOOD LEFT FOREARM Performed at [facility], [address] [place], [place] [address]    Special Requests   Final    BOTTLES DRAWN AEROBIC AND ANAEROBIC Blood Culture adequate volume Performed at [facility], [address], [place], [place] [address]    Culture  Setup Time   Final    GRAM POSITIVE COCCI ANAEROBIC BOTTLE ONLY CRITICAL RESULT CALLED TO, READ BACK BY AND VERIFIED WITH: PHARMD D. WOFFORD [id number] @ 1807 FH    Culture (A)  Final    STAPHYLOCOCCUS HOMINIS THE SIGNIFICANCE OF ISOLATING THIS ORGANISM FROM A SINGLE SET OF BLOOD CULTURES WHEN MULTIPLE SETS ARE DRAWN IS UNCERTAIN. PLEASE NOTIFY THE MICROBIOLOGY DEPARTMENT WITHIN ONE WEEK IF SPECIATION AND SENSITIVITIES ARE REQUIRED. Performed at [facility], [address], [place], [place] [address]    Report Status 12/14/2022 FINAL  Final  Blood Culture ID Panel (Reflexed)     Status: Abnormal   Collection Time: 12/12/22  8:09 PM  Result Value Ref Range Status   Enterococcus faecalis NOT DETECTED NOT DETECTED Final   Enterococcus Faecium NOT DETECTED NOT DETECTED Final   Listeria monocytogenes NOT DETECTED NOT DETECTED Final    Staphylococcus species DETECTED (A) NOT DETECTED Final    Comment: CRITICAL RESULT CALLED TO, READ BACK BY AND VERIFIED WITH: PHARMD D. HQIONGE [id number] @ 1807 FH    Staphylococcus aureus (BCID) NOT DETECTED NOT DETECTED Final   Staphylococcus epidermidis NOT DETECTED NOT DETECTED Final   Staphylococcus lugdunensis NOT DETECTED NOT DETECTED Final   Streptococcus species NOT DETECTED NOT DETECTED Final   Streptococcus agalactiae NOT DETECTED NOT DETECTED Final   Streptococcus pneumoniae NOT DETECTED NOT DETECTED Final   Streptococcus pyogenes NOT DETECTED NOT DETECTED Final   A.calcoaceticus-baumannii NOT DETECTED NOT DETECTED Final   Bacteroides fragilis NOT DETECTED NOT DETECTED Final   Enterobacterales  NOT DETECTED NOT DETECTED Final   Enterobacter cloacae complex NOT DETECTED NOT DETECTED Final   Escherichia coli NOT DETECTED NOT DETECTED Final   Klebsiella aerogenes NOT DETECTED NOT DETECTED Final   Klebsiella oxytoca NOT DETECTED NOT DETECTED Final   Klebsiella pneumoniae NOT DETECTED NOT DETECTED Final   Proteus species NOT DETECTED NOT DETECTED Final   Salmonella species NOT DETECTED NOT DETECTED Final   Serratia marcescens NOT DETECTED NOT DETECTED Final   Haemophilus influenzae NOT DETECTED NOT DETECTED Final   Neisseria meningitidis NOT DETECTED NOT DETECTED Final   Pseudomonas aeruginosa NOT DETECTED NOT DETECTED Final   Stenotrophomonas maltophilia NOT DETECTED NOT DETECTED Final   Candida albicans NOT DETECTED NOT DETECTED Final   Candida auris NOT DETECTED NOT DETECTED Final   Candida glabrata NOT DETECTED NOT DETECTED Final   Candida krusei NOT DETECTED NOT DETECTED Final   Candida parapsilosis NOT DETECTED NOT DETECTED Final   Candida tropicalis NOT DETECTED NOT DETECTED Final   Cryptococcus neoformans/gattii NOT DETECTED NOT DETECTED Final    Comment: Performed at [facility], [address], [place], [place] [address]  Culture, blood (Routine X 2) w Reflex to  ID Panel     Status: None (Preliminary result)   Collection Time: 12/12/22  8:20 PM   Specimen: BLOOD  Result Value Ref Range Status   Specimen Description   Final    BLOOD BLOOD LEFT HAND Performed at [facility], [address], [place], [place] [address]    Special Requests   Final    BOTTLES DRAWN AEROBIC AND ANAEROBIC Blood Culture results may not be optimal due to an inadequate volume of blood received in culture bottles Performed at [facility], [address] [place], [place] [address]    Culture   Final    NO GROWTH 2 DAYS Performed at [facility], [address] [place], [place] [address]    Report Status PENDING  Incomplete  Respiratory (~20 pathogens) panel by PCR     Status: None   Collection Time: 12/13/22  9:15 AM   Specimen: Nasopharyngeal Swab; Respiratory  Result Value Ref Range Status   Adenovirus NOT DETECTED NOT DETECTED Final   Coronavirus 229E NOT DETECTED NOT DETECTED Final    Comment: (NOTE) The Coronavirus on the Respiratory Panel, DOES NOT test for the novel  Coronavirus (2019 nCoV)    Coronavirus HKU1 NOT DETECTED NOT DETECTED Final   Coronavirus NL63 NOT DETECTED NOT DETECTED Final   Coronavirus OC43 NOT DETECTED NOT DETECTED Final   Metapneumovirus NOT DETECTED NOT DETECTED Final   Rhinovirus / Enterovirus NOT DETECTED NOT DETECTED Final   Influenza A NOT DETECTED NOT DETECTED Final   Influenza B NOT DETECTED NOT DETECTED Final   Parainfluenza Virus 1 NOT DETECTED NOT DETECTED Final   Parainfluenza Virus 2 NOT DETECTED NOT DETECTED Final   Parainfluenza Virus 3 NOT DETECTED NOT DETECTED Final   Parainfluenza Virus 4 NOT DETECTED NOT DETECTED Final   Respiratory Syncytial Virus NOT DETECTED NOT DETECTED Final   Bordetella pertussis NOT DETECTED NOT DETECTED Final   Bordetella Parapertussis NOT DETECTED NOT DETECTED Final   Chlamydophila pneumoniae NOT DETECTED NOT DETECTED Final   Mycoplasma  pneumoniae NOT DETECTED NOT DETECTED Final    Comment: Performed at [facility], [address] [place], [place] [address]         Radiology Studies: DG CHEST PORT 1 VIEW  Result Date: 12/14/2022 CLINICAL  DATA:  Shortness of breath EXAM: PORTABLE CHEST 1 VIEW COMPARISON:  12/12/2022 FINDINGS: Enlarged cardiopericardial silhouette. Calcified aorta. Decreasing interstitial edema with some residual. Small left effusion. No pneumothorax or consolidation overlapping cardiac leads. Film is rotated to the left. IMPRESSION: Decreasing edema with some residual. Electronically Signed   By: Karen Kays M.D.   On: 12/14/2022 10:59   ECHOCARDIOGRAM COMPLETE  Result Date: 12/13/2022    ECHOCARDIOGRAM REPORT   Patient Name:   Kelly [name] Date of Exam: 12/13/2022 Medical Rec #:  [id number]      Height:       62.0 in Accession #:    [id number]     Weight:       105.4 lb Date of Birth:  [birth year]      BSA:          1.456 m Patient Age:    82 years       BP:           138/68 mmHg Patient Gender: F              HR:           99 bpm. Exam Location:  Inpatient Procedure: 2D Echo, Color Doppler and Cardiac Doppler Indications:    CHF-Acute Diastolic I50.31  History:        Patient has prior history of Echocardiogram examinations, most                 recent 02/28/2021. CHF, COPD; Signs/Symptoms:Murmur.  Sonographer:    Darlys Gales Referring Phys: [id number] [facility] GOEL IMPRESSIONS  1. There is a severe dynamic LVOT obstruction from concentric hypertrophy and mitral valve SAM. Peak gradient with valsalva. Left ventricular ejection fraction, by estimation, is 65 to 70%. The left ventricle has normal function. The left ventricle has no regional wall motion abnormalities. There is severe concentric left ventricular hypertrophy. Left ventricular diastolic parameters are consistent with Grade III diastolic dysfunction (restrictive). Elevated left atrial pressure.  2. Right ventricular systolic function is  normal. The right ventricular size is normal.  3. Left atrial size was severely dilated.  4. The mitral valve is normal in structure. Moderate mitral valve regurgitation.  5. The aortic valve is normal in structure. Aortic valve regurgitation is not visualized. FINDINGS  Left Ventricle: There is a severe dynamic LVOT obstruction from concentric hypertrophy and mitral valve SAM. Peak gradient with valsalva. Left ventricular ejection fraction, by estimation, is 65 to 70%. The left ventricle has normal function. The left ventricle has no regional wall motion abnormalities. The left ventricular internal cavity size was small. There is severe concentric left ventricular hypertrophy. Left ventricular diastolic parameters are consistent with Grade III diastolic dysfunction (restrictive). Elevated left atrial pressure. Right Ventricle: The right ventricular size is normal. No increase in right ventricular wall thickness. Right ventricular systolic function is normal. Left Atrium: Left atrial size was severely dilated. Right Atrium: Right atrial size was normal in size. Pericardium: Trivial pericardial effusion is present. Mitral Valve: The mitral valve is normal in structure. There is mild thickening of the mitral valve leaflet(s). Mild mitral annular calcification. Moderate mitral valve regurgitation. Tricuspid Valve: The tricuspid valve is normal in structure. Tricuspid valve regurgitation is mild. Aortic Valve: The aortic valve is normal in structure. Aortic valve regurgitation is not visualized. Pulmonic Valve: The pulmonic valve was grossly normal. Pulmonic valve regurgitation is not visualized. Aorta: The aortic root and ascending aorta are structurally normal, with no evidence of dilitation. IAS/Shunts: No  atrial level shunt detected by color flow Doppler.  LEFT VENTRICLE PLAX 2D LVIDd:         3.50 cm   Diastology LVIDs:         2.00 cm   LV e' medial:    6.74 cm/s LV PW:         1.40 cm   LV E/e' medial:   37.8 LV IVS:        1.80 cm   LV e' lateral:   13.10 cm/s LVOT diam:     1.80 cm   LV E/e' lateral: 19.5 LVOT Area:     2.54 cm  LEFT ATRIUM             Index        RIGHT ATRIUM           Index LA Vol (A2C):   88.5 ml 60.78 ml/m  RA Area:     12.60 cm LA Vol (A4C):   42.4 ml 29.12 ml/m  RA Volume:   23.50 ml  16.14 ml/m LA Biplane Vol: 65.4 ml 44.92 ml/m   AORTA Ao Root diam: 3.30 cm Ao Asc diam:  3.50 cm MITRAL VALVE MV Area (PHT): 3.54 cm     SHUNTS MV Decel Time: 214 msec     Systemic Diam: 1.80 cm MV E velocity: 255.00 cm/s Clearnce Hasten Electronically signed by Clearnce Hasten Signature Date/Time: 12/13/2022/9:44:34 AM    Final         Scheduled Meds:  albuterol  2.5 mg Nebulization TID   apixaban  2.5 mg Oral BID   diltiazem  240 mg Oral Daily   DULoxetine  60 mg Oral Daily   ezetimibe  10 mg Oral Daily   ferrous sulfate  325 mg Oral Q breakfast   fluticasone furoate-vilanterol  1 puff Inhalation Daily   And   umeclidinium bromide  1 puff Inhalation Daily   furosemide  20 mg Intravenous Once   metoprolol succinate  50 mg Oral Daily   mirtazapine  7.5 mg Oral QHS   nicotine  14 mg Transdermal Daily   potassium chloride  40 mEq Oral Once   pregabalin  75 mg Oral TID   rosuvastatin  40 mg Oral Daily   sodium chloride flush  3 mL Intravenous Q12H   Continuous Infusions:  ceFEPime (MAXIPIME) IV 2 g (12/15/22 0121)     LOS: 3 days    Time spent: 35 minutes    Iriana Artley A Jianni Batten, MD [facility]   If 7PM-7AM, please contact night-coverage [email or web address]  12/15/2022, 7:42 AM

## 2022-12-16 DIAGNOSIS — I5033 Acute on chronic diastolic (congestive) heart failure: Secondary | ICD-10-CM | POA: Diagnosis not present

## 2022-12-16 DIAGNOSIS — R0902 Hypoxemia: Secondary | ICD-10-CM | POA: Diagnosis not present

## 2022-12-16 LAB — BASIC METABOLIC PANEL
Anion gap: 8 (ref 5–15)
BUN: 18 mg/dL (ref 8–23)
CO2: 23 mmol/L (ref 22–32)
Calcium: 8.4 mg/dL — ABNORMAL LOW (ref 8.9–10.3)
Chloride: 110 mmol/L (ref 98–111)
Creatinine, Ser: 0.92 mg/dL (ref 0.44–1.00)
GFR, Estimated: >60 mL/min (ref >60)
Glucose, Bld: 110 mg/dL — ABNORMAL HIGH (ref 70–99)
Potassium: 4 mmol/L (ref 3.5–5.1)
Sodium: 141 mmol/L (ref 135–145)

## 2022-12-16 LAB — TYPE AND SCREEN
ABO/RH(D): O POS
Antibody Screen: NEGATIVE
Unit division: 0

## 2022-12-16 LAB — BPAM RBC
Blood Product Expiration Date: 202412232359
ISSUE DATE / TIME: 202411260155
Unit Type and Rh: 5100

## 2022-12-16 MED ORDER — LEVOFLOXACIN 500 MG PO TABS: 500.0000 mg | ORAL_TABLET | Freq: Every day | ORAL | 0 refills | Status: DC

## 2022-12-16 MED ORDER — FUROSEMIDE 20 MG PO TABS
20.0000 mg | ORAL_TABLET | Freq: Every day | ORAL | Status: DC
  Administered 2022-12-16: 20 mg via ORAL
  Filled 2022-12-16: qty 1

## 2022-12-16 MED ORDER — SODIUM CHLORIDE 0.9 % IV BOLUS
500.0000 mL | Freq: Once | INTRAVENOUS | Status: AC
  Administered 2022-12-16: 500 mL via INTRAVENOUS

## 2022-12-16 MED ORDER — METOPROLOL SUCCINATE ER 50 MG PO TB24: 50.0000 mg | ORAL_TABLET | Freq: Every day | ORAL | 0 refills | Status: DC

## 2022-12-16 MED ORDER — METOPROLOL SUCCINATE ER 25 MG PO TB24: 25.0000 mg | ORAL_TABLET | Freq: Every day | ORAL | Status: DC

## 2022-12-16 MED ORDER — FUROSEMIDE 20 MG PO TABS: 20.0000 mg | ORAL_TABLET | Freq: Every day | ORAL | 0 refills | Status: DC

## 2022-12-16 NOTE — Progress Notes (Signed)
   12/16/22 2058  BiPAP/CPAP/SIPAP  Reason BIPAP/CPAP not in use Other(comment) (no resp distress pt is on 2L [place] no need of bipap at this time)  BiPAP/CPAP /SiPAP Vitals  Resp 18  MEWS Score/Color  MEWS Score 0  MEWS Score Color Green

## 2022-12-16 NOTE — Progress Notes (Signed)
Rounding Note    Patient Name: Kelly [name] Date of Encounter: 12/16/2022  [facility] Cardiologist: Dr Jacinto Halim  Subjective   Denies CP or dyspnea  Inpatient Medications    Scheduled Meds:  albuterol  2.5 mg Nebulization BID   apixaban  2.5 mg Oral BID   diltiazem  240 mg Oral Daily   DULoxetine  60 mg Oral Daily   ezetimibe  10 mg Oral Daily   ferrous sulfate  325 mg Oral Q breakfast   fluticasone furoate-vilanterol  1 puff Inhalation Daily   And   umeclidinium bromide  1 puff Inhalation Daily   metoprolol succinate  50 mg Oral Daily   mirtazapine  7.5 mg Oral QHS   nicotine  14 mg Transdermal Daily   pregabalin  75 mg Oral TID   rosuvastatin  40 mg Oral Daily   sodium chloride flush  3 mL Intravenous Q12H   Continuous Infusions:  ceFEPime (MAXIPIME) IV 2 g (12/16/22 0216)   PRN Meds: acetaminophen **OR** acetaminophen, ipratropium-albuterol, polyethylene glycol   Vital Signs    Vitals:   12/15/22 2100 12/16/22 0500 12/16/22 0522 12/16/22 0620  BP:   119/70   Pulse:   67   Resp:   16   Temp:   98.3 F (36.8 C)   TempSrc:   Oral   SpO2: 98%  97% 96%  Weight:  50.2 kg    Height:        Intake/Output Summary (Last 24 hours) at 12/16/2022 0724 Last data filed at 12/16/2022 0525 Gross per 24 hour  Intake 1300 ml  Output 500 ml  Net 800 ml      12/16/2022    5:00 AM 12/15/2022    5:00 AM 12/12/2022   10:00 PM  Last 3 Weights  Weight (lbs) 110 lb 10.7 oz 106 lb 4.2 oz 105 lb 6.1 oz  Weight (kg) 50.2 kg 48.2 kg 47.8 kg      Telemetry    Sinus with pacs- Personally Reviewed   Physical Exam   GEN: NAD.   Neck: supple Cardiac: RRR, 3/6 systolic murmur, no rub Respiratory: CTA GI: Soft, NT/ND MS: No edema Neuro:  Grossly intact Psych: Normal affect   Labs    High Sensitivity Troponin:   Recent Labs  Lab 12/12/22 2306 12/13/22 0109  TROPONINIHS 32* 28*     Chemistry Recent Labs  Lab 12/12/22 2306 12/13/22 0633  12/14/22 0427 12/14/22 1348 12/15/22 0438 12/16/22 0419  NA  --    < > 143  --  139 141  K  --    < > 4.1  --  3.8 4.0  CL  --    < > 112*  --  108 110  CO2  --    < > 21*  --  22 23  GLUCOSE  --    < > 134*  --  125* 110*  BUN  --    < > 27*  --  20 18  CREATININE 1.40*   < > 1.00  --  0.93 0.92  CALCIUM  --    < > 8.2*  --  8.1* 8.4*  MG  --   --   --  2.2  --   --   PROT 6.5  --   --   --   --   --   ALBUMIN 3.6  --   --   --   --   --   AST  25  --   --   --   --   --   ALT 17  --   --   --   --   --   ALKPHOS 51  --   --   --   --   --   BILITOT 0.3  --   --   --   --   --   GFRNONAA 38*   < > 56*  --  >60 >60  ANIONGAP  --    < > 10  --  9 8   < > = values in this interval not displayed.     Hematology Recent Labs  Lab 12/13/22 [phone] 12/14/22 0427 12/15/22 0741  WBC 7.1 15.9* 9.5  RBC 2.70* 2.87* 2.75*  HGB 8.4* 9.0* 8.7*  HCT 27.7* 30.1* 28.5*  MCV 102.6* 104.9* 103.6*  MCH 31.1 31.4 31.6  MCHC 30.3 29.9* 30.5  RDW 16.8* 17.6* 16.9*  PLT 215 235 219    BNP Recent Labs  Lab 12/12/22 1453  BNP 1,067.4*      Radiology    DG CHEST PORT 1 VIEW  Result Date: 12/14/2022 CLINICAL DATA:  Shortness of breath EXAM: PORTABLE CHEST 1 VIEW COMPARISON:  12/12/2022 FINDINGS: Enlarged cardiopericardial silhouette. Calcified aorta. Decreasing interstitial edema with some residual. Small left effusion. No pneumothorax or consolidation overlapping cardiac leads. Film is rotated to the left. IMPRESSION: Decreasing edema with some residual. Electronically Signed   By: Karen Kays M.D.   On: 12/14/2022 10:59     Patient Profile     82 year old female with past medical history of hypertrophic obstructive cardiomyopathy, paroxysmal atrial fibrillation, hypertension, hyperlipidemia, COPD, alcohol abuse, history of GI bleed admitted with possible pneumonia for evaluation of hypertrophic cardiomyopathy and possible acute diastolic congestive heart failure. Chest CT showed  probable pulmonary edema with possible right lower lobe pneumonia, groundglass opacities in the lungs and follow-up CT recommended 3 months as well as pulmonary nodule. Echocardiogram showed normal LV function, severe asymmetric septal hypertrophy, grade 3 diastolic dysfunction, systolic anterior motion of the mitral valve with peak gradient 219 mmHg, severe left atrial enlargement, moderate mitral regurgitation.   Assessment & Plan    1 hypertrophic obstructive cardiomyopathy-patient presented with acute on chronic diastolic congestive heart failure.  She is also being treated for pneumonia.  Dyspnea has resolved.  Will treat with Lasix 20 mg daily and continue present dose of Cardizem and Toprol.  Medications can be titrated as an outpatient.  Follow-up in hypertrophic cardiomyopathy clinic.  She can be discharged from a cardiac standpoint.     2 pneumonia-antibiotics per primary care.   3 history of paroxysmal atrial fibrillation-she is in sinus rhythm.  Will continue Cardizem and metoprolol for rate control if atrial fibrillation recurs.  Continue apixaban.   4 anemia-significantly anemic at time of admission which could have contributed to some of CHF symptoms.  Hemoglobin now improved.  Further evaluation per primary care.  Would like to keep hemoglobin 9 or greater.   5 hyperlipidemia-continue present regimen.  Patient can be discharged from a cardiac standpoint.  We will sign off.  Please call with questions.  Continue present medications at discharge.  We will arrange follow-up with APP 2 to 4 weeks.  Check potassium and renal function 1 week following discharge.  For questions or updates, please contact [facility] Please consult [email or web address] for contact info under        Signed, Olga Millers, MD  12/16/2022, 7:24  AM

## 2022-12-16 NOTE — Plan of Care (Signed)
  Problem: Safety: Goal: Ability to remain free from injury will improve Outcome: Progressing   Problem: Skin Integrity: Goal: Risk for impaired skin integrity will decrease Outcome: Progressing   

## 2022-12-16 NOTE — Progress Notes (Signed)
PHARMACY - PHYSICIAN COMMUNICATION CRITICAL VALUE ALERT - BLOOD CULTURE IDENTIFICATION (BCID)  Kelly [name] is an 82 y.o. female who presented to [facility] on 12/12/2022 with a chief complaint of pna, respiratory failure  Assessment:  gram positive rods  Name of physician (or Provider) Contacted: Virgel Manifold  Current antibiotics: cefepime  Changes to prescribed antibiotics recommended:  None, probable contaminant  Results for orders placed or performed during the hospital encounter of 12/12/22  Blood Culture ID Panel (Reflexed) (Collected: 12/12/2022  8:09 PM)  Result Value Ref Range   Enterococcus faecalis NOT DETECTED NOT DETECTED   Enterococcus Faecium NOT DETECTED NOT DETECTED   Listeria monocytogenes NOT DETECTED NOT DETECTED   Staphylococcus species DETECTED (A) NOT DETECTED   Staphylococcus aureus (BCID) NOT DETECTED NOT DETECTED   Staphylococcus epidermidis NOT DETECTED NOT DETECTED   Staphylococcus lugdunensis NOT DETECTED NOT DETECTED   Streptococcus species NOT DETECTED NOT DETECTED   Streptococcus agalactiae NOT DETECTED NOT DETECTED   Streptococcus pneumoniae NOT DETECTED NOT DETECTED   Streptococcus pyogenes NOT DETECTED NOT DETECTED   A.calcoaceticus-baumannii NOT DETECTED NOT DETECTED   Bacteroides fragilis NOT DETECTED NOT DETECTED   Enterobacterales NOT DETECTED NOT DETECTED   Enterobacter cloacae complex NOT DETECTED NOT DETECTED   Escherichia coli NOT DETECTED NOT DETECTED   Klebsiella aerogenes NOT DETECTED NOT DETECTED   Klebsiella oxytoca NOT DETECTED NOT DETECTED   Klebsiella pneumoniae NOT DETECTED NOT DETECTED   Proteus species NOT DETECTED NOT DETECTED   Salmonella species NOT DETECTED NOT DETECTED   Serratia marcescens NOT DETECTED NOT DETECTED   Haemophilus influenzae NOT DETECTED NOT DETECTED   Neisseria meningitidis NOT DETECTED NOT DETECTED   Pseudomonas aeruginosa NOT DETECTED NOT DETECTED   Stenotrophomonas maltophilia NOT DETECTED NOT  DETECTED   Candida albicans NOT DETECTED NOT DETECTED   Candida auris NOT DETECTED NOT DETECTED   Candida glabrata NOT DETECTED NOT DETECTED   Candida krusei NOT DETECTED NOT DETECTED   Candida parapsilosis NOT DETECTED NOT DETECTED   Candida tropicalis NOT DETECTED NOT DETECTED   Cryptococcus neoformans/gattii NOT DETECTED NOT DETECTED    Arley Phenix RPh 12/16/2022, 3:12 AM

## 2022-12-16 NOTE — Progress Notes (Signed)
PROGRESS NOTE    Kelly [name]  ZOX:[id number] DOB: 04-Jan-1941 DOA: 12/12/2022 PCP: Trisha Mangle, FNP   Brief Narrative: 82 year old with past medical history significant for COPD, heart failure presents with shortness of breath found to have pneumonia respiratory failure, also in the differential heart failure   Assessment & Plan:   Principal Problem:   Hypoxia Active Problems:   Anemia, iron deficiency   Severe concentric left ventricular hypertrophy   Paroxysmal atrial fibrillation (HCC)   AKI (acute kidney injury) (HCC)   CAP (community acquired pneumonia)  1-Acute hypoxic respiratory failure Community-acquired pneumonia Interstitial pulmonary edema -Chest x ray improved edema.  -Spike fever 11/27. Change ceftriaxone to cefepime./  -Repeat blood culture: No growth to date.  -Cardiology consulted for HF -Continue nebulizer.  -Treated with IV lasix. Change to oral today.   Hypotension:  Patient SBP drops to 84 on vitals.  She was discharge, we call her back due to BP was in the high 80.  She return, she was alert, feels sleepy.  Plan to hold oral lasix. Reduce metoprolol back to 25 mg. Or might have to hold it tomorrow.  Will proceed with 500 cc IV fluid.  Plan to monitor BP overnight.   AKI: Resolved.  Cr down to 1.0 form 1.4  COPD:Continue with Incruse, Breo. Albuterol.   Paroxysmal A-fib: Continue with Cardizem and eliquis.   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 Cardiology consulted.  -received IV lasix during this hospitalization.  Hold lasix. SBP soft.   Blood culture positive for Staph Hominis.  Repeat Blood culture. No growth to date.   Iron deficiency anemia: Continue with ferrous sulfate.   Hyperlipidemia: On Crestor.   Depression: Continue with remeron.   Groundglass opacity: Needs follow up CT chest     Estimated body mass index is 20.24 kg/m as calculated  from the following:   Height as of this encounter: 5\' 2"  (1.575 m).   Weight as of this encounter: 50.2 kg.   DVT prophylaxis: Eliquis Code Status: full code Family Communication: Care discussed with patient.  Disposition Plan:  Status is: Inpatient Remains inpatient appropriate because: management of PNA    Consultants:  Cardiology   Procedures:  ECHO  Antimicrobials:  Azithromycin Cefepime.   Subjective: She is breathing better.  She was ready for discharge , but her BP was low. We call her back.   Objective: Vitals:   12/16/22 0522 12/16/22 0620 12/16/22 0756 12/16/22 1402  BP: 119/70   (!) 84/42  Pulse: 67   67  Resp: 16  17   Temp: 98.3 F (36.8 C)   99.9 F (37.7 C)  TempSrc: Oral   Oral  SpO2: 97% 96%  91%  Weight:      Height:        Intake/Output Summary (Last 24 hours) at 12/16/2022 1518 Last data filed at 12/16/2022 1400 Gross per 24 hour  Intake 1130 ml  Output 350 ml  Net 780 ml   Filed Weights   12/12/22 2200 12/15/22 0500 12/16/22 0500  Weight: 47.8 kg 48.2 kg 50.2 kg    Examination:  General exam: NAD Respiratory system: CTA Cardiovascular system: S 1, S 2 RRR Gastrointestinal system: BS present, soft nt Central nervous system: Non focal.  Extremities: no edema    Data Reviewed: I have personally reviewed following labs and imaging studies  CBC: Recent Labs  Lab 12/12/22 1453 12/12/22 1845 12/13/22 0633 12/14/22 0427 12/15/22 0741  WBC  13.3* 13.0* 7.1 15.9* 9.5  NEUTROABS 12.0*  --   --   --   --   HGB 7.7* 7.5* 8.4* 9.0* 8.7*  HCT 25.2* 25.4* 27.7* 30.1* 28.5*  MCV 103.7* 105.8* 102.6* 104.9* 103.6*  PLT 280 301 215 235 219   Basic Metabolic Panel: Recent Labs  Lab 12/12/22 1453 12/12/22 2306 12/13/22 0633 12/14/22 0427 12/14/22 1348 12/15/22 0438 12/16/22 0419  NA 136  --  139 143  --  139 141  K 3.8  --  4.2 4.1  --  3.8 4.0  CL 105  --  110 112*  --  108 110  CO2 20*  --  20* 21*  --  22 23  GLUCOSE  225*  --  174* 134*  --  125* 110*  BUN 25*  --  29* 27*  --  20 18  CREATININE 1.39* 1.40* 1.09* 1.00  --  0.93 0.92  CALCIUM 8.9  --  8.5* 8.2*  --  8.1* 8.4*  MG  --   --   --   --  2.2  --   --    GFR: Estimated Creatinine Clearance: 37.3 mL/min (by C-G formula based on SCr of 0.92 mg/dL). Liver Function Tests: Recent Labs  Lab 12/12/22 2306  AST 25  ALT 17  ALKPHOS 51  BILITOT 0.3  PROT 6.5  ALBUMIN 3.6   No results for input(s): "LIPASE", "AMYLASE" in the last 168 hours. No results for input(s): "AMMONIA" in the last 168 hours. Coagulation Profile: Recent Labs  Lab 12/13/22 0633  INR 1.3*   Cardiac Enzymes: No results for input(s): "CKTOTAL", "CKMB", "CKMBINDEX", "TROPONINI" in the last 168 hours. BNP (last 3 results) No results for input(s): "PROBNP" in the last 8760 hours. HbA1C: No results for input(s): "HGBA1C" in the last 72 hours. CBG: No results for input(s): "GLUCAP" in the last 168 hours. Lipid Profile: No results for input(s): "CHOL", "HDL", "LDLCALC", "TRIG", "CHOLHDL", "LDLDIRECT" in the last 72 hours. Thyroid Function Tests: No results for input(s): "TSH", "T4TOTAL", "FREET4", "T3FREE", "THYROIDAB" in the last 72 hours. Anemia Panel: No results for input(s): "VITAMINB12", "FOLATE", "FERRITIN", "TIBC", "IRON", "RETICCTPCT" in the last 72 hours.  Sepsis Labs: No results for input(s): "PROCALCITON", "LATICACIDVEN" in the last 168 hours.  Recent Results (from the past 240 hour(s))  Resp panel by RT-PCR (RSV, Flu A&B, Covid) Anterior Nasal Swab     Status: None   Collection Time: 12/12/22  2:58 PM   Specimen: Anterior Nasal Swab  Result Value Ref Range Status   SARS Coronavirus 2 by RT PCR NEGATIVE NEGATIVE Final    Comment: (NOTE) SARS-CoV-2 target nucleic acids are NOT DETECTED.  The SARS-CoV-2 RNA is generally detectable in upper respiratory specimens during the acute phase of infection. The lowest concentration of SARS-CoV-2 viral copies this  assay can detect is 138 copies/mL. A negative result does not preclude SARS-Cov-2 infection and should not be used as the sole basis for treatment or other patient management decisions. A negative result may occur with  improper specimen collection/handling, submission of specimen other than nasopharyngeal swab, presence of viral mutation(s) within the areas targeted by this assay, and inadequate number of viral copies(<138 copies/mL). A negative result must be combined with clinical observations, patient history, and epidemiological information. The expected result is Negative.  Fact Sheet for Patients:  BloggerCourse.com  Fact Sheet for Healthcare Providers:  SeriousBroker.it  This test is no t yet approved or cleared by the [place] FDA  and  has been authorized for detection and/or diagnosis of SARS-CoV-2 by FDA under an Emergency Use Authorization (EUA). This EUA will remain  in effect (meaning this test can be used) for the duration of the COVID-19 declaration under Section 564(b)(1) of the Act, 21 U.S.C.section 360bbb-3(b)(1), unless the authorization is terminated  or revoked sooner.       Influenza A by PCR NEGATIVE NEGATIVE Final   Influenza B by PCR NEGATIVE NEGATIVE Final    Comment: (NOTE) The Xpert Xpress SARS-CoV-2/FLU/RSV plus assay is intended as an aid in the diagnosis of influenza from Nasopharyngeal swab specimens and should not be used as a sole basis for treatment. Nasal washings and aspirates are unacceptable for Xpert Xpress SARS-CoV-2/FLU/RSV testing.  Fact Sheet for Patients: BloggerCourse.com  Fact Sheet for Healthcare Providers: SeriousBroker.it  This test is not yet approved or cleared by the [place] FDA and has been authorized for detection and/or diagnosis of SARS-CoV-2 by FDA under an Emergency Use Authorization (EUA). This EUA will  remain in effect (meaning this test can be used) for the duration of the COVID-19 declaration under Section 564(b)(1) of the Act, 21 U.S.C. section 360bbb-3(b)(1), unless the authorization is terminated or revoked.     Resp Syncytial Virus by PCR NEGATIVE NEGATIVE Final    Comment: (NOTE) Fact Sheet for Patients: BloggerCourse.com  Fact Sheet for Healthcare Providers: SeriousBroker.it  This test is not yet approved or cleared by the [place] FDA and has been authorized for detection and/or diagnosis of SARS-CoV-2 by FDA under an Emergency Use Authorization (EUA). This EUA will remain in effect (meaning this test can be used) for the duration of the COVID-19 declaration under Section 564(b)(1) of the Act, 21 U.S.C. section 360bbb-3(b)(1), unless the authorization is terminated or revoked.  Performed at [facility], [address] [place], [place] [address]   Culture, blood (Routine X 2) w Reflex to ID Panel     Status: Abnormal   Collection Time: 12/12/22  8:09 PM   Specimen: BLOOD LEFT FOREARM  Result Value Ref Range Status   Specimen Description   Final    BLOOD LEFT FOREARM Performed at [facility], [address] [place], [place] [address]    Special Requests   Final    BOTTLES DRAWN AEROBIC AND ANAEROBIC Blood Culture adequate volume Performed at [facility], [address], [place], [place] [address]    Culture  Setup Time   Final    GRAM POSITIVE COCCI ANAEROBIC BOTTLE ONLY CRITICAL RESULT CALLED TO, READ BACK BY AND VERIFIED WITH: PHARMD D. WOFFORD [id number] @ 1807 FH GRAM POSITIVE RODS AEROBIC BOTTLE ONLY CRITICAL RESULT CALLED TO, READ BACK BY AND VERIFIED WITH: PHARMD E. JACKSON 12/16/22 @ 0304 BY AB    Culture (A)  Final    STAPHYLOCOCCUS HOMINIS THE SIGNIFICANCE OF ISOLATING THIS ORGANISM FROM A SINGLE SET OF BLOOD CULTURES WHEN MULTIPLE SETS ARE DRAWN IS  UNCERTAIN. PLEASE NOTIFY THE MICROBIOLOGY DEPARTMENT WITHIN ONE WEEK IF SPECIATION AND SENSITIVITIES ARE REQUIRED. Performed at [facility], [address], [place], [place] [address]    Report Status 12/14/2022 FINAL  Final  Blood Culture ID Panel (Reflexed)     Status: Abnormal   Collection Time: 12/12/22  8:09 PM  Result Value Ref Range Status   Enterococcus faecalis NOT DETECTED NOT DETECTED Final   Enterococcus Faecium NOT DETECTED NOT DETECTED Final   Listeria monocytogenes NOT DETECTED NOT DETECTED Final  Staphylococcus species DETECTED (A) NOT DETECTED Final    Comment: CRITICAL RESULT CALLED TO, READ BACK BY AND VERIFIED WITH: PHARMD D. ZOXWRUE [id number] @ 1807 FH    Staphylococcus aureus (BCID) NOT DETECTED NOT DETECTED Final   Staphylococcus epidermidis NOT DETECTED NOT DETECTED Final   Staphylococcus lugdunensis NOT DETECTED NOT DETECTED Final   Streptococcus species NOT DETECTED NOT DETECTED Final   Streptococcus agalactiae NOT DETECTED NOT DETECTED Final   Streptococcus pneumoniae NOT DETECTED NOT DETECTED Final   Streptococcus pyogenes NOT DETECTED NOT DETECTED Final   A.calcoaceticus-baumannii NOT DETECTED NOT DETECTED Final   Bacteroides fragilis NOT DETECTED NOT DETECTED Final   Enterobacterales NOT DETECTED NOT DETECTED Final   Enterobacter cloacae complex NOT DETECTED NOT DETECTED Final   Escherichia coli NOT DETECTED NOT DETECTED Final   Klebsiella aerogenes NOT DETECTED NOT DETECTED Final   Klebsiella oxytoca NOT DETECTED NOT DETECTED Final   Klebsiella pneumoniae NOT DETECTED NOT DETECTED Final   Proteus species NOT DETECTED NOT DETECTED Final   Salmonella species NOT DETECTED NOT DETECTED Final   Serratia marcescens NOT DETECTED NOT DETECTED Final   Haemophilus influenzae NOT DETECTED NOT DETECTED Final   Neisseria meningitidis NOT DETECTED NOT DETECTED Final   Pseudomonas aeruginosa NOT DETECTED NOT DETECTED Final   Stenotrophomonas maltophilia NOT  DETECTED NOT DETECTED Final   Candida albicans NOT DETECTED NOT DETECTED Final   Candida auris NOT DETECTED NOT DETECTED Final   Candida glabrata NOT DETECTED NOT DETECTED Final   Candida krusei NOT DETECTED NOT DETECTED Final   Candida parapsilosis NOT DETECTED NOT DETECTED Final   Candida tropicalis NOT DETECTED NOT DETECTED Final   Cryptococcus neoformans/gattii NOT DETECTED NOT DETECTED Final    Comment: Performed at [facility], [address], [place], [place] [address]  Culture, blood (Routine X 2) w Reflex to ID Panel     Status: None (Preliminary result)   Collection Time: 12/12/22  8:20 PM   Specimen: BLOOD  Result Value Ref Range Status   Specimen Description   Final    BLOOD BLOOD LEFT HAND Performed at [facility], [address] [place], [place] [address]    Special Requests   Final    BOTTLES DRAWN AEROBIC AND ANAEROBIC Blood Culture results may not be optimal due to an inadequate volume of blood received in culture bottles Performed at [facility], [address], [place], [place] [address]    Culture   Final    NO GROWTH 4 DAYS Performed at [facility], [address], [place], [place] [address]    Report Status PENDING  Incomplete  Respiratory (~20 pathogens) panel by PCR     Status: None   Collection Time: 12/13/22  9:15 AM   Specimen: Nasopharyngeal Swab; Respiratory  Result Value Ref Range Status   Adenovirus NOT DETECTED NOT DETECTED Final   Coronavirus 229E NOT DETECTED NOT DETECTED Final    Comment: (NOTE) The Coronavirus on the Respiratory Panel, DOES NOT test for the novel  Coronavirus (2019 nCoV)    Coronavirus HKU1 NOT DETECTED NOT DETECTED Final   Coronavirus NL63 NOT DETECTED NOT DETECTED Final   Coronavirus OC43 NOT DETECTED NOT DETECTED Final   Metapneumovirus NOT DETECTED NOT DETECTED Final   Rhinovirus / Enterovirus NOT DETECTED NOT DETECTED Final   Influenza A NOT DETECTED NOT DETECTED  Final   Influenza B NOT DETECTED NOT DETECTED Final   Parainfluenza Virus 1 NOT DETECTED NOT DETECTED Final  Parainfluenza Virus 2 NOT DETECTED NOT DETECTED Final   Parainfluenza Virus 3 NOT DETECTED NOT DETECTED Final   Parainfluenza Virus 4 NOT DETECTED NOT DETECTED Final   Respiratory Syncytial Virus NOT DETECTED NOT DETECTED Final   Bordetella pertussis NOT DETECTED NOT DETECTED Final   Bordetella Parapertussis NOT DETECTED NOT DETECTED Final   Chlamydophila pneumoniae NOT DETECTED NOT DETECTED Final   Mycoplasma pneumoniae NOT DETECTED NOT DETECTED Final    Comment: Performed at [facility], [address] [place], [place] [address]  Culture, blood (Routine X 2) w Reflex to ID Panel     Status: None (Preliminary result)   Collection Time: 12/14/22  1:48 PM   Specimen: BLOOD RIGHT HAND  Result Value Ref Range Status   Specimen Description   Final    BLOOD RIGHT HAND Performed at [facility], [address] [place], [place] [address]    Special Requests   Final    BOTTLES DRAWN AEROBIC AND ANAEROBIC Blood Culture results may not be optimal due to an inadequate volume of blood received in culture bottles Performed at [facility], [address], [place], [place] [address]    Culture   Final    NO GROWTH 2 DAYS Performed at [facility], [address] [place], [place] [address]    Report Status PENDING  Incomplete  Culture, blood (Routine X 2) w Reflex to ID Panel     Status: None (Preliminary result)   Collection Time: 12/14/22  1:53 PM   Specimen: BLOOD RIGHT HAND  Result Value Ref Range Status   Specimen Description   Final    BLOOD RIGHT HAND Performed at [facility], [address], [place], [place] [address]    Special Requests   Final    BOTTLES DRAWN AEROBIC ONLY Blood Culture results may not be optimal due to an inadequate volume of blood received in culture bottles Performed at [facility], [address], [place], [place] [address]    Culture   Final    NO GROWTH 2 DAYS Performed at [facility], [address], [place], [place] [address]    Report Status PENDING  Incomplete         Radiology Studies: No results found.      Scheduled Meds:  albuterol  2.5 mg Nebulization BID   apixaban  2.5 mg Oral BID   diltiazem  240 mg Oral Daily   DULoxetine  60 mg Oral Daily   ezetimibe  10 mg Oral Daily   ferrous sulfate  325 mg Oral Q breakfast   fluticasone furoate-vilanterol  1 puff Inhalation Daily   And   umeclidinium bromide  1 puff Inhalation Daily   [START ON 12/17/2022] metoprolol succinate  25 mg Oral Daily   mirtazapine  7.5 mg Oral QHS   nicotine  14 mg Transdermal Daily   pregabalin  75 mg Oral TID   rosuvastatin  40 mg Oral Daily   sodium chloride flush  3 mL Intravenous Q12H   Continuous Infusions:  ceFEPime (MAXIPIME) IV Stopped (12/16/22 0701)   sodium chloride       LOS: 4 days    Time spent: 35 minutes    Leigha Olberding A Konner Saiz, MD [facility]   If 7PM-7AM, please contact night-coverage [email or web address]  12/16/2022, 3:18 PM

## 2022-12-17 DIAGNOSIS — R0902 Hypoxemia: Secondary | ICD-10-CM | POA: Diagnosis not present

## 2022-12-17 LAB — CULTURE, BLOOD (ROUTINE X 2): Culture: NO GROWTH

## 2022-12-17 MED ORDER — SODIUM CHLORIDE 0.9 % IV BOLUS
500.0000 mL | Freq: Once | INTRAVENOUS | Status: AC
  Administered 2022-12-17: 500 mL via INTRAVENOUS

## 2022-12-17 NOTE — Progress Notes (Signed)
   12/17/22 2023  BiPAP/CPAP/SIPAP  Reason BIPAP/CPAP not in use Other(comment) (no need of bipap at this time)  BiPAP/CPAP /SiPAP Vitals  Resp 19  MEWS Score/Color  MEWS Score 0  MEWS Score Color [place] Si

## 2022-12-17 NOTE — Progress Notes (Signed)
Kelly [name], Kelly DOA: [name]/25/2024 PCP: Trisha Mangle, Kelly [name]   Brief Narrative: 82 year old with past medical history significant for COPD, heart failure presents with shortness of breath found to have pneumonia respiratory failure, also in the differential heart failure   Assessment & Plan:   Principal Problem:   Hypoxia Active Problems:   Anemia, iron deficiency   Severe concentric left ventricular hypertrophy   Paroxysmal atrial fibrillation (HCC)   AKI (acute kidney injury) (HCC)   CAP (community acquired pneumonia)  1-Acute hypoxic respiratory failure Community-acquired pneumonia Interstitial pulmonary edema -Chest x ray improved edema.  -Spike fever [name]/27. Change ceftriaxone to cefepime./  -Repeat blood culture: No growth to date.  -Cardiology consulted for HF -Continue nebulizer.  -Treated with IV lasix.  Stable.   Hypotension:  Patient SBP drops to 84 on [name]/29. Improved with 250 cc IV fluid.  SBP today high 90. Will hold Cardizem, metoprolol and lasix. Discussed with cardiology    AKI: Resolved.  Cr down to 1.0 form 1.4  COPD:Continue with Incruse, Breo. Albuterol.   Paroxysmal A-fib: Continue with Cardizem and eliquis.   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 Cardiology consulted.  -received IV lasix during this hospitalization.  Hold lasix. SBP soft.   Blood culture positive for Staph Hominis.  Repeat Blood culture. No growth to date.   Iron deficiency anemia: Continue with ferrous sulfate.   Hyperlipidemia: On Crestor.   Depression: Continue with remeron.   Groundglass opacity: Needs follow up CT chest     Estimated body mass index is 20.32 kg/m as calculated from the following:   Height as of this encounter: 5\' 2"  (1.575 m).   Weight as of this encounter: 50.4 kg.   DVT prophylaxis: Eliquis Code Status: full  code Family Communication: Care discussed with patient.  Disposition Plan:  Status is: Inpatient Remains inpatient appropriate because: management of PNA    Consultants:  Cardiology   Procedures:  ECHO  Antimicrobials:  Azithromycin Cefepime.   Subjective: She is feeling ok, breathing well.   Objective: Vitals:   [name]/30/24 0447 [name]/30/24 0800 [name]/30/24 0844 [name]/30/24 0848  BP: (!) 103/50   94/61  Pulse: 68   73  Resp:  20  20  Temp: 98.5 F (36.9 C)   98.2 F (36.8 C)  TempSrc: Oral     SpO2: 92%  92% 92%  Weight: 50.4 kg     Height:        Intake/Output Summary (Last 24 hours) at [name]/30/2024 1330 Last data filed at [name]/30/2024 0800 Gross per 24 hour  Intake 840 ml  Output 700 ml  Net 140 ml   Filed Weights   [name]/28/24 0500 [name]/29/24 0500 [name]/30/24 0447  Weight: 48.2 kg 50.2 kg 50.4 kg    Examination:  General exam:  NAD Respiratory system: CTA Cardiovascular system: S 1, S 2 RRR Gastrointestinal system: BS present, soft, nt Central nervous system: Non focal.  Extremities: no edema    Data Reviewed: I have personally reviewed following labs and imaging studies  CBC: Recent Labs  Lab [name]/25/24 1453 [name]/25/24 1845 [name]/26/24 0633 [name]/27/24 0427 [name]/28/24 0741  WBC 13.3* 13.0* 7.1 15.9* 9.5  NEUTROABS 12.0*  --   --   --   --   HGB 7.7* 7.5* 8.4* 9.0* 8.7*  HCT 25.2* 25.4* 27.7* 30.1* 28.5*  MCV 103.7* 105.8* 102.6* 104.9* 103.6*  PLT 280 301 215 235 219  Basic Metabolic Panel: Recent Labs  Lab [name]/25/24 1453 [name]/25/24 2306 [name]/26/24 0633 [name]/27/24 0427 [name]/27/24 1348 [name]/28/24 0438 [name]/29/24 0419  NA 136  --  139 143  --  139 141  K 3.8  --  4.2 4.1  --  3.8 4.0  CL 105  --  110 112*  --  108 110  CO2 20*  --  20* 21*  --  22 23  GLUCOSE 225*  --  174* 134*  --  125* 110*  BUN 25*  --  29* 27*  --  20 18  CREATININE 1.39* 1.40* 1.09* 1.00  --  0.93 0.92  CALCIUM 8.9  --  8.5* 8.2*  --  8.1* 8.4*  MG  --   --   --   --  2.2  --   --     GFR: Estimated Creatinine Clearance: 37.3 mL/min (by C-G formula based on SCr of 0.92 mg/dL). Liver Function Tests: Recent Labs  Lab [name]/25/24 2306  AST 25  ALT 17  ALKPHOS 51  BILITOT 0.3  PROT 6.5  ALBUMIN 3.6   No results for input(s): "LIPASE", "AMYLASE" in the last 168 hours. No results for input(s): "AMMONIA" in the last 168 hours. Coagulation Profile: Recent Labs  Lab [name]/26/24 0633  INR 1.3*   Cardiac Enzymes: No results for input(s): "CKTOTAL", "CKMB", "CKMBINDEX", "TROPONINI" in the last 168 hours. BNP (last 3 results) No results for input(s): "PROBNP" in the last 8760 hours. HbA1C: No results for input(s): "HGBA1C" in the last 72 hours. CBG: No results for input(s): "GLUCAP" in the last 168 hours. Lipid Profile: No results for input(s): "CHOL", "HDL", "LDLCALC", "TRIG", "CHOLHDL", "LDLDIRECT" in the last 72 hours. Thyroid Function Tests: No results for input(s): "TSH", "T4TOTAL", "FREET4", "T3FREE", "THYROIDAB" in the last 72 hours. Anemia Panel: No results for input(s): "VITAMINB12", "FOLATE", "FERRITIN", "TIBC", "IRON", "RETICCTPCT" in the last 72 hours.  Sepsis Labs: No results for input(s): "PROCALCITON", "LATICACIDVEN" in the last 168 hours.  Recent Results (from the past 240 hour(s))  Resp panel by RT-PCR (RSV, Flu A&B, Covid) Anterior Nasal Swab     Status: None   Collection Time: [name]/25/24  2:58 PM   Specimen: Anterior Nasal Swab  Result Value Ref Range Status   SARS Coronavirus 2 by RT PCR NEGATIVE NEGATIVE Final    Comment: (NOTE) SARS-CoV-2 target nucleic acids are NOT DETECTED.  The SARS-CoV-2 RNA is generally detectable in upper respiratory specimens during the acute phase of infection. The lowest concentration of SARS-CoV-2 viral copies this assay can detect is 138 copies/mL. A negative result does not preclude SARS-Cov-2 infection and should not be used as the sole basis for treatment or other patient management decisions. A negative  result may occur with  improper specimen collection/handling, submission of specimen other than nasopharyngeal swab, presence of viral mutation(s) within the areas targeted by this assay, and inadequate number of viral copies(<138 copies/mL). A negative result must be combined with clinical observations, patient history, and epidemiological information. The expected result is Negative.  Fact Sheet for Patients:  BloggerCourse.com  Fact Sheet for Healthcare Providers:  SeriousBroker.it  This test is no t yet approved or cleared by the [place] FDA and  has been authorized for detection and/or diagnosis of SARS-CoV-2 by FDA under an Emergency Use Authorization (EUA). This EUA will remain  in effect (meaning this test can be used) for the duration of the COVID-19 declaration under Section 564(b)(1) of the Act, 21 U.S.C.section 360bbb-3(b)(1), unless the  authorization is terminated  or revoked sooner.       Influenza A by PCR NEGATIVE NEGATIVE Final   Influenza B by PCR NEGATIVE NEGATIVE Final    Comment: (NOTE) The Xpert Xpress SARS-CoV-2/FLU/RSV plus assay is intended as an aid in the diagnosis of influenza from Nasopharyngeal swab specimens and should not be used as a sole basis for treatment. Nasal washings and aspirates are unacceptable for Xpert Xpress SARS-CoV-2/FLU/RSV testing.  Fact Sheet for Patients: BloggerCourse.com  Fact Sheet for Healthcare Providers: SeriousBroker.it  This test is not yet approved or cleared by the [place] FDA and has been authorized for detection and/or diagnosis of SARS-CoV-2 by FDA under an Emergency Use Authorization (EUA). This EUA will remain in effect (meaning this test can be used) for the duration of the COVID-19 declaration under Section 564(b)(1) of the Act, 21 U.S.C. section 360bbb-3(b)(1), unless the authorization is  terminated or revoked.     Resp Syncytial Virus by PCR NEGATIVE NEGATIVE Final    Comment: (NOTE) Fact Sheet for Patients: BloggerCourse.com  Fact Sheet for Healthcare Providers: SeriousBroker.it  This test is not yet approved or cleared by the [place] FDA and has been authorized for detection and/or diagnosis of SARS-CoV-2 by FDA under an Emergency Use Authorization (EUA). This EUA will remain in effect (meaning this test can be used) for the duration of the COVID-19 declaration under Section 564(b)(1) of the Act, 21 U.S.C. section 360bbb-3(b)(1), unless the authorization is terminated or revoked.  Performed at [facility], [address] [place], [place] [address]   Culture, blood (Routine X 2) w Reflex to ID Panel     Status: Abnormal (Preliminary result)   Collection Time: [name]/25/24  8:09 PM   Specimen: BLOOD LEFT FOREARM  Result Value Ref Range Status   Specimen Description   Final    BLOOD LEFT FOREARM Performed at [facility], [address] [place], [place] [address]    Special Requests   Final    BOTTLES DRAWN AEROBIC AND ANAEROBIC Blood Culture adequate volume Performed at [facility], [address] [place], [place] [address]    Culture  Setup Time   Final    GRAM POSITIVE COCCI ANAEROBIC BOTTLE ONLY CRITICAL RESULT CALLED TO, READ BACK BY AND VERIFIED WITH: PHARMD D. WOFFORD [id number] @ 1807 FH GRAM POSITIVE RODS AEROBIC BOTTLE ONLY CRITICAL RESULT CALLED TO, READ BACK BY AND VERIFIED WITH: PHARMD E. JACKSON [name]/29/24 @ 0304 BY AB    Culture (A)  Final    STAPHYLOCOCCUS HOMINIS THE SIGNIFICANCE OF ISOLATING THIS ORGANISM FROM A SINGLE SET OF BLOOD CULTURES WHEN MULTIPLE SETS ARE DRAWN IS UNCERTAIN. PLEASE NOTIFY THE MICROBIOLOGY DEPARTMENT WITHIN ONE WEEK IF SPECIATION AND SENSITIVITIES ARE REQUIRED. CULTURE REINCUBATED FOR BETTER GROWTH Performed at [facility], [address] [place] [address], [place], [place] [address]    Report Status PENDING  Incomplete  Blood Culture ID Panel (Reflexed)     Status: Abnormal   Collection Time: [name]/25/24  8:09 PM  Result Value Ref Range Status   Enterococcus faecalis NOT DETECTED NOT DETECTED Final   Enterococcus Faecium NOT DETECTED NOT DETECTED Final   Listeria monocytogenes NOT DETECTED NOT DETECTED Final   Staphylococcus species DETECTED (A) NOT DETECTED Final    Comment: CRITICAL RESULT CALLED TO, READ BACK BY AND VERIFIED WITH: PHARMD D. WNUUVOZ [id number] @ 1807 FH    Staphylococcus aureus (BCID) NOT DETECTED NOT DETECTED Final   Staphylococcus epidermidis NOT  DETECTED NOT DETECTED Final   Staphylococcus lugdunensis NOT DETECTED NOT DETECTED Final   Streptococcus species NOT DETECTED NOT DETECTED Final   Streptococcus agalactiae NOT DETECTED NOT DETECTED Final   Streptococcus pneumoniae NOT DETECTED NOT DETECTED Final   Streptococcus pyogenes NOT DETECTED NOT DETECTED Final   A.calcoaceticus-baumannii NOT DETECTED NOT DETECTED Final   Bacteroides fragilis NOT DETECTED NOT DETECTED Final   Enterobacterales NOT DETECTED NOT DETECTED Final   Enterobacter cloacae complex NOT DETECTED NOT DETECTED Final   Escherichia coli NOT DETECTED NOT DETECTED Final   Klebsiella aerogenes NOT DETECTED NOT DETECTED Final   Klebsiella oxytoca NOT DETECTED NOT DETECTED Final   Klebsiella pneumoniae NOT DETECTED NOT DETECTED Final   Proteus species NOT DETECTED NOT DETECTED Final   Salmonella species NOT DETECTED NOT DETECTED Final   Serratia marcescens NOT DETECTED NOT DETECTED Final   Haemophilus influenzae NOT DETECTED NOT DETECTED Final   Neisseria meningitidis NOT DETECTED NOT DETECTED Final   Pseudomonas aeruginosa NOT DETECTED NOT DETECTED Final   Stenotrophomonas maltophilia NOT DETECTED NOT DETECTED Final   Candida albicans NOT DETECTED NOT DETECTED Final   Candida auris NOT DETECTED NOT DETECTED Final   Candida  glabrata NOT DETECTED NOT DETECTED Final   Candida krusei NOT DETECTED NOT DETECTED Final   Candida parapsilosis NOT DETECTED NOT DETECTED Final   Candida tropicalis NOT DETECTED NOT DETECTED Final   Cryptococcus neoformans/gattii NOT DETECTED NOT DETECTED Final    Comment: Performed at [facility], [address] [place], [place] [address]  Culture, blood (Routine X 2) w Reflex to ID Panel     Status: None   Collection Time: [name]/25/24  8:20 PM   Specimen: BLOOD  Result Value Ref Range Status   Specimen Description   Final    BLOOD BLOOD LEFT HAND Performed at [facility], [address], [place], [place] [address]    Special Requests   Final    BOTTLES DRAWN AEROBIC AND ANAEROBIC Blood Culture results may not be optimal due to an inadequate volume of blood received in culture bottles Performed at [facility], [address] [place], [place] [address]    Culture   Final    NO GROWTH 5 DAYS Performed at [facility], [address] [place], [place] [address]    Report Status [name]/30/2024 FINAL  Final  Respiratory (~20 pathogens) panel by PCR     Status: None   Collection Time: [name]/26/24  9:15 AM   Specimen: Nasopharyngeal Swab; Respiratory  Result Value Ref Range Status   Adenovirus NOT DETECTED NOT DETECTED Final   Coronavirus 229E NOT DETECTED NOT DETECTED Final    Comment: (NOTE) The Coronavirus on the Respiratory Panel, DOES NOT test for the novel  Coronavirus (2019 nCoV)    Coronavirus HKU1 NOT DETECTED NOT DETECTED Final   Coronavirus NL63 NOT DETECTED NOT DETECTED Final   Coronavirus OC43 NOT DETECTED NOT DETECTED Final   Metapneumovirus NOT DETECTED NOT DETECTED Final   Rhinovirus / Enterovirus NOT DETECTED NOT DETECTED Final   Influenza A NOT DETECTED NOT DETECTED Final   Influenza B NOT DETECTED NOT DETECTED Final   Parainfluenza Virus 1 NOT DETECTED NOT DETECTED Final   Parainfluenza Virus 2 NOT DETECTED NOT  DETECTED Final   Parainfluenza Virus 3 NOT DETECTED NOT DETECTED Final   Parainfluenza Virus 4 NOT DETECTED NOT DETECTED Final   Respiratory Syncytial Virus NOT DETECTED NOT DETECTED Final   Bordetella pertussis NOT  DETECTED NOT DETECTED Final   Bordetella Parapertussis NOT DETECTED NOT DETECTED Final   Chlamydophila pneumoniae NOT DETECTED NOT DETECTED Final   Mycoplasma pneumoniae NOT DETECTED NOT DETECTED Final    Comment: Performed at [facility], [address] [place], [place] [address]  Culture, blood (Routine X 2) w Reflex to ID Panel     Status: None (Preliminary result)   Collection Time: [name]/27/24  1:48 PM   Specimen: BLOOD RIGHT HAND  Result Value Ref Range Status   Specimen Description   Final    BLOOD RIGHT HAND Performed at [facility], [address] [place], [place] [address]    Special Requests   Final    BOTTLES DRAWN AEROBIC AND ANAEROBIC Blood Culture results may not be optimal due to an inadequate volume of blood received in culture bottles Performed at [facility], [address] [place], [place] [address]    Culture   Final    NO GROWTH 3 DAYS Performed at [facility], [address] [place], [place] [address]    Report Status PENDING  Incomplete  Culture, blood (Routine X 2) w Reflex to ID Panel     Status: None (Preliminary result)   Collection Time: [name]/27/24  1:53 PM   Specimen: BLOOD RIGHT HAND  Result Value Ref Range Status   Specimen Description   Final    BLOOD RIGHT HAND Performed at [facility], [address], [place], [place] [address]    Special Requests   Final    BOTTLES DRAWN AEROBIC ONLY Blood Culture results may not be optimal due to an inadequate volume of blood received in culture bottles Performed at [facility], [address] [place], [place] [address]    Culture   Final    NO GROWTH 3 DAYS Performed at [facility], [address] [place], [place]  [address]    Report Status PENDING  Incomplete         Radiology Studies: No results found.      Scheduled Meds:  albuterol  2.5 mg Nebulization BID   apixaban  2.5 mg Oral BID   diltiazem  240 mg Oral Daily   DULoxetine  60 mg Oral Daily   ezetimibe  10 mg Oral Daily   ferrous sulfate  325 mg Oral Q breakfast   fluticasone furoate-vilanterol  1 puff Inhalation Daily   And   umeclidinium bromide  1 puff Inhalation Daily   mirtazapine  7.5 mg Oral QHS   nicotine  14 mg Transdermal Daily   pregabalin  75 mg Oral TID   rosuvastatin  40 mg Oral Daily   sodium chloride flush  3 mL Intravenous Q12H   Continuous Infusions:  ceFEPime (MAXIPIME) IV 2 g ([name]/30/24 0650)     LOS: 5 days    Time spent: 35 minutes    Cheri Ayotte A Liylah Najarro, MD [facility]   If 7PM-7AM, please contact night-coverage [email or web address]  [name]/30/2024, 1:30 PM

## 2022-12-17 NOTE — Plan of Care (Signed)
  Problem: Pain Management: Goal: General experience of comfort will improve Outcome: Progressing   Problem: Safety: Goal: Ability to remain free from injury will improve Outcome: Progressing

## 2022-12-18 DIAGNOSIS — I5033 Acute on chronic diastolic (congestive) heart failure: Secondary | ICD-10-CM | POA: Diagnosis not present

## 2022-12-18 LAB — BASIC METABOLIC PANEL
Anion gap: 5 (ref 5–15)
BUN: 22 mg/dL (ref 8–23)
CO2: 24 mmol/L (ref 22–32)
Calcium: 8.3 mg/dL — ABNORMAL LOW (ref 8.9–10.3)
Chloride: 108 mmol/L (ref 98–111)
Creatinine, Ser: 0.87 mg/dL (ref 0.44–1.00)
GFR, Estimated: >60 mL/min (ref >60)
Glucose, Bld: 106 mg/dL — ABNORMAL HIGH (ref 70–99)
Potassium: 4 mmol/L (ref 3.5–5.1)
Sodium: 137 mmol/L (ref 135–145)

## 2022-12-18 LAB — CBC
HCT: 27 % — ABNORMAL LOW (ref 36.0–46.0)
Hemoglobin: 8.2 g/dL — ABNORMAL LOW (ref 12.0–15.0)
MCH: 30.7 pg (ref 26.0–34.0)
MCHC: 30.4 g/dL (ref 30.0–36.0)
MCV: 101.1 fL — ABNORMAL HIGH (ref 80.0–100.0)
Platelets: 173 10*3/uL (ref 150–400)
RBC: 2.67 MIL/uL — ABNORMAL LOW (ref 3.87–5.11)
RDW: 14.9 % (ref 11.5–15.5)
WBC: 7 10*3/uL (ref 4.0–10.5)
nRBC: 0 % (ref 0.0–0.2)

## 2022-12-18 MED ORDER — SODIUM CHLORIDE 0.9 % IV BOLUS
500.0000 mL | Freq: Once | INTRAVENOUS | Status: AC
  Administered 2022-12-18: 500 mL via INTRAVENOUS

## 2022-12-18 NOTE — Plan of Care (Signed)
  Problem: Safety: Goal: Ability to remain free from injury will improve Outcome: Progressing   Problem: Activity: Goal: Ability to tolerate increased activity will improve Outcome: Progressing   

## 2022-12-18 NOTE — Progress Notes (Signed)
Rounding Note    Patient Name: Kelly [name] Date of Encounter: 12/18/2022  [facility] Cardiologist: Jacinto Halim  Subjective   No complaints  Inpatient Medications    Scheduled Meds:  albuterol  2.5 mg Nebulization BID   apixaban  2.5 mg Oral BID   DULoxetine  60 mg Oral Daily   ezetimibe  10 mg Oral Daily   ferrous sulfate  325 mg Oral Q breakfast   fluticasone furoate-vilanterol  1 puff Inhalation Daily   And   umeclidinium bromide  1 puff Inhalation Daily   mirtazapine  7.5 mg Oral QHS   nicotine  14 mg Transdermal Daily   pregabalin  75 mg Oral TID   rosuvastatin  40 mg Oral Daily   sodium chloride flush  3 mL Intravenous Q12H   Continuous Infusions:  ceFEPime (MAXIPIME) IV 2 g (12/18/22 0640)   PRN Meds: acetaminophen **OR** acetaminophen, ipratropium-albuterol, polyethylene glycol   Vital Signs    Vitals:   12/17/22 2021 12/17/22 2023 12/17/22 2046 12/18/22 0418  BP:   (!) 103/58 107/65  Pulse:   86 85  Resp:  19 20   Temp:   98.4 F (36.9 C) 98.6 F (37 C)  TempSrc:   Oral Oral  SpO2: 98%  99% 94%  Weight:    51.3 kg  Height:        Intake/Output Summary (Last 24 hours) at 12/18/2022 0752 Last data filed at 12/17/2022 1907 Gross per 24 hour  Intake 570 ml  Output 1050 ml  Net -480 ml      12/18/2022    4:18 AM 12/17/2022    4:47 AM 12/16/2022    5:00 AM  Last 3 Weights  Weight (lbs) 113 lb 1.5 oz 111 lb 1.8 oz 110 lb 10.7 oz  Weight (kg) 51.3 kg 50.4 kg 50.2 kg      Telemetry    SR - Personally Reviewed  ECG    N/a - Personally Reviewed  Physical Exam   GEN: No acute distress.   Neck: No JVD Cardiac: RRR, 3/6 systolic murmur rusb, no jvd  Respiratory: Clear to auscultation bilaterally. GI: Soft, nontender, non-distended  MS: No edema; No deformity. Neuro:  Nonfocal  Psych: Normal affect   Labs    High Sensitivity Troponin:   Recent Labs  Lab 12/12/22 2306 12/13/22 0109  TROPONINIHS 32* 28*      Chemistry Recent Labs  Lab 12/12/22 2306 12/13/22 4327 12/14/22 0427 12/14/22 1348 12/15/22 0438 12/16/22 0419  NA  --    < > 143  --  139 141  K  --    < > 4.1  --  3.8 4.0  CL  --    < > 112*  --  108 110  CO2  --    < > 21*  --  22 23  GLUCOSE  --    < > 134*  --  125* 110*  BUN  --    < > 27*  --  20 18  CREATININE 1.40*   < > 1.00  --  0.93 0.92  CALCIUM  --    < > 8.2*  --  8.1* 8.4*  MG  --   --   --  2.2  --   --   PROT 6.5  --   --   --   --   --   ALBUMIN 3.6  --   --   --   --   --  AST 25  --   --   --   --   --   ALT 17  --   --   --   --   --   ALKPHOS 51  --   --   --   --   --   BILITOT 0.3  --   --   --   --   --   GFRNONAA 38*   < > 56*  --  >60 >60  ANIONGAP  --    < > 10  --  9 8   < > = values in this interval not displayed.    Lipids No results for input(s): "CHOL", "TRIG", "HDL", "LABVLDL", "LDLCALC", "CHOLHDL" in the last 168 hours.  Hematology Recent Labs  Lab 12/13/22 1610 12/14/22 0427 12/15/22 0741  WBC 7.1 15.9* 9.5  RBC 2.70* 2.87* 2.75*  HGB 8.4* 9.0* 8.7*  HCT 27.7* 30.1* 28.5*  MCV 102.6* 104.9* 103.6*  MCH 31.1 31.4 31.6  MCHC 30.3 29.9* 30.5  RDW 16.8* 17.6* 16.9*  PLT 215 235 219   Thyroid No results for input(s): "TSH", "FREET4" in the last 168 hours.  BNP Recent Labs  Lab 12/12/22 1453  BNP 1,067.4*    DDimer No results for input(s): "DDIMER" in the last 168 hours.   Radiology    No results found.  Cardiac Studies     Patient Profile     82 year old female with past medical history of hypertrophic obstructive cardiomyopathy, paroxysmal atrial fibrillation, hypertension, hyperlipidemia, COPD, alcohol abuse, history of GI bleed admitted with possible pneumonia for evaluation of hypertrophic cardiomyopathy and possible acute diastolic congestive heart failure. Chest CT showed probable pulmonary edema with possible right lower lobe pneumonia, groundglass opacities in the lungs and follow-up CT recommended 3 months as  well as pulmonary nodule. Echocardiogram showed normal LV function, severe asymmetric septal hypertrophy, grade 3 diastolic dysfunction, systolic anterior motion of the mitral valve with peak gradient 219 mmHg, severe left atrial enlargement, moderate mitral regurgitation.  Assessment & Plan     1. Acute on chronic HFpEF - diursed this admission - plans were for discharge however low bp's. NS bolus x 2 over the last 48 hours. SBP low 100s this morning. Diuretic on hold.  - orthostatics abnormal today, sitting 125/61 standing 86/56 suggesting still hypovolemic. Bolus additional NS. Follow bp's into the afternoon, see if able to restart her lopressor and diltiazem   2. HOCM - soft bp's, her home lopressor and diltiazem have been held. BP's trending up s/p IVFs - SBP low 100s early AM, repeating vitals this AM. Look to restart her lopressor and dilt if able.  - orthostatics abnormal as reported above, giving additional fluid. If bp improves look to restart her lopressor and diltiazem. Hypvolemia certaintly not favorable given her significant dynamic obstructive gradient.    3. PAF - has been in SR - she is on eliquis for stroke prevention - lopressor and dilt on hold for soft bp's  4. Pneumonia - s/p abx per primary team  For questions or updates, please contact [facility] Please consult [email or web address] for contact info under        Signed, Dina Rich, MD  12/18/2022, 7:52 AM

## 2022-12-18 NOTE — Progress Notes (Signed)
PROGRESS NOTE    Kelly [name]  ZOX:[id number] DOB: 1940-11-29 DOA: 12/12/2022 PCP: Trisha Mangle, FNP   Brief Narrative: 82 year old with past medical history significant for COPD, heart failure presents with shortness of breath found to have pneumonia respiratory failure, also in the differential heart failure   Assessment & Plan:   Principal Problem:   Hypoxia Active Problems:   Anemia, iron deficiency   Severe concentric left ventricular hypertrophy   Paroxysmal atrial fibrillation (HCC)   AKI (acute kidney injury) (HCC)   CAP (community acquired pneumonia)  1-Acute hypoxic respiratory failure Community-acquired pneumonia Interstitial pulmonary edema -Chest x ray improved edema.  -Spike fever 11/27. Change ceftriaxone to cefepime./  -Repeat blood culture: No growth to date.  -Cardiology consulted for HF -Continue nebulizer.  -Treated with IV lasix.  Stable.   Hypotension:  Patient SBP drops to 84 on 11/29. Improved with fluids Holding Cardizem and metoprolol.  Orthostatic vitals positive. Cardiology order IV bolus.  Plan to repeat orthostatic this afternoon, to see if we can resume cardizem.   AKI: Resolved.  Cr down to 1.0 form 1.4  COPD:Continue with Incruse, Breo. Albuterol.   Paroxysmal A-fib: Continue with Cardizem and eliquis.   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 Cardiology consulted.  -received IV lasix during this hospitalization.  Hold lasix. SBP soft.   Blood culture positive for Staph Hominis.  Repeat Blood culture. No growth to date.   Iron deficiency anemia: Continue with ferrous sulfate.   Hyperlipidemia: On Crestor.   Depression: Continue with remeron.   Groundglass opacity: Needs follow up CT chest     Estimated body mass index is 20.69 kg/m as calculated from the following:   Height as of this encounter: 5\' 2"  (1.575 m).   Weight as of this  encounter: 51.3 kg.   DVT prophylaxis: Eliquis Code Status: full code Family Communication: Care discussed with patient.  Disposition Plan:  Status is: Inpatient Remains inpatient appropriate because: management of PNA    Consultants:  Cardiology   Procedures:  ECHO  Antimicrobials:  Azithromycin Cefepime.   Subjective: No new complaints.   Objective: Vitals:   12/18/22 0418 12/18/22 0805 12/18/22 0903 12/18/22 1120  BP: 107/65 117/66  (!) 150/72  Pulse: 85 92    Resp:  16    Temp: 98.6 F (37 C) (!) 97.5 F (36.4 C)  98.2 F (36.8 C)  TempSrc: Oral Axillary  Oral  SpO2: 94% 95% 95% 98%  Weight: 51.3 kg     Height:        Intake/Output Summary (Last 24 hours) at 12/18/2022 1411 Last data filed at 12/18/2022 0800 Gross per 24 hour  Intake 590 ml  Output 850 ml  Net -260 ml   Filed Weights   12/16/22 0500 12/17/22 0447 12/18/22 0418  Weight: 50.2 kg 50.4 kg 51.3 kg    Examination:  General exam:  NAD Respiratory system: CTA Cardiovascular system: S 1, S  2 RRR Gastrointestinal system: BS present, soft, nt Central nervous system: Non focal.  Extremities: no edema    Data Reviewed: I have personally reviewed following labs and imaging studies  CBC: Recent Labs  Lab 12/12/22 1453 12/12/22 1845 12/13/22 0633 12/14/22 0427 12/15/22 0741 12/18/22 0741  WBC 13.3* 13.0* 7.1 15.9* 9.5 7.0  NEUTROABS 12.0*  --   --   --   --   --   HGB 7.7* 7.5* 8.4* 9.0* 8.7* 8.2*  HCT 25.2* 25.4* 27.7* 30.1*  28.5* 27.0*  MCV 103.7* 105.8* 102.6* 104.9* 103.6* 101.1*  PLT 280 301 215 235 219 173   Basic Metabolic Panel: Recent Labs  Lab 12/13/22 0633 12/14/22 0427 12/14/22 1348 12/15/22 0438 12/16/22 0419 12/18/22 0741  NA 139 143  --  139 141 137  K 4.2 4.1  --  3.8 4.0 4.0  CL 110 112*  --  108 110 108  CO2 20* 21*  --  22 23 24   GLUCOSE 174* 134*  --  125* 110* 106*  BUN 29* 27*  --  20 18 22   CREATININE 1.09* 1.00  --  0.93 0.92 0.87  CALCIUM  8.5* 8.2*  --  8.1* 8.4* 8.3*  MG  --   --  2.2  --   --   --    GFR: Estimated Creatinine Clearance: 39.4 mL/min (by C-G formula based on SCr of 0.87 mg/dL). Liver Function Tests: Recent Labs  Lab 12/12/22 2306  AST 25  ALT 17  ALKPHOS 51  BILITOT 0.3  PROT 6.5  ALBUMIN 3.6   No results for input(s): "LIPASE", "AMYLASE" in the last 168 hours. No results for input(s): "AMMONIA" in the last 168 hours. Coagulation Profile: Recent Labs  Lab 12/13/22 0633  INR 1.3*   Cardiac Enzymes: No results for input(s): "CKTOTAL", "CKMB", "CKMBINDEX", "TROPONINI" in the last 168 hours. BNP (last 3 results) No results for input(s): "PROBNP" in the last 8760 hours. HbA1C: No results for input(s): "HGBA1C" in the last 72 hours. CBG: No results for input(s): "GLUCAP" in the last 168 hours. Lipid Profile: No results for input(s): "CHOL", "HDL", "LDLCALC", "TRIG", "CHOLHDL", "LDLDIRECT" in the last 72 hours. Thyroid Function Tests: No results for input(s): "TSH", "T4TOTAL", "FREET4", "T3FREE", "THYROIDAB" in the last 72 hours. Anemia Panel: No results for input(s): "VITAMINB12", "FOLATE", "FERRITIN", "TIBC", "IRON", "RETICCTPCT" in the last 72 hours.  Sepsis Labs: No results for input(s): "PROCALCITON", "LATICACIDVEN" in the last 168 hours.  Recent Results (from the past 240 hour(s))  Resp panel by RT-PCR (RSV, Flu A&B, Covid) Anterior Nasal Swab     Status: None   Collection Time: 12/12/22  2:58 PM   Specimen: Anterior Nasal Swab  Result Value Ref Range Status   SARS Coronavirus 2 by RT PCR NEGATIVE NEGATIVE Final    Comment: (NOTE) SARS-CoV-2 target nucleic acids are NOT DETECTED.  The SARS-CoV-2 RNA is generally detectable in upper respiratory specimens during the acute phase of infection. The lowest concentration of SARS-CoV-2 viral copies this assay can detect is 138 copies/mL. A negative result does not preclude SARS-Cov-2 infection and should not be used as the sole basis for  treatment or other patient management decisions. A negative result may occur with  improper specimen collection/handling, submission of specimen other than nasopharyngeal swab, presence of viral mutation(s) within the areas targeted by this assay, and inadequate number of viral copies(<138 copies/mL). A negative result must be combined with clinical observations, patient history, and epidemiological information. The expected result is Negative.  Fact Sheet for Patients:  BloggerCourse.com  Fact Sheet for Healthcare Providers:  SeriousBroker.it  This test is no t yet approved or cleared by the [place] FDA and  has been authorized for detection and/or diagnosis of SARS-CoV-2 by FDA under an Emergency Use Authorization (EUA). This EUA will remain  in effect (meaning this test can be used) for the duration of the COVID-19 declaration under Section 564(b)(1) of the Act, 21 U.S.C.section 360bbb-3(b)(1), unless the authorization is terminated  or revoked sooner.  Influenza A by PCR NEGATIVE NEGATIVE Final   Influenza B by PCR NEGATIVE NEGATIVE Final    Comment: (NOTE) The Xpert Xpress SARS-CoV-2/FLU/RSV plus assay is intended as an aid in the diagnosis of influenza from Nasopharyngeal swab specimens and should not be used as a sole basis for treatment. Nasal washings and aspirates are unacceptable for Xpert Xpress SARS-CoV-2/FLU/RSV testing.  Fact Sheet for Patients: BloggerCourse.com  Fact Sheet for Healthcare Providers: SeriousBroker.it  This test is not yet approved or cleared by the [place] FDA and has been authorized for detection and/or diagnosis of SARS-CoV-2 by FDA under an Emergency Use Authorization (EUA). This EUA will remain in effect (meaning this test can be used) for the duration of the COVID-19 declaration under Section 564(b)(1) of the Act, 21  U.S.C. section 360bbb-3(b)(1), unless the authorization is terminated or revoked.     Resp Syncytial Virus by PCR NEGATIVE NEGATIVE Final    Comment: (NOTE) Fact Sheet for Patients: BloggerCourse.com  Fact Sheet for Healthcare Providers: SeriousBroker.it  This test is not yet approved or cleared by the [place] FDA and has been authorized for detection and/or diagnosis of SARS-CoV-2 by FDA under an Emergency Use Authorization (EUA). This EUA will remain in effect (meaning this test can be used) for the duration of the COVID-19 declaration under Section 564(b)(1) of the Act, 21 U.S.C. section 360bbb-3(b)(1), unless the authorization is terminated or revoked.  Performed at [facility], [address] [place], [place] [address]   Culture, blood (Routine X 2) w Reflex to ID Panel     Status: Abnormal (Preliminary result)   Collection Time: 12/12/22  8:09 PM   Specimen: BLOOD LEFT FOREARM  Result Value Ref Range Status   Specimen Description   Final    BLOOD LEFT FOREARM Performed at [facility], [address], [place], [place] [address]    Special Requests   Final    BOTTLES DRAWN AEROBIC AND ANAEROBIC Blood Culture adequate volume Performed at [facility], [address], [place], [place] [address]    Culture  Setup Time   Final    GRAM POSITIVE COCCI ANAEROBIC BOTTLE ONLY CRITICAL RESULT CALLED TO, READ BACK BY AND VERIFIED WITH: PHARMD D. WOFFORD [id number] @ 1807 FH GRAM POSITIVE RODS AEROBIC BOTTLE ONLY CRITICAL RESULT CALLED TO, READ BACK BY AND VERIFIED WITH: PHARMD E. JACKSON 12/16/22 @ 0304 BY AB    Culture (A)  Final    STAPHYLOCOCCUS HOMINIS THE SIGNIFICANCE OF ISOLATING THIS ORGANISM FROM A SINGLE SET OF BLOOD CULTURES WHEN MULTIPLE SETS ARE DRAWN IS UNCERTAIN. PLEASE NOTIFY THE MICROBIOLOGY DEPARTMENT WITHIN ONE WEEK IF SPECIATION AND SENSITIVITIES ARE  REQUIRED. GRAM POSITIVE RODS IDENTIFICATION TO FOLLOW Performed at [facility], [address], [place], [place] [address]    Report Status PENDING  Incomplete  Blood Culture ID Panel (Reflexed)     Status: Abnormal   Collection Time: 12/12/22  8:09 PM  Result Value Ref Range Status   Enterococcus faecalis NOT DETECTED NOT DETECTED Final   Enterococcus Faecium NOT DETECTED NOT DETECTED Final   Listeria monocytogenes NOT DETECTED NOT DETECTED Final   Staphylococcus species DETECTED (A) NOT DETECTED Final    Comment: CRITICAL RESULT CALLED TO, READ BACK BY AND VERIFIED WITH: PHARMD D. HYQMVHQ [id number] @ 1807 FH    Staphylococcus aureus (BCID) NOT DETECTED NOT DETECTED Final   Staphylococcus epidermidis NOT DETECTED NOT DETECTED Final   Staphylococcus lugdunensis NOT DETECTED NOT DETECTED  Final   Streptococcus species NOT DETECTED NOT DETECTED Final   Streptococcus agalactiae NOT DETECTED NOT DETECTED Final   Streptococcus pneumoniae NOT DETECTED NOT DETECTED Final   Streptococcus pyogenes NOT DETECTED NOT DETECTED Final   A.calcoaceticus-baumannii NOT DETECTED NOT DETECTED Final   Bacteroides fragilis NOT DETECTED NOT DETECTED Final   Enterobacterales NOT DETECTED NOT DETECTED Final   Enterobacter cloacae complex NOT DETECTED NOT DETECTED Final   Escherichia coli NOT DETECTED NOT DETECTED Final   Klebsiella aerogenes NOT DETECTED NOT DETECTED Final   Klebsiella oxytoca NOT DETECTED NOT DETECTED Final   Klebsiella pneumoniae NOT DETECTED NOT DETECTED Final   Proteus species NOT DETECTED NOT DETECTED Final   Salmonella species NOT DETECTED NOT DETECTED Final   Serratia marcescens NOT DETECTED NOT DETECTED Final   Haemophilus influenzae NOT DETECTED NOT DETECTED Final   Neisseria meningitidis NOT DETECTED NOT DETECTED Final   Pseudomonas aeruginosa NOT DETECTED NOT DETECTED Final   Stenotrophomonas maltophilia NOT DETECTED NOT DETECTED Final   Candida albicans NOT DETECTED NOT  DETECTED Final   Candida auris NOT DETECTED NOT DETECTED Final   Candida glabrata NOT DETECTED NOT DETECTED Final   Candida krusei NOT DETECTED NOT DETECTED Final   Candida parapsilosis NOT DETECTED NOT DETECTED Final   Candida tropicalis NOT DETECTED NOT DETECTED Final   Cryptococcus neoformans/gattii NOT DETECTED NOT DETECTED Final    Comment: Performed at [facility], [address], [place], [place] [address]  Culture, blood (Routine X 2) w Reflex to ID Panel     Status: None   Collection Time: 12/12/22  8:20 PM   Specimen: BLOOD  Result Value Ref Range Status   Specimen Description   Final    BLOOD BLOOD LEFT HAND Performed at [facility], [address] [place], [place] [address]    Special Requests   Final    BOTTLES DRAWN AEROBIC AND ANAEROBIC Blood Culture results may not be optimal due to an inadequate volume of blood received in culture bottles Performed at [facility], [address] [place], [place] [address]    Culture   Final    NO GROWTH 5 DAYS Performed at [facility], [address], [place], [place] [address]    Report Status 12/17/2022 FINAL  Final  Respiratory (~20 pathogens) panel by PCR     Status: None   Collection Time: 12/13/22  9:15 AM   Specimen: Nasopharyngeal Swab; Respiratory  Result Value Ref Range Status   Adenovirus NOT DETECTED NOT DETECTED Final   Coronavirus 229E NOT DETECTED NOT DETECTED Final    Comment: (NOTE) The Coronavirus on the Respiratory Panel, DOES NOT test for the novel  Coronavirus (2019 nCoV)    Coronavirus HKU1 NOT DETECTED NOT DETECTED Final   Coronavirus NL63 NOT DETECTED NOT DETECTED Final   Coronavirus OC43 NOT DETECTED NOT DETECTED Final   Metapneumovirus NOT DETECTED NOT DETECTED Final   Rhinovirus / Enterovirus NOT DETECTED NOT DETECTED Final   Influenza A NOT DETECTED NOT DETECTED Final   Influenza B NOT DETECTED NOT DETECTED Final   Parainfluenza Virus 1 NOT  DETECTED NOT DETECTED Final   Parainfluenza Virus 2 NOT DETECTED NOT DETECTED Final   Parainfluenza Virus 3 NOT DETECTED NOT DETECTED Final   Parainfluenza Virus 4 NOT DETECTED NOT DETECTED Final   Respiratory Syncytial Virus NOT DETECTED NOT DETECTED Final   Bordetella pertussis NOT DETECTED NOT DETECTED Final   Bordetella Parapertussis NOT DETECTED NOT DETECTED  Final   Chlamydophila pneumoniae NOT DETECTED NOT DETECTED Final   Mycoplasma pneumoniae NOT DETECTED NOT DETECTED Final    Comment: Performed at [facility], [address] [place], [place] [address]  Culture, blood (Routine X 2) w Reflex to ID Panel     Status: None (Preliminary result)   Collection Time: 12/14/22  1:48 PM   Specimen: BLOOD RIGHT HAND  Result Value Ref Range Status   Specimen Description   Final    BLOOD RIGHT HAND Performed at [facility], [address] [place], [place] [address]    Special Requests   Final    BOTTLES DRAWN AEROBIC AND ANAEROBIC Blood Culture results may not be optimal due to an inadequate volume of blood received in culture bottles Performed at [facility], [address], [place], [place] [address]    Culture   Final    NO GROWTH 4 DAYS Performed at [facility], [address] [place], [place] [address]    Report Status PENDING  Incomplete  Culture, blood (Routine X 2) w Reflex to ID Panel     Status: None (Preliminary result)   Collection Time: 12/14/22  1:53 PM   Specimen: BLOOD RIGHT HAND  Result Value Ref Range Status   Specimen Description   Final    BLOOD RIGHT HAND Performed at [facility], [address], [place], [place] [address]    Special Requests   Final    BOTTLES DRAWN AEROBIC ONLY Blood Culture results may not be optimal due to an inadequate volume of blood received in culture bottles Performed at [facility], [address], [place], [place] [address]    Culture   Final    NO GROWTH 4  DAYS Performed at [facility], [address], [place], [place] [address]    Report Status PENDING  Incomplete         Radiology Studies: No results found.      Scheduled Meds:  albuterol  2.5 mg Nebulization BID   apixaban  2.5 mg Oral BID   DULoxetine  60 mg Oral Daily   ezetimibe  10 mg Oral Daily   ferrous sulfate  325 mg Oral Q breakfast   fluticasone furoate-vilanterol  1 puff Inhalation Daily   And   umeclidinium bromide  1 puff Inhalation Daily   mirtazapine  7.5 mg Oral QHS   nicotine  14 mg Transdermal Daily   pregabalin  75 mg Oral TID   rosuvastatin  40 mg Oral Daily   sodium chloride flush  3 mL Intravenous Q12H   Continuous Infusions:  ceFEPime (MAXIPIME) IV 2 g (12/18/22 0640)     LOS: 6 days    Time spent: 35 minutes    Rayvon Dakin A Joeann Steppe, MD [facility]   If 7PM-7AM, please contact night-coverage [email or web address]  12/18/2022, 2:11 PM

## 2022-12-19 DIAGNOSIS — I480 Paroxysmal atrial fibrillation: Secondary | ICD-10-CM | POA: Diagnosis not present

## 2022-12-19 DIAGNOSIS — I421 Obstructive hypertrophic cardiomyopathy: Secondary | ICD-10-CM

## 2022-12-19 DIAGNOSIS — I340 Nonrheumatic mitral (valve) insufficiency: Secondary | ICD-10-CM

## 2022-12-19 DIAGNOSIS — I5031 Acute diastolic (congestive) heart failure: Secondary | ICD-10-CM | POA: Diagnosis not present

## 2022-12-19 DIAGNOSIS — Z7901 Long term (current) use of anticoagulants: Secondary | ICD-10-CM

## 2022-12-19 DIAGNOSIS — R0902 Hypoxemia: Secondary | ICD-10-CM | POA: Diagnosis not present

## 2022-12-19 HISTORY — DX: Nonrheumatic mitral (valve) insufficiency: I34.0

## 2022-12-19 HISTORY — DX: Obstructive hypertrophic cardiomyopathy: I42.1

## 2022-12-19 HISTORY — DX: Long term (current) use of anticoagulants: Z79.01

## 2022-12-19 LAB — CULTURE, BLOOD (ROUTINE X 2)
Culture: NO GROWTH
Culture: NO GROWTH
Special Requests: ADEQUATE

## 2022-12-19 LAB — PROTEIN ELECTROPHORESIS, SERUM
A/G Ratio: 1.4 (ref 0.7–1.7)
Albumin ELP: 3.9 g/dL (ref 2.9–4.4)
Alpha-1-Globulin: 0.3 g/dL (ref 0.0–0.4)
Alpha-2-Globulin: 0.7 g/dL (ref 0.4–1.0)
Beta Globulin: 1 g/dL (ref 0.7–1.3)
Gamma Globulin: 0.6 g/dL (ref 0.4–1.8)
Globulin, Total: 2.7 g/dL (ref 2.2–3.9)
Total Protein ELP: 6.6 g/dL (ref 6.0–8.5)

## 2022-12-19 LAB — CREATININE, SERUM
Creatinine, Ser: 0.82 mg/dL (ref 0.44–1.00)
GFR, Estimated: >60 mL/min (ref >60)

## 2022-12-19 MED ORDER — FUROSEMIDE 20 MG PO TABS: 20.0000 mg | ORAL_TABLET | Freq: Every day | ORAL | 0 refills | Status: AC | PRN

## 2022-12-19 MED ORDER — DILTIAZEM HCL ER COATED BEADS 180 MG PO CP24: 180.0000 mg | ORAL_CAPSULE | Freq: Every day | ORAL | 0 refills | Status: AC

## 2022-12-19 MED ORDER — DILTIAZEM HCL ER COATED BEADS 180 MG PO CP24
180.0000 mg | ORAL_CAPSULE | Freq: Every day | ORAL | Status: DC
  Administered 2022-12-19: 180 mg via ORAL
  Filled 2022-12-19: qty 1

## 2022-12-19 MED ORDER — METOPROLOL SUCCINATE ER 25 MG PO TB24
25.0000 mg | ORAL_TABLET | Freq: Every day | ORAL | Status: DC
  Administered 2022-12-19: 25 mg via ORAL
  Filled 2022-12-19: qty 1

## 2022-12-19 MED ORDER — LEVOFLOXACIN 500 MG PO TABS: 500.0000 mg | ORAL_TABLET | Freq: Every day | ORAL | 0 refills | Status: AC

## 2022-12-19 NOTE — Progress Notes (Signed)
Mobility Specialist - Progress Note   12/19/22 1534  Mobility  Activity Ambulated independently in hallway  Level of Assistance Independent  Assistive Device None  Distance Ambulated (ft) 350 ft  Range of Motion/Exercises Active  Activity Response Tolerated well  Mobility Referral Yes  $Mobility charge 1 Mobility  Mobility Specialist Start Time (ACUTE ONLY) 1524  Mobility Specialist Stop Time (ACUTE ONLY) 1534  Mobility Specialist Time Calculation (min) (ACUTE ONLY) 10 min   Pt was found in bed and agreeable to ambulate. No complaints with session. At EOS returned to bed with all needs met. Call bell in reach.  Billey Chang Mobility Specialist

## 2022-12-19 NOTE — Progress Notes (Addendum)
Patient Name: Kelly [name] Date of Encounter: 12/19/2022 [facility] Cardiologist:Dr Jacinto Halim   Interval Summary  .    82 y.o. 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 Cardiology following for HOCM. She was felt in acute CHF at admission, s/p diuresis with lasix, BP has been low for the past 48 hours and orthostatic hypotensive yesterday, she has required repeat fluid bolus, lasix 20mg  has been hold.   Echo 12/13/22 showed severe dynamic LVOT obstruction from concentric hypertrophy and mitral valve SAM. Peak gradient with valsalva. LVEF 65-70%. No RWMA. Severe LVH. Grade III DD. Normal RV. Severe LAE. Moderate MR. (LVOT gradient has increased comparing to 07/14/2021 echo)   BP 107/65-150/72 over the past 24 hours. She feels well this morning. She wants to go home. She denied any dizziness, chest pain, felt SOB is much improved, tolerated ambulation in the room.   Vital Signs .    Vitals:   12/19/22 0916 12/19/22 1101 12/19/22 1239 12/19/22 1322  BP: (!) 141/51 94/64 (!) 117/46 (!) 130/48  Pulse: 95 85 76 63  Resp:  17 17   Temp:   98.3 F (36.8 C)   TempSrc:   Oral   SpO2:   96%   Weight:      Height:        Intake/Output Summary (Last 24 hours) at 12/19/2022 1612 Last data filed at 12/19/2022 1100 Gross per 24 hour  Intake 483 ml  Output --  Net 483 ml      12/18/2022    4:18 AM 12/17/2022    4:47 AM 12/16/2022    5:00 AM  Last 3 Weights  Weight (lbs) 113 lb 1.5 oz 111 lb 1.8 oz 110 lb 10.7 oz  Weight (kg) 51.3 kg 50.4 kg 50.2 kg      Telemetry/ECG    Sinus rhythm, short runs of atrial tachycardia noted  - Personally Reviewed  Physical Exam .   GEN: No acute distress.    Neck: No JVD Cardiac: RRR, grade III systolic murmur throughout  Respiratory: Clear to auscultation bilaterally. On room air.  GI: Soft, nontender, non-distended  MS: No leg edema  Assessment & Plan .     HOCM Mitral regurgitation - Echo 12/13/22 showed severe dynamic LVOT obstruction from concentric hypertrophy and mitral valve SAM. Peak gradient with valsalva. LVEF 65-70%. No RWMA. Severe LVH. Grade III DD. Normal RV. Severe LAE. Moderate MR. (LVOT gradient has increased comparing to 07/14/2021 echo) - admitted for CAP, she was felt in acute CHF at admission by Dr Jens Som, given IV lasix and placed on PO 20mg  daily, hypotensive for the past 48 hours with orthostatic hypotension requiring repeat fluid bolus, BP is now stable and normal, please discharge on PRN Lasix 20mg  daily when weight is >2 pounds in 24 hours or >5 pounds in 1 week, or with increased SOB and leg edema  - avoid tachycardia, maintain euvolemia  - she is not in A fib currently, will resume PTA diltiazem at lower dose 180 daily today and continue PTA eliquis  - given hypotension, will resume PTA metoprolol XL at lower dose 25mg  daily today  - advised patient to check BP daily at home and bring logs to next follow up visit  -  message has been sent for referral to Dr. Izora Ribas office, for discussion of option regarding mTEER, alcohol step ablation, she is unlikely a good candidate for myectomy and mitral repair  - follow up with APP at [facility] office has been arranged 01/19/23    Paroxysmal atrial fibrillation -She remains in sinus rhythm at this time -Continue PTA diltiazem, Toprol XL (at lower dose due to avoid hypotension) and Eliquis   Community-acquired pneumonia COPD Anemia CKD Bacteremia -Per primary team     For questions or updates, please contact [facility] Please consult [email or web address] for contact info under    Signed, Cyndi Bender, NP   ADDENDUM:   Patient seen and examined  with Cyndi Bender, NP  I personally taken a history, examined the patient, reviewed relevant notes,  laboratory data / imaging studies.  I performed a substantive portion of this encounter and formulated the important aspects of the plan.  I agree with the APP's note, impression, and recommendations; however, I have edited the note to reflect changes or salient points (which are noted in italics).   Patient is resting in bed comfortably. Denies anginal chest pain or heart failure symptoms. 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    PHYSICAL EXAM: Today's Vitals   12/19/22 0916 12/19/22 1101 12/19/22 1239 12/19/22 1322  BP: (!) 141/51 94/64 (!) 117/46 (!) 130/48  Pulse: 95 85 76 63  Resp:  17 17   Temp:   98.3 F (36.8 C)   TempSrc:   Oral   SpO2:   96%   Weight:      Height:      PainSc:       Body mass index is 20.69 kg/m.   Net IO Since Admission: 4,045.81 mL [12/19/22 1612]  Filed Weights   12/16/22 0500 12/17/22 0447 12/18/22 0418  Weight: 50.2 kg 50.4 kg 51.3 kg    Physical Exam  Constitutional: No distress.  hemodynamically stable  Neck: No JVD present.  Cardiovascular: Normal rate, regular rhythm, S1 normal and S2 normal. Exam reveals no gallop, no S3 and no S4.  Murmur heard. High-pitched blowing holosystolic murmur is present with a grade of 3/6 at the apex. Pulmonary/Chest: Effort normal and breath sounds normal. No stridor. She has no wheezes. She has no rales.  Abdominal: Soft. Bowel sounds are normal. She exhibits no distension. There is no abdominal tenderness.  Musculoskeletal:        General: No edema.     Cervical back: Neck supple.  Neurological: She is alert and oriented to person, place, and time. She has intact cranial nerves (2-12).  Skin: Skin is warm.    EKG: (personally reviewed by me) No new EKG  Telemetry: (personally reviewed by me) Sinus rhythm with first-degree AV block   Impression:  Hypertrophic  obstructive cardiomyopathy. Mitral regurgitation. Acute HFpEF Paroxysmal atrial fibrillation. Long-term oral anticoagulation Hypertension. Hyperlipidemia. Former smoker History of peptic ulcer disease with upper GI bleed September 2023  Recommendations:  Hypertrophic obstructive cardiomyopathy Mitral regurgitation. Acute HFpEF Presented to the hospital with acute heart failure exacerbation. Was diuresed with parenteral medications and then experienced symptomatic hypotension. Has been on gentle hydration over the last 24 hours. Blood pressures have improved and clinically she is asymptomatic and overall euvolemic. Agree with restarting beta-blockers and calcium channel blockers at a lower dose as discussed above. Avoid dehydration/excessive diuresis. Recommend using Lasix on as needed basis as discussed above. Echocardiogram noted preserved LVEF, grade 3 diastolic  dysfunction, moderate MR. Encouraged her to follow-up with Dr. Jacinto Halim who is her primary cardiologist as well as Dr. Raynelle Jan at the [facility] clinic.  Mitral regurgitation: At least moderate on recent echocardiography. Secondary to underlying HOCM Monitor for now  Paroxysmal atrial fibrillation: Long-term oral anticoagulation: Remains in normal sinus rhythm. Continue AV nodal blocking agents as discussed above. Currently on anticoagulation for thromboembolic prophylaxis. Does not endorse evidence of bleeding.  But does have a history of peptic ulcer disease with upper GI bleed as of September 2023. Monitor her H&H  Recommendations conveyed to attending physician. Outpatient follow-up scheduled with APP in January 2025 discharge paperwork updated   This note was created using a voice recognition software as a result there may be grammatical errors inadvertently enclosed that do not reflect the nature of this encounter. Every attempt is made to correct such errors.   Tessa Lerner, DO, [facility]   [address] [place], [place] [address] Pager: [phone] Office: [phone] 12/19/2022 4:12 PM

## 2022-12-19 NOTE — Progress Notes (Signed)
Mobility Specialist - Progress Note  Post-mobility: 85 bpm HR, 94/64 mmHg (74 MAP) BP   12/19/22 1101  Mobility  Activity Ambulated independently in hallway  Level of Assistance Contact guard assist, steadying assist  Assistive Device None  Distance Ambulated (ft) 350 ft  Range of Motion/Exercises Active  Activity Response Tolerated fair  Mobility Referral Yes  $Mobility charge 1 Mobility  Mobility Specialist Start Time (ACUTE ONLY) 1050  Mobility Specialist Stop Time (ACUTE ONLY) 1101  Mobility Specialist Time Calculation (min) (ACUTE ONLY) 11 min   Pt was found in bed and agreeable to ambulate. Stated feeling a little dizzy at about the last 25 ft of ambulation. BP was taken after ambulation and recorded above. Pt was left in bed with all needs met. Call bell in reach.  Billey Chang Mobility Specialist

## 2022-12-19 NOTE — Plan of Care (Signed)
  Problem: Pain Management: Goal: General experience of comfort will improve Outcome: Progressing   Problem: Safety: Goal: Ability to remain free from injury will improve Outcome: Progressing   Problem: Activity: Goal: Ability to tolerate increased activity will improve Outcome: Progressing

## 2022-12-19 NOTE — Progress Notes (Signed)
AVS reviewed w/ pt who verbalized an understanding. PIV removed by primary RN. Pt dressed for d/c - waiting on her son-in-law to pick her up between 1800 - 1830. No other questions at this time

## 2022-12-19 NOTE — Discharge Summary (Addendum)
Physician Discharge Summary   Patient: Kelly [name] MRN: [id number] DOB: [birth year]  Admit date:     12/12/2022  Discharge date: 12/19/22  Discharge Physician: Alba Cory   PCP: Trisha Mangle, FNP   Recommendations at discharge:    Needs to follow up with Dr Francesca Jewett HCM clinic  Monitor BP Monitor volume status.  Needs follow up CT chest to follow on Ground glass opacity   Discharge Diagnoses: Principal Problem:   Hypoxia Active Problems:   Anemia, iron deficiency   Severe concentric left ventricular hypertrophy   Paroxysmal atrial fibrillation (HCC)   AKI (acute kidney injury) (HCC)   CAP (community acquired pneumonia)  Resolved Problems:   * No resolved hospital problems. [facility] Course: 82 year old with past medical history significant for COPD, heart failure presents with shortness of breath found to have pneumonia respiratory failure, also in the differential heart failure    Assessment and Plan: 1-Acute hypoxic respiratory failure Community-acquired pneumonia without evidence of sepsis  Interstitial pulmonary edema -Chest x ray improved edema.  -Spike fever 11/27. Change ceftriaxone to cefepime./  -Repeat blood culture: No growth to date.  -Cardiology consulted for HF -Continue nebulizer.  -Treated with IV lasix.  Stable.    Hypotension:  Patient develops Hypotension in setting of diuresis, HCM, medication. , SBP drops to 84 on 11/29. Improved with fluids Cardizem and metoprolol were held Orthostatic vitals positive. Received IV bolus.  BP improved today, resume lower dose cardizem and metoprolol. SBP 130.  Stable for discharge    AKI: Resolved.  Cr down to 1.0 form 1.4   COPD:Continue with Incruse, Breo. Albuterol.    Paroxysmal A-fib: Continue with Cardizem and eliquis.    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 Cardiology consulted.  -received IV  lasix during this hospitalization.  Hold lasix. SBP soft.   Lasix PRN  Blood culture positive for Staph Hominis.  Repeat Blood culture. No growth to date.    Iron deficiency anemia: Continue with ferrous sulfate.    Hyperlipidemia: On Crestor.    Depression: Continue with remeron.    Groundglass opacity: Needs follow up CT chest        Estimated body mass index is 20.69 kg/m as calculated from the following:   Height as of this encounter: 5\' 2"  (1.575 m).   Weight as of this encounter: 51.3 kg.          Consultants: Cardiology  Procedures performed: None Disposition: Home Diet recommendation:  Discharge Diet Orders (From admission, onward)     Start     Ordered   12/19/22 0000  Diet - low sodium heart healthy        12/19/22 1607   12/16/22 0000  Diet - low sodium heart healthy        12/16/22 1220           Cardiac diet DISCHARGE MEDICATION: Allergies as of 12/19/2022       Reactions   Bee Venom Anaphylaxis   Sulfamethoxazole-trimethoprim Diarrhea, Nausea And Vomiting, Other (See Comments)   GI Intolerance   Trazodone Other (See Comments)   Severe weakness, "unable to get up, and fatigue        Medication List     TAKE these medications    albuterol 108 (90 Base) MCG/ACT inhaler Commonly known as: VENTOLIN HFA Inhale 2 puffs into the lungs every 6 (six) hours as needed for wheezing or shortness of breath.   Artificial Tears ophthalmic  solution Place 1 drop into both eyes 3 (three) times daily as needed (for dryness).   carboxymethylcellulose 0.5 % Soln Commonly known as: REFRESH PLUS Place 1 drop into both eyes 3 (three) times daily as needed (dry eyes).   diltiazem 180 MG 24 hr capsule Commonly known as: CARDIZEM CD Take 1 capsule (180 mg total) by mouth daily. Start taking on: December 20, 2022 What changed:  medication strength how much to take   DULoxetine 60 MG capsule Commonly known as: CYMBALTA Take 60 mg by mouth daily.    Eliquis 2.5 MG Tabs tablet Generic drug: apixaban TAKE 1 TABLET(2.5 MG) BY MOUTH TWICE DAILY What changed: See the new instructions.   EPINEPHrine 0.3 mg/0.3 mL Soaj injection Commonly known as: EPI-PEN Inject 0.3 mg into the muscle as needed for anaphylaxis.   ferrous sulfate 325 (65 FE) MG EC tablet Take 1 tablet (325 mg total) by mouth daily with breakfast.   folic acid 1 MG tablet Commonly known as: FOLVITE Take 1 mg by mouth daily.   furosemide 20 MG tablet Commonly known as: LASIX Take 1 tablet (20 mg total) by mouth daily as needed (for weight gain of 3 pounds.).   levofloxacin 500 MG tablet Commonly known as: LEVAQUIN Take 1 tablet (500 mg total) by mouth daily for 2 days.   metoprolol succinate 25 MG 24 hr tablet Commonly known as: TOPROL-XL Take 1 tablet (25 mg total) by mouth daily.   mirtazapine 7.5 MG tablet Commonly known as: REMERON Take 7.5 mg by mouth at bedtime.   nicotine 21 mg/24hr patch Commonly known as: NICODERM CQ - dosed in mg/24 hours Place 1 patch (21 mg total) onto the skin daily.   ondansetron 4 MG tablet Commonly known as: ZOFRAN Take 4 mg by mouth every 8 (eight) hours as needed for nausea.   OXYGEN Inhale 1-3 L/min into the lungs as needed (shortness of breath).   pantoprazole 40 MG tablet Commonly known as: PROTONIX Take 40 mg by mouth daily.   pregabalin 75 MG capsule Commonly known as: LYRICA Take 75 mg by mouth 3 (three) times daily.   rosuvastatin 40 MG tablet Commonly known as: CRESTOR Take 40 mg by mouth daily.   Spiriva HandiHaler 18 MCG inhalation capsule Generic drug: tiotropium Place 18 mcg into inhaler and inhale daily as needed (for flares).   Trelegy Ellipta 100-62.5-25 MCG/ACT Aepb Generic drug: Fluticasone-Umeclidin-Vilant Inhale 1 puff into the lungs daily.   Tylenol 325 MG tablet Generic drug: acetaminophen Take 975 mg by mouth every 8 (eight) hours as needed for headache or moderate pain (pain score  4-6).   Vitamin D3 50 MCG (2000 UT) Tabs Take 2,000 Units by mouth daily.   Vortex Valved [employer] by Does not apply route.        Follow-up Information     Sharlene Dory, PA-C Follow up.   Specialty: Cardiology Why: Thursday Jan 19, 2023 Arrive by 9:50 AMAppt at 10:05 AM (25 min) Contact information: [address] [place] [address] [phone]         Trisha Mangle, FNP Follow up in 1 week(s).   Specialty: Family Medicine Contact information: [address] [place] [address] [phone]                Discharge Exam: Filed Weights   12/16/22 0500 12/17/22 0447 12/18/22 0418  Weight: 50.2 kg 50.4 kg 51.3 kg   General; NAD  Condition at  discharge: stable  The results of significant diagnostics from this hospitalization (including imaging, microbiology, ancillary and laboratory) are listed below for reference.   Imaging Studies: DG CHEST PORT 1 VIEW  Result Date: 12/14/2022 CLINICAL DATA:  Shortness of breath EXAM: PORTABLE CHEST 1 VIEW COMPARISON:  12/12/2022 FINDINGS: Enlarged cardiopericardial silhouette. Calcified aorta. Decreasing interstitial edema with some residual. Small left effusion. No pneumothorax or consolidation overlapping cardiac leads. Film is rotated to the left. IMPRESSION: Decreasing edema with some residual. Electronically Signed   By: Karen Kays M.D.   On: 12/14/2022 10:59   ECHOCARDIOGRAM COMPLETE  Result Date: 12/13/2022    ECHOCARDIOGRAM REPORT   Patient Name:   Adyline Petronio Date of Exam: 12/13/2022 Medical Rec #:  [id number]      Height:       62.0 in Accession #:    [id number]     Weight:       105.4 lb Date of Birth:  1940/08/06      BSA:          1.456 m Patient Age:    82 years       BP:           138/68 mmHg Patient Gender: F              HR:           99 bpm. Exam Location:  Inpatient Procedure: 2D Echo, Color Doppler and Cardiac Doppler Indications:    CHF-Acute Diastolic I50.31   History:        Patient has prior history of Echocardiogram examinations, most                 recent 02/28/2021. CHF, COPD; Signs/Symptoms:Murmur.  Sonographer:    Darlys Gales Referring Phys: [id number] [facility] GOEL IMPRESSIONS  1. There is a severe dynamic LVOT obstruction from concentric hypertrophy and mitral valve SAM. Peak gradient with valsalva. Left ventricular ejection fraction, by estimation, is 65 to 70%. The left ventricle has normal function. The left ventricle has no regional wall motion abnormalities. There is severe concentric left ventricular hypertrophy. Left ventricular diastolic parameters are consistent with Grade III diastolic dysfunction (restrictive). Elevated left atrial pressure.  2. Right ventricular systolic function is normal. The right ventricular size is normal.  3. Left atrial size was severely dilated.  4. The mitral valve is normal in structure. Moderate mitral valve regurgitation.  5. The aortic valve is normal in structure. Aortic valve regurgitation is not visualized. FINDINGS  Left Ventricle: There is a severe dynamic LVOT obstruction from concentric hypertrophy and mitral valve SAM. Peak gradient with valsalva. Left ventricular ejection fraction, by estimation, is 65 to 70%. The left ventricle has normal function. The left ventricle has no regional wall motion abnormalities. The left ventricular internal cavity size was small. There is severe concentric left ventricular hypertrophy. Left ventricular diastolic parameters are consistent with Grade III diastolic dysfunction (restrictive). Elevated left atrial pressure. Right Ventricle: The right ventricular size is normal. No increase in right ventricular wall thickness. Right ventricular systolic function is normal. Left Atrium: Left atrial size was severely dilated. Right Atrium: Right atrial size was normal in size. Pericardium: Trivial pericardial effusion is present. Mitral Valve: The mitral valve is normal in  structure. There is mild thickening of the mitral valve leaflet(s). Mild mitral annular calcification. Moderate mitral valve regurgitation. Tricuspid Valve: The tricuspid valve is normal in structure. Tricuspid valve regurgitation is mild. Aortic Valve: The aortic valve is normal in structure.  Aortic valve regurgitation is not visualized. Pulmonic Valve: The pulmonic valve was grossly normal. Pulmonic valve regurgitation is not visualized. Aorta: The aortic root and ascending aorta are structurally normal, with no evidence of dilitation. IAS/Shunts: No atrial level shunt detected by color flow Doppler.  LEFT VENTRICLE PLAX 2D LVIDd:         3.50 cm   Diastology LVIDs:         2.00 cm   LV e' medial:    6.74 cm/s LV PW:         1.40 cm   LV E/e' medial:  37.8 LV IVS:        1.80 cm   LV e' lateral:   13.10 cm/s LVOT diam:     1.80 cm   LV E/e' lateral: 19.5 LVOT Area:     2.54 cm  LEFT ATRIUM             Index        RIGHT ATRIUM           Index LA Vol (A2C):   88.5 ml 60.78 ml/m  RA Area:     12.60 cm LA Vol (A4C):   42.4 ml 29.12 ml/m  RA Volume:   23.50 ml  16.14 ml/m LA Biplane Vol: 65.4 ml 44.92 ml/m   AORTA Ao Root diam: 3.30 cm Ao Asc diam:  3.50 cm MITRAL VALVE MV Area (PHT): 3.54 cm     SHUNTS MV Decel Time: 214 msec     Systemic Diam: 1.80 cm MV E velocity: 255.00 cm/s Clearnce Hasten Electronically signed by Clearnce Hasten Signature Date/Time: 12/13/2022/9:44:34 AM    Final    CT CHEST WO CONTRAST  Result Date: 12/12/2022 CLINICAL DATA:  Shortness of breath. EXAM: CT CHEST WITHOUT CONTRAST TECHNIQUE: Multidetector CT imaging of the chest was performed following the standard protocol without IV contrast. RADIATION DOSE REDUCTION: This exam was performed according to the departmental dose-optimization program which includes automated exposure control, adjustment of the mA and/or kV according to patient size and/or use of iterative reconstruction technique. COMPARISON:  Chest CT 02/18/2021  FINDINGS: Cardiovascular: Heart is enlarged. There is no pericardial effusion. The aorta is normal in size. There are atherosclerotic calcifications of the aorta and coronary arteries. Mediastinum/Nodes: No enlarged mediastinal or axillary lymph nodes. Thyroid gland, trachea, and esophagus demonstrate no significant findings. Lungs/Pleura: There is a trace right pleural effusion. There is smooth interlobular septal thickening in the lung apices and lung bases. There is a small amount of airspace consolidation with air bronchograms in the right lower lobe. Atelectatic changes are seen in the lingula and medial aspect of the right middle lobe. There are multifocal ill-defined ground-glass and nodular ground-glass opacities scattered throughout both lungs. The largest area is in the right upper lobe measuring 2.1 x 1.5 cm image 4/58. There is no evidence for pneumothorax. Solid nodule noted in the left lower lobe measuring 5 mm image 4/89. Upper Abdomen: No acute abnormality. Musculoskeletal: No chest wall mass or suspicious bone lesions identified. IMPRESSION: 1. Cardiomegaly with trace right pleural effusion and smooth interlobular septal thickening compatible with pulmonary edema. 2. Small amount of airspace consolidation in the right lower lobe worrisome for pneumonia. 3. Multifocal ill-defined ground-glass and nodular ground-glass opacities scattered throughout both lungs. Findings may be infectious/inflammatory, but neoplasm is not excluded. Follow-up CT recommended in 3 months to re-evaluate. 4. Left solid pulmonary nodule measuring 5 mm. Per Fleischner Society Guidelines, no routine follow-up imaging is recommended. These  guidelines do not apply to immunocompromised patients and patients with cancer. Follow up in patients with significant comorbidities as clinically warranted. For lung cancer screening, adhere to Lung-RADS guidelines. Reference: Radiology. 2017; 284(1):228-43. 5. Aortic Atherosclerosis  (ICD10-I70.0). Electronically Signed   By: Darliss Cheney M.D.   On: 12/12/2022 21:07   DG Chest Port 1 View  Result Date: 12/12/2022 CLINICAL DATA:  Shortness of breath EXAM: PORTABLE CHEST 1 VIEW COMPARISON:  X-ray 12/12/2021 and older. FINDINGS: Calcified aorta. Enlarged cardiopericardial silhouette. Hyperinflation with some interstitial changes including some Kerley B lines consistent with some interstitial edema. Question tiny pleural effusions. No pneumothorax overlapping cardiac leads. Film is under penetrated. IMPRESSION: Enlarged heart with some interstitial edema.  Calcified aorta. Under penetrated radiograph Electronically Signed   By: Karen Kays M.D.   On: 12/12/2022 15:54    Microbiology: Results for orders placed or performed during the hospital encounter of 12/12/22  Resp panel by RT-PCR (RSV, Flu A&B, Covid) Anterior Nasal Swab     Status: None   Collection Time: 12/12/22  2:58 PM   Specimen: Anterior Nasal Swab  Result Value Ref Range Status   SARS Coronavirus 2 by RT PCR NEGATIVE NEGATIVE Final    Comment: (NOTE) SARS-CoV-2 target nucleic acids are NOT DETECTED.  The SARS-CoV-2 RNA is generally detectable in upper respiratory specimens during the acute phase of infection. The lowest concentration of SARS-CoV-2 viral copies this assay can detect is 138 copies/mL. A negative result does not preclude SARS-Cov-2 infection and should not be used as the sole basis for treatment or other patient management decisions. A negative result may occur with  improper specimen collection/handling, submission of specimen other than nasopharyngeal swab, presence of viral mutation(s) within the areas targeted by this assay, and inadequate number of viral copies(<138 copies/mL). A negative result must be combined with clinical observations, patient history, and epidemiological information. The expected result is Negative.  Fact Sheet for Patients:   BloggerCourse.com  Fact Sheet for Healthcare Providers:  SeriousBroker.it  This test is no t yet approved or cleared by the [place] FDA and  has been authorized for detection and/or diagnosis of SARS-CoV-2 by FDA under an Emergency Use Authorization (EUA). This EUA will remain  in effect (meaning this test can be used) for the duration of the COVID-19 declaration under Section 564(b)(1) of the Act, 21 U.S.C.section 360bbb-3(b)(1), unless the authorization is terminated  or revoked sooner.       Influenza A by PCR NEGATIVE NEGATIVE Final   Influenza B by PCR NEGATIVE NEGATIVE Final    Comment: (NOTE) The Xpert Xpress SARS-CoV-2/FLU/RSV plus assay is intended as an aid in the diagnosis of influenza from Nasopharyngeal swab specimens and should not be used as a sole basis for treatment. Nasal washings and aspirates are unacceptable for Xpert Xpress SARS-CoV-2/FLU/RSV testing.  Fact Sheet for Patients: BloggerCourse.com  Fact Sheet for Healthcare Providers: SeriousBroker.it  This test is not yet approved or cleared by the [place] FDA and has been authorized for detection and/or diagnosis of SARS-CoV-2 by FDA under an Emergency Use Authorization (EUA). This EUA will remain in effect (meaning this test can be used) for the duration of the COVID-19 declaration under Section 564(b)(1) of the Act, 21 U.S.C. section 360bbb-3(b)(1), unless the authorization is terminated or revoked.     Resp Syncytial Virus by PCR NEGATIVE NEGATIVE Final    Comment: (NOTE) Fact Sheet for Patients: BloggerCourse.com  Fact Sheet for Healthcare Providers: SeriousBroker.it  This test is not yet  approved or cleared by the [place] and has been authorized for detection and/or diagnosis of SARS-CoV-2 by FDA under an Emergency Use  Authorization (EUA). This EUA will remain in effect (meaning this test can be used) for the duration of the COVID-19 declaration under Section 564(b)(1) of the Act, 21 U.S.C. section 360bbb-3(b)(1), unless the authorization is terminated or revoked.  Performed at [facility], [address] [place], [place] [address]   Culture, blood (Routine X 2) w Reflex to ID Panel     Status: Abnormal   Collection Time: 12/12/22  8:09 PM   Specimen: BLOOD LEFT FOREARM  Result Value Ref Range Status   Specimen Description   Final    BLOOD LEFT FOREARM Performed at [facility], [address], [place], [place] [address]    Special Requests   Final    BOTTLES DRAWN AEROBIC AND ANAEROBIC Blood Culture adequate volume Performed at [facility], [address], [place], [place] [address]    Culture  Setup Time   Final    GRAM POSITIVE COCCI ANAEROBIC BOTTLE ONLY CRITICAL RESULT CALLED TO, READ BACK BY AND VERIFIED WITH: PHARMD D. WOFFORD [id number] @ 1807 FH GRAM POSITIVE RODS AEROBIC BOTTLE ONLY CRITICAL RESULT CALLED TO, READ BACK BY AND VERIFIED WITH: PHARMD E. JACKSON 12/16/22 @ 0304 BY AB    Culture (A)  Final    STAPHYLOCOCCUS HOMINIS THE SIGNIFICANCE OF ISOLATING THIS ORGANISM FROM A SINGLE SET OF BLOOD CULTURES WHEN MULTIPLE SETS ARE DRAWN IS UNCERTAIN. PLEASE NOTIFY THE MICROBIOLOGY DEPARTMENT WITHIN ONE WEEK IF SPECIATION AND SENSITIVITIES ARE REQUIRED. CORYNEBACTERIUM PSEUDODIPHTHERITICUM Standardized susceptibility testing for this organism is not available. Performed at [facility], [address] [place], [place] [address]    Report Status 12/19/2022 FINAL  Final  Blood Culture ID Panel (Reflexed)     Status: Abnormal   Collection Time: 12/12/22  8:09 PM  Result Value Ref Range Status   Enterococcus faecalis NOT DETECTED NOT DETECTED Final   Enterococcus Faecium NOT DETECTED NOT DETECTED Final   Listeria monocytogenes NOT  DETECTED NOT DETECTED Final   Staphylococcus species DETECTED (A) NOT DETECTED Final    Comment: CRITICAL RESULT CALLED TO, READ BACK BY AND VERIFIED WITH: PHARMD D. [id number] @ 1807 FH    Staphylococcus aureus (BCID) NOT DETECTED NOT DETECTED Final   Staphylococcus epidermidis NOT DETECTED NOT DETECTED Final   Staphylococcus lugdunensis NOT DETECTED NOT DETECTED Final   Streptococcus species NOT DETECTED NOT DETECTED Final   Streptococcus agalactiae NOT DETECTED NOT DETECTED Final   Streptococcus pneumoniae NOT DETECTED NOT DETECTED Final   Streptococcus pyogenes NOT DETECTED NOT DETECTED Final   A.calcoaceticus-baumannii NOT DETECTED NOT DETECTED Final   Bacteroides fragilis NOT DETECTED NOT DETECTED Final   Enterobacterales NOT DETECTED NOT DETECTED Final   Enterobacter cloacae complex NOT DETECTED NOT DETECTED Final   Escherichia coli NOT DETECTED NOT DETECTED Final   Klebsiella aerogenes NOT DETECTED NOT DETECTED Final   Klebsiella oxytoca NOT DETECTED NOT DETECTED Final   Klebsiella pneumoniae NOT DETECTED NOT DETECTED Final   Proteus species NOT DETECTED NOT DETECTED Final   Salmonella species NOT DETECTED NOT DETECTED Final   Serratia marcescens NOT DETECTED NOT DETECTED Final   Haemophilus influenzae NOT DETECTED NOT DETECTED Final   Neisseria meningitidis NOT DETECTED NOT DETECTED Final   Pseudomonas aeruginosa NOT DETECTED NOT DETECTED Final   Stenotrophomonas maltophilia NOT DETECTED NOT DETECTED Final   Candida albicans  NOT DETECTED NOT DETECTED Final   Candida auris NOT DETECTED NOT DETECTED Final   Candida glabrata NOT DETECTED NOT DETECTED Final   Candida krusei NOT DETECTED NOT DETECTED Final   Candida parapsilosis NOT DETECTED NOT DETECTED Final   Candida tropicalis NOT DETECTED NOT DETECTED Final   Cryptococcus neoformans/gattii NOT DETECTED NOT DETECTED Final    Comment: Performed at [facility], [address], [place], [place] [address]  Culture,  blood (Routine X 2) w Reflex to ID Panel     Status: None   Collection Time: 12/12/22  8:20 PM   Specimen: BLOOD  Result Value Ref Range Status   Specimen Description   Final    BLOOD BLOOD LEFT HAND Performed at [facility], [address], [place], [place] [address]    Special Requests   Final    BOTTLES DRAWN AEROBIC AND ANAEROBIC Blood Culture results may not be optimal due to an inadequate volume of blood received in culture bottles Performed at [facility], [address], [place], [place] [address]    Culture   Final    NO GROWTH 5 DAYS Performed at [facility], [address] [place], [place] [address]    Report Status 12/17/2022 FINAL  Final  Respiratory (~20 pathogens) panel by PCR     Status: None   Collection Time: 12/13/22  9:15 AM   Specimen: Nasopharyngeal Swab; Respiratory  Result Value Ref Range Status   Adenovirus NOT DETECTED NOT DETECTED Final   Coronavirus 229E NOT DETECTED NOT DETECTED Final    Comment: (NOTE) The Coronavirus on the Respiratory Panel, DOES NOT test for the novel  Coronavirus (2019 nCoV)    Coronavirus HKU1 NOT DETECTED NOT DETECTED Final   Coronavirus NL63 NOT DETECTED NOT DETECTED Final   Coronavirus OC43 NOT DETECTED NOT DETECTED Final   Metapneumovirus NOT DETECTED NOT DETECTED Final   Rhinovirus / Enterovirus NOT DETECTED NOT DETECTED Final   Influenza A NOT DETECTED NOT DETECTED Final   Influenza B NOT DETECTED NOT DETECTED Final   Parainfluenza Virus 1 NOT DETECTED NOT DETECTED Final   Parainfluenza Virus 2 NOT DETECTED NOT DETECTED Final   Parainfluenza Virus 3 NOT DETECTED NOT DETECTED Final   Parainfluenza Virus 4 NOT DETECTED NOT DETECTED Final   Respiratory Syncytial Virus NOT DETECTED NOT DETECTED Final   Bordetella pertussis NOT DETECTED NOT DETECTED Final   Bordetella Parapertussis NOT DETECTED NOT DETECTED Final   Chlamydophila pneumoniae NOT DETECTED NOT DETECTED Final    Mycoplasma pneumoniae NOT DETECTED NOT DETECTED Final    Comment: Performed at [facility], [address] [place], [place] [address]  Culture, blood (Routine X 2) w Reflex to ID Panel     Status: None   Collection Time: 12/14/22  1:48 PM   Specimen: BLOOD RIGHT HAND  Result Value Ref Range Status   Specimen Description   Final    BLOOD RIGHT HAND Performed at [facility], [address], [place], [place] [address]    Special Requests   Final    BOTTLES DRAWN AEROBIC AND ANAEROBIC Blood Culture results may not be optimal due to an inadequate volume of blood received in culture bottles Performed at [facility], [address] [place], [place] [address]    Culture   Final    NO GROWTH 5 DAYS Performed at [facility], [address], [place], [place] [address]  Report Status 12/19/2022 FINAL  Final  Culture, blood (Routine X 2) w Reflex to ID Panel     Status: None   Collection Time: 12/14/22  1:53 PM   Specimen: BLOOD RIGHT HAND  Result Value Ref Range Status   Specimen Description   Final    BLOOD RIGHT HAND Performed at [facility], [address], [place], [place] [address]    Special Requests   Final    BOTTLES DRAWN AEROBIC ONLY Blood Culture results may not be optimal due to an inadequate volume of blood received in culture bottles Performed at [facility], [address], [place], [place] [address]    Culture   Final    NO GROWTH 5 DAYS Performed at [facility], [address], [place], [place] [address]    Report Status 12/19/2022 FINAL  Final    Labs: CBC: Recent Labs  Lab 12/12/22 1845 12/13/22 9147 12/14/22 0427 12/15/22 0741 12/18/22 0741  WBC 13.0* 7.1 15.9* 9.5 7.0  HGB 7.5* 8.4* 9.0* 8.7* 8.2*  HCT 25.4* 27.7* 30.1* 28.5* 27.0*  MCV 105.8* 102.6* 104.9* 103.6* 101.1*  PLT 301 215 235 219 173   Basic Metabolic Panel: Recent Labs  Lab 12/13/22 0633 12/14/22 0427  12/14/22 1348 12/15/22 0438 12/16/22 0419 12/18/22 0741 12/19/22 0409  NA 139 143  --  139 141 137  --   K 4.2 4.1  --  3.8 4.0 4.0  --   CL 110 112*  --  108 110 108  --   CO2 20* 21*  --  22 23 24   --   GLUCOSE 174* 134*  --  125* 110* 106*  --   BUN 29* 27*  --  20 18 22   --   CREATININE 1.09* 1.00  --  0.93 0.92 0.87 0.82  CALCIUM 8.5* 8.2*  --  8.1* 8.4* 8.3*  --   MG  --   --  2.2  --   --   --   --    Liver Function Tests: Recent Labs  Lab 12/12/22 2306  AST 25  ALT 17  ALKPHOS 51  BILITOT 0.3  PROT 6.5  ALBUMIN 3.6   CBG: No results for input(s): "GLUCAP" in the last 168 hours.  Discharge time spent: greater than 30 minutes.  Signed: Alba Cory, MD [facility] 12/19/2022

## 2022-12-19 NOTE — Plan of Care (Signed)
  Problem: Education: Goal: Knowledge of General Education information will improve Description: Including pain rating scale, medication(s)/side effects and non-pharmacologic comfort measures Outcome: Completed/Met   Problem: Health Behavior/Discharge Planning: Goal: Ability to manage health-related needs will improve Outcome: Completed/Met   Problem: Clinical Measurements: Goal: Ability to maintain clinical measurements within normal limits will improve Outcome: Completed/Met Goal: Will remain free from infection Outcome: Completed/Met Goal: Diagnostic test results will improve Outcome: Completed/Met Goal: Respiratory complications will improve Outcome: Completed/Met Goal: Cardiovascular complication will be avoided Outcome: Completed/Met   Problem: Activity: Goal: Risk for activity intolerance will decrease Outcome: Completed/Met   Problem: Nutrition: Goal: Adequate nutrition will be maintained Outcome: Completed/Met   Problem: Coping: Goal: Level of anxiety will decrease Outcome: Completed/Met   Problem: Elimination: Goal: Will not experience complications related to bowel motility Outcome: Completed/Met Goal: Will not experience complications related to urinary retention Outcome: Completed/Met   Problem: Pain Management: Goal: General experience of comfort will improve Outcome: Completed/Met   Problem: Safety: Goal: Ability to remain free from injury will improve Outcome: Completed/Met   Problem: Skin Integrity: Goal: Risk for impaired skin integrity will decrease Outcome: Completed/Met   Problem: Activity: Goal: Ability to tolerate increased activity will improve Outcome: Completed/Met   Problem: Clinical Measurements: Goal: Ability to maintain a body temperature in the normal range will improve Outcome: Completed/Met   Problem: Respiratory: Goal: Ability to maintain adequate ventilation will improve Outcome: Completed/Met Goal: Ability to maintain a  clear airway will improve Outcome: Completed/Met

## 2022-12-28 ENCOUNTER — Encounter: Payer: Self-pay | Admitting: Gastroenterology

## 2023-01-18 NOTE — Progress Notes (Signed)
 Cardiology Office Note:  .   Date:  01/19/2023  ID:  Kelly [name], DOB 04-11-1940, MRN [id number] PCP: Jesus Elberta Gainer, FNP  [facility] Providers Cardiologist:  Gordy Bergamo, MD {  History of Present Illness: .   Kelly [name] is a 83 y.o. female with a past medical history of paroxysmal atrial fibrillation on chronic Eliquis  therapy, hypertension, hyperlipidemia, chronic back pain and lumbar stenosis with radiculopathy, ongoing tobacco use disorder with COPD, excessive alcohol  drinking, PUD with UGIB on 09/21/2021 here for follow-up appointment.  Also presenting with left central retinal artery occlusion and acute left-sided vision loss and underwent left carotid endarterectomy on 08/04/2021 at [facility].  She was admitted again at [facility] emergency room with hypoxemia respiratory failure, recurrent GI bleed and received blood transfusions on 02/06/2022.  EGD revealed nonbleeding superficial duodenal ulcer and gastritis.  Since she established with cardiology she stopped taking NSAIDs and already quit smoking and drinking heavily but now admits that she has been smoking about 2 to 3 cigarettes a week and maybe 2 to 3 glasses of wine a month at most.  Overall feeling well.  States that since she started her iron  infusions her energy level has been improved.  Trying her best to take care of herself at her last appointment April 2024.  No recurrent dark stools.  Back on Eliquis .  Today, she presents with a history of hypertrophic cardiomyopathy and carotid artery stenosis, presents with worsening tinnitus that has been present for many years. The tinnitus is now interfering with sleep and is described as a constant ringing in the ears. The patient also reports occasional shortness of breath, particularly during physical activity such as making the bed or taking a walk outside. The shortness of breath has been stable over the last few years and does not appear  to be worsening. The patient also has a history of GI bleeding, which has not recurred recently. The patient is currently taking Crestor  for cholesterol management, Eliquis  for anticoagulation, and iron  supplements for anemia.   Reports no chest pain, pressure, or tightness. No edema, orthopnea, PND. Reports no palpitations.   Discussed the use of AI scribe software for clinical note transcription with the patient, who gave verbal consent to proceed.  ROS: pertinent ROS in HPI  Studies Reviewed: SABRA   EKG Interpretation Date/Time:  Thursday January 19 2023 09:56:12 EST Ventricular Rate:  64 PR Interval:  244 QRS Duration:  84 QT Interval:  428 QTC Calculation: 441 R Axis:   80  Text Interpretation: Sinus rhythm with 1st degree A-V block with occasional Premature ventricular complexes Left ventricular hypertrophy with repolarization abnormality ( Sokolow-Lyon ) When compared with ECG of 12-Dec-2022 14:29, PREVIOUS ECG IS PRESENT Confirmed by Lucien Blanc [phone]) on 01/19/2023 10:05:07 AM    PCV MYOCARDIAL PERFUSION WITH LEXISCAN 06/22/2020 Lexiscan nuclear stress test performed using 1-day protocol. Normal myocardial perfusion. Stress LVEF calculated 44%, but visually appears normal. Low risk study.   .Echocardiogram 07/29/2021:   NORMAL LEFT VENTRICULAR SYSTOLIC FUNCTION WITH SEVERE LVH. Hyperdynamic left ventricular systolic function >70% with severe left    ventricular hypertrophy - there is systolic anterior motion of the mitral  valve leaflet - pattern consistent with hypertrophic obstruction CM.    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 Resting gradient of 50 mmHg.    - There is severe mitral valve regurgitation present with SAM of the mitral valve.  Compared to  office echocardiogram 07/06/2021, peak LVOT gradient was 89 mmHg and PA pressure was estimated at 41 mmHg.   Carotid artery duplex 04/13/2022 ([facility]): 1.  Moderate scattered atherosclerotic plaques within the bilateral carotid artery. 2. Evidence of hemodynamically significant stenosis in the LEFT internal carotid artery. 3. No evidence of hemodynamic stenosis in the RIGHT internal carotid artery. 4.  Compared to our study 10/13/2021, does not appear to have any significant change.  Study suggests restenosis in the left carotid endarterectomy        Physical Exam:   VS:  BP (!) 130/58   Pulse 61   Ht 5' 1 (1.549 m)   Wt 101 lb 9.6 oz (46.1 kg)   SpO2 95%   BMI 19.20 kg/m    Wt Readings from Last 3 Encounters:  01/19/23 101 lb 9.6 oz (46.1 kg)  12/18/22 113 lb 1.5 oz (51.3 kg)  05/16/22 100 lb 3.2 oz (45.5 kg)    GEN: Well nourished, well developed in no acute distress NECK: No JVD; No carotid bruits CARDIAC: RRR, 5/6 blowing systolic murmurs which radiates to the carotids, rubs, gallops RESPIRATORY:  Clear to auscultation without rales, wheezing or rhonchi  ABDOMEN: Soft, non-tender, non-distended EXTREMITIES:  No edema; No deformity   ASSESSMENT AND PLAN: .      Hypertrophic obstructive Cardiomyopathy (HOCM) Stable with no new symptoms. EKG shows left ventricular hypertrophy. Echocardiogram shows normal ejection fraction (65-70%) and diastolic dysfunction. Shortness of breath with exertion, stable over the years. -Continue current medications. -Order bilateral carotid ultrasound due to history of carotid artery stenosis. -Referral to Dr. Santo for HOCM management.  Bilateral Carotid stenosis -update carotid US   Tinnitus Chronic, worsening, and interfering with sleep. No recent ENT evaluation. -Send message to [facility] office for ENT referral.  Hyperlipidemia LDL 201, double the target level. Currently on Crestor  40mg  in the morning. -Switch Crestor  to bedtime dosing for better absorption. -Obtain lipid panel through primary care provider and send copy to our office. -May need to add zetia  at next  visit  Iron  Deficiency Anemia Hemoglobin 8.2, on iron  supplementation. -Continue iron  supplementation. -Obtain updated labs through primary care provider and send copy to our office.  Hypertension Well controlled on current medications, BP 130/58 today. -Continue current medications.  Alcohol  Use Daily wine consumption. Usually 1-2 glasses with dinner. Caution alcohol  use with Eliquis  and hx of GI bleed -Continue monitoring.  Smoking Cessation Quit smoking. -Continue abstinence.  Gastrointestinal Bleeding History of GI bleed due to ulcer, no recent issues. -Continue Eliquis  2.5mg  twice daily.  Pneumonia Recent hospitalization for pneumonia, resolved with IV antibiotics. -Continue current medications and monitor for recurrence.     Dispo: She can follow-up in a month with Dr. Santo and 6 months with Dr. Ladona  Signed, Orren LOISE Fabry, PA-C

## 2023-01-19 ENCOUNTER — Telehealth: Payer: Self-pay | Admitting: Cardiology

## 2023-01-19 ENCOUNTER — Encounter: Payer: Self-pay | Admitting: Physician Assistant

## 2023-01-19 ENCOUNTER — Ambulatory Visit: Payer: Medicare Other | Attending: Physician Assistant | Admitting: Physician Assistant

## 2023-01-19 VITALS — BP 130/58 | HR 61 | Ht 61.0 in | Wt 101.6 lb

## 2023-01-19 DIAGNOSIS — F17200 Nicotine dependence, unspecified, uncomplicated: Secondary | ICD-10-CM

## 2023-01-19 DIAGNOSIS — I421 Obstructive hypertrophic cardiomyopathy: Secondary | ICD-10-CM

## 2023-01-19 DIAGNOSIS — I10 Essential (primary) hypertension: Secondary | ICD-10-CM | POA: Diagnosis not present

## 2023-01-19 DIAGNOSIS — I480 Paroxysmal atrial fibrillation: Secondary | ICD-10-CM

## 2023-01-19 DIAGNOSIS — I6523 Occlusion and stenosis of bilateral carotid arteries: Secondary | ICD-10-CM

## 2023-01-19 DIAGNOSIS — I779 Disorder of arteries and arterioles, unspecified: Secondary | ICD-10-CM

## 2023-01-19 NOTE — Telephone Encounter (Signed)
 Patient's son in law is calling because the patient saw PA Orren Fabry. Patient schedule a Carotid test, but the patient's son in law stated the patient completed this test on 11/11/22 at [facility]. Patient's son in law would like to know if we can use those results instead of having the patient complete the test again. Please advise

## 2023-01-19 NOTE — Patient Instructions (Signed)
 Medication Instructions:  Your physician recommends that you continue on your current medications as directed. Please refer to the Current Medication list given to you today.  *If you need a refill on your cardiac medications before your next appointment, please call your pharmacy*   Lab Work: None ordered  If you have labs (blood work) drawn today and your tests are completely normal, you will receive your results only by: MyChart Message (if you have MyChart) OR A paper copy in the mail If you have any lab test that is abnormal or we need to change your treatment, we will call you to review the results.   Testing/Procedures: Your physician has requested that you have a carotid duplex. This test is an ultrasound of the carotid arteries in your neck. It looks at blood flow through these arteries that supply the brain with blood. Allow one hour for this exam. There are no restrictions or special instructions.    Follow-Up: At [facility], you and your health needs are our priority.  As part of our continuing mission to provide you with exceptional heart care, we have created designated Provider Care Teams.  These Care Teams include your primary Cardiologist (physician) and Advanced Practice Providers (APPs -  Physician Assistants and Nurse Practitioners) who all work together to provide you with the care you need, when you need it.  We recommend signing up for the patient portal called MyChart.  Sign up information is provided on this After Visit Summary.  MyChart is used to connect with patients for Virtual Visits (Telemedicine).  Patients are able to view lab/test results, encounter notes, upcoming appointments, etc.  Non-urgent messages can be sent to your provider as well.   To learn more about what you can do with MyChart, go to [email or web address].    Your next appointment:   6 month(s)  Provider:   Gordy Bergamo, MD     Other Instructions

## 2023-01-20 NOTE — Telephone Encounter (Signed)
 I was able to pull the carotid ultrasound results from [facility] back in October.  Moderate carotid stenosis on the left.  No carotid stenosis on the right.  We can go ahead and cancel the upcoming ultrasound and reschedule for 10/2023.   Thanks!  Orren LOISE Fabry, PA-C   Patient' son in law notified.  Appointment cancelled and new order placed for Carotid doppler to be done in October

## 2023-02-13 ENCOUNTER — Encounter (HOSPITAL_COMMUNITY): Payer: Medicare Other

## 2023-02-20 ENCOUNTER — Ambulatory Visit: Payer: Medicare Other | Attending: Internal Medicine | Admitting: Internal Medicine

## 2023-02-20 ENCOUNTER — Ambulatory Visit: Payer: Medicare Other | Admitting: Genetic Counselor

## 2023-02-20 ENCOUNTER — Ambulatory Visit (INDEPENDENT_AMBULATORY_CARE_PROVIDER_SITE_OTHER): Payer: Medicare Other

## 2023-02-20 VITALS — BP 118/56 | HR 47 | Ht 61.0 in | Wt 106.6 lb

## 2023-02-20 DIAGNOSIS — Z8241 Family history of sudden cardiac death: Secondary | ICD-10-CM

## 2023-02-20 DIAGNOSIS — I480 Paroxysmal atrial fibrillation: Secondary | ICD-10-CM

## 2023-02-20 DIAGNOSIS — I421 Obstructive hypertrophic cardiomyopathy: Secondary | ICD-10-CM

## 2023-02-20 DIAGNOSIS — R001 Bradycardia, unspecified: Secondary | ICD-10-CM | POA: Diagnosis not present

## 2023-02-20 DIAGNOSIS — R531 Weakness: Secondary | ICD-10-CM | POA: Diagnosis not present

## 2023-02-20 DIAGNOSIS — F17200 Nicotine dependence, unspecified, uncomplicated: Secondary | ICD-10-CM

## 2023-02-20 DIAGNOSIS — J449 Chronic obstructive pulmonary disease, unspecified: Secondary | ICD-10-CM

## 2023-02-20 NOTE — Patient Instructions (Signed)
Medication Instructions:  Your physician has recommended you make the following change in your medication:  STOP: metoprolol   *If you need a refill on your cardiac medications before your next appointment, please call your pharmacy*   Lab Work: Iron study, CBC If you have labs (blood work) drawn today and your tests are completely normal, you will receive your results only by: MyChart Message (if you have MyChart) OR A paper copy in the mail If you have any lab test that is abnormal or we need to change your treatment, we will call you to review the results.   Testing/Procedures: Your physician has recommended that you have a pulmonary function test. Pulmonary Function Tests are a group of tests that measure how well air moves in and out of your lungs.  Patient Instructions for Pulmonary Function Test  Do not smoke within at least 1 hour before the test. Do not consume Caffeine 4 hours prior to the test. Do not consume Alcohol within 4 hours prior to testing. Do not perform any Vigorous Exercise within 30 minutes before the test. Do not wear clothes that restrict the chest area or abdomen. Do not use albuterol or Xopenex 3 hours before the test or any other nebulizer medications or inhalers.    Follow-Up: At [facility], you and your health needs are our priority.  As part of our continuing mission to provide you with exceptional heart care, we have created designated Provider Care Teams.  These Care Teams include your primary Cardiologist (physician) and Advanced Practice Providers (APPs -  Physician Assistants and Nurse Practitioners) who all work together to provide you with the care you need, when you need it.  We recommend signing up for the patient portal called "MyChart".  Sign up information is provided on this After Visit Summary.  MyChart is used to connect with patients for Virtual Visits (Telemedicine).  Patients are able to view lab/test results, encounter notes,  upcoming appointments, etc.  Non-urgent messages can be sent to your provider as well.   To learn more about what you can do with MyChart, go to [email or web address].    Your next appointment:   2 month(s)  Provider:   Riley Lam, MD  Other Instructions Kelly [name]- Long Term Monitor Instructions  Your physician has requested you wear a ZIO patch monitor for 14 days.  This is a single patch monitor. [employer] supplies one patch monitor per enrollment. Additional stickers are not available. Please do not apply patch if you will be having a Nuclear Stress Test,   Cardiac CT, MRI, or Chest Xray during the period you would be wearing the  monitor. The patch cannot be worn during these tests. You cannot remove and re-apply the  ZIO XT patch monitor.  Your ZIO patch monitor will be mailed 3 day USPS to your address on file. It may take 3-5 days  to receive your monitor after you have been enrolled.  Once you have received your monitor, please review the enclosed instructions. Your monitor  has already been registered assigning a specific monitor serial # to you.  Billing and Patient Assistance Program Information  We have supplied [employer] with any of your insurance information on file for billing purposes. [employer] offers a sliding scale Patient Assistance Program for patients that do not have  insurance, or whose insurance does not completely cover the cost of the ZIO monitor.  You must apply for the Patient Assistance Program to qualify for this discounted rate.  To apply, please call [employer] at [phone], select option 4, select option 2, ask to apply for  Patient Assistance Program. Kelly [name] will ask your household income, and how many people  are in your household. They will quote your out-of-pocket cost based on that information.  [employer] will also be able to set up a 84-month, interest-free payment plan if needed.  Applying the monitor   Shave hair from upper left chest.   Hold abrader disc by orange tab. Rub abrader in 40 strokes over the upper left chest as  indicated in your monitor instructions.  Clean area with 4 enclosed alcohol pads. Let dry.  Apply patch as indicated in monitor instructions. Patch will be placed under collarbone on left  side of chest with arrow pointing upward.  Rub patch adhesive wings for 2 minutes. Remove white label marked "1". Remove the white  label marked "2". Rub patch adhesive wings for 2 additional minutes.  While looking in a mirror, press and release button in center of patch. A small green light will  flash 3-4 times. This will be your only indicator that the monitor has been turned on.  Do not shower for the first 24 hours. You may shower after the first 24 hours.  Press the button if you feel a symptom. You will hear a small click. Record Date, Time and  Symptom in the Patient Logbook.  When you are ready to remove the patch, follow instructions on the last 2 pages of Patient  Logbook. Stick patch monitor onto the last page of Patient Logbook.  Place Patient Logbook in the blue and white box. Use locking tab on box and tape box closed  securely. The blue and white box has prepaid postage on it. Please place it in the mailbox as  soon as possible. Your physician should have your test results approximately 7 days after the  monitor has been mailed back to [facility]  Call [facility] Customer Care at [phone] if you have questions regarding  your ZIO XT patch monitor. Call them immediately if you see an orange light blinking on your  monitor.  If your monitor falls off in less than 4 days, contact our Monitor department at [phone].  If your monitor becomes loose or falls off after 4 days call [employer] at [phone] for  suggestions on securing your monitor

## 2023-02-20 NOTE — Progress Notes (Signed)
Cardiology Office Note:  .    Date:  02/20/2023  ID:  Kelly [name], DOB 17-Feb-1940, MRN [id number] PCP: Trisha Mangle, FNP  [facility] Providers Cardiologist:  Yates Decamp, MD     CC: Feeling weak, feeble Consulted for the evaluation of oHCM at the behest of Dr. Jacinto Halim   History of Present Illness: .    Kelly [name] is a 83 y.o. female oHCM (Severe gradient, Septal thickness of 20 mm, no CMR), PAF, HTN, HLD, COPD with ongoing tobacco use, excess alcohol use with PUD and UGIB  She experiences shortness of breath after walking for about five minutes, aiming to walk for ten minutes. No chest pain, chest pressure, or palpitations. She has not experienced any syncope or near-syncope episodes. She feels 'pretty good' today but mentions feeling feeble after falling out of bed last night.  She has severe sinus bradycardia and is currently on diltiazem and metoprolol, which she believes may be contributing to her feeling of weakness. Her heart rate is noted to be 47 bpm.  She has a history of COPD and recently stopped smoking with the help of nicotine patches and a vape provided by her grandson. She used to experience shortness of breath but no longer does. She is able to do a lot of work around the house but finds formal walking outside challenging, especially in cold weather.  She has paroxysmal atrial fibrillation and is currently taking Eliquis twice a day. She reports a history of bleeding issues in the past but states that her blood counts are improving.  She has iron deficiency anemia related to peptic ulcer disease and upper GI bleed. Her blood counts are reportedly improving.  Her family history includes heart disease, with her father having had a heart attack in his fifties and her brother attributing his health issues to [employer] exposure. Her son died in a backpacking accident, and she has a daughter, three grandsons, and a Physicist, medical.   Relevant  histories: .  Social - SCD hx NOS (son).  Daughter has Kelly [name]. ROS: As per HPI.   Studies Reviewed: .   Cardiac Studies & Procedures      ECHOCARDIOGRAM  ECHOCARDIOGRAM COMPLETE 12/13/2022  Narrative ECHOCARDIOGRAM REPORT    Patient Name:   Kelly [name] Date of Exam: 12/13/2022 Medical Rec #:  [id number]      Height:       62.0 in Accession #:    [id number]     Weight:       105.4 lb Date of Birth:  02-Mar-1940      BSA:          1.456 m Patient Age:    82 years       BP:           138/68 mmHg Patient Gender: F              HR:           99 bpm. Exam Location:  Inpatient  Procedure: 2D Echo, Color Doppler and Cardiac Doppler  Indications:    CHF-Acute Diastolic I50.31  History:        Patient has prior history of Echocardiogram examinations, most recent 02/28/2021. CHF, COPD; Signs/Symptoms:Murmur.  Sonographer:    Darlys Gales Referring Phys: [id number] [facility] GOEL  IMPRESSIONS   1. There is a severe dynamic LVOT obstruction from concentric hypertrophy and mitral valve SAM. Peak gradient with valsalva. Left ventricular ejection fraction, by estimation, is 65 to 70%.  The left ventricle has normal function. The left ventricle has no regional wall motion abnormalities. There is severe concentric left ventricular hypertrophy. Left ventricular diastolic parameters are consistent with Grade III diastolic dysfunction (restrictive). Elevated left atrial pressure. 2. Right ventricular systolic function is normal. The right ventricular size is normal. 3. Left atrial size was severely dilated. 4. The mitral valve is normal in structure. Moderate mitral valve regurgitation. 5. The aortic valve is normal in structure. Aortic valve regurgitation is not visualized.  FINDINGS Left Ventricle: There is a severe dynamic LVOT obstruction from concentric hypertrophy and mitral valve SAM. Peak gradient with valsalva. Left ventricular ejection fraction, by estimation, is 65 to  70%. The left ventricle has normal function. The left ventricle has no regional wall motion abnormalities. The left ventricular internal cavity size was small. There is severe concentric left ventricular hypertrophy. Left ventricular diastolic parameters are consistent with Grade III diastolic dysfunction (restrictive). Elevated left atrial pressure.  Right Ventricle: The right ventricular size is normal. No increase in right ventricular wall thickness. Right ventricular systolic function is normal.  Left Atrium: Left atrial size was severely dilated.  Right Atrium: Right atrial size was normal in size.  Pericardium: Trivial pericardial effusion is present.  Mitral Valve: The mitral valve is normal in structure. There is mild thickening of the mitral valve leaflet(s). Mild mitral annular calcification. Moderate mitral valve regurgitation.  Tricuspid Valve: The tricuspid valve is normal in structure. Tricuspid valve regurgitation is mild.  Aortic Valve: The aortic valve is normal in structure. Aortic valve regurgitation is not visualized.  Pulmonic Valve: The pulmonic valve was grossly normal. Pulmonic valve regurgitation is not visualized.  Aorta: The aortic root and ascending aorta are structurally normal, with no evidence of dilitation.  IAS/Shunts: No atrial level shunt detected by color flow Doppler.   LEFT VENTRICLE PLAX 2D LVIDd:         3.50 cm   Diastology LVIDs:         2.00 cm   LV e' medial:    6.74 cm/s LV PW:         1.40 cm   LV E/e' medial:  37.8 LV IVS:        1.80 cm   LV e' lateral:   13.10 cm/s LVOT diam:     1.80 cm   LV E/e' lateral: 19.5 LVOT Area:     2.54 cm   LEFT ATRIUM             Index        RIGHT ATRIUM           Index LA Vol (A2C):   88.5 ml 60.78 ml/m  RA Area:     12.60 cm LA Vol (A4C):   42.4 ml 29.12 ml/m  RA Volume:   23.50 ml  16.14 ml/m LA Biplane Vol: 65.4 ml 44.92 ml/m  AORTA Ao Root diam: 3.30 cm Ao Asc diam:  3.50 cm  MITRAL  VALVE MV Area (PHT): 3.54 cm     SHUNTS MV Decel Time: 214 msec     Systemic Diam: 1.80 cm MV E velocity: 255.00 cm/s  Clearnce Hasten Electronically signed by Clearnce Hasten Signature Date/Time: 12/13/2022/9:44:34 AM    Final              Physical Exam:    VS:  BP (!) 118/56 (BP Location: Right Arm)   Pulse (!) 47   Ht 5\' 1"  (1.549 m)   Wt 106 lb  9.6 oz (48.4 kg)   SpO2 94%   BMI 20.14 kg/m    Wt Readings from Last 3 Encounters:  02/20/23 106 lb 9.6 oz (48.4 kg)  01/19/23 101 lb 9.6 oz (46.1 kg)  12/18/22 113 lb 1.5 oz (51.3 kg)    Gen: no distress, thin female   Neck: No JVD, no carotid bruit today Cardiac: No Rubs or Gallops, Harsh resting systolic  murmur, marked bradycardia radial pulses Respiratory: Clear to auscultation bilaterally, normal effort, normal  respiratory rate GI: Soft, nontender, non-distended  MS: No  edema;  moves all extremities Integument: Skin feels warm Neuro:  At time of evaluation, alert and oriented to person/place/time/situation  Psych: Normal affect, patient feels ok   ASSESSMENT AND PLAN: .    Hypertrophic Obstructive Cardiomyopathy NYHA III [age]-year-old with severe obstructive hypertrophic cardiomyopathy. Symptoms include dyspnea after five minutes of walking and weakness. Current treatment includes metoprolol and diltiazem, contributing to sinus bradycardia (HR 47). Discussed risks and benefits of stopping metoprolol to improve symptoms. Patient prefers conservative management and is not interested in surgical options. Discussed alternative treatments, including novel medications and procedures if symptoms persist. Potential need for Mavacamten if conservative measures fail. - Stop metoprolol - Continue diltiazem - Evaluate symptoms in April - Discuss potential use of Mavacamten if symptoms persist - no prior CMR; she is greater than 80, we will get likely defer CMR at this time unless NSVT  Sinus Bradycardia Marked  sinus bradycardia likely secondary to metoprolol and diltiazem. Heart rate is 47 bpm. Discussed potential improvement by stopping metoprolol. - ziopatch 2 week non live  Paroxysmal Atrial Fibrillation Paroxysmal atrial fibrillation managed with Eliquis. No recent episodes of palpitations or syncope. - Continue Eliquis - CHADVASC NA - continue diltiazem  Family History of Sudden Cardiac Death Family history includes a son who died under unclear circumstances and a brother with a myocardial infarction. Discussed potential genetic component of hypertrophic cardiomyopathy and importance of family screening. Explained options for genetic testing and echocardiograms. - Refer to Dr. Jomarie Longs for family screening options - Discussed genetic testing or echocardiogram for family members and gave education; this has also been uploaded to her phone  Iron Deficiency Anemia Iron deficiency anemia related to peptic ulcer disease. Blood counts are improving. - Order iron studies and CBC  Chronic Obstructive Pulmonary Disease (COPD) COPD with recent smoking cessation. Patient uses a nicotine patch and vape. Discussed importance of stopping vaping. - Order pulmonary function tests (PFTs)  General Health Maintenance Discussed importance of conservative management and avoiding invasive procedures. - Provide educational handouts on hypertrophic cardiomyopathy - Encourage continued cessation of smoking and vaping  Follow-up - Overbook appointment in April for CMI - Provide summary of visit and educational materials.  Time Spent Directly with Patient:   I have spent a total of 47 minutes with the patient reviewing notes, imaging, EKGs, labs,  and examining the patient as well as establishing an assessment and plan that was discussed personally with the patient. Discussed disease state education, using shared decision making tools and cardiac modeling, and discussing her genetic lineage. Reviewed care and  plan in collaboration with clinical genetics    Riley Lam, MD FASE [facility] Cardiologist [facility]  [address] [place], [address] [place], [place] [address] [phone]  10:41 AM

## 2023-02-20 NOTE — Progress Notes (Unsigned)
 Enrolled for [employer] to mail a ZIO XT long term holter monitor to the patients address on file.

## 2023-02-21 NOTE — Progress Notes (Cosign Needed)
Pre Test Genetic Consult  Referral Reason  Kelly [name], a new HCM patient, is referred for genetic consult and testing of hypertrophic cardiomyopathy.   Personal Medical Information Kelly [name] (II.61) is an 83 year old Caucasian female, who presented at the hospital with acute-onset of severe shortness of breath at 57. She was referred to Dr. Jacinto Halim and underwent cardiac imaging studies that detected cardiac wall thickness suggestive of HCM.   An recent echocardiogram of 12/13/2022 demonstrated severe dynamic LVOT obstruction with concentric hypertrophy (LVPW: 1.4 cm, IVS: 1.8 cm), LVEF of 65-70%, and peak gradient of Hg with Valsalva.   She reports being diagnosed with pneumonia 6 months ago. Currently, feels much better and has not needed oxygen since returning home from the hospital. She is mostly asymptomatic and denies chest discomfort, heart palpitations, or dizziness and syncope.   Traditional Risk Factors Patient has fluctuating blood pressure- not controlled previously with medication.  Family history  Relation to Proband Pedigree # Current age Heart condition/age of onset Notes  Daughter III.1 60 None Kelly [name] syndrome that masquerades as Kelly [name]  Son III.2 Deceased None Died in a hiking accident @ age 32 when he fell off a 300 feet cliff at [facility]. He was alone at the time and was on his way to meet up with friends further up the hiking trail. His body was found a week later with assistance from scenting dogs.  Grandchildren, 3 IV.1-IV.3 40, 37, 25 None IV.1- in group home @ [place]- mental illness IV.3- autistic, epilepsy        Brother III.2 66 M.I. @ 47   Sister III.3 76 None Colon cancer @ 40  Nephew, nieces IV.3-IV.9 50s- 38 None         Father I.1 Deceased M.I. @ 50s Died @ 7- colon cancer  Mother I.2 Deceased None Died @ 77-"wanted to die", was depressed, R-OHic and heavy smoker   Genetics Kelly [name] was counseled on the genetics of  hypertrophic cardiomyopathy (HCM). Patient was informed that this is an autosomal dominant condition with incomplete penetrance i.e. not all individuals harboring the HCM mutation will present clinically with HCM, and age-related penetrance where clinical presentation of HCM increases with advanced age. Variability in clinical expression is also seen in families with HCM with affected family members presenting clinically at different ages and with symptoms ranging from mild to severe.  Since HCM is an autosomal dominant condition, first degree-relatives are at a 50% risk of inheriting this condition. They should seek regular surveillance for HCM.  First-degree relatives include her daughter brother and sister. At this time her grandchildren, nephews and nieces do not need to undergo screening- they can be screened if they are symptomatic or if their parent is found to have HCM.    Clinical screening of first-degree relatives involves echocardiogram and EKG at regular intervals, frequency is typically determined by age, with children undergoing screening every year until the age of 23 and those over the age of 65 getting screened every 3-5 years until the age of 26. Patient verbalized understanding of this.  Genetic testing is a probabilistic test dependent upon age and severity of presentation, presence of risk factors for HCM and importantly family history of HCM or sudden death in first-degree relatives. The potential outcomes of genetic testing and subsequent management of at-risk family members is listed below-  If a mutation is not identified then it is important that he understands that HCM is a genetic condition and can be passed  down to his children. All first-degree relatives should undergo regular screening for HCM.  A negative test result can be due to limitations of the genetic test.   There is also the likelihood of identifying a "Variant of unknown significance". This result means that the  variant has not been detected in a statistically significant number of HCM patients and/or functional studies have not been performed to verify its pathogenicity. This VUS can be tested in the family to see if it segregates with disease. If a VUS is found, first-degree relatives should undergo regular clinical screening for HCM, but genetic testing for the VUS is otherwise not warranted.  If a pathogenic variant is reported, then first-degree family members can get tested for this variant. If they test positive, it is likely they will develop HCM. In light of variable expression and incomplete penetrance associated with HCM, it is not possible to predict when they will manifest clinically with HCM. It is recommended that family members that test positive for the familial pathogenic variant pursue clinical screening for HCM. Family members that test negative for the familial mutation need not pursue periodic screening for HCM, but seek care if symptoms develop.   Impression  Kelly [name] was found to have cardiac wall thickness suggestive of HCM at age 37 in the absence of other cardiac loading conditions that can lead to cardiac hypertrophy. There is no family history of HCM or sudden death. It is likely she has a de novo mutation for HCM or has inherited this from one of her parents with evident reduced penetrance.  In addition, patient should be aware of protections afforded by the Genetic Information Non-Discrimination Act (GINA). GINA protects a patient from losing their employment or health insurance based on their genotype. However, these protections do not cover life insurance and disability. Explained to the patient that family members that are found to have the familial genetic mutation will be denied life insurance even if they are asymptomatic and do not exhibit clinical signs of HCM. She verbalized understanding and will discuss this with her children.   Please note that the patient has not been  counseled in this visit on other personal, cultural or ethical issues that she may face due to her heart condition.   Plan After a thorough discussion of the risk and benefits of genetic testing for HCM, Mikah declines genetic testing. Recommend echocardiograms for siblings and daughter to assess heart health. Discussed the importance of genetic testing if thickening of the heart is found. Patient is advised to ensure her daughter gets an echocardiogram. She will discuss HCM screening guidelines and procuring life insurance with her daughter.   Sidney Ace, Ph.D, [facility] Clinical Molecular Geneticist

## 2023-02-25 ENCOUNTER — Other Ambulatory Visit: Payer: Self-pay | Admitting: Cardiology

## 2023-02-25 DIAGNOSIS — I480 Paroxysmal atrial fibrillation: Secondary | ICD-10-CM

## 2023-02-25 DIAGNOSIS — I421 Obstructive hypertrophic cardiomyopathy: Secondary | ICD-10-CM

## 2023-03-01 DIAGNOSIS — R001 Bradycardia, unspecified: Secondary | ICD-10-CM | POA: Diagnosis not present

## 2023-03-01 DIAGNOSIS — I421 Obstructive hypertrophic cardiomyopathy: Secondary | ICD-10-CM | POA: Diagnosis not present

## 2023-03-01 DIAGNOSIS — I480 Paroxysmal atrial fibrillation: Secondary | ICD-10-CM

## 2023-03-08 LAB — CBC
Hematocrit: 30.1 % — ABNORMAL LOW (ref 34.0–46.6)
Hemoglobin: 10 g/dL — ABNORMAL LOW (ref 11.1–15.9)
MCH: 31.7 pg (ref 26.6–33.0)
MCHC: 33.2 g/dL (ref 31.5–35.7)
MCV: 96 fL (ref 79–97)
Platelets: 186 10*3/uL (ref 150–450)
RBC: 3.15 x10E6/uL — ABNORMAL LOW (ref 3.77–5.28)
RDW: 12.8 % (ref 11.7–15.4)
WBC: 4.4 10*3/uL (ref 3.4–10.8)

## 2023-03-08 LAB — IRON,TIBC AND FERRITIN PANEL
Ferritin: 43 ng/mL (ref 15–150)
Iron Saturation: 12 % — ABNORMAL LOW (ref 15–55)
Iron: 43 ug/dL (ref 27–139)
Total Iron Binding Capacity: 367 ug/dL (ref 250–450)
UIBC: 324 ug/dL (ref 118–369)

## 2023-03-09 ENCOUNTER — Other Ambulatory Visit: Payer: Self-pay | Admitting: Pharmacist

## 2023-03-14 ENCOUNTER — Encounter: Payer: Self-pay | Admitting: Gastroenterology

## 2023-03-14 ENCOUNTER — Other Ambulatory Visit: Payer: Self-pay | Admitting: Gastroenterology

## 2023-03-14 ENCOUNTER — Other Ambulatory Visit (INDEPENDENT_AMBULATORY_CARE_PROVIDER_SITE_OTHER): Payer: Medicare Other

## 2023-03-14 ENCOUNTER — Ambulatory Visit (INDEPENDENT_AMBULATORY_CARE_PROVIDER_SITE_OTHER): Payer: Medicare Other | Admitting: Gastroenterology

## 2023-03-14 ENCOUNTER — Other Ambulatory Visit: Payer: Self-pay | Admitting: *Deleted

## 2023-03-14 VITALS — BP 114/80 | HR 74 | Ht 61.0 in | Wt 104.4 lb

## 2023-03-14 DIAGNOSIS — K2960 Other gastritis without bleeding: Secondary | ICD-10-CM | POA: Diagnosis not present

## 2023-03-14 DIAGNOSIS — Z7901 Long term (current) use of anticoagulants: Secondary | ICD-10-CM

## 2023-03-14 DIAGNOSIS — Z8719 Personal history of other diseases of the digestive system: Secondary | ICD-10-CM

## 2023-03-14 DIAGNOSIS — K265 Chronic or unspecified duodenal ulcer with perforation: Secondary | ICD-10-CM

## 2023-03-14 DIAGNOSIS — I480 Paroxysmal atrial fibrillation: Secondary | ICD-10-CM | POA: Diagnosis not present

## 2023-03-14 DIAGNOSIS — D509 Iron deficiency anemia, unspecified: Secondary | ICD-10-CM

## 2023-03-14 DIAGNOSIS — I421 Obstructive hypertrophic cardiomyopathy: Secondary | ICD-10-CM | POA: Diagnosis not present

## 2023-03-14 DIAGNOSIS — E538 Deficiency of other specified B group vitamins: Secondary | ICD-10-CM

## 2023-03-14 DIAGNOSIS — Z87891 Personal history of nicotine dependence: Secondary | ICD-10-CM

## 2023-03-14 DIAGNOSIS — J431 Panlobular emphysema: Secondary | ICD-10-CM

## 2023-03-14 LAB — COMPREHENSIVE METABOLIC PANEL
ALT: 12 U/L (ref 0–35)
AST: 28 U/L (ref 0–37)
Albumin: 4.3 g/dL (ref 3.5–5.2)
Alkaline Phosphatase: 47 U/L (ref 39–117)
BUN: 22 mg/dL (ref 6–23)
CO2: 24 meq/L (ref 19–32)
Calcium: 9.3 mg/dL (ref 8.4–10.5)
Chloride: 105 meq/L (ref 96–112)
Creatinine, Ser: 0.96 mg/dL (ref 0.40–1.20)
GFR: 54.87 mL/min — ABNORMAL LOW (ref >60.00)
Glucose, Bld: 100 mg/dL — ABNORMAL HIGH (ref 70–99)
Potassium: 4.5 meq/L (ref 3.5–5.1)
Sodium: 138 meq/L (ref 135–145)
Total Bilirubin: 0.4 mg/dL (ref 0.2–1.2)
Total Protein: 6.9 g/dL (ref 6.0–8.3)

## 2023-03-14 LAB — CBC WITH DIFFERENTIAL/PLATELET
Basophils Absolute: 0.1 10*3/uL (ref 0.0–0.1)
Basophils Relative: 1.1 % (ref 0.0–3.0)
Eosinophils Absolute: 0 10*3/uL (ref 0.0–0.7)
Eosinophils Relative: 1 % (ref 0.0–5.0)
HCT: 29.8 % — ABNORMAL LOW (ref 36.0–46.0)
Hemoglobin: 9.9 g/dL — ABNORMAL LOW (ref 12.0–15.0)
Lymphocytes Relative: 17.3 % (ref 12.0–46.0)
Lymphs Abs: 0.9 10*3/uL (ref 0.7–4.0)
MCHC: 33.2 g/dL (ref 30.0–36.0)
MCV: 95.1 fl (ref 78.0–100.0)
Monocytes Absolute: 0.5 10*3/uL (ref 0.1–1.0)
Monocytes Relative: 10 % (ref 3.0–12.0)
Neutro Abs: 3.6 10*3/uL (ref 1.4–7.7)
Neutrophils Relative %: 70.6 % (ref 43.0–77.0)
Platelets: 190 10*3/uL (ref 150.0–400.0)
RBC: 3.13 Mil/uL — ABNORMAL LOW (ref 3.87–5.11)
RDW: 14.1 % (ref 11.5–15.5)
WBC: 5.1 10*3/uL (ref 4.0–10.5)

## 2023-03-14 LAB — IBC + FERRITIN
Ferritin: 24.3 ng/mL (ref 10.0–291.0)
Iron: 119 ug/dL (ref 42–145)
Saturation Ratios: 30.9 % (ref 20.0–50.0)
TIBC: 385 ug/dL (ref 250.0–450.0)
Transferrin: 275 mg/dL (ref 212.0–360.0)

## 2023-03-14 LAB — B12 AND FOLATE PANEL
Folate: >25.2 ng/mL (ref >5.9)
Vitamin B-12: 288 pg/mL (ref 211–911)

## 2023-03-14 MED ORDER — CYANOCOBALAMIN 1000 MCG/ML IJ SOLN: INTRAMUSCULAR | 1 refills | Status: DC

## 2023-03-14 MED ORDER — CYANOCOBALAMIN 1000 MCG/ML IJ SOLN
1000.0000 ug | INTRAMUSCULAR | Status: AC
Start: 2023-03-14 — End: ?
  Administered 2023-03-23 – 2023-04-13 (×4): 1000 ug via INTRAMUSCULAR

## 2023-03-14 NOTE — Progress Notes (Signed)
 Chief Complaint: Primary GI MD:  HPI:  83 y.o. 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  Patient has longstanding history of intermittent IDA ongoing since at least 2018.  Patient is a previous patient of Dr. Christella Hartigan seen December 2018.  She has a history of perforated ulcer and acute abdominal pain 05/2016 which led to emergency laparotomy.  Patient admitted September 2023 with epigastric/right upper pain and symptomatic anemia with Hgb 8.5 and alcohol use of a bottle of wine over the course of 3 days.  She underwent EGD which showed erosive gastritis and she was put on PPI twice daily  Of note, upon review of care everywhere it appears patient was hospitalized January 2024 at [facility] and underwent EGD 02/08/2022 for heme positive stool which showed nonobstructing Schatzki ring, gastritis, 2 cm hiatal hernia, nonbleeding duodenal ulcers with clean ulcer base, no specimens collected.  Over the last 2 years appears patient's baseline hemoglobin has been ranging from 7-9.  May 2022 was 10.8  12/14/2022: Hgb 9.0, MCV 104.9, RDW 17.6.  Iron 24, TIBC 46 12/18/2022: Hgb 8.2, MCV 101.1, RDW 14.9.  CKD with BUN 29, creatinine 1.23, GFR 44 03/07/2023: Hgb 10.0, MCV 96, RDW 12.8.  Iron 43, ferritin 43, iron saturation 12%  ----------------TODAY------------------  Patient states she is unsure why she is here today.  She is aware that she has a longstanding history of anemia.  She has been taking oral iron with response but has been set up for iron infusions starting in March.  She continues to drink a bottle of wine over the course of 3 days.  Denies GERD.  Denies NSAID use.  She is on Eliquis.  She does report shortness of breath on exertion and is currently undergoing a 30-day heart monitor with her cardiologist which ends tomorrow.  She is also being seen by pulmonology  for this as well.  Denies weight loss, change in bowel habits, rectal bleeding, melena.  She would prefer to avoid procedures if possible.   PREVIOUS GI WORKUP   Echocardiogram 11/2022 with EF 65 to 70%  EGD 09/21/2021 inpatient for upper GI bleed - Z- line regular, 38 cm from the incisors.  - No gross lesions in esophagus.  - Small hiatal hernia.  - Erosive gastritis with hemorrhage. Biopsied.  - Normal examined duodenum.  07/2012 colonoscopy.  Performed by MD in [place].  4 mm polyp removed from hepatic flexure.  Erosions in sigmoid and rectum consistent with colitis.  Descending, sigmoid diverticulosis. 05/2016 UGI series.  In [place].  For follow-up repair perforated duodenal ulcer, postop day 4.  No evidence for leak, intact proximal small bowel.  Past Medical History:  Diagnosis Date   Anemia, iron deficiency 08/15/2016   Anxiety disorder, unspecified 07/13/2016   Arthritis    B12 deficiency 05/2015   B12 was 184   Cancer (HCC)    CHF (congestive heart failure) (HCC)    Chronic obstructive pulmonary disease (HCC) 07/13/2016   Chronic pain disorder 07/17/2016   COVID-19 virus infection 12/12/2021   Crohn's disease, small intestine (HCC) 02/19/2009   Depression 10/2013   chronic recurrent major depressive disorder   Dysrhythmia    GERD (gastroesophageal reflux disease) 02/28/2021   H/O vitamin D deficiency 08/04/2008   Heart murmur    HOCM (hypertrophic obstructive cardiomyopathy) (HCC) 12/19/2022   Hypertension    Hypothyroidism  Hypothyroidism, unspecified 09/26/2016   Long term (current) use of anticoagulants 12/19/2022   Mixed hyperlipidemia 04/23/2020   Nonrheumatic mitral valve regurgitation 12/19/2022   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   Paroxysmal atrial fibrillation (HCC) 08/13/2020   Pneumonia    Primary hypertension 04/23/2020   PUD (peptic ulcer disease) 07/13/2016   Recurrent dislocation of hip, right    Tobacco use disorder 07/13/2016    Past  Surgical History:  Procedure Laterality Date   BIOPSY  09/22/2021   Procedure: BIOPSY;  Surgeon: Napoleon Form, MD;  Location: [facility];  Service: Gastroenterology;;   CYSTOSCOPY WITH RETROGRADE PYELOGRAM, URETEROSCOPY AND STENT PLACEMENT Left 02/17/2021   Procedure: CYSTOSCOPY WITH  RETROGRADE PYELOGRAM, LEFT URETEROSCOPY , BIOPSY AND tumor ablation  STENT PLACEMENT;  Surgeon: Sebastian Ache, MD;  Location: [facility];  Service: Urology;  Laterality: Left;   ESOPHAGOGASTRODUODENOSCOPY (EGD) WITH PROPOFOL N/A 09/22/2021   Procedure: ESOPHAGOGASTRODUODENOSCOPY (EGD) WITH PROPOFOL;  Surgeon: Napoleon Form, MD;  Location: [facility];  Service: Gastroenterology;  Laterality: N/A;   HOLMIUM LASER APPLICATION Left 02/17/2021   Procedure: HOLMIUM LASER APPLICATION;  Surgeon: Sebastian Ache, MD;  Location: [facility];  Service: Urology;  Laterality: Left;   JOINT REPLACEMENT     Right hip, 2015   LAPAROSCOPY  06/03/2016   Duodenal ulcer repair   TONSILLECTOMY     TUBAL LIGATION      Current Outpatient Medications  Medication Sig Dispense Refill   albuterol (VENTOLIN HFA) 108 (90 Base) MCG/ACT inhaler Inhale 2 puffs into the lungs every 6 (six) hours as needed for wheezing or shortness of breath. 8 g 6   Artificial Tears ophthalmic solution Place 1 drop into both eyes 3 (three) times daily as needed (for dryness).     CARTIA XT 240 MG 24 hr capsule TAKE 1 CAPSULE(240 MG) BY MOUTH DAILY 90 capsule 3   Cholecalciferol (VITAMIN D3) 50 MCG (2000 UT) TABS Take 2,000 Units by mouth daily.     diltiazem (CARDIZEM CD) 180 MG 24 hr capsule Take 1 capsule (180 mg total) by mouth daily. 30 capsule 0   DULoxetine (CYMBALTA) 60 MG capsule Take 60 mg by mouth daily.     ELIQUIS 2.5 MG TABS tablet TAKE 1 TABLET(2.5 MG) BY MOUTH TWICE DAILY 180 tablet 1   EPINEPHrine 0.3 mg/0.3 mL IJ SOAJ injection Inject 0.3 mg into the muscle as needed for anaphylaxis.     ferrous sulfate 325 (65 FE) MG EC tablet  Take 1 tablet (325 mg total) by mouth daily with breakfast. 90 tablet 1   Fluticasone-Umeclidin-Vilant (TRELEGY ELLIPTA) 100-62.5-25 MCG/ACT AEPB Inhale 1 puff into the lungs daily. 1 each 11   folic acid (FOLVITE) 1 MG tablet Take 1 mg by mouth daily.     levothyroxine (SYNTHROID) 25 MCG tablet Take by mouth daily at 6 (six) AM.     nicotine (NICODERM CQ - DOSED IN MG/24 HOURS) 21 mg/24hr patch Place 1 patch (21 mg total) onto the skin daily. 28 patch 0   ondansetron (ZOFRAN) 4 MG tablet Take 4 mg by mouth every 8 (eight) hours as needed for nausea.     OXYGEN Inhale 1-3 L/min into the lungs as needed (shortness of breath).     pantoprazole (PROTONIX) 40 MG tablet Take 40 mg by mouth daily.     pregabalin (LYRICA) 75 MG capsule Take 75 mg by mouth 3 (three) times daily.     rosuvastatin (CRESTOR) 40 MG tablet Take 40 mg by mouth daily.  TYLENOL 325 MG tablet Take 975 mg by mouth every 8 (eight) hours as needed for headache or moderate pain (pain score 4-6).     carboxymethylcellulose (REFRESH PLUS) 0.5 % SOLN Place 1 drop into both eyes 3 (three) times daily as needed (dry eyes). (Patient not taking: Reported on 03/14/2023)     furosemide (LASIX) 20 MG tablet Take 1 tablet (20 mg total) by mouth daily as needed (for weight gain of 3 pounds.). (Patient not taking: Reported on 03/14/2023) 30 tablet 0   mirtazapine (REMERON) 7.5 MG tablet Take 7.5 mg by mouth at bedtime. (Patient not taking: Reported on 02/20/2023)     Spacer/Aero-Holding Chambers (VORTEX VALVED HOLDING CHAMBER) DEVI by Does not apply route. (Patient not taking: Reported on 03/14/2023)     SPIRIVA HANDIHALER 18 MCG inhalation capsule Place 18 mcg into inhaler and inhale daily as needed (for flares). (Patient not taking: Reported on 03/14/2023)     No current facility-administered medications for this visit.    Allergies as of 03/14/2023 - Review Complete 03/14/2023  Allergen Reaction Noted   Bee venom Anaphylaxis 07/13/2016    Sulfamethoxazole-trimethoprim Diarrhea, Nausea And Vomiting, and Other (See Comments) 07/13/2016   Trazodone Other (See Comments) 12/24/2021    Family History  Problem Relation Age of Onset   Heart disease Mother    Alcohol abuse Mother    Colon cancer Sister    Breast cancer Sister    Heart attack Brother    Cancer Neg Hx    Diabetes Neg Hx     Social History   Socioeconomic History   Marital status: Married    Spouse name: Not on file   Number of children: 1   Years of education: Not on file   Highest education level: Not on file  Occupational History   Not on file  Tobacco Use   Smoking status: Former    Current packs/day: 0.00    Average packs/day: 1 pack/day for 58.0 years (58.0 ttl pk-yrs)    Types: Cigarettes    Start date: 01/18/1964    Quit date: 01/17/2022    Years since quitting: 1.1   Smokeless tobacco: Never   Tobacco comments:    trying to quit, wearing nicotine patch. Cautioned against smoking and wearing patch.1/4 ppd 03/18/21  Vaping Use   Vaping status: Never Used  Substance and Sexual Activity   Alcohol use: Yes    Comment: h/o heavy use, not currently   Drug use: No   Sexual activity: Not Currently  Other Topics Concern   Not on file  Social History Narrative   2 children 1 deceased   Social Drivers of Corporate investment banker Strain: Low Risk  (09/01/2021)   [facility]   Overall Financial Resource Strain (CARDIA)    Difficulty of Paying Living Expenses: Not hard at all  Food Insecurity: Low Risk  (12/20/2022)   Received from [facility]   Hunger Vital Sign    Worried About Running Out of Food in the Last Year: Never true    Ran Out of Food in the Last Year: Never true  Transportation Needs: Unmet Transportation Needs (12/20/2022)   Received from [employer]    In the past 12 months, has lack of reliable transportation kept you from medical appointments,  meetings, work or from getting things needed for daily living? : Yes  Physical Activity: Not on file  Stress: Not on file  Social Connections: Not on file  Intimate Partner Violence: Not At Risk (12/12/2022)   Humiliation, Afraid, Rape, and Kick questionnaire    Fear of Current or Ex-Partner: No    Emotionally Abused: No    Physically Abused: No    Sexually Abused: No    Review of Systems:    Constitutional: No weight loss, fever, chills, weakness or fatigue HEENT: Eyes: No change in vision               Ears, Nose, Throat:  No change in hearing or congestion Skin: No rash or itching Cardiovascular: No chest pain, chest pressure or palpitations   Respiratory: No SOB or cough Gastrointestinal: See HPI and otherwise negative Genitourinary: No dysuria or change in urinary frequency Neurological: No headache, dizziness or syncope Musculoskeletal: No new muscle or joint pain Hematologic: No bleeding or bruising Psychiatric: No history of depression or anxiety    Physical Exam:  Vital signs: BP 114/80   Pulse 74   Ht 5\' 1"  (1.549 m)   Wt 104 lb 6 oz (47.3 kg)   BMI 19.72 kg/m   Constitutional: Frail, elderly, difficulty getting on exam table Head:  Normocephalic and atraumatic. Eyes:   PEERL, EOMI. No icterus. Conjunctiva pink. Respiratory: Significantly diminished breath sounds bilaterally  cardiovascular:  Regular rate and rhythm. No peripheral edema, cyanosis or pallor.  Gastrointestinal:  Soft, nondistended, nontender. No rebound or guarding. Normal bowel sounds. No appreciable masses or hepatomegaly. Rectal:  Not performed.  Msk:  Symmetrical without gross deformities. Without edema, no deformity or joint abnormality.  Neurologic:  Alert and  oriented x4;  grossly normal neurologically.  Skin:   Dry and intact without significant lesions or rashes. Psychiatric: Oriented to person, place and time. Demonstrates good judgement and reason without abnormal affect or  behaviors.   RELEVANT LABS AND IMAGING: CBC    Component Value Date/Time   WBC 4.4 03/07/2023 1133   WBC 7.0 12/18/2022 0741   RBC 3.15 (L) 03/07/2023 1133   RBC 2.67 (L) 12/18/2022 0741   HGB 10.0 (L) 03/07/2023 1133   HCT 30.1 (L) 03/07/2023 1133   PLT 186 03/07/2023 1133   MCV 96 03/07/2023 1133   MCH 31.7 03/07/2023 1133   MCH 30.7 12/18/2022 0741   MCHC 33.2 03/07/2023 1133   MCHC 30.4 12/18/2022 0741   RDW 12.8 03/07/2023 1133   LYMPHSABS 0.7 12/12/2022 1453   MONOABS 0.5 12/12/2022 1453   EOSABS 0.0 12/12/2022 1453   BASOSABS 0.0 12/12/2022 1453    CMP     Component Value Date/Time   NA 137 12/18/2022 0741   K 4.0 12/18/2022 0741   CL 108 12/18/2022 0741   CO2 24 12/18/2022 0741   GLUCOSE 106 (H) 12/18/2022 0741   BUN 22 12/18/2022 0741   CREATININE 0.82 12/19/2022 0409   CALCIUM 8.3 (L) 12/18/2022 0741   PROT 6.5 12/12/2022 2306   ALBUMIN 3.6 12/12/2022 2306   AST 25 12/12/2022 2306   ALT 17 12/12/2022 2306   ALKPHOS 51 12/12/2022 2306   BILITOT 0.3 12/12/2022 2306   GFRNONAA >60 12/19/2022 0409     Assessment/Plan:   Iron deficiency anemia History of erosive gastritis History of duodenal ulcer perforation s/p emergent laparotomy Longstanding IDA ongoing for many years controlled on iron with EGD in 2023 and most recent EGD 01/2022 more showing gastritis, hiatal hernia, nonbleeding duodenal ulcers with clean ulcer base.  Suspect her anemia is multifactorial.  There is obviously chronic GI blood loss resulting  in iron deficiency which is complicated by her Eliquis use.  Though I do suspect there may be a B12 deficiency as well since it appears her anemia is macrocytic and she does have significant alcohol use.  Patient would prefer to avoid endoscopic procedures if possible. Due to her multiple comorbidities, age, and frail state I think it is reasonable to manage conservatively at this time especially since anemia has improved and there is no overt bleeding.   - CBC, CMP, iron studies + ferritin, B12/folate - Continue iron supplementation - Continue PPI twice daily - We can hold off on procedures for now as hemoglobin appears stable and improved compared to previous draws.  However if patient has worsening anemia or overt bleeding may need to consider EGD (likely to be done at hospital with respiratory status). - Continue to follow with cardiology for heart monitor - Will discuss with Dr. Lavon Paganini any further recommendations  Severe HOCM Afib On Eliquis and currently undergoing heart monitor.  Dyspnea on exertion  Emphysema Diminished lung sounds on exam.  Following with pulmonology   Lara Mulch [facility] 03/14/2023, 12:04 PM  Cc: Trisha Mangle, *

## 2023-03-14 NOTE — Patient Instructions (Signed)
 Please go to the lab in the basement of our building to have lab work done as you leave today. Hit "B" for basement when you get on the elevator.  When the doors open the lab is on your left.  We will call you with the results. Thank you.  Follow up as needed.  Thank you for trusting me with your gastrointestinal care!   Boone Master, PA   If your blood pressure at your visit was 140/90 or greater, please contact your primary care physician to follow up on this. ______________________________________________________  If you are age 16 or older, your body mass index should be between 23-30. Your Body mass index is 19.72 kg/m. If this is out of the aforementioned range listed, please consider follow up with your Primary Care Provider.  If you are age 83 or younger, your body mass index should be between 19-25. Your Body mass index is 19.72 kg/m. If this is out of the aformentioned range listed, please consider follow up with your Primary Care Provider.  ________________________________________________________  The [place] [facility] providers would like to encourage you to use [facility] to communicate with providers for non-urgent requests or questions.  Due to long hold times on the telephone, sending your provider a message by [facility] may be a faster and more efficient way to get a response.  Please allow 48 business hours for a response.  Please remember that this is for non-urgent requests.  _______________________________________________________  Due to recent changes in healthcare laws, you may see the results of your imaging and laboratory studies on MyChart before your provider has had a chance to review them.  We understand that in some cases there may be results that are confusing or concerning to you. Not all laboratory results come back in the same time frame and the provider may be waiting for multiple results in order to interpret others.  Please give [place] 48 hours in order for your  provider to thoroughly review all the results before contacting the office for clarification of your results.

## 2023-03-21 ENCOUNTER — Ambulatory Visit (HOSPITAL_BASED_OUTPATIENT_CLINIC_OR_DEPARTMENT_OTHER): Payer: Medicare Other | Admitting: Internal Medicine

## 2023-03-21 DIAGNOSIS — Z8241 Family history of sudden cardiac death: Secondary | ICD-10-CM

## 2023-03-21 DIAGNOSIS — J449 Chronic obstructive pulmonary disease, unspecified: Secondary | ICD-10-CM

## 2023-03-21 DIAGNOSIS — I480 Paroxysmal atrial fibrillation: Secondary | ICD-10-CM

## 2023-03-21 DIAGNOSIS — I421 Obstructive hypertrophic cardiomyopathy: Secondary | ICD-10-CM

## 2023-03-21 DIAGNOSIS — R001 Bradycardia, unspecified: Secondary | ICD-10-CM

## 2023-03-21 DIAGNOSIS — R531 Weakness: Secondary | ICD-10-CM

## 2023-03-21 LAB — PULMONARY FUNCTION TEST
DL/VA % pred: 101 %
DL/VA: 4.18 ml/min/mmHg/L
DLCO unc % pred: 59 %
DLCO unc: 10.3 ml/min/mmHg
FEF 25-75 Post: 0.99 L/s
FEF 25-75 Pre: 0.76 L/s
FEF2575-%Change-Post: 29 %
FEF2575-%Pred-Post: 86 %
FEF2575-%Pred-Pre: 66 %
FEV1-%Change-Post: 7 %
FEV1-%Pred-Post: 65 %
FEV1-%Pred-Pre: 61 %
FEV1-Post: 1.09 L
FEV1-Pre: 1.01 L
FEV1FVC-%Change-Post: 3 %
FEV1FVC-%Pred-Pre: 101 %
FEV6-%Change-Post: 6 %
FEV6-%Pred-Post: 67 %
FEV6-%Pred-Pre: 63 %
FEV6-Post: 1.41 L
FEV6-Pre: 1.32 L
FEV6FVC-%Pred-Post: 106 %
FEV6FVC-%Pred-Pre: 106 %
FVC-%Change-Post: 3 %
FVC-%Pred-Post: 63 %
FVC-%Pred-Pre: 60 %
FVC-Post: 1.41 L
FVC-Pre: 1.36 L
Post FEV1/FVC ratio: 77 %
Post FEV6/FVC ratio: 100 %
Pre FEV1/FVC ratio: 74 %
Pre FEV6/FVC Ratio: 100 %
RV % pred: 106 %
RV: 2.5 L
TLC % pred: 82 %
TLC: 3.9 L

## 2023-03-21 NOTE — Progress Notes (Signed)
 Full pft performed today.

## 2023-03-21 NOTE — Patient Instructions (Signed)
 Full pft performed today.

## 2023-03-22 ENCOUNTER — Ambulatory Visit: Payer: Medicare Other

## 2023-03-22 VITALS — BP 96/64 | HR 60 | Temp 97.1°F | Resp 18 | Ht 62.0 in | Wt 105.8 lb

## 2023-03-22 DIAGNOSIS — D509 Iron deficiency anemia, unspecified: Secondary | ICD-10-CM

## 2023-03-22 DIAGNOSIS — D500 Iron deficiency anemia secondary to blood loss (chronic): Secondary | ICD-10-CM

## 2023-03-22 MED ORDER — ACETAMINOPHEN 325 MG PO TABS
650.0000 mg | ORAL_TABLET | Freq: Once | ORAL | Status: AC
  Administered 2023-03-22: 650 mg via ORAL
  Filled 2023-03-22: qty 2

## 2023-03-22 MED ORDER — IRON SUCROSE 20 MG/ML IV SOLN
200.0000 mg | Freq: Once | INTRAVENOUS | Status: AC
  Administered 2023-03-22: 200 mg via INTRAVENOUS
  Filled 2023-03-22: qty 10

## 2023-03-22 MED ORDER — DIPHENHYDRAMINE HCL 25 MG PO CAPS
25.0000 mg | ORAL_CAPSULE | Freq: Once | ORAL | Status: AC
  Administered 2023-03-22: 25 mg via ORAL
  Filled 2023-03-22: qty 1

## 2023-03-22 NOTE — Progress Notes (Signed)
 Diagnosis: Iron Deficiency Anemia  Provider:  Chilton Greathouse MD  Procedure: IV Push  IV Type: Peripheral, IV Location: L Forearm  Venofer (Iron Sucrose), Dose: 200 mg  Post Infusion IV Care: Observation period completed and Peripheral IV Discontinued  Discharge: Condition: Stable, Destination: Home . AVS Provided  Performed by:  Wyvonne Lenz, RN

## 2023-03-23 ENCOUNTER — Ambulatory Visit: Payer: Medicare Other

## 2023-03-23 DIAGNOSIS — E538 Deficiency of other specified B group vitamins: Secondary | ICD-10-CM | POA: Diagnosis not present

## 2023-03-23 NOTE — Progress Notes (Signed)
 Administered week 1 of 4 weekly B12 injections.  Patient has appts for the next 3 weeks

## 2023-03-24 ENCOUNTER — Ambulatory Visit: Payer: Medicare Other | Admitting: *Deleted

## 2023-03-24 VITALS — BP 126/63 | HR 63 | Temp 97.7°F | Resp 16 | Ht 62.0 in | Wt 107.4 lb

## 2023-03-24 DIAGNOSIS — D509 Iron deficiency anemia, unspecified: Secondary | ICD-10-CM

## 2023-03-24 DIAGNOSIS — D500 Iron deficiency anemia secondary to blood loss (chronic): Secondary | ICD-10-CM

## 2023-03-24 MED ORDER — IRON SUCROSE 20 MG/ML IV SOLN
200.0000 mg | Freq: Once | INTRAVENOUS | Status: AC
Start: 2023-03-24 — End: 2023-03-24
  Administered 2023-03-24: 200 mg via INTRAVENOUS
  Filled 2023-03-24: qty 10

## 2023-03-24 MED ORDER — ACETAMINOPHEN 325 MG PO TABS
650.0000 mg | ORAL_TABLET | Freq: Once | ORAL | Status: AC
Start: 2023-03-24 — End: 2023-03-24
  Administered 2023-03-24: 650 mg via ORAL
  Filled 2023-03-24: qty 2

## 2023-03-24 MED ORDER — DIPHENHYDRAMINE HCL 25 MG PO CAPS
25.0000 mg | ORAL_CAPSULE | Freq: Once | ORAL | Status: AC
  Administered 2023-03-24: 25 mg via ORAL
  Filled 2023-03-24: qty 1

## 2023-03-24 NOTE — Progress Notes (Deleted)
.  chiin

## 2023-03-24 NOTE — Progress Notes (Signed)
 Diagnosis: Iron Deficiency Anemia  Provider:  Chilton Greathouse MD  Procedure: IV Push  IV Type: Peripheral, IV Location: R Forearm  Venofer (Iron Sucrose), Dose: 200 mg  Post Infusion IV Care: Observation period completed and Peripheral IV Discontinued  Discharge: Condition: Good, Destination: Home . AVS Declined  Performed by:  Leonides Schanz, RN

## 2023-03-27 ENCOUNTER — Ambulatory Visit: Payer: Medicare Other

## 2023-03-27 VITALS — BP 126/63 | HR 76 | Temp 97.7°F | Resp 16 | Ht 62.0 in | Wt 103.0 lb

## 2023-03-27 DIAGNOSIS — D500 Iron deficiency anemia secondary to blood loss (chronic): Secondary | ICD-10-CM

## 2023-03-27 DIAGNOSIS — D509 Iron deficiency anemia, unspecified: Secondary | ICD-10-CM | POA: Diagnosis not present

## 2023-03-27 MED ORDER — IRON SUCROSE 20 MG/ML IV SOLN
200.0000 mg | Freq: Once | INTRAVENOUS | Status: AC
Start: 2023-03-27 — End: 2023-03-27
  Administered 2023-03-27: 200 mg via INTRAVENOUS
  Filled 2023-03-27: qty 10

## 2023-03-27 MED ORDER — DIPHENHYDRAMINE HCL 25 MG PO CAPS
25.0000 mg | ORAL_CAPSULE | Freq: Once | ORAL | Status: AC
  Administered 2023-03-27: 25 mg via ORAL
  Filled 2023-03-27: qty 1

## 2023-03-27 MED ORDER — ACETAMINOPHEN 325 MG PO TABS
650.0000 mg | ORAL_TABLET | Freq: Once | ORAL | Status: AC
  Administered 2023-03-27: 650 mg via ORAL
  Filled 2023-03-27: qty 2

## 2023-03-27 NOTE — Progress Notes (Signed)
 Diagnosis: Iron Deficiency Anemia  Provider:  Chilton Greathouse MD  Procedure: IV Push  IV Type: Peripheral, IV Location: R Forearm  Venofer (Iron Sucrose), Dose: 200 mg  Post Infusion IV Care: Observation period completed and Peripheral IV Discontinued. 15 minute observation per patient request.  Discharge: Condition: Stable, Destination: Home . AVS Provided  Performed by:  Wyvonne Lenz, RN

## 2023-03-29 ENCOUNTER — Ambulatory Visit: Payer: Medicare Other

## 2023-03-29 VITALS — BP 126/65 | HR 83 | Temp 97.4°F | Resp 16 | Ht 62.0 in | Wt 102.2 lb

## 2023-03-29 DIAGNOSIS — D509 Iron deficiency anemia, unspecified: Secondary | ICD-10-CM

## 2023-03-29 DIAGNOSIS — D500 Iron deficiency anemia secondary to blood loss (chronic): Secondary | ICD-10-CM

## 2023-03-29 MED ORDER — IRON SUCROSE 20 MG/ML IV SOLN
200.0000 mg | Freq: Once | INTRAVENOUS | Status: AC
  Administered 2023-03-29: 200 mg via INTRAVENOUS
  Filled 2023-03-29: qty 10

## 2023-03-29 MED ORDER — ACETAMINOPHEN 325 MG PO TABS
650.0000 mg | ORAL_TABLET | Freq: Once | ORAL | Status: AC
  Administered 2023-03-29: 650 mg via ORAL
  Filled 2023-03-29: qty 2

## 2023-03-29 MED ORDER — DIPHENHYDRAMINE HCL 25 MG PO CAPS
25.0000 mg | ORAL_CAPSULE | Freq: Once | ORAL | Status: AC
  Administered 2023-03-29: 25 mg via ORAL
  Filled 2023-03-29: qty 1

## 2023-03-29 NOTE — Progress Notes (Signed)
 Diagnosis: Iron Deficiency Anemia  Provider:  Chilton Greathouse MD  Procedure: IV Push  IV Type: Peripheral, IV Location: R Forearm  Venofer (Iron Sucrose), Dose: 200 mg  Post Infusion IV Care: Observation period completed and Peripheral IV Discontinued. 15 minute observation per patient request.  Discharge: Condition: Stable, Destination: Home . AVS Declined  Performed by:  Wyvonne Lenz, RN

## 2023-03-30 ENCOUNTER — Ambulatory Visit

## 2023-03-30 DIAGNOSIS — E538 Deficiency of other specified B group vitamins: Secondary | ICD-10-CM

## 2023-03-31 ENCOUNTER — Ambulatory Visit: Payer: Medicare Other

## 2023-03-31 VITALS — BP 107/59 | HR 70 | Temp 97.6°F | Resp 20 | Ht 62.0 in | Wt 103.0 lb

## 2023-03-31 DIAGNOSIS — D509 Iron deficiency anemia, unspecified: Secondary | ICD-10-CM

## 2023-03-31 DIAGNOSIS — D500 Iron deficiency anemia secondary to blood loss (chronic): Secondary | ICD-10-CM

## 2023-03-31 MED ORDER — IRON SUCROSE 20 MG/ML IV SOLN
200.0000 mg | Freq: Once | INTRAVENOUS | Status: AC
  Administered 2023-03-31: 200 mg via INTRAVENOUS
  Filled 2023-03-31: qty 10

## 2023-03-31 MED ORDER — ACETAMINOPHEN 325 MG PO TABS
650.0000 mg | ORAL_TABLET | Freq: Once | ORAL | Status: AC
  Administered 2023-03-31: 650 mg via ORAL
  Filled 2023-03-31: qty 2

## 2023-03-31 MED ORDER — DIPHENHYDRAMINE HCL 25 MG PO CAPS
25.0000 mg | ORAL_CAPSULE | Freq: Once | ORAL | Status: AC
  Administered 2023-03-31: 25 mg via ORAL
  Filled 2023-03-31: qty 1

## 2023-03-31 NOTE — Progress Notes (Signed)
 Diagnosis: Iron Deficiency Anemia  Provider:  Chilton Greathouse MD  Procedure: IV Infusion  IV Type: Peripheral, IV Location: R Antecubital  Venofer (Iron Sucrose), Dose: 200 mg IVP.  Post Infusion IV Care: Observation period completed and Peripheral IV Discontinued  Discharge: Condition: Good, Destination: Home . AVS Provided  Performed by:  Garnette Czech, RN

## 2023-04-06 ENCOUNTER — Ambulatory Visit (INDEPENDENT_AMBULATORY_CARE_PROVIDER_SITE_OTHER)

## 2023-04-06 DIAGNOSIS — E538 Deficiency of other specified B group vitamins: Secondary | ICD-10-CM | POA: Diagnosis not present

## 2023-04-13 ENCOUNTER — Ambulatory Visit (INDEPENDENT_AMBULATORY_CARE_PROVIDER_SITE_OTHER)

## 2023-04-13 DIAGNOSIS — E538 Deficiency of other specified B group vitamins: Secondary | ICD-10-CM | POA: Diagnosis not present

## 2023-04-26 ENCOUNTER — Ambulatory Visit: Payer: Medicare Other | Attending: Internal Medicine | Admitting: Internal Medicine

## 2023-04-26 VITALS — BP 118/56 | HR 75 | Ht 62.0 in | Wt 104.3 lb

## 2023-04-26 DIAGNOSIS — J449 Chronic obstructive pulmonary disease, unspecified: Secondary | ICD-10-CM

## 2023-04-26 DIAGNOSIS — I421 Obstructive hypertrophic cardiomyopathy: Secondary | ICD-10-CM

## 2023-04-26 DIAGNOSIS — I340 Nonrheumatic mitral (valve) insufficiency: Secondary | ICD-10-CM

## 2023-04-26 DIAGNOSIS — I480 Paroxysmal atrial fibrillation: Secondary | ICD-10-CM

## 2023-04-26 NOTE — Patient Instructions (Signed)
 Medication Instructions:  Your physician recommends that you continue on your current medications as directed. Please refer to the Current Medication list given to you today.  *If you need a refill on your cardiac medications before your next appointment, please call your pharmacy*   Lab Work: NONE If you have labs (blood work) drawn today and your tests are completely normal, you will receive your results only by: MyChart Message (if you have MyChart) OR A paper copy in the mail If you have any lab test that is abnormal or we need to change your treatment, we will call you to review the results.   Testing/Procedures: NONE   Follow-Up: At [facility], you and your health needs are our priority.  As part of our continuing mission to provide you with exceptional heart care, we have created designated Provider Care Teams.  These Care Teams include your primary Cardiologist (physician) and Advanced Practice Providers (APPs -  Physician Assistants and Nurse Practitioners) who all work together to provide you with the care you need, when you need it.  We recommend signing up for the patient portal called "MyChart".  Sign up information is provided on this After Visit Summary.  MyChart is used to connect with patients for Virtual Visits (Telemedicine).  Patients are able to view lab/test results, encounter notes, upcoming appointments, etc.  Non-urgent messages can be sent to your provider as well.   To learn more about what you can do with MyChart, go to [email or web address].    Your next appointment:   1 year(s)  Provider:   Riley Lam, MD or Yates Decamp, MD    1st Floor: - Lobby - Registration  - Pharmacy  - Lab - Cafe  2nd Floor: - PV Lab - Diagnostic Testing (echo, CT, nuclear med)  3rd Floor: - Vacant  4th Floor: - TCTS (cardiothoracic surgery) - AFib Clinic - Structural Heart Clinic - Vascular Surgery  - Vascular Ultrasound  5th Floor: -  HeartCare Cardiology (general and EP) - Clinical Pharmacy for coumadin, hypertension, lipid, weight-loss medications, and med management appointments    Valet parking services will be available as well.

## 2023-04-26 NOTE — Progress Notes (Signed)
 Cardiology Office Note:  .    Date:  04/26/2023  ID:  Kelly [name], DOB [birth year], MRN [id number] PCP: Chilton Greathouse, MD  [facility] Providers Cardiologist:  Yates Decamp, MD     CC: Follow up HCM  History of Present Illness: .    Kelly [name] is an 83 year old female with hypertrophic obstructive cardiomyopathy who presents for a follow-up visit.  She feels 'wonderful' with her heart rate back to normal after previously experiencing bradycardia. She notes significant improvement in her energy levels and physical capabilities, stating she can walk anywhere she needs to and perform housework and cooking without getting out of breath. Her mood is generally good, and she feels as though she is 'not like I was twenty, but certainly like I'm [age].'  She has a history of arrhythmias that would come and go, but currently has no symptoms of shortness of breath, fatigue, chest pain, or chest pressure. She has been tolerating her blood thinner well with no more bleeding episodes. Her blood counts have normalized since the last visit.  She has successfully quit smoking, with her grandson providing her a vape to use when stressed, which occurs approximately every two days. She drinks wine with dinner, typically a four-ounce glass, sometimes two.  Family history includes some heart problems, though specific details are not provided. Her daughter has not been screened for hypertrophic cardiomyopathy, and her grandson, who is autistic and has epilepsy, has not undergone further cardiac evaluation due to his existing health challenges.   Relevant histories: .  Social - SCD hx NOS (son).  Daughter has Margaretha Sheffield. ROS: As per HPI.   Studies Reviewed: .   Cardiac Studies & Procedures   ______________________________________________________________________________________________   STRESS TESTS  PCV MYOCARDIAL PERFUSION WITH LEXISCAN 06/22/2020  Narrative Lexiscan Tetrofosmin stress  test 06/22/2020: Lexiscan nuclear stress test performed using 1-day protocol. Normal myocardial perfusion. Stress LVEF calculated 44%, but visually appears normal. Low risk study.   ECHOCARDIOGRAM  ECHOCARDIOGRAM COMPLETE 12/13/2022  Narrative ECHOCARDIOGRAM REPORT    Patient Name:   Kelly [name] Date of Exam: 12/13/2022 Medical Rec #:  [id number]      Height:       62.0 in Accession #:    [id number]     Weight:       105.4 lb Date of Birth:  01-01-1941      BSA:          1.456 m Patient Age:    82 years       BP:           138/68 mmHg Patient Gender: F              HR:           99 bpm. Exam Location:  Inpatient  Procedure: 2D Echo, Color Doppler and Cardiac Doppler  Indications:    CHF-Acute Diastolic I50.31  History:        Patient has prior history of Echocardiogram examinations, most recent 02/28/2021. CHF, COPD; Signs/Symptoms:Murmur.  Sonographer:    Darlys Gales Referring Phys: [id number] [facility] GOEL  IMPRESSIONS   1. There is a severe dynamic LVOT obstruction from concentric hypertrophy and mitral valve SAM. Peak gradient with valsalva. Left ventricular ejection fraction, by estimation, is 65 to 70%. The left ventricle has normal function. The left ventricle has no regional wall motion abnormalities. There is severe concentric left ventricular hypertrophy. Left ventricular diastolic parameters are consistent with Grade III diastolic dysfunction (restrictive). Elevated  left atrial pressure. 2. Right ventricular systolic function is normal. The right ventricular size is normal. 3. Left atrial size was severely dilated. 4. The mitral valve is normal in structure. Moderate mitral valve regurgitation. 5. The aortic valve is normal in structure. Aortic valve regurgitation is not visualized.  FINDINGS Left Ventricle: There is a severe dynamic LVOT obstruction from concentric hypertrophy and mitral valve SAM. Peak gradient with valsalva. Left ventricular ejection  fraction, by estimation, is 65 to 70%. The left ventricle has normal function. The left ventricle has no regional wall motion abnormalities. The left ventricular internal cavity size was small. There is severe concentric left ventricular hypertrophy. Left ventricular diastolic parameters are consistent with Grade III diastolic dysfunction (restrictive). Elevated left atrial pressure.  Right Ventricle: The right ventricular size is normal. No increase in right ventricular wall thickness. Right ventricular systolic function is normal.  Left Atrium: Left atrial size was severely dilated.  Right Atrium: Right atrial size was normal in size.  Pericardium: Trivial pericardial effusion is present.  Mitral Valve: The mitral valve is normal in structure. There is mild thickening of the mitral valve leaflet(s). Mild mitral annular calcification. Moderate mitral valve regurgitation.  Tricuspid Valve: The tricuspid valve is normal in structure. Tricuspid valve regurgitation is mild.  Aortic Valve: The aortic valve is normal in structure. Aortic valve regurgitation is not visualized.  Pulmonic Valve: The pulmonic valve was grossly normal. Pulmonic valve regurgitation is not visualized.  Aorta: The aortic root and ascending aorta are structurally normal, with no evidence of dilitation.  IAS/Shunts: No atrial level shunt detected by color flow Doppler.   LEFT VENTRICLE PLAX 2D LVIDd:         3.50 cm   Diastology LVIDs:         2.00 cm   LV e' medial:    6.74 cm/s LV PW:         1.40 cm   LV E/e' medial:  37.8 LV IVS:        1.80 cm   LV e' lateral:   13.10 cm/s LVOT diam:     1.80 cm   LV E/e' lateral: 19.5 LVOT Area:     2.54 cm   LEFT ATRIUM             Index        RIGHT ATRIUM           Index LA Vol (A2C):   88.5 ml 60.78 ml/m  RA Area:     12.60 cm LA Vol (A4C):   42.4 ml 29.12 ml/m  RA Volume:   23.50 ml  16.14 ml/m LA Biplane Vol: 65.4 ml 44.92 ml/m  AORTA Ao Root diam: 3.30  cm Ao Asc diam:  3.50 cm  MITRAL VALVE MV Area (PHT): 3.54 cm     SHUNTS MV Decel Time: 214 msec     Systemic Diam: 1.80 cm MV E velocity: 255.00 cm/s  Clearnce Hasten Electronically signed by Clearnce Hasten Signature Date/Time: 12/13/2022/9:44:34 AM    Final    MONITORS  LONG TERM MONITOR (3-14 DAYS) 03/21/2023  Narrative   Patient had a minimum heart rate of 63 bpm, maximum heart rate of 203 bpm, and average heart rate of 81 bpm. Predominant underlying rhythm was sinus rhythm. One short run of NSVT.  Low voltage acquisition. Frequent paroxysmal SVT.  Fastest 203 bpm. Isolated PACs were occasional (3%). Isolated PVCs were rare (<1.0%). Triggered and diary events associated with predominantly sinus rhythm and  sinus tachycardia .  Asymptomatic SVT.  Symptoms of shortness of breath may not be electrically driven.       ______________________________________________________________________________________________       Physical Exam:    VS:  BP (!) 118/56   Pulse 75   Ht 5\' 2"  (1.575 m)   Wt 47.3 kg   SpO2 95%   BMI 19.08 kg/m    Wt Readings from Last 3 Encounters:  04/26/23 47.3 kg  03/31/23 46.7 kg  03/29/23 46.4 kg    Gen: no distress, thin female   Neck: No JVD, no carotid bruit  Cardiac: No Rubs or Gallops, Harsh resting systolic  murmur, regular rhythm Respiratory: Clear to auscultation bilaterally, normal effort, normal  respiratory rate GI: Soft, nontender, non-distended  MS: No edema;  moves all extremities Integument: Skin feels warm Neuro:  At time of evaluation, alert and oriented to person/place/time/situation  Psych: Normal affect, patient feels great   ASSESSMENT AND PLAN: .    Hypertrophic Obstructive Cardiomyopathy (HOCM) - Hypertrophic obstructive cardiomyopathy with previous symptoms of fatigue and arrhythmias. Currently, she is energetic, performs daily activities without dyspnea, and has no chest pain or pressure. Her heart rate has  normalized after medication adjustment. She prefers to avoid medication unless necessary. - continue current medication (NYHA Class I, symptomatic bradycardia on prior AV nodal therapy) - Monitor for symptoms such as fatigue, dyspnea, or chest pain - if sx cannot be explain by PAF (currently in SR), UGI bleed (anemia resolved),  or COPD (she stopped smoking) I would re-offer her CMI therapy  Family Screening for Hypertrophic Cardiomyopathy Family history of SCD Her family has not been screened for hypertrophic cardiomyopathy. She has a daughter and grandsons, one of whom is autistic with epilepsy and brain damage. An echocardiogram is recommended; reviewed this at length with patient  Smoking Cessation She has successfully quit smoking and uses a vape occasionally when stressed, approximately every two days. This is a significant improvement in her smoking habits. - Continue to avoid smoking. - goal is to stop vapin  Hx of UGI bleed and IDA with ETOH Alcohol Consumption with hx of PAF She consumes a four-ounce glass of wine with dinner, sometimes two. She is aware of previous advice to stop alcohol consumption but chooses to continue enjoying wine with meals.  Re-discussed counseling  One year f/u with Dr. Jacinto Halim or myself unless considering CMI therapy  Time Spent Directly with Patient:   I have spent a total of 45 minutes with the patient reviewing notes, imaging, EKGs, labs,  and examining the patient as well as establishing an assessment and plan that was discussed personally with the patient. Reviewed her concerns about alcohol and SCD-family screening; we have offered her medical therapy for her disease   Riley Lam, MD FASE [facility] Cardiologist [facility]  [address] [place], [address] [place], [place] [address] [phone]  4:42 PM

## 2023-06-16 ENCOUNTER — Other Ambulatory Visit: Payer: Self-pay | Admitting: Cardiology

## 2023-06-16 NOTE — Telephone Encounter (Signed)
 Prescription refill request for Eliquis  received. Indication:afib Last office visit:4/25 Scr:0.96  2/25 Age: 83 Weight:47.3  kg  Prescription refilled

## 2023-08-17 ENCOUNTER — Telehealth: Payer: Self-pay | Admitting: *Deleted

## 2023-08-17 NOTE — Telephone Encounter (Signed)
   Pre-operative Risk Assessment    Patient Name: Kelly [name]  DOB: 01/11/1941 MRN: [id number]   Date of last office visit: 04/26/23 DR. CHANDRASEKHAR Date of next office visit: NONE  Request for Surgical Clearance    Procedure:  Dental Extraction - Amount of Teeth to be Pulled:  5 TEETH AND ALVEOPLASTY  Date of Surgery:  Clearance TBD                                Surgeon:  DR. BARI, DDS Surgeon's Group or Practice Name:  [facility] Phone number:  [phone] Fax number:  [phone]   Type of Clearance Requested:   - Medical  - Pharmacy:  Hold Apixaban  (Eliquis )     Type of Anesthesia:  Local    Additional requests/questions:    Bonney Niels Jest   08/17/2023, 5:21 PM

## 2023-08-22 NOTE — Telephone Encounter (Signed)
 Patient with diagnosis of atrial fibrillation on Eliquis  for anticoagulation.     Procedure:  Dental Extraction - Amount of Teeth to be Pulled:  5 TEETH AND ALVEOPLASTY   Date of Surgery:  Clearance TBD    CHA2DS2-VASc Score = 6   This indicates a 9.7% annual risk of stroke. The patient's score is based upon: CHF History: 1 HTN History: 1 Diabetes History: 0 Stroke History: 0 Vascular Disease History: 1 Age Score: 2 Gender Score: 1   CrCl 33 Platelet count 190   Patient has not had an Afib/aflutter ablation within the last 3 months or DCCV within the last 30 days  Patient does not require pre-op antibiotics for dental procedure.  Per office protocol, patient can hold Eliquis  for 3 days prior to procedure.   Patient will not need bridging with Lovenox  (enoxaparin ) around procedure.  **This guidance is not considered finalized until pre-operative APP has relayed final recommendations.**

## 2023-08-22 NOTE — Telephone Encounter (Signed)
 Left a detailed message for the patient to call back and schedule her for a tele visit. 1st attempt.

## 2023-08-22 NOTE — Telephone Encounter (Signed)
   Name: Kelly [name]  DOB: 11-10-1940  MRN: [id number]  Primary Cardiologist: Gordy Bergamo, MD  Preoperative team, please contact this patient and set up a phone call appointment for further preoperative risk assessment. Please obtain consent and complete medication review. Thank you for your help.  I confirm that guidance regarding antiplatelet and oral anticoagulation therapy has been completed and, if necessary, noted below.  Patient does not require pre-op antibiotics for dental procedure.   Per office protocol, patient can hold Eliquis  for 3 days prior to procedure.   Patient will not need bridging with Lovenox  (enoxaparin ) around procedure.  I also confirmed the patient resides in the state of [place] . As per [facility] Medical Board telemedicine laws, the patient must reside in the state in which the provider is licensed.  [name] D [name], NP 08/22/2023, 3:42 PM [facility]

## 2023-08-23 ENCOUNTER — Telehealth: Payer: Self-pay

## 2023-08-23 NOTE — Telephone Encounter (Signed)
 Preop tele appt now scheduled, med rec and consent done

## 2023-08-23 NOTE — Telephone Encounter (Signed)
  Patient Consent for Virtual Visit        Kelly [name] has provided verbal consent on 08/23/2023 for a virtual visit (video or telephone).   CONSENT FOR VIRTUAL VISIT FOR:  Kelly [name]  By participating in this virtual visit I agree to the following:  I hereby voluntarily request, consent and authorize [facility] and its employed or contracted physicians, physician assistants, nurse practitioners or other licensed health care professionals (the Practitioner), to provide me with telemedicine health care services (the "Services) as deemed necessary by the treating Practitioner. I acknowledge and consent to receive the Services by the Practitioner via telemedicine. I understand that the telemedicine visit will involve communicating with the Practitioner through live audiovisual communication technology and the disclosure of certain medical information by electronic transmission. I acknowledge that I have been given the opportunity to request an in-person assessment or other available alternative prior to the telemedicine visit and am voluntarily participating in the telemedicine visit.  I understand that I have the right to withhold or withdraw my consent to the use of telemedicine in the course of my care at any time, without affecting my right to future care or treatment, and that the Practitioner or I may terminate the telemedicine visit at any time. I understand that I have the right to inspect all information obtained and/or recorded in the course of the telemedicine visit and may receive copies of available information for a reasonable fee.  I understand that some of the potential risks of receiving the Services via telemedicine include:  Delay or interruption in medical evaluation due to technological equipment failure or disruption; Information transmitted may not be sufficient (e.g. poor resolution of images) to allow for appropriate medical decision making by the Practitioner;  and/or  In rare instances, security protocols could fail, causing a breach of personal health information.  Furthermore, I acknowledge that it is my responsibility to provide information about my medical history, conditions and care that is complete and accurate to the best of my ability. I acknowledge that Practitioner's advice, recommendations, and/or decision may be based on factors not within their control, such as incomplete or inaccurate data provided by me or distortions of diagnostic images or specimens that may result from electronic transmissions. I understand that the practice of medicine is not an exact science and that Practitioner makes no warranties or guarantees regarding treatment outcomes. I acknowledge that a copy of this consent can be made available to me via my patient portal [facility] MyChart), or I can request a printed copy by calling the office of [facility].    I understand that my insurance will be billed for this visit.   I have read or had this consent read to me. I understand the contents of this consent, which adequately explains the benefits and risks of the Services being provided via telemedicine.  I have been provided ample opportunity to ask questions regarding this consent and the Services and have had my questions answered to my satisfaction. I give my informed consent for the services to be provided through the use of telemedicine in my medical care

## 2023-08-30 ENCOUNTER — Ambulatory Visit: Attending: Cardiovascular Disease

## 2023-08-30 ENCOUNTER — Ambulatory Visit: Admitting: General Practice

## 2023-08-30 DIAGNOSIS — Z01810 Encounter for preprocedural cardiovascular examination: Secondary | ICD-10-CM

## 2023-08-30 NOTE — Progress Notes (Signed)
 Virtual Visit via Telephone Note   Because of [name] co-morbid illnesses, she is at least at moderate risk for complications without adequate follow up.  This format is felt to be most appropriate for this patient at this time.  Due to technical limitations with video connection (technology), today's appointment will be conducted as an audio only telehealth visit, and [name] verbally agreed to proceed in this manner.   All issues noted in this document were discussed and addressed.  No physical exam could be performed with this format.  Evaluation Performed:  Preoperative cardiovascular risk assessment _____________   Date:  08/30/2023   Patient ID:  Kelly [name], DOB 03-31-1940, MRN [id number] Patient Location:  Home Provider location:   Office  Primary Care Provider:  Mannam, Praveen, MD Primary Cardiologist:  Gordy Bergamo, MD  Chief Complaint / Patient Profile  83 y.o. y/o female with a h/o hypertrophic obstructive cardiomyopathy, paroxysmal atrial fibrillation, hypertension, hyperlipidemia, tobacco use with COPD, PUD with UGIB in 09/2021 who is pending dental extraction of 5 teeth and alveoplasty and presents today for telephonic preoperative cardiovascular risk assessment. History of Present Illness  Kelly [name] is a 83 y.o. female who presents via audio/video conferencing for a telehealth visit today.  Pt was last seen in cardiology clinic on 04/26/23 by Dr. Santo.  At that time Iver Fehrenbach was doing well.  The patient is now pending procedure as outlined above. Since her last visit, she has remained stable from a cardiac standpoint. Patient reports that she has been feeling, excellent and perfect with more energy than I have had in years. Today she denies chest pain, shortness of breath, lower extremity edema, fatigue, palpitations, melena, hematuria, hemoptysis, diaphoresis, weakness, presyncope, syncope, orthopnea, and PND. She is able to achieve greater  than 4 METs of activity.  Past Medical History    Past Medical History:  Diagnosis Date   Anemia, iron  deficiency 08/15/2016   Anxiety disorder, unspecified 07/13/2016   Arthritis    B12 deficiency 05/2015   B12 was 184   Cancer (HCC)    CHF (congestive heart failure) (HCC)    Chronic obstructive pulmonary disease (HCC) 07/13/2016   Chronic pain disorder 07/17/2016   COVID-19 virus infection 12/12/2021   Crohn's disease, small intestine (HCC) 02/19/2009   Depression 10/2013   chronic recurrent major depressive disorder   Dysrhythmia    GERD (gastroesophageal reflux disease) 02/28/2021   H/O vitamin D  deficiency 08/04/2008   Heart murmur    HOCM (hypertrophic obstructive cardiomyopathy) (HCC) 12/19/2022   Hypertension    Hypothyroidism    Hypothyroidism, unspecified 09/26/2016   Long term (current) use of anticoagulants 12/19/2022   Mixed hyperlipidemia 04/23/2020   Nonrheumatic mitral valve regurgitation 12/19/2022   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   Paroxysmal atrial fibrillation (HCC) 08/13/2020   Pneumonia    Primary hypertension 04/23/2020   PUD (peptic ulcer disease) 07/13/2016   Recurrent dislocation of hip, right    Tobacco use disorder 07/13/2016   Past Surgical History:  Procedure Laterality Date   BIOPSY  09/22/2021   Procedure: BIOPSY;  Surgeon: Shila Gustav GAILS, MD;  Location: [facility];  Service: Gastroenterology;;   CYSTOSCOPY WITH RETROGRADE PYELOGRAM, URETEROSCOPY AND STENT PLACEMENT Left 02/17/2021   Procedure: CYSTOSCOPY WITH  RETROGRADE PYELOGRAM, LEFT URETEROSCOPY , BIOPSY AND tumor ablation  STENT PLACEMENT;  Surgeon: Alvaro Hummer, MD;  Location: [facility];  Service: Urology;  Laterality: Left;   ESOPHAGOGASTRODUODENOSCOPY (EGD) WITH PROPOFOL  N/A 09/22/2021   Procedure: ESOPHAGOGASTRODUODENOSCOPY (EGD)  WITH PROPOFOL ;  Surgeon: Shila Gustav GAILS, MD;  Location: [facility] END[facility]COPY;  Service: Gastroenterology;  Laterality: N/A;   HOLMIUM LASER  APPLICATION Left 02/17/2021   Procedure: HOLMIUM LASER APPLICATION;  Surgeon: Alvaro Hummer, MD;  Location: [facility];  Service: Urology;  Laterality: Left;   JOINT REPLACEMENT     Right hip, 2015   LAPAROSCOPY  06/03/2016   Duodenal ulcer repair   TONSILLECTOMY     TUBAL LIGATION     Allergies Allergies  Allergen Reactions   Bee Venom Anaphylaxis   Sulfamethoxazole-Trimethoprim Diarrhea, Nausea And Vomiting and Other (See Comments)    GI Intolerance   Trazodone  Other (See Comments)    Severe weakness, unable to get up, and fatigue   Home Medications    Prior to Admission medications   Medication Sig Start Date End Date Taking? Authorizing Provider  albuterol  (VENTOLIN  HFA) 108 (90 Base) MCG/ACT inhaler Inhale 2 puffs into the lungs every 6 (six) hours as needed for wheezing or shortness of breath. 03/18/21   Gladis Leonor HERO, MD  Artificial Tears ophthalmic solution Place 1 drop into both eyes 3 (three) times daily as needed (for dryness).    [provider]  CARTIA  XT 240 MG 24 hr capsule TAKE 1 CAPSULE(240 MG) BY MOUTH DAILY 02/27/23   Ladona Heinz, MD  Cholecalciferol  (VITAMIN D3) 50 MCG (2000 UT) TABS Take 2,000 Units by mouth daily.    [provider]  cyanocobalamin  (VITAMIN B12) 1000 MCG/ML injection ADMINISTER 1 ML(1000 MCG) IN THE MUSCLE 1 TIME WEEKLY FOR 4 WEEKS THEN ADMINISTER 1 ML(1000 MCG) IN THE MUSCLE 1 TIME MONTHLY THEREAFTER 03/15/23   McMichael, Bayley M, PA-C  diltiazem  (CARDIZEM  CD) 180 MG 24 hr capsule Take 1 capsule (180 mg total) by mouth daily. 12/20/22   Regalado, Belkys A, MD  DULoxetine  (CYMBALTA ) 60 MG capsule Take 60 mg by mouth daily. 08/19/20   [provider]  ELIQUIS  2.5 MG TABS tablet TAKE 1 TABLET(2.5 MG) BY MOUTH TWICE DAILY 06/16/23   Ladona Heinz, MD  EPINEPHrine 0.3 mg/0.3 mL IJ SOAJ injection Inject 0.3 mg into the muscle as needed for anaphylaxis.    [provider]  ferrous sulfate  325 (65 FE) MG EC tablet Take 1  tablet (325 mg total) by mouth daily with breakfast. 08/02/16   [place], Betty G, MD  Fluticasone -Umeclidin-Vilant (TRELEGY ELLIPTA ) 100-62.5-25 MCG/ACT AEPB Inhale 1 puff into the lungs daily. 03/18/21   Gladis Leonor HERO, MD  folic acid  (FOLVITE ) 1 MG tablet Take 1 mg by mouth daily.    [provider]  furosemide  (LASIX ) 20 MG tablet Take 1 tablet (20 mg total) by mouth daily as needed (for weight gain of 3 pounds.). 12/19/22   Regalado, Owen A, MD  levothyroxine  (SYNTHROID ) 25 MCG tablet Take by mouth daily at 6 (six) AM. 12/28/22 12/28/23  [provider]  mirtazapine  (REMERON ) 7.5 MG tablet Take 7.5 mg by mouth at bedtime.    [provider]  nicotine  (NICODERM CQ  - DOSED IN MG/24 HOURS) 21 mg/24hr patch Place 1 patch (21 mg total) onto the skin daily. 06/04/20   Ladona Heinz, MD  ondansetron  (ZOFRAN ) 4 MG tablet Take 4 mg by mouth every 8 (eight) hours as needed for nausea.    [provider]  OXYGEN  Inhale 1-3 L/min into the lungs as needed (shortness of breath).    [provider]  pantoprazole  (PROTONIX ) 40 MG tablet Take 40 mg by mouth daily.    [provider]  pregabalin  (LYRICA ) 75 MG capsule Take 75 mg by mouth 3 (three) times daily.    [provider]  rosuvastatin  (CRESTOR ) 40 MG tablet Take 40 mg by mouth daily. 07/29/21   [provider]  Spacer/Aero-Holding Chambers (VORTEX VALVED HOLDING CHAMBER) DEVI by Does not apply route.    [provider]  SPIRIVA  HANDIHALER 18 MCG inhalation capsule Place 18 mcg into inhaler and inhale daily as needed (for flares).    [provider]  thiamine  (VITAMIN B1) 100 MG tablet Take 100 mg by mouth daily.    [provider]    Physical Exam  Vital Signs:  Valisa Karpel does not have vital signs available for review today. Given telephonic nature of communication, physical exam is limited. AAOx3. NAD. Normal affect.  Speech and respirations are  unlabored. Accessory Clinical Findings  None Assessment & Plan   1.  Preoperative Cardiovascular Risk Assessment: Ms. Sipe's perioperative risk of a major cardiac event is 0.9% according to the Revised Cardiac Risk Index (RCRI).  Therefore, she is at low risk for perioperative complications.   Her functional capacity is good at 6.05 METs according to the [facility] Activity Status Index (DASI). Recommendations: According to ACC/AHA guidelines, no further cardiovascular testing needed.  The patient may proceed to surgery at acceptable risk.   Antiplatelet and/or Anticoagulation Recommendations: Eliquis  (Apixaban ) can be held for 3 days prior to surgery.  Please resume post op when felt to be safe.     Patient does not require pre-op antibiotics for dental procedure   The patient was advised that if she develops new symptoms prior to surgery to contact our office to arrange for a follow-up visit, and she verbalized understanding.  A copy of this note will be routed to requesting surgeon.  Time:   Today, I have spent 10 minutes with the patient with telehealth technology discussing medical history, symptoms, and management plan.    Vy Badley D Dash Cardarelli, NP  08/30/2023, 4:07 PM

## 2023-11-07 ENCOUNTER — Ambulatory Visit (HOSPITAL_COMMUNITY)
Admission: RE | Admit: 2023-11-07 | Discharge: 2023-11-07 | Disposition: A | Discharge status: Home / Self Care | Source: Ambulatory Visit | Attending: Physician Assistant | Admitting: Physician Assistant

## 2023-11-07 DIAGNOSIS — I779 Disorder of arteries and arterioles, unspecified: Secondary | ICD-10-CM | POA: Diagnosis not present

## 2023-11-08 ENCOUNTER — Ambulatory Visit: Payer: Self-pay | Admitting: Cardiology

## 2023-11-08 NOTE — Progress Notes (Signed)
 Very minimal carotid artery disease by duplex 11/07/2023.  No further surveillance is indicated.
